# Patient Record
Sex: Female | Born: 1965
Health system: Southern US, Community
[De-identification: ages and names within clinical notes are randomized; demographics above are authoritative.]

## PROBLEM LIST (undated history)

## (undated) DIAGNOSIS — I639 Cerebral infarction, unspecified: Secondary | ICD-10-CM

## (undated) DIAGNOSIS — F419 Anxiety disorder, unspecified: Secondary | ICD-10-CM

## (undated) DIAGNOSIS — G5601 Carpal tunnel syndrome, right upper limb: Secondary | ICD-10-CM

## (undated) DIAGNOSIS — F32A Depression, unspecified: Secondary | ICD-10-CM

## (undated) DIAGNOSIS — G43909 Migraine, unspecified, not intractable, without status migrainosus: Secondary | ICD-10-CM

## (undated) DIAGNOSIS — E039 Hypothyroidism, unspecified: Secondary | ICD-10-CM

## (undated) DIAGNOSIS — F909 Attention-deficit hyperactivity disorder, unspecified type: Secondary | ICD-10-CM

## (undated) DIAGNOSIS — I10 Essential (primary) hypertension: Secondary | ICD-10-CM

## (undated) DIAGNOSIS — M479 Spondylosis, unspecified: Secondary | ICD-10-CM

## (undated) HISTORY — DX: Attention-deficit hyperactivity disorder, unspecified type: F90.9

## (undated) HISTORY — PX: TUBAL LIGATION: SHX77

## (undated) HISTORY — DX: Spondylosis, unspecified: M47.9

## (undated) HISTORY — DX: Anxiety disorder, unspecified: F41.9

## (undated) HISTORY — DX: Carpal tunnel syndrome, right upper limb: G56.01

## (undated) HISTORY — PX: APPENDECTOMY: SHX54

## (undated) HISTORY — DX: Migraine, unspecified, not intractable, without status migrainosus: G43.909

## (undated) HISTORY — DX: Essential (primary) hypertension: I10

## (undated) HISTORY — DX: Depression, unspecified: F32.A

## (undated) HISTORY — DX: Hypothyroidism, unspecified: E03.9

---

## 2006-08-13 ENCOUNTER — Emergency Department (HOSPITAL_COMMUNITY): Admission: EM | Admit: 2006-08-13 | Discharge: 2006-08-13 | Payer: Self-pay | Admitting: Emergency Medicine

## 2006-12-15 ENCOUNTER — Emergency Department (HOSPITAL_COMMUNITY): Admission: EM | Admit: 2006-12-15 | Discharge: 2006-12-15 | Payer: Self-pay | Admitting: Emergency Medicine

## 2008-01-12 ENCOUNTER — Other Ambulatory Visit: Admission: RE | Admit: 2008-01-12 | Discharge: 2008-01-12 | Payer: Self-pay | Admitting: Family Medicine

## 2008-01-21 ENCOUNTER — Encounter: Admission: RE | Admit: 2008-01-21 | Discharge: 2008-01-21 | Payer: Self-pay | Admitting: Family Medicine

## 2009-01-31 ENCOUNTER — Other Ambulatory Visit: Admission: RE | Admit: 2009-01-31 | Discharge: 2009-01-31 | Payer: Self-pay | Admitting: Emergency Medicine

## 2009-02-06 ENCOUNTER — Encounter: Admission: RE | Admit: 2009-02-06 | Discharge: 2009-02-06 | Payer: Self-pay | Admitting: Emergency Medicine

## 2009-02-12 ENCOUNTER — Encounter: Admission: RE | Admit: 2009-02-12 | Discharge: 2009-02-12 | Payer: Self-pay | Admitting: Emergency Medicine

## 2009-05-05 ENCOUNTER — Encounter: Admission: RE | Admit: 2009-05-05 | Discharge: 2009-05-05 | Payer: Self-pay | Admitting: Orthopedic Surgery

## 2009-06-28 ENCOUNTER — Ambulatory Visit (HOSPITAL_BASED_OUTPATIENT_CLINIC_OR_DEPARTMENT_OTHER): Admission: RE | Admit: 2009-06-28 | Discharge: 2009-06-28 | Payer: Self-pay | Admitting: Orthopedic Surgery

## 2010-01-27 ENCOUNTER — Encounter: Payer: Self-pay | Admitting: Emergency Medicine

## 2011-12-17 ENCOUNTER — Other Ambulatory Visit: Payer: Self-pay | Admitting: Family Medicine

## 2011-12-17 DIAGNOSIS — Z1231 Encounter for screening mammogram for malignant neoplasm of breast: Secondary | ICD-10-CM

## 2012-01-21 ENCOUNTER — Ambulatory Visit: Payer: Self-pay

## 2012-01-23 ENCOUNTER — Ambulatory Visit
Admission: RE | Admit: 2012-01-23 | Discharge: 2012-01-23 | Disposition: A | Discharge status: Home / Self Care | Payer: BC Managed Care – PPO | Source: Ambulatory Visit | Attending: Family Medicine | Admitting: Family Medicine

## 2012-01-23 DIAGNOSIS — Z1231 Encounter for screening mammogram for malignant neoplasm of breast: Secondary | ICD-10-CM

## 2012-02-12 ENCOUNTER — Other Ambulatory Visit: Payer: Self-pay | Admitting: Family Medicine

## 2012-02-12 ENCOUNTER — Other Ambulatory Visit (HOSPITAL_COMMUNITY)
Admission: RE | Admit: 2012-02-12 | Discharge: 2012-02-12 | Disposition: A | Discharge status: Home / Self Care | Payer: BC Managed Care – PPO | Source: Ambulatory Visit | Attending: Family Medicine | Admitting: Family Medicine

## 2012-02-12 DIAGNOSIS — Z1151 Encounter for screening for human papillomavirus (HPV): Secondary | ICD-10-CM | POA: Insufficient documentation

## 2012-02-12 DIAGNOSIS — Z124 Encounter for screening for malignant neoplasm of cervix: Secondary | ICD-10-CM | POA: Insufficient documentation

## 2013-05-23 ENCOUNTER — Other Ambulatory Visit: Payer: Self-pay | Admitting: Orthopaedic Surgery

## 2013-05-23 DIAGNOSIS — M545 Low back pain, unspecified: Secondary | ICD-10-CM

## 2013-05-28 ENCOUNTER — Ambulatory Visit
Admission: RE | Admit: 2013-05-28 | Discharge: 2013-05-28 | Disposition: A | Discharge status: Home / Self Care | Payer: BC Managed Care – PPO | Source: Ambulatory Visit | Attending: Orthopaedic Surgery | Admitting: Orthopaedic Surgery

## 2013-05-28 DIAGNOSIS — M545 Low back pain, unspecified: Secondary | ICD-10-CM

## 2013-06-06 ENCOUNTER — Other Ambulatory Visit: Payer: Self-pay | Admitting: Family Medicine

## 2013-06-06 ENCOUNTER — Ambulatory Visit
Admission: RE | Admit: 2013-06-06 | Discharge: 2013-06-06 | Disposition: A | Discharge status: Home / Self Care | Payer: BC Managed Care – PPO | Source: Ambulatory Visit | Attending: Family Medicine | Admitting: Family Medicine

## 2013-06-06 DIAGNOSIS — R109 Unspecified abdominal pain: Secondary | ICD-10-CM

## 2013-06-07 ENCOUNTER — Ambulatory Visit
Admission: RE | Admit: 2013-06-07 | Discharge: 2013-06-07 | Disposition: A | Discharge status: Home / Self Care | Payer: BC Managed Care – PPO | Source: Ambulatory Visit | Attending: Family Medicine | Admitting: Family Medicine

## 2013-06-07 DIAGNOSIS — R109 Unspecified abdominal pain: Secondary | ICD-10-CM

## 2013-06-07 MED ORDER — IOHEXOL 300 MG/ML  SOLN
100.0000 mL | Freq: Once | INTRAMUSCULAR | Status: AC | PRN
  Administered 2013-06-07: 100 mL via INTRAVENOUS

## 2013-08-15 LAB — HM COLONOSCOPY

## 2019-01-07 HISTORY — PX: ANTERIOR CRUCIATE LIGAMENT REPAIR: SHX115

## 2020-02-14 ENCOUNTER — Other Ambulatory Visit (HOSPITAL_COMMUNITY)
Admission: RE | Admit: 2020-02-14 | Discharge: 2020-02-14 | Disposition: A | Discharge status: Home / Self Care | Payer: BC Managed Care – PPO | Source: Ambulatory Visit | Attending: Family Medicine | Admitting: Family Medicine

## 2020-02-14 ENCOUNTER — Other Ambulatory Visit: Payer: Self-pay | Admitting: Family Medicine

## 2020-02-14 DIAGNOSIS — Z01411 Encounter for gynecological examination (general) (routine) with abnormal findings: Secondary | ICD-10-CM | POA: Insufficient documentation

## 2020-02-16 LAB — CYTOLOGY - PAP
Comment: NEGATIVE
Diagnosis: NEGATIVE
High risk HPV: NEGATIVE

## 2020-06-21 NOTE — Progress Notes (Signed)
Electrophysiology Office Note:    Date:  06/22/2020   ID:  Vicki Gonzales, DOB [birth year], MRN [id number]  PCP:  Vicki Bussing, MD  [facility] Cardiologist:  None  [facility] Electrophysiologist:  Lanier Prude, MD   Referring MD: Vicki Bussing, MD   Chief Complaint: Chest pressure  History of Present Illness:    Vicki Gonzales is a 55 y.o. female who presents for an evaluation of chest pressure at the request of Dr. Docia Chuck. Their medical history includes hypertension, ADHD, anxiety, depression.  Patient has a history of remote tobacco abuse that totals 20 pack years.  Her father had a myocardial infarction in his 64s and had what sounds ago defibrillator implanted after his MI.  Her mother has atrial fibrillation.  She tells me that recently she was hiking in [facility].  On the way up the mountain she had no symptoms whatsoever.  When they were descending and heading towards a waterfall it became very steep and she became very anxious.  In that setting she had a flushed feeling all over her body.  She says that she was read.  Unclear whether or not there was extensive chest pain but she does point to her chest when describing the symptoms.  The symptoms eventually resolved.  She is concerned that her symptoms could represent coronary disease given her family history and history of tobacco abuse.  Since the hiking trip she has not had another episode of chest discomfort or feeling red/hot all over.  She has been working on weight loss in an effort to help treat her blood pressure.  She is lost about 10 pounds and her blood pressure has decreased.      Past Medical History:  Diagnosis Date   ADHD    Anxiety    Carpal tunnel syndrome on right    Depression    HTN (hypertension)    Hypothyroidism    Migraines    Spondylosis    Current Medications: Current Meds  Medication Sig   levothyroxine (SYNTHROID) 50 MCG tablet Take 50 mcg by  mouth every morning.   lisinopril-hydrochlorothiazide (ZESTORETIC) 20-12.5 MG tablet Take 1 tablet by mouth daily.   PARoxetine (PAXIL) 10 MG tablet Take 10 mg by mouth daily.     Allergies:   Keflex [cephalexin]   Social History   Socioeconomic History   Marital status: Married    Spouse name: Not on file   Number of children: Not on file   Years of education: Not on file   Highest education level: Not on file  Occupational History   Not on file  Tobacco Use   Smoking status: Former    Pack years: 0.00   Smokeless tobacco: Never  Substance and Sexual Activity   Alcohol use: Not on file   Drug use: Not on file   Sexual activity: Not on file  Other Topics Concern   Not on file  Social History Narrative   Not on file   Social Determinants of Health   Financial Resource Strain: Not on file  Food Insecurity: Not on file  Transportation Needs: Not on file  Physical Activity: Not on file  Stress: Not on file  Social Connections: Not on file     Family History: The patient's family history is not on file.  ROS:   Please see the history of present illness.    All other systems reviewed and are negative.  EKGs/Labs/Other Studies Reviewed:  The following studies were reviewed today:   EKG:  The ekg ordered today demonstrates sinus rhythm.  Normal EKG.  Recent Labs: No results found for requested labs within last 8760 hours.  Recent Lipid Panel No results found for: CHOL, TRIG, HDL, CHOLHDL, VLDL, LDLCALC, LDLDIRECT  Physical Exam:    VS:  BP (!) 132/92   Pulse 73   Ht 5\' 6"  (1.676 m)   Wt 192 lb (87.1 kg)   BMI 30.99 kg/m     Wt Readings from Last 3 Encounters:  06/22/20 192 lb (87.1 kg)     GEN:  Well nourished, well developed in no acute distress HEENT: Normal NECK: No JVD; No carotid bruits LYMPHATICS: No lymphadenopathy CARDIAC: RRR, no murmurs, rubs, gallops RESPIRATORY:  Clear to auscultation without rales, wheezing or rhonchi  ABDOMEN:  Soft, non-tender, non-distended MUSCULOSKELETAL:  No edema; No deformity  SKIN: Warm and dry NEUROLOGIC:  Alert and oriented x 3 PSYCHIATRIC:  Normal affect   ASSESSMENT:    1. Chest pressure   2. History of tobacco abuse   3. Family history of coronary arteriosclerosis    PLAN:    In order of problems listed above:  Chest pressure and abnormal sensation with exertion Unclear if this represents atypical angina but given her family history and personal history of tobacco abuse I think it is reasonable to evaluate for coronary artery disease.  I would like to do a exercise nuclear stress test.  We will get this ordered and plan to follow-up in 4 to 6 weeks with one of the PA/NP's to review the results.  2.  Hypertension Controlled.  Continue lisinopril.  Management per primary care physician.  3.History of tobacco abuse Congratulated her on cessation.  Follow-up 6 to 8 weeks with NP/PA.  Medication Adjustments/Labs and Tests Ordered: Current medicines are reviewed at length with the patient today.  Concerns regarding medicines are outlined above.  Orders Placed This Encounter  Procedures   MYOCARDIAL PERFUSION IMAGING   EKG 12-Lead    No orders of the defined types were placed in this encounter.    Signed, 06/24/20. Rossie Muskrat, MD, [facility], [facility] 06/22/2020 9:19 AM    Electrophysiology [facility]

## 2020-06-22 ENCOUNTER — Other Ambulatory Visit: Payer: Self-pay

## 2020-06-22 ENCOUNTER — Encounter: Payer: Self-pay | Admitting: *Deleted

## 2020-06-22 ENCOUNTER — Ambulatory Visit: Payer: BC Managed Care – PPO | Admitting: Cardiology

## 2020-06-22 VITALS — BP 132/92 | HR 73 | Ht 66.0 in | Wt 192.0 lb

## 2020-06-22 DIAGNOSIS — Z8249 Family history of ischemic heart disease and other diseases of the circulatory system: Secondary | ICD-10-CM | POA: Diagnosis not present

## 2020-06-22 DIAGNOSIS — Z87891 Personal history of nicotine dependence: Secondary | ICD-10-CM | POA: Diagnosis not present

## 2020-06-22 DIAGNOSIS — R0789 Other chest pain: Secondary | ICD-10-CM

## 2020-06-22 NOTE — Patient Instructions (Signed)
Medication Instructions:  No changes *If you need a refill on your cardiac medications before your next appointment, please call your pharmacy*   Lab Work: none If you have labs (blood work) drawn today and your tests are completely normal, you will receive your results only by: MyChart Message (if you have MyChart) OR A paper copy in the mail If you have any lab test that is abnormal or we need to change your treatment, we will call you to review the results.   Testing/Procedures: Your physician has requested that you have en exercise stress myoview. For further information please visit [email or web address] Please follow instruction sheet, as given.  Follow-Up: At [facility], you and your health needs are our priority.  As part of our continuing mission to provide you with exceptional heart care, we have created designated Provider Care Teams.  These Care Teams include your primary Cardiologist (physician) and Advanced Practice Providers (APPs -  Physician Assistants and Nurse Practitioners) who all work together to provide you with the care you need, when you need it.  We recommend signing up for the patient portal called "MyChart".  Sign up information is provided on this After Visit Summary.  MyChart is used to connect with patients for Virtual Visits (Telemedicine).  Patients are able to view lab/test results, encounter notes, upcoming appointments, etc.  Non-urgent messages can be sent to your provider as well.   To learn more about what you can do with MyChart, go to [email or web address].    Your next appointment:   6-8week(s)  The format for your next appointment:   In Person  Provider:   You may see Lanier Prude, MD or one of the following Advanced Practice Providers on your designated Care Team:    Francis Dowse, [place] Casimiro Needle "Mardelle Matte" Lanna Poche, [place]   Other Instructions

## 2020-07-19 ENCOUNTER — Telehealth: Payer: Self-pay

## 2020-07-19 NOTE — Telephone Encounter (Signed)
Contacted the patient, she requested to reschedule her appointment. Her husband had back surgery and she's taking care of him. Her new appointment is August 14, 2020.

## 2020-07-24 ENCOUNTER — Encounter (HOSPITAL_COMMUNITY): Payer: BC Managed Care – PPO

## 2020-08-08 ENCOUNTER — Ambulatory Visit: Payer: BC Managed Care – PPO | Admitting: Student

## 2020-08-08 ENCOUNTER — Telehealth: Payer: Self-pay

## 2020-08-08 NOTE — Telephone Encounter (Signed)
Detailed instructions left on the patient's answering machine. Asked to call back with any questions. S.Carmine Youngberg EMTP 

## 2020-08-12 ENCOUNTER — Other Ambulatory Visit: Payer: Self-pay

## 2020-08-12 ENCOUNTER — Emergency Department (HOSPITAL_COMMUNITY): Payer: BC Managed Care – PPO | Admitting: Certified Registered Nurse Anesthetist

## 2020-08-12 ENCOUNTER — Encounter (HOSPITAL_COMMUNITY): Payer: Self-pay | Admitting: Anesthesiology

## 2020-08-12 ENCOUNTER — Encounter (HOSPITAL_COMMUNITY): Payer: Self-pay | Admitting: Certified Registered Nurse Anesthetist

## 2020-08-12 ENCOUNTER — Emergency Department
Admission: EM | Admit: 2020-08-12 | Discharge: 2020-08-12 | Disposition: A | Discharge status: Other Acute Inpatient Hospital | Payer: BC Managed Care – PPO | Attending: Emergency Medicine | Admitting: Emergency Medicine

## 2020-08-12 ENCOUNTER — Emergency Department: Payer: BC Managed Care – PPO

## 2020-08-12 ENCOUNTER — Inpatient Hospital Stay (HOSPITAL_COMMUNITY): Payer: BC Managed Care – PPO

## 2020-08-12 ENCOUNTER — Other Ambulatory Visit (HOSPITAL_COMMUNITY): Payer: BC Managed Care – PPO

## 2020-08-12 ENCOUNTER — Emergency Department (HOSPITAL_COMMUNITY): Payer: BC Managed Care – PPO

## 2020-08-12 ENCOUNTER — Encounter (HOSPITAL_COMMUNITY)
Admission: EM | Disposition: A | Discharge status: Rehabilitation Facility | Payer: Self-pay | Source: Home / Self Care | Attending: Neurology

## 2020-08-12 ENCOUNTER — Inpatient Hospital Stay (HOSPITAL_COMMUNITY): Admission: RE | Admit: 2020-08-12 | Payer: BC Managed Care – PPO | Source: Ambulatory Visit

## 2020-08-12 ENCOUNTER — Inpatient Hospital Stay (HOSPITAL_COMMUNITY)
Admission: EM | Admit: 2020-08-12 | Discharge: 2020-08-22 | DRG: 023 | Disposition: A | Discharge status: Rehabilitation Facility | Payer: BC Managed Care – PPO | Attending: Neurology | Admitting: Neurology

## 2020-08-12 DIAGNOSIS — I6521 Occlusion and stenosis of right carotid artery: Secondary | ICD-10-CM | POA: Diagnosis not present

## 2020-08-12 DIAGNOSIS — I63511 Cerebral infarction due to unspecified occlusion or stenosis of right middle cerebral artery: Secondary | ICD-10-CM | POA: Diagnosis not present

## 2020-08-12 DIAGNOSIS — I10 Essential (primary) hypertension: Secondary | ICD-10-CM | POA: Diagnosis present

## 2020-08-12 DIAGNOSIS — I361 Nonrheumatic tricuspid (valve) insufficiency: Secondary | ICD-10-CM | POA: Diagnosis not present

## 2020-08-12 DIAGNOSIS — Z87891 Personal history of nicotine dependence: Secondary | ICD-10-CM

## 2020-08-12 DIAGNOSIS — Z79899 Other long term (current) drug therapy: Secondary | ICD-10-CM

## 2020-08-12 DIAGNOSIS — Z9282 Status post administration of tPA (rtPA) in a different facility within the last 24 hours prior to admission to current facility: Secondary | ICD-10-CM | POA: Diagnosis not present

## 2020-08-12 DIAGNOSIS — Z8249 Family history of ischemic heart disease and other diseases of the circulatory system: Secondary | ICD-10-CM

## 2020-08-12 DIAGNOSIS — Z79890 Hormone replacement therapy (postmenopausal): Secondary | ICD-10-CM | POA: Diagnosis not present

## 2020-08-12 DIAGNOSIS — E876 Hypokalemia: Secondary | ICD-10-CM

## 2020-08-12 DIAGNOSIS — I63411 Cerebral infarction due to embolism of right middle cerebral artery: Secondary | ICD-10-CM | POA: Diagnosis present

## 2020-08-12 DIAGNOSIS — I951 Orthostatic hypotension: Secondary | ICD-10-CM | POA: Diagnosis not present

## 2020-08-12 DIAGNOSIS — R29810 Facial weakness: Secondary | ICD-10-CM | POA: Diagnosis present

## 2020-08-12 DIAGNOSIS — I63231 Cerebral infarction due to unspecified occlusion or stenosis of right carotid arteries: Secondary | ICD-10-CM | POA: Diagnosis present

## 2020-08-12 DIAGNOSIS — Z20822 Contact with and (suspected) exposure to covid-19: Secondary | ICD-10-CM | POA: Insufficient documentation

## 2020-08-12 DIAGNOSIS — E7800 Pure hypercholesterolemia, unspecified: Secondary | ICD-10-CM | POA: Diagnosis not present

## 2020-08-12 DIAGNOSIS — R29701 NIHSS score 1: Secondary | ICD-10-CM | POA: Diagnosis present

## 2020-08-12 DIAGNOSIS — R414 Neurologic neglect syndrome: Secondary | ICD-10-CM | POA: Diagnosis present

## 2020-08-12 DIAGNOSIS — I639 Cerebral infarction, unspecified: Secondary | ICD-10-CM | POA: Insufficient documentation

## 2020-08-12 DIAGNOSIS — Q211 Atrial septal defect: Secondary | ICD-10-CM

## 2020-08-12 DIAGNOSIS — T81718A Complication of other artery following a procedure, not elsewhere classified, initial encounter: Secondary | ICD-10-CM | POA: Diagnosis not present

## 2020-08-12 DIAGNOSIS — E785 Hyperlipidemia, unspecified: Secondary | ICD-10-CM | POA: Diagnosis present

## 2020-08-12 DIAGNOSIS — I609 Nontraumatic subarachnoid hemorrhage, unspecified: Secondary | ICD-10-CM | POA: Diagnosis not present

## 2020-08-12 DIAGNOSIS — E039 Hypothyroidism, unspecified: Secondary | ICD-10-CM | POA: Insufficient documentation

## 2020-08-12 DIAGNOSIS — Y92009 Unspecified place in unspecified non-institutional (private) residence as the place of occurrence of the external cause: Secondary | ICD-10-CM

## 2020-08-12 DIAGNOSIS — W06XXXA Fall from bed, initial encounter: Secondary | ICD-10-CM | POA: Diagnosis present

## 2020-08-12 DIAGNOSIS — R29708 NIHSS score 8: Secondary | ICD-10-CM | POA: Diagnosis not present

## 2020-08-12 DIAGNOSIS — I729 Aneurysm of unspecified site: Secondary | ICD-10-CM | POA: Diagnosis not present

## 2020-08-12 DIAGNOSIS — R471 Dysarthria and anarthria: Secondary | ICD-10-CM | POA: Diagnosis present

## 2020-08-12 DIAGNOSIS — E782 Mixed hyperlipidemia: Secondary | ICD-10-CM | POA: Diagnosis not present

## 2020-08-12 DIAGNOSIS — I724 Aneurysm of artery of lower extremity: Secondary | ICD-10-CM | POA: Diagnosis present

## 2020-08-12 DIAGNOSIS — K219 Gastro-esophageal reflux disease without esophagitis: Secondary | ICD-10-CM | POA: Diagnosis present

## 2020-08-12 DIAGNOSIS — Z823 Family history of stroke: Secondary | ICD-10-CM | POA: Diagnosis not present

## 2020-08-12 DIAGNOSIS — Y838 Other surgical procedures as the cause of abnormal reaction of the patient, or of later complication, without mention of misadventure at the time of the procedure: Secondary | ICD-10-CM | POA: Diagnosis not present

## 2020-08-12 DIAGNOSIS — R29710 NIHSS score 10: Secondary | ICD-10-CM | POA: Diagnosis not present

## 2020-08-12 DIAGNOSIS — R29705 NIHSS score 5: Secondary | ICD-10-CM | POA: Diagnosis not present

## 2020-08-12 DIAGNOSIS — Y900 Blood alcohol level of less than 20 mg/100 ml: Secondary | ICD-10-CM | POA: Diagnosis not present

## 2020-08-12 DIAGNOSIS — Z881 Allergy status to other antibiotic agents status: Secondary | ICD-10-CM | POA: Diagnosis not present

## 2020-08-12 DIAGNOSIS — Z9889 Other specified postprocedural states: Secondary | ICD-10-CM | POA: Diagnosis not present

## 2020-08-12 DIAGNOSIS — G8194 Hemiplegia, unspecified affecting left nondominant side: Secondary | ICD-10-CM | POA: Diagnosis present

## 2020-08-12 DIAGNOSIS — Q2112 Patent foramen ovale: Secondary | ICD-10-CM

## 2020-08-12 DIAGNOSIS — T82838A Hemorrhage of vascular prosthetic devices, implants and grafts, initial encounter: Secondary | ICD-10-CM | POA: Diagnosis not present

## 2020-08-12 DIAGNOSIS — G4733 Obstructive sleep apnea (adult) (pediatric): Secondary | ICD-10-CM | POA: Diagnosis not present

## 2020-08-12 LAB — COMPREHENSIVE METABOLIC PANEL
ALT: 22 U/L (ref 0–44)
AST: 21 U/L (ref 15–41)
Albumin: 4.3 g/dL (ref 3.5–5.0)
Alkaline Phosphatase: 70 U/L (ref 38–126)
Anion gap: 10 (ref 5–15)
BUN: 16 mg/dL (ref 6–20)
CO2: 27 mmol/L (ref 22–32)
Calcium: 9.4 mg/dL (ref 8.9–10.3)
Chloride: 99 mmol/L (ref 98–111)
Creatinine, Ser: 0.62 mg/dL (ref 0.44–1.00)
GFR, Estimated: >60 mL/min (ref >60)
Glucose, Bld: 107 mg/dL — ABNORMAL HIGH (ref 70–99)
Potassium: 3.8 mmol/L (ref 3.5–5.1)
Sodium: 136 mmol/L (ref 135–145)
Total Bilirubin: 1.3 mg/dL — ABNORMAL HIGH (ref 0.3–1.2)
Total Protein: 7.7 g/dL (ref 6.5–8.1)

## 2020-08-12 LAB — APTT: aPTT: 26 seconds (ref 24–36)

## 2020-08-12 LAB — URINE DRUG SCREEN, QUALITATIVE (ARMC ONLY)
Amphetamines, Ur Screen: NOT DETECTED
Barbiturates, Ur Screen: NOT DETECTED
Benzodiazepine, Ur Scrn: NOT DETECTED
Cannabinoid 50 Ng, Ur ~~LOC~~: NOT DETECTED
Cocaine Metabolite,Ur ~~LOC~~: NOT DETECTED
MDMA (Ecstasy)Ur Screen: NOT DETECTED
Methadone Scn, Ur: NOT DETECTED
Opiate, Ur Screen: NOT DETECTED
Phencyclidine (PCP) Ur S: NOT DETECTED
Tricyclic, Ur Screen: NOT DETECTED

## 2020-08-12 LAB — LIPID PANEL
Cholesterol: 253 mg/dL — ABNORMAL HIGH (ref 0–200)
HDL: 38 mg/dL — ABNORMAL LOW (ref >40)
LDL Cholesterol: 172 mg/dL — ABNORMAL HIGH (ref 0–99)
Total CHOL/HDL Ratio: 6.7 RATIO
Triglycerides: 217 mg/dL — ABNORMAL HIGH (ref <150)
VLDL: 43 mg/dL — ABNORMAL HIGH (ref 0–40)

## 2020-08-12 LAB — DIFFERENTIAL
Abs Immature Granulocytes: 0.03 10*3/uL (ref 0.00–0.07)
Basophils Absolute: 0.1 10*3/uL (ref 0.0–0.1)
Basophils Relative: 0 %
Eosinophils Absolute: 0.1 10*3/uL (ref 0.0–0.5)
Eosinophils Relative: 1 %
Immature Granulocytes: 0 %
Lymphocytes Relative: 19 %
Lymphs Abs: 2.2 10*3/uL (ref 0.7–4.0)
Monocytes Absolute: 0.7 10*3/uL (ref 0.1–1.0)
Monocytes Relative: 6 %
Neutro Abs: 8.8 10*3/uL — ABNORMAL HIGH (ref 1.7–7.7)
Neutrophils Relative %: 74 %

## 2020-08-12 LAB — URINALYSIS, ROUTINE W REFLEX MICROSCOPIC
Bilirubin Urine: NEGATIVE
Glucose, UA: NEGATIVE mg/dL
Hgb urine dipstick: NEGATIVE
Ketones, ur: NEGATIVE mg/dL
Leukocytes,Ua: NEGATIVE
Nitrite: NEGATIVE
Protein, ur: NEGATIVE mg/dL
Specific Gravity, Urine: 1.027 (ref 1.005–1.030)
pH: 7 (ref 5.0–8.0)

## 2020-08-12 LAB — RESP PANEL BY RT-PCR (FLU A&B, COVID) ARPGX2
Influenza A by PCR: NEGATIVE
Influenza B by PCR: NEGATIVE
SARS Coronavirus 2 by RT PCR: NEGATIVE

## 2020-08-12 LAB — FIBRINOGEN: Fibrinogen: 248 mg/dL (ref 210–475)

## 2020-08-12 LAB — CBC
HCT: 41.6 % (ref 36.0–46.0)
Hemoglobin: 13.9 g/dL (ref 12.0–15.0)
MCH: 29.1 pg (ref 26.0–34.0)
MCHC: 33.4 g/dL (ref 30.0–36.0)
MCV: 87 fL (ref 80.0–100.0)
Platelets: 336 10*3/uL (ref 150–400)
RBC: 4.78 MIL/uL (ref 3.87–5.11)
RDW: 14.2 % (ref 11.5–15.5)
WBC: 11.9 10*3/uL — ABNORMAL HIGH (ref 4.0–10.5)
nRBC: 0 % (ref 0.0–0.2)

## 2020-08-12 LAB — HEMOGLOBIN A1C
Hgb A1c MFr Bld: 6.2 % — ABNORMAL HIGH (ref 4.8–5.6)
Mean Plasma Glucose: 131.24 mg/dL

## 2020-08-12 LAB — PROTIME-INR
INR: 1 (ref 0.8–1.2)
Prothrombin Time: 13.6 seconds (ref 11.4–15.2)

## 2020-08-12 LAB — ETHANOL: Alcohol, Ethyl (B): <10 mg/dL (ref <10)

## 2020-08-12 LAB — HIV ANTIBODY (ROUTINE TESTING W REFLEX): HIV Screen 4th Generation wRfx: NONREACTIVE

## 2020-08-12 SURGERY — IR WITH ANESTHESIA
Anesthesia: General

## 2020-08-12 MED ORDER — ACETAMINOPHEN 650 MG RE SUPP: 650.0000 mg | RECTAL | Status: DC | PRN

## 2020-08-12 MED ORDER — LABETALOL HCL 5 MG/ML IV SOLN
20.0000 mg | Freq: Once | INTRAVENOUS | Status: AC
  Administered 2020-08-12: 20 mg via INTRAVENOUS

## 2020-08-12 MED ORDER — ALTEPLASE (STROKE) FULL DOSE INFUSION
0.9000 mg/kg | Freq: Once | INTRAVENOUS | Status: DC
  Filled 2020-08-12: qty 100

## 2020-08-12 MED ORDER — CLEVIDIPINE BUTYRATE 0.5 MG/ML IV EMUL
0.0000 mg/h | INTRAVENOUS | Status: DC
  Administered 2020-08-12: 1 mg/h via INTRAVENOUS
  Administered 2020-08-12: 3 mg/h via INTRAVENOUS
  Filled 2020-08-12: qty 50

## 2020-08-12 MED ORDER — SODIUM CHLORIDE 0.9 % IV SOLN: Freq: Once | INTRAVENOUS | Status: DC

## 2020-08-12 MED ORDER — IOHEXOL 300 MG/ML  SOLN: 50.0000 mL | Freq: Once | INTRAMUSCULAR | Status: DC | PRN

## 2020-08-12 MED ORDER — IOHEXOL 350 MG/ML SOLN
100.0000 mL | Freq: Once | INTRAVENOUS | Status: AC | PRN
  Administered 2020-08-12: 100 mL via INTRAVENOUS

## 2020-08-12 MED ORDER — SODIUM CHLORIDE 0.9 % IV SOLN: 50.0000 mL | Freq: Once | INTRAVENOUS | Status: DC

## 2020-08-12 MED ORDER — SENNOSIDES-DOCUSATE SODIUM 8.6-50 MG PO TABS
1.0000 | ORAL_TABLET | Freq: Two times a day (BID) | ORAL | Status: DC
  Administered 2020-08-15 – 2020-08-22 (×9): 1 via ORAL
  Filled 2020-08-12 (×12): qty 1

## 2020-08-12 MED ORDER — ALTEPLASE 100 MG IV SOLR
INTRAVENOUS | Status: AC
  Filled 2020-08-12: qty 100

## 2020-08-12 MED ORDER — ACETAMINOPHEN 160 MG/5ML PO SOLN: 650.0000 mg | ORAL | Status: DC | PRN

## 2020-08-12 MED ORDER — PAROXETINE HCL 10 MG PO TABS
10.0000 mg | ORAL_TABLET | Freq: Every day | ORAL | Status: DC
  Administered 2020-08-13 – 2020-08-22 (×7): 10 mg via ORAL
  Filled 2020-08-12 (×10): qty 1

## 2020-08-12 MED ORDER — STROKE: EARLY STAGES OF RECOVERY BOOK
Freq: Once | Status: DC
  Filled 2020-08-12: qty 1

## 2020-08-12 MED ORDER — ACETAMINOPHEN 325 MG PO TABS
650.0000 mg | ORAL_TABLET | ORAL | Status: DC | PRN
  Filled 2020-08-12: qty 2

## 2020-08-12 MED ORDER — LEVOTHYROXINE SODIUM 50 MCG PO TABS
50.0000 ug | ORAL_TABLET | Freq: Every day | ORAL | Status: DC
  Administered 2020-08-13 – 2020-08-22 (×10): 50 ug via ORAL
  Filled 2020-08-12 (×10): qty 1

## 2020-08-12 MED ORDER — PANTOPRAZOLE SODIUM 40 MG IV SOLR
40.0000 mg | Freq: Every day | INTRAVENOUS | Status: DC
  Administered 2020-08-12: 40 mg via INTRAVENOUS
  Filled 2020-08-12: qty 40

## 2020-08-12 NOTE — Anesthesia Preprocedure Evaluation (Signed)
Anesthesia Evaluation  Patient identified by MRN, date of birth, ID band Patient awake    Reviewed: Allergy & Precautions, H&P , NPO status , Patient's Chart, lab work & pertinent test results  Airway        Dental no notable dental hx.    Pulmonary neg pulmonary ROS, former smoker,    Pulmonary exam normal        Cardiovascular hypertension, Pt. on medications      Neuro/Psych  Headaches, Anxiety Depression CVA, Residual Symptoms    GI/Hepatic negative GI ROS, Neg liver ROS,   Endo/Other  Hypothyroidism   Renal/GU negative Renal ROS  negative genitourinary   Musculoskeletal  (+) Arthritis ,   Abdominal   Peds  Hematology negative hematology ROS (+)   Anesthesia Other Findings   Reproductive/Obstetrics negative OB ROS                             Anesthesia Physical Anesthesia Plan  ASA: 3 and emergent  Anesthesia Plan: General   Post-op Pain Management:    Induction: Intravenous, Rapid sequence and Cricoid pressure planned  PONV Risk Score and Plan: 3 and Ondansetron, Dexamethasone and Treatment may vary due to age or medical condition  Airway Management Planned: Oral ETT  Additional Equipment: Arterial line  Intra-op Plan:   Post-operative Plan: Possible Post-op intubation/ventilation and Extubation in OR  Informed Consent: I have reviewed the patients History and Physical, chart, labs and discussed the procedure including the risks, benefits and alternatives for the proposed anesthesia with the patient or authorized representative who has indicated his/her understanding and acceptance.     Dental advisory given  Plan Discussed with: CRNA  Anesthesia Plan Comments:         Anesthesia Quick Evaluation

## 2020-08-12 NOTE — Consult Note (Signed)
NEUROLOGY CONSULTATION NOTE   Date of service: August 12, 2020 Patient Name: Vicki Gonzales MRN:  [id number] DOB:  [birth year] Reason for consult: stroke code Requesting attending: Augusto Gamble MD _ _ _   _ __   _ __ _ _  __ __   _ __   __ _  History of Present Illness    55 yo woman with pmhx HTN, depression, migraines hypothyroidism BIB EMS after developing acute osnet of L facial droop and slurred speech at 0900 this AM. On my examination slurred speech had resolved and she had only L NLF flattening for NIHSS = 0. tPA was initially not administered 2/2 low NIHSS. CT head wo contrast showed hypodensity R insula and frontal operculum, ASPECTS 8, with equivocal hyperdensity of the R M1. CTA was performed which showed prox R ICA occlusion with intracranial reconstitution, finding suspicious for subocclusive embolus vs severe stenosis at the R MCA bifurcation, occlusion of L vert at origin with distal reconstitution, and CTP showing 25cc penumbra R MCA territory.  Based on CTA/CTP findings I called Dr. Corliss Skains who recommended administration of tPA and activation of Code IR. I discussed the risks/benefits of both tPA and thrombectomy with the patient, including 10% risk of hemorrhage with the latter, and she gave informed consent. She had no exclusion criteria for tPA. She did have a slip out of bed this morning and bumped her head on the dresser lightly but there was no LOC or other sx and no blood on CT head. Patient is not on anticoagulation.  ROS   Per HPI; all other systems reviewed and were negative  Past History   Past Medical History:  Diagnosis Date  . ADHD   . Anxiety   . Carpal tunnel syndrome on right   . Depression   . HTN (hypertension)   . Hypothyroidism   . Migraines   . Spondylosis     Social History   Socioeconomic History  . Marital status: Married    Spouse name: Not on file  . Number of children: Not on file  . Years of education: Not on file  .  Highest education level: Not on file  Occupational History  . Not on file  Tobacco Use  . Smoking status: Former  . Smokeless tobacco: Never  Substance and Sexual Activity  . Alcohol use: Not on file  . Drug use: Not on file  . Sexual activity: Not on file  Other Topics Concern  . Not on file  Social History Narrative  . Not on file   Social Determinants of Health   Financial Resource Strain: Not on file  Food Insecurity: Not on file  Transportation Needs: Not on file  Physical Activity: Not on file  Stress: Not on file  Social Connections: Not on file   Allergies  Allergen Reactions  . Keflex [Cephalexin]     Medications    Current Facility-Administered Medications:  .  alteplase (ACTIVASE) 1 mg/mL infusion SOLN 76.1 mg, 0.9 mg/kg, Intravenous, Once **FOLLOWED BY** 0.9 %  sodium chloride infusion, 50 mL, Intravenous, Once, Jefferson Fuel, MD  Current Outpatient Medications:  .  levothyroxine (SYNTHROID) 50 MCG tablet, Take 50 mcg by mouth every morning., Disp: , Rfl:  .  lisinopril-hydrochlorothiazide (ZESTORETIC) 20-12.5 MG tablet, Take 1 tablet by mouth daily., Disp: , Rfl:  .  PARoxetine (PAXIL) 10 MG tablet, Take 10 mg by mouth daily., Disp: , Rfl:      Vitals   Vitals:  08/12/20 1126 08/12/20 1157 08/12/20 1236 08/12/20 1244  BP: (!) 143/76 (!) 160/85  (!) 168/74  Pulse: 85 65  68  Resp: 18 18 18 20   Temp: 98.3 F (36.8 C) 98.4 F (36.9 C)    TempSrc: Oral Oral    SpO2: 99% 100%  99%     There is no height or weight on file to calculate BMI.  Physical Exam   Physical Exam Gen: A&O x4, NAD HEENT: Atraumatic, normocephalic;mucous membranes moist; oropharynx clear, tongue without atrophy or fasciculations. Neck: Supple, trachea midline. Resp: CTAB, no w/r/r CV: RRR, no m/g/r; nml S1 and S2. 2+ symmetric peripheral pulses. Abd: soft/NT/ND; nabs x 4 quad Extrem: Nml bulk; no cyanosis, clubbing, or edema.  Neuro: *MS: A&O x4. Follows multi-step  commands.  *Speech: fluid, nondysarthric, able to name and repeat *CN:    I: Deferred   II,III: PERRLA, VFF by confrontation, optic discs sharp   III,IV,VI: EOMI w/o nystagmus, no ptosis   V: Sensation intact from V1 to V3 to LT   VII: Eyelid closure was full.  L NLF flattening.   VIII: Hearing intact to voice   IX,X: Voice normal, palate elevates symmetrically    XI: SCM/trap 5/5 bilat   XII: Tongue protrudes midline, no atrophy or fasciculations   *Motor:   Normal bulk.  No tremor, rigidity or bradykinesia. No pronator drift.    Strength: Dlt Bic Tri WrE WrF FgS Gr HF KnF KnE PlF DoF    Left 5 5 5 5 5 5 5 5 5 5 5 5     Right 5 5 5 5 5 5 5 5 5 5 5 5     *Sensory: Intact to light touch, pinprick, temperature vibration throughout. Symmetric. Propioception intact bilat.  No double-simultaneous extinction.  *Coordination:  Finger-to-nose, heel-to-shin, rapid alternating motions were intact. *Reflexes:  2+ and symmetric throughout without clonus; toes down-going bilat *Gait: deferred  NIHSS = 1 for facial droop   Premorbid mRS = 0   Labs   CBC:  Recent Labs  Lab 08/12/20 1148  WBC 11.9*  NEUTROABS 8.8*  HGB 13.9  HCT 41.6  MCV 87.0  PLT 336    Basic Metabolic Panel:  Lab Results  Component Value Date   NA 136 08/12/2020   K 3.8 08/12/2020   CO2 27 08/12/2020   GLUCOSE 107 (H) 08/12/2020   BUN 16 08/12/2020   CREATININE 0.62 08/12/2020   CALCIUM 9.4 08/12/2020   GFRNONAA >60 08/12/2020   Lipid Panel: No results found for: LDLCALC HgbA1c: No results found for: HGBA1C Urine Drug Screen: No results found for: LABOPIA, COCAINSCRNUR, LABBENZ, AMPHETMU, THCU, LABBARB  Alcohol Level     Component Value Date/Time   ETH <10 08/12/2020 1148     Impression   55 yo woman with pmhx HTN, depression, migraines hypothyroidism BIB EMS after developing acute osnet of L facial droop and slurred speech at 0900 this AM. NIHSS = 1 but CTA showed R ICA occlusion and  subocclusive embolus R MCA bifurcation with 25cc penumbra. After discussion with Dr. 10/12/2020 and receiving patient's informed consent, tPA was administered, Code IR activated, and patient transferred to [facility] for emergent mechanical thrombectomy.   Recommendations   - tPA currently being infused - Patient en route to [facility] as code IR for emergent thrombectomy - Further mgmt per IR team and admitting [facility] Neurohospitalist ______________________________________________________________________   Thank you for the opportunity to take part in the care of this patient. If you  have any further questions, please contact the neurology consultation attending.  Signed,  Bing Neighbors, MD [facility] [phone]  If 7pm- 7am, please page neurology on call as listed in AMION.

## 2020-08-12 NOTE — ED Provider Notes (Signed)
[facility]  ____________________________________________   Event Date/Time   First MD Initiated Contact with Patient 08/12/20 1141     (approximate)  I have reviewed the triage vital signs and the nursing notes.   HISTORY  Chief Complaint Fall, Head Injury, and Code Stroke    HPI Vicki Gonzales is a 55 y.o. female with past medical history of hypothyroidism, hypertension, anxiety, depression who presents with left-sided facial droop.  Patient was in her normal state of health.  Had a fall at home hit the back of her head.  Initially felt fine and then around 9:30 AM noticed a left-sided facial droop.         Past Medical History:  Diagnosis Date   ADHD    Anxiety    Carpal tunnel syndrome on right    Depression    HTN (hypertension)    Hypothyroidism    Migraines    Spondylosis     There are no problems to display for this patient.     Prior to Admission medications   Medication Sig Start Date End Date Taking? Authorizing Provider  levothyroxine (SYNTHROID) 50 MCG tablet Take 50 mcg by mouth every morning. 05/10/20   [provider]  lisinopril-hydrochlorothiazide (ZESTORETIC) 20-12.5 MG tablet Take 1 tablet by mouth daily. 06/18/20   [provider]  PARoxetine (PAXIL) 10 MG tablet Take 10 mg by mouth daily. 05/04/20   [provider]    Allergies Keflex [cephalexin]  No family history on file.  Social History Social History   Tobacco Use   Smoking status: Former   Smokeless tobacco: Never    Review of Systems   Review of Systems  Constitutional:  Negative for chills and fever.  Eyes:  Positive for pain.  Respiratory:  Negative for shortness of breath.   Cardiovascular:  Negative for chest pain.  Neurological:  Positive for facial asymmetry, numbness and headaches.  All other systems reviewed and are negative.  Physical Exam Updated Vital Signs BP (!) 168/101 (BP Location: Right Arm)    Pulse 69   Temp 98.4 F (36.9 C) (Oral)   Resp (!) 22   Wt 84.5 kg   SpO2 100%   BMI 30.07 kg/m   Physical Exam Vitals and nursing note reviewed.  Constitutional:      General: She is not in acute distress.    Appearance: Normal appearance.  HENT:     Head: Normocephalic and atraumatic.  Eyes:     General: No scleral icterus.    Conjunctiva/sclera: Conjunctivae normal.  Pulmonary:     Effort: Pulmonary effort is normal. No respiratory distress.     Breath sounds: No stridor.  Musculoskeletal:        General: No deformity or signs of injury.     Cervical back: Normal range of motion.  Skin:    General: Skin is dry.     Coloration: Skin is not jaundiced or pale.  Neurological:     Mental Status: She is alert and oriented to person, place, and time. Mental status is at baseline.     Comments: + Facial asymmetry, left-sided facial droop prominent at the nasolabial fold 5 out of 5 strength in the bilateral upper and lower extremities Sensation is grossly intact in the bilateral upper and lower extremities Finger-nose-finger intact   Psychiatric:        Mood and Affect: Mood normal.        Behavior: Behavior normal.  LABS (all labs ordered are listed, but only abnormal results are displayed)  Labs Reviewed  CBC - Abnormal; Notable for the following components:      Result Value   WBC 11.9 (*)    All other components within normal limits  DIFFERENTIAL - Abnormal; Notable for the following components:   Neutro Abs 8.8 (*)    All other components within normal limits  COMPREHENSIVE METABOLIC PANEL - Abnormal; Notable for the following components:   Glucose, Bld 107 (*)    Total Bilirubin 1.3 (*)    All other components within normal limits  ETHANOL  PROTIME-INR  APTT  URINE DRUG SCREEN, QUALITATIVE ([facility] ONLY)  URINALYSIS, ROUTINE W REFLEX MICROSCOPIC  POC URINE PREG, ED   ____________________________________________  EKG  Normal rate, normal rhythm, no  ischemic changes, normal axis ____________________________________________  RADIOLOGY I, Randol Kern, personally viewed and evaluated these images (plain radiographs) as part of my medical decision making, as well as reviewing the written report by the radiologist.  ED MD interpretation: CT head, and CT angio interpreted by radiology    ____________________________________________   PROCEDURES  Procedure(s) performed (including Critical Care):  .Critical Care  Date/Time: 08/12/2020 3:57 PM Performed by: Georga Hacking, MD Authorized by: Georga Hacking, MD   Critical care provider statement:    Critical care time (minutes):  30   Critical care was necessary to treat or prevent imminent or life-threatening deterioration of the following conditions:  CNS failure or compromise   Critical care was time spent personally by me on the following activities:  Discussions with consultants, examination of patient, ordering and performing treatments and interventions, ordering and review of laboratory studies, ordering and review of radiographic studies and re-evaluation of patient's condition   I assumed direction of critical care for this patient from another provider in my specialty: no     ____________________________________________   INITIAL IMPRESSION / ASSESSMENT AND PLAN / ED COURSE     The patient is a 55 year old female who presents with a left-sided facial droop, started at 9:30 AM.  Stroke alert was called in triage and patient was taken to CT.  I evaluated the patient alongside neurologist Dr. Selina Cooley.  Patient has a left facial droop but no obvious extremity weakness or aphasia.  Her head CT is concerning for a dense MCA so she was taken immediately back for CT angio which demonstrates a right ICA occlusion as well as a occlusion at the MCA bifurcation, embolus versus stenosis.  Patient was consented for tPA by Dr. Selina Cooley as well as for IR procedure.  She remained  stable with blood pressures less than 180 systolic in the ED.  She will be transferred to [facility] for thrombectomy.  Clinical Course as of 08/12/20 1555  Sun Aug 12, 2020  1147 Basic metabolic panel [KM]  1149 IMPRESSION: 1. Acute infarct in the right insula and frontal operculum, ASPECTS is 8. 2.  Equivocal hyperdensity of the right M1. 3. No acute hemorrhage.   [KM]    Clinical Course User Index [KM] Georga Hacking, MD     ____________________________________________   FINAL CLINICAL IMPRESSION(S) / ED DIAGNOSES  Final diagnoses:  Cerebrovascular accident (CVA), unspecified mechanism [facility])     ED Discharge Orders     None        Note:  This document was prepared using Dragon voice recognition software and may include unintentional dictation errors.    Georga Hacking, MD 08/12/20 [phone]

## 2020-08-12 NOTE — Consult Note (Addendum)
CODE STROKE- PHARMACY COMMUNICATION   Time CODE STROKE called/page received:1131  Time response to CODE STROKE was made (in person or via phone): 1140  Time Stroke Kit retrieved from [place] (only if needed): No TPA given  ADDENDUM - decision later reversed by Dr Quinn Axe and TPA given - @ 1306  Total dose 76.1 mg based on a weight of 84.5kg - 23.22m removed from vial of 1046mafter mixed with a bolus of 7.6m70mollowed by 69.2ml6mer 1 hour  Name of Provider/Nurse contacted: Dr. StacQuinn Axest Medical History:  Diagnosis Date   ADHD    Anxiety    Carpal tunnel syndrome on right    Depression    HTN (hypertension)    Hypothyroidism    Migraines    Spondylosis    Prior to Admission medications   Medication Sig Start Date End Date Taking? Authorizing Provider  levothyroxine (SYNTHROID) 50 MCG tablet Take 50 mcg by mouth every morning. 05/10/20   [provider]  lisinopril-hydrochlorothiazide (ZESTORETIC) 20-12.5 MG tablet Take 1 tablet by mouth daily. 06/18/20   [provider]  PARoxetine (PAXIL) 10 MG tablet Take 10 mg by mouth daily. 05/04/20   [provider]   BranNarda RutherfordarmD Pharmacy Resident  08/12/2020 11:59 AM CharLu DuffelarmD, BCPS Clinical Pharmacist 08/12/2020 1:14 PM

## 2020-08-12 NOTE — Progress Notes (Signed)
Spoke with Dr. Amada Jupiter, ok to get follow up CT scan around 2300 since the previous scan was performed around 1800 and was stable.

## 2020-08-12 NOTE — ED Notes (Signed)
Called [facility] in [place] and activated Code IR per Dr. Selina Cooley  1248

## 2020-08-12 NOTE — Progress Notes (Signed)
Patient ID: Annarae Macnair, female   DOB: 12-22-1965, 55 y.o.   MRN: [id number] INR 54 Y RT H F MRS 0  LSW 9am   New onset Lt facial droop.and slurred speech.  CT Brain No ICH . ASPECTS 8 and ? Rt MCA hyperdense sign. CTA brain  RT MCA  Smooth near occlusive filling defect ?thrombus at Rt MCA bifurcation ,and near complete occlusion of Rt ICA prox with ?string sign. Patient given IV TPA . Repeat CT brain obtained due to severe new onset H/A with BP .>200 systolic.  Repeat CT suspicious for focal /gyriform hyper attenuation in the RT frontal subarachnoid space,and hypointense on  MRI  gradient recall sequence. D/W  Dr Derry Lory . Will defer endovascular treatment for now unless the patients neuro status worsens. Will address prox Rt ICA near complete stenosis with string sign in the next  2 to3 days. Will follow. S.Charleene Callegari MD

## 2020-08-12 NOTE — Code Documentation (Addendum)
Stroke Response Nurse Documentation Code Documentation  Sacheen Arrasmith Tuyen Uncapher is a 55 y.o. female arriving to [facility] from [facility] for endovascular treatment. Upon arrival to [facility] pt complained of 8/10 pain behind her right eye. BP on arrival 208/110. PERRLA, 21mm. NIHSS notable for a left facial droop. TPA flush infusing on arrival. TPA flush stopped at 1405, per neurologist, and pt transported to CT scan with team. 20mg  IV Labetalol given for BP. CT scan completed and reviewed by neurologist, decision was made to obtain an MRI. Cleviprex gtt started for BP management. Following MRI, decision was made to defer endovascular treatment at this time. BP goal 140/100, per MD. ICU admission ordered and pt transported to 4N26. Family updated by Dr. and escorted to bedside.  Hand off report with Amy, RN.   Hadasah Brugger L Jiovany Scheffel  Rapid Response RN

## 2020-08-12 NOTE — ED Notes (Signed)
Pt back from CTA

## 2020-08-12 NOTE — ED Notes (Signed)
Called [facility] for Emergency Transport to IR at [facility] 1309

## 2020-08-12 NOTE — ED Notes (Signed)
Arrived in CT

## 2020-08-12 NOTE — ED Notes (Addendum)
Pt states she had a fall off her bed at 0700 this am, struck head L temporal area, developed R occipital HA. States she woke normal, and then went to church at 0900. Shortly after 0900, daughter in law noticed L face droop and slurred speech. No thinners reported, code stroke called

## 2020-08-12 NOTE — H&P (Signed)
NEUROLOGY CONSULTATION NOTE   Date of service: August 12, 2020 Patient Name: Vicki Gonzales MRN:  [id number] DOB:  [birth year] _ _ _   _ __   _ __ _ _  __ __   _ __   __ _  History of Present Illness  Vicki Gonzales is a 55 y.o. female with PMH significant for HTN, hypothyroidism, migraines, spondylosis, who presented to [facility] ED with dysarthric speech and a left facial droop and she was found to have a concern for right MCA stroke on CT head without contrast.  Her aspect score was an 8 with hypodensity in the right insula in the frontal curriculum.  CTA demonstrate a proximal ICA occlusion with distal intracranial reconstitution and ?  Subocclusive right MCA thrombus at the bifurcation with CT perfusion demonstrating a 25 cc penumbra in the right MCA territory.  Given her symptoms and concern for progression of her symptoms to a full-blown right MCA stroke, tPA was administered and the case was discussed with interventionalist team and a code IR was activated.  She was transferred to [facility] for potential thrombectomy and angioplasty.  On arrival to [facility], she was noted to have elevated blood pressure to 208 systolic by 110 diastolic per EMS.  Her blood pressure was not treated by EMS enroute. Initial plan was to evaluate her and take her to thrombectomy suite directly.  However, she endorsed a right frontal throbbing headache behind her right eye with some right occipital headache which she describes as an 8 out of 10 that started sometimes after tPA was administered.  She has never had a headache like this in the past.  So instead of taking her to IR, a stat CT head was obtained which demonstrated new small superficial densities in the right frontal lobe and they were indeterminate for trace subarachnoid hemorrhage versus cortical contrast staining versus cortical petechial hemorrhage.  MRI brain gradient echo sequences were immediately obtained to clarify this  further and notable for a positive small volume subarachnoid hemorrhage.  We decided to hold off on thrombectomy at this time.  Our primary concern was that opening of the blood vessel at this time could worsen the hemorrhage.  She was admitted to the ICU with blood pressure kept less than 140/100.  We decided to hold off on reversing tPA given the concern for subocclusive right MCA bifurcation thrombus.  We will get a stat fibrinogen level and have cryoprecipitate prepared by the blood bank however none will be transfused at this time.  We will instead get CT head every hour for 2 hours to ensure stability of the noted subarachnoid hemorrhage.  If the bleed worsens on imaging or she has clinical findings concerning for worsening of the bleed, we will immediately reverse the tPA.   mRS: 0 NIHSS components Score: Comment  1a Level of Conscious 0[x]  1[]  2[]  3[]      1b LOC Questions 0[x]  1[]  2[]       1c LOC Commands 0[x]  1[]  2[]       2 Best Gaze 0[x]  1[]  2[]       3 Visual 0[x]  1[]  2[]  3[]      4 Facial Palsy 0[]  1[x]  2[]  3[]      5a Motor Arm - left 0[x]  1[]  2[]  3[]  4[]  UN[]    5b Motor Arm - Right 0[x]  1[]  2[]  3[]  4[]  UN[]    6a Motor Leg - Left 0[x]  1[]  2[]  3[]  4[]  UN[]    6b Motor Leg -  Right 0[x]  1[]  2[]  3[]  4[]  UN[]    7 Limb Ataxia 0[x]  1[]  2[]  3[]  UN[]     8 Sensory 0[x]  1[]  2[]  UN[]      9 Best Language 0[x]  1[]  2[]  3[]      10 Dysarthria 0[x]  1[]  2[]  UN[]      11 Extinct. and Inattention 0[x]  1[]  2[]       TOTAL: 1   tPA: yes, given at [facility] Thrombectomy: aborted when she presented at [facility] due to small volume SAH.   ROS   Constitutional Denies weight loss, fever and chills.   HEENT Denies changes in vision and hearing.   Respiratory Denies SOB and cough.   CV Denies palpitations and CP   GI Denies abdominal pain, nausea, vomiting and diarrhea.   GU Denies dysuria and urinary frequency.   MSK Denies myalgia and joint pain.   Skin Denies rash and pruritus.   Neurological Headaches  as above, no syncope.   Psychiatric Denies recent changes in mood. Denies anxiety and depression.    Past History   Past Medical History:  Diagnosis Date  . ADHD   . Anxiety   . Carpal tunnel syndrome on right   . Depression   . HTN (hypertension)   . Hypothyroidism   . Migraines   . Spondylosis    History reviewed. No pertinent surgical history. History reviewed. No pertinent family history. Social History   Socioeconomic History  . Marital status: Married    Spouse name: Not on file  . Number of children: Not on file  . Years of education: Not on file  . Highest education level: Not on file  Occupational History  . Not on file  Tobacco Use  . Smoking status: Former  . Smokeless tobacco: Never  Substance and Sexual Activity  . Alcohol use: Not on file  . Drug use: Not on file  . Sexual activity: Not on file  Other Topics Concern  . Not on file  Social History Narrative  . Not on file   Social Determinants of Health   Financial Resource Strain: Not on file  Food Insecurity: Not on file  Transportation Needs: Not on file  Physical Activity: Not on file  Stress: Not on file  Social Connections: Not on file   Allergies  Allergen Reactions  . Keflex [Cephalexin]     Medications  (Not in a hospital admission)    Vitals   Vitals:   08/12/20 1500 08/12/20 1510 08/12/20 1515 08/12/20 1530  BP: (!) 143/80 (!) 154/67 134/72 (!) 141/66  Pulse:         There is no height or weight on file to calculate BMI.  Physical Exam   General: Laying comfortably in bed; in no acute distress.  HENT: Normal oropharynx and mucosa. Normal external appearance of ears and nose.  Neck: Supple, no pain or tenderness  CV: No JVD. No peripheral edema.  Pulmonary: Symmetric Chest rise. Normal respiratory effort.  Abdomen: Soft to touch, non-tender.  Ext: No cyanosis, edema, or deformity  Skin: No rash. Normal palpation of skin.   Musculoskeletal: Normal digits and nails by  inspection. No clubbing.   Neurologic Examination  Mental status/Cognition: Alert, oriented to self, place, month and year, good attention.  Speech/language: Fluent, comprehension intact, object naming intact, repetition intact.  Cranial nerves:   CN II Pupils equal and reactive to light, no VF deficits    CN III,IV,VI EOM intact, no gaze preference or deviation, no nystagmus    CN  V normal sensation in V1, V2, and V3 segments bilaterally    CN VII Mild L facial droop   CN VIII normal hearing to speech    CN IX & X normal palatal elevation, no uvular deviation    CN XI 5/5 head turn and 5/5 shoulder shrug bilaterally    CN XII midline tongue protrusion    Motor:  Muscle bulk: normal, tone normal, pronator drift none  tremor  Mvmt Root Nerve  Muscle Right Left Comments  SA C5/6 Ax Deltoid 5 5   EF C5/6 Mc Biceps 5 5   EE C6/7/8 Rad Triceps 5 5   WF C6/7 Med FCR     WE C7/8 PIN ECU     F Ab C8/T1 U ADM/FDI 5 5   HF L1/2/3 Fem Illopsoas 5 5   KE L2/3/4 Fem Quad 5 5   DF L4/5 D Peron Tib Ant 5 5   PF S1/2 Tibial Grc/Sol 5 5    Reflexes:  Right Left Comments  Pectoralis      Biceps (C5/6) 2 2   Brachioradialis (C5/6) 2 2    Triceps (C6/7) 2 2    Patellar (L3/4) 2 2    Achilles (S1)      Hoffman      Plantar     Jaw jerk    Sensation:  Light touch intact   Pin prick    Temperature    Vibration   Proprioception    Coordination/Complex Motor:  - Finger to Nose intact BL - Heel to shin intact BL - Rapid alternating movement are intact. - Gait: unsafe to assess given R MCA thrombus and SAH.  Labs   CBC:  Recent Labs  Lab 08/12/20 1148  WBC 11.9*  NEUTROABS 8.8*  HGB 13.9  HCT 41.6  MCV 87.0  PLT 336    Basic Metabolic Panel:  Lab Results  Component Value Date   NA 136 08/12/2020   K 3.8 08/12/2020   CO2 27 08/12/2020   GLUCOSE 107 (H) 08/12/2020   BUN 16 08/12/2020   CREATININE 0.62 08/12/2020   CALCIUM 9.4 08/12/2020   GFRNONAA >60 08/12/2020    Lipid Panel: No results found for: LDLCALC HgbA1c: No results found for: HGBA1C Urine Drug Screen:     Component Value Date/Time   LABOPIA NONE DETECTED 08/12/2020 1300   COCAINSCRNUR NONE DETECTED 08/12/2020 1300   LABBENZ NONE DETECTED 08/12/2020 1300   AMPHETMU NONE DETECTED 08/12/2020 1300   THCU NONE DETECTED 08/12/2020 1300   LABBARB NONE DETECTED 08/12/2020 1300    Alcohol Level     Component Value Date/Time   ETH <10 08/12/2020 1148    CT Head Stroke code: Personally reviewed and ASPECTS of 8 with hyperdense R MCA.  CT Head without contrast: Personally reviewed and New small superficial densities in the right frontal lobe, indeterminate for trace subarachnoid hemorrhage versus cortical contrast staining or cortical petechial hemorrhage.  CT Head without contrast(personally reviewed): Unchanged trace subarachnoid hemorrhage.  CT angio Head and Neck with contrast and CT Perfusion(personally reviewed): 1. 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 2. Finding suspicious for a subocclusive embolus versus severe stenosis at the right MCA bifurcation. 3. Occlusion of the left vertebral artery at its origin with distal reconstitution. 4. CTP results as detailed above including a 25 mL penumbra.  Limited MRI Brain SWAN sequence(personally reviewed): Positive for small volume subarachnoid hemorrhage.  Impression   Vicki Gonzales is a 55 y.o. female with  PMH significant for HTN, hypothyroidism, migraines, spondylosis, who [facility] ED with dysarthric speech and a left facial droop. Found to have a developing R MCA stroke with an ASPECTS of 8 and a R MCA bifurcation subocclusive thrombus vs stenosis. She was given tPA and [facility] and MRI SWI sequences confirmed a small volume R frontal SAH. Her BP was elevated enroute and on arrival per EMS was 208/110. BP was  brought under 140/100 immediately. Thrombectomy was not pusued due to concern for worsening ICH once the vessel is opened up. tPA was not reversed immediately 2/2 concern for progression of R MCA stroke causing worsening of her symptoms and instead we opted for strict blood pressure control and repeat CTH every hours x 2 and having a baseline fibrinogen level in case tPA reversal is needed with an order to prepare cryoprecipitate but hold off on transfusing.  If the bleed worsens on imaging or she has clinical findings concerning for worsening of the bleed, we will immediately reverse the tPA.  Primary Diagnosis:  Cerebral infarction due to embolism of  right middle cerebral artery.   Secondary Diagnosis: Essential (primary) hypertension  Recommendations  Plan: - Frequent NeuroChecks for post tPA care per stroke unit protocol: - Initial CTH demonstrated no acute hemorrhage or mass - Repeat CTH: small volume R SAH vs petechial hemorrhage or contrast staining. - 2nd repeat CTH: stable small R SAH. - repeat CTH at 6 hours. - repeat CTH at 24 hours. - Limited MRI Brain SWI sequences - with small volume SAH - CTA - R MCA bifurcation subocclusive embolus vs severe stenosis. - CT Perfusion - 25ml penumbra. - TTE - pending. - Lipid Panel: LDL - pending.  - Statin: if LDL > 70. - HbA1c: pending. - No antiplatelets for now. - DVT prophylaxis: SCDs. Pharmacologic prophylaxis if 24 h CTH does not demonstrate acute hemorrhage - Systolic Blood Pressure goal: < 140 mm Hg Diastolic < 100mg  Hg. - Telemetry monitoring for arrhythmia: 72 hours - Swallow screen - ordered - PT/OT/SLP consults - Will benefit from a angiogram in a couple days.  HTN: - SBP goal < 140/100   Hypothyroidism: - continue home Synthroid.   ______________________________________________________________________  This patient is critically ill and at significant risk of neurological worsening, death and care requires  constant monitoring of vital signs, hemodynamics,respiratory and cardiac monitoring, neurological assessment, discussion with family, other specialists and medical decision making of high complexity. I spent 150 minutes of neurocritical care time  in the care of  this patient. This was time spent independent of any time provided by nurse practitioner or PA.  Erick BlinksSalman Ferrin Liebig [facility] Pager Number [phone] 08/12/2020  5:36 PM   Thank you for the opportunity to take part in the care of this patient. If you have any further questions, please contact the neurology consultation attending.  Signed,  Erick BlinksSalman Lexa Coronado [facility] Pager Number [phone] _ _ _   _ __   _ __ _ _  __ __   _ __   __ _

## 2020-08-13 ENCOUNTER — Inpatient Hospital Stay (HOSPITAL_COMMUNITY): Payer: BC Managed Care – PPO

## 2020-08-13 DIAGNOSIS — I63511 Cerebral infarction due to unspecified occlusion or stenosis of right middle cerebral artery: Secondary | ICD-10-CM

## 2020-08-13 DIAGNOSIS — I951 Orthostatic hypotension: Secondary | ICD-10-CM

## 2020-08-13 DIAGNOSIS — E782 Mixed hyperlipidemia: Secondary | ICD-10-CM

## 2020-08-13 LAB — BASIC METABOLIC PANEL
Anion gap: 8 (ref 5–15)
BUN: 14 mg/dL (ref 6–20)
CO2: 28 mmol/L (ref 22–32)
Calcium: 9.2 mg/dL (ref 8.9–10.3)
Chloride: 103 mmol/L (ref 98–111)
Creatinine, Ser: 0.84 mg/dL (ref 0.44–1.00)
GFR, Estimated: >60 mL/min (ref >60)
Glucose, Bld: 115 mg/dL — ABNORMAL HIGH (ref 70–99)
Potassium: 3.6 mmol/L (ref 3.5–5.1)
Sodium: 139 mmol/L (ref 135–145)

## 2020-08-13 LAB — CBC
HCT: 40.4 % (ref 36.0–46.0)
Hemoglobin: 13.2 g/dL (ref 12.0–15.0)
MCH: 28.3 pg (ref 26.0–34.0)
MCHC: 32.7 g/dL (ref 30.0–36.0)
MCV: 86.5 fL (ref 80.0–100.0)
Platelets: 350 10*3/uL (ref 150–400)
RBC: 4.67 MIL/uL (ref 3.87–5.11)
RDW: 14.5 % (ref 11.5–15.5)
WBC: 8.6 10*3/uL (ref 4.0–10.5)
nRBC: 0 % (ref 0.0–0.2)

## 2020-08-13 LAB — TYPE AND SCREEN
ABO/RH(D): A POS
Antibody Screen: NEGATIVE

## 2020-08-13 LAB — ECHOCARDIOGRAM COMPLETE
Area-P 1/2: 3.24 cm2
S' Lateral: 2.8 cm
Weight: 2980.62 oz

## 2020-08-13 LAB — ABO/RH: ABO/RH(D): A POS

## 2020-08-13 LAB — MRSA NEXT GEN BY PCR, NASAL: MRSA by PCR Next Gen: NOT DETECTED

## 2020-08-13 MED ORDER — CHLORHEXIDINE GLUCONATE CLOTH 2 % EX PADS
6.0000 | MEDICATED_PAD | Freq: Every day | CUTANEOUS | Status: DC
  Administered 2020-08-13 – 2020-08-17 (×5): 6 via TOPICAL

## 2020-08-13 MED ORDER — NIMODIPINE 30 MG PO CAPS
60.0000 mg | ORAL_CAPSULE | Freq: Once | ORAL | Status: DC
  Filled 2020-08-13: qty 2

## 2020-08-13 MED ORDER — CLOPIDOGREL BISULFATE 75 MG PO TABS: 75.0000 mg | ORAL_TABLET | Freq: Once | ORAL | Status: DC

## 2020-08-13 MED ORDER — TICAGRELOR 90 MG PO TABS: 180.0000 mg | ORAL_TABLET | Freq: Once | ORAL | Status: DC

## 2020-08-13 MED ORDER — HEPARIN SODIUM (PORCINE) 5000 UNIT/ML IJ SOLN
5000.0000 [IU] | Freq: Three times a day (TID) | INTRAMUSCULAR | Status: DC
  Administered 2020-08-13 – 2020-08-14 (×3): 5000 [IU] via SUBCUTANEOUS
  Filled 2020-08-13 (×3): qty 1

## 2020-08-13 MED ORDER — ASPIRIN 325 MG PO TABS: 325.0000 mg | ORAL_TABLET | Freq: Once | ORAL | Status: DC

## 2020-08-13 MED ORDER — ALBUMIN HUMAN 5 % IV SOLN
12.5000 g | Freq: Four times a day (QID) | INTRAVENOUS | Status: AC
  Administered 2020-08-13 – 2020-08-14 (×4): 12.5 g via INTRAVENOUS
  Filled 2020-08-13 (×4): qty 250

## 2020-08-13 MED ORDER — SODIUM CHLORIDE 0.9 % IV SOLN: INTRAVENOUS | Status: DC

## 2020-08-13 MED ORDER — ASPIRIN EC 81 MG PO TBEC: 81.0000 mg | DELAYED_RELEASE_TABLET | Freq: Every day | ORAL | Status: DC

## 2020-08-13 MED ORDER — TICAGRELOR 90 MG PO TABS: 90.0000 mg | ORAL_TABLET | Freq: Once | ORAL | Status: DC

## 2020-08-13 MED ORDER — SODIUM CHLORIDE 0.9 % IV BOLUS
1000.0000 mL | Freq: Once | INTRAVENOUS | Status: AC
  Administered 2020-08-13: 1000 mL via INTRAVENOUS

## 2020-08-13 MED ORDER — VANCOMYCIN HCL IN DEXTROSE 1-5 GM/200ML-% IV SOLN
1000.0000 mg | Freq: Once | INTRAVENOUS | Status: AC
  Administered 2020-08-14: 1000 mg via INTRAVENOUS
  Filled 2020-08-13: qty 200

## 2020-08-13 MED ORDER — PANTOPRAZOLE SODIUM 40 MG PO TBEC
40.0000 mg | DELAYED_RELEASE_TABLET | Freq: Every day | ORAL | Status: DC
  Administered 2020-08-13 – 2020-08-22 (×8): 40 mg via ORAL
  Filled 2020-08-13 (×8): qty 1

## 2020-08-13 MED ORDER — TICAGRELOR 90 MG PO TABS
180.0000 mg | ORAL_TABLET | Freq: Once | ORAL | Status: AC
  Administered 2020-08-13: 180 mg via ORAL
  Filled 2020-08-13: qty 2

## 2020-08-13 MED ORDER — SODIUM CHLORIDE 0.9 % IV BOLUS: 500.0000 mL | Freq: Once | INTRAVENOUS | Status: DC

## 2020-08-13 MED ORDER — ASPIRIN 81 MG PO CHEW
81.0000 mg | CHEWABLE_TABLET | Freq: Once | ORAL | Status: AC
  Administered 2020-08-14: 81 mg via ORAL
  Filled 2020-08-13: qty 1

## 2020-08-13 MED ORDER — ASPIRIN EC 81 MG PO TBEC
81.0000 mg | DELAYED_RELEASE_TABLET | Freq: Every day | ORAL | Status: DC
  Administered 2020-08-13: 81 mg via ORAL
  Filled 2020-08-13: qty 1

## 2020-08-13 MED ORDER — TICAGRELOR 90 MG PO TABS
90.0000 mg | ORAL_TABLET | Freq: Two times a day (BID) | ORAL | Status: AC
  Administered 2020-08-13 – 2020-08-14 (×2): 90 mg via ORAL
  Filled 2020-08-13 (×2): qty 1

## 2020-08-13 MED ORDER — ONDANSETRON HCL 4 MG/2ML IJ SOLN
4.0000 mg | Freq: Four times a day (QID) | INTRAMUSCULAR | Status: DC | PRN
  Administered 2020-08-13 – 2020-08-15 (×2): 4 mg via INTRAVENOUS
  Filled 2020-08-13 (×2): qty 2

## 2020-08-13 MED ORDER — ATORVASTATIN CALCIUM 80 MG PO TABS
80.0000 mg | ORAL_TABLET | Freq: Every day | ORAL | Status: DC
  Administered 2020-08-13 – 2020-08-22 (×8): 80 mg via ORAL
  Filled 2020-08-13 (×8): qty 1

## 2020-08-13 MED ORDER — ONDANSETRON HCL 4 MG/2ML IJ SOLN
INTRAMUSCULAR | Status: AC
  Administered 2020-08-13: 4 mg via INTRAVENOUS
  Filled 2020-08-13: qty 2

## 2020-08-13 MED ORDER — ONDANSETRON HCL 4 MG/2ML IJ SOLN: 4.0000 mg | Freq: Four times a day (QID) | INTRAMUSCULAR | Status: DC

## 2020-08-13 NOTE — Evaluation (Addendum)
Physical Therapy Evaluation Patient Details Name: Vicki Gonzales MRN: [id number] DOB: [birth year] Today's Date: 08/13/2020   History of Present Illness  Pt is a 55 y.o. F who presents with dysarthric speech and left facial droop. Found to have a developing R MCA stroke with an ASPECTS of 8 and a R MCA bifurcation subocclusive thrombus vs stenosis. Received tPA. She reported headache on arrival to [facility] and MRI SWI sequences confirmed a small volume R frontal SAH. Thrombectomy was not pursued and tPA was not reversed immediately due to concern for progression of R MCA stroke. Significant PMH: HTN, migraines.  Clinical Impression  Prior to admission, pt lives with her spouse/son and is independent. Pt presents with decreased functional mobility secondary to left hemiplegia, inattention, poor sitting/standing balance, and visual deficits. Pt requiring two person mod-max assist for functional mobility. Performed stand pivot transfer towards right to recliner with left knee block due to buckle. Once up in chair, pt reporting urge to urinate. Assisted onto [facility], where she subsequently became orthostatic and diaphoretic. With +3, were able to safely assist back into bed. Expect excellent progress given age, motivation, family support. Highly recommend CIR to address deficits and maximize functional mobility.   Vitals: Sitting on EOB: 131/80 (93) Sitting on BSC: 80/57 (64) Supine post mobility: 126/70 (85)    Follow Up Recommendations CIR    Equipment Recommendations  Other (comment) (TBA)    Recommendations for Other Services Rehab consult     Precautions / Restrictions Precautions Precautions: Fall;Other (comment) Precaution Comments: L inattention, hemiplegia, SBP < 140 Restrictions Weight Bearing Restrictions: No      Mobility  Bed Mobility Overal bed mobility: Needs Assistance Bed Mobility: Sit to Supine       Sit to supine: Mod assist;+2 for physical assistance    General bed mobility comments: Sitting up on edge fo bed with RN upon arrival. modA + 2 to return to bed with BLE and trunk management    Transfers Overall transfer level: Needs assistance Equipment used: None Transfers: Sit to/from [employer] Transfers Sit to Stand: Mod assist;+2 physical assistance Stand pivot transfers: Max assist;+2 physical assistance       General transfer comment: Pt performed stand pivot transfer towards right to recliner with up to maxA + 2, left knee block with noted buckle, difficulty weight shifting to take steps. Max multimodal cues for upward gaze, hip extension, midline positioning. Pt then performed subsequent SPT to [facility]; once on BSC, pt became orthostatic and performed another stand with 3rd person removing BSC and placing bed behind her in order to return to supine  Ambulation/Gait                Stairs            Wheelchair Mobility    Modified Rankin (Stroke Patients Only) Modified Rankin (Stroke Patients Only) Pre-Morbid Rankin Score: No symptoms Modified Rankin: Severe disability     Balance Overall balance assessment: Needs assistance Sitting-balance support: Feet supported Sitting balance-Leahy Scale: Poor Sitting balance - Comments: Left lateral lean, requiring up to modA, able to correct to min guard assist with visual/verbal cues for midline and intermittently holding onto R railing.   Standing balance support: No upper extremity supported;During functional activity Standing balance-Leahy Scale: Poor Standing balance comment: Requiring mod-maxA + 2 for static standing balance  Pertinent Vitals/Pain Pain Assessment: Faces Faces Pain Scale: Hurts little more Pain Location: headache Pain Descriptors / Indicators: Headache Pain Intervention(s): Monitored during session    Home Living Family/patient expects to be discharged to:: Private residence Living Arrangements:  Spouse/significant other;Children (spouse, son) Available Help at Discharge: Family;Available 24 hours/day Type of Home: House Home Access: Stairs to enter   [employer] of Steps:  (1/2 a step) Home Layout: Two level Home Equipment: Shower seat;Hand held Careers information officer - 2 wheels Additional Comments: 1/2 bath main level    Prior Function Level of Independence: Independent               Hand Dominance   Dominant Hand: Right    Extremity/Trunk Assessment   Upper Extremity Assessment Upper Extremity Assessment: Defer to OT evaluation    Lower Extremity Assessment Lower Extremity Assessment: LLE deficits/detail;RLE deficits/detail RLE Deficits / Details: Strength 5/5 LLE Deficits / Details: Grossly 5/5 except hip flexion 3+/5 LLE Sensation: decreased light touch    Cervical / Trunk Assessment Cervical / Trunk Assessment: Normal  Communication   Communication: No difficulties  Cognition Arousal/Alertness: Awake/alert Behavior During Therapy: Flat affect Overall Cognitive Status: Impaired/Different from baseline Area of Impairment: Safety/judgement;Awareness;Problem solving                         Safety/Judgement: Decreased awareness of safety;Decreased awareness of deficits Awareness: Emergent Problem Solving: Requires verbal cues General Comments: A&Ox4, decreased awareness and insight into deficits.      General Comments      Exercises     Assessment/Plan    PT Assessment Patient needs continued PT services  PT Problem List Decreased strength;Decreased activity tolerance;Decreased balance;Decreased mobility;Decreased cognition;Decreased safety awareness;Impaired sensation       PT Treatment Interventions DME instruction;Gait training;Functional mobility training;Stair training;Therapeutic activities;Therapeutic exercise;Balance training;Patient/family education    PT Goals (Current goals can be found in the Care Plan section)   Acute Rehab PT Goals Patient Stated Goal: to get some rest PT Goal Formulation: With patient Time For Goal Achievement: 08/27/20 Potential to Achieve Goals: Good    Frequency Min 4X/week   Barriers to discharge        Co-evaluation               AM-PAC PT "6 Clicks" Mobility  Outcome Measure Help needed turning from your back to your side while in a flat bed without using bedrails?: A Little Help needed moving from lying on your back to sitting on the side of a flat bed without using bedrails?: A Lot Help needed moving to and from a bed to a chair (including a wheelchair)?: Total Help needed standing up from a chair using your arms (e.g., wheelchair or bedside chair)?: Total Help needed to walk in hospital room?: Total Help needed climbing 3-5 steps with a railing? : Total 6 Click Score: 9    End of Session Equipment Utilized During Treatment: Gait belt Activity Tolerance: Other (comment) (limited by orthostatic hypotension) Patient left: in bed;with call bell/phone within reach;with bed alarm set;with family/visitor present;with nursing/sitter in room Nurse Communication: Mobility status PT Visit Diagnosis: Unsteadiness on feet (R26.81);Difficulty in walking, not elsewhere classified (R26.2);Hemiplegia and hemiparesis Hemiplegia - Right/Left: Left Hemiplegia - dominant/non-dominant: Non-dominant Hemiplegia - caused by: Cerebral infarction;Nontraumatic SAH    Time: 6387-5643 PT Time Calculation (min) (ACUTE ONLY): 31 min   Charges:   PT Evaluation $PT Eval Moderate Complexity: 1 Mod PT Treatments $Therapeutic Activity: 8-22 mins  Lillia Pauls, PT, DPT Acute Rehabilitation Services Pager [phone] Office [phone]   Norval Morton 08/13/2020, 1:32 PM

## 2020-08-13 NOTE — Progress Notes (Signed)
Inpatient Rehab Admissions Coordinator Note:   Per therapy recommendations, pt was screened for CIR candidacy by Krissi Willaims, MS CCC-SLP. At this time, Pt. Appears to have functional decline and is a potential  candidate for CIR. Will place order for rehab consult per protocol.  Please contact me with questions.   Jaquaveon Bilal, MS, CCC-SLP Rehab Admissions Coordinator  [phone] (celll) [phone] (office)  

## 2020-08-13 NOTE — Plan of Care (Signed)
Discussed BP goal with nurse, Judie Bonus.  Due to patient's symptomatic hypotension earlier today with focal neurological deficits in that setting, severe symptomatic intracranial stenosis, as well as trace subarachnoid hemorrhage, agree with Dr. Warren Danes documented blood pressure goal of 130-160.  Patient does have a Cleviprex drip ordered and I would favor continuing to use this medication as needed for blood pressure control overnight until potential revascularization tomorrow given significant potential harm in dropping blood pressure too much as well as potential worsening hemorrhage if blood pressure is too high.  Titratable drip will offer optimal speed of blood pressure control.  Brooke Dare MD-PhD [facility] [phone]  Available 7 PM to 7 AM, outside of these hours please call Neurologist on call as listed on Amion.

## 2020-08-13 NOTE — Progress Notes (Signed)
OT Cancellation Note  Patient Details Name: Vicki Gonzales MRN: [id number] DOB: 07-Nov-1965   Cancelled Treatment:    Reason Eval/Treat Not Completed: Patient's level of consciousness (pt very sleepy and low BP) Pt with orthostatic event and now very fatigued. Ot to attempt at more appropriate time. OT and RN Junious Dresser discussed.   Wynona Neat, OTR/L  Acute Rehabilitation Services Pager: [phone] Office: [phone] .  08/13/2020, 2:55 PM

## 2020-08-13 NOTE — Progress Notes (Signed)
  Echocardiogram 2D Echocardiogram has been performed.  Vicki Gonzales 08/13/2020, 9:32 AM

## 2020-08-13 NOTE — Progress Notes (Signed)
SLP Cancellation Note  Patient Details Name: Vicki Gonzales MRN: [id number] DOB: April 13, 1965   Cancelled treatment:       Reason Eval/Treat Not Completed: Other (comment) (patient receiving nursing care. SLP will f/u next date.)   Angela Nevin, MA, CCC-SLP Speech Therapy

## 2020-08-13 NOTE — Consult Note (Signed)
Chief Complaint: CVA.   Referring Physician(s): Dr. Roda Shutters  Supervising Physician: Julieanne Cotton  Patient Status: [facility] - In-pt  History of Present Illness: Vicki Gonzales is a 55 y.o. female Inpatient. History of hypothyroidism, spondylosis, migraines. Presented to the ED at [facility] on 8.7.22 with left sided facial droop and dysarthria. CT Head Stroke reads Acute infarct in the right insula and frontal operculum, ASPECTS is 8. CT angio head neck reads Proximal right ICA occlusion with intracranial reconstitution. 2. Finding suspicious for a subocclusive embolus versus severe stenosis at the right MCA bifurcation. 3. Occlusion of the left vertebral artery at its origin with distal reconstitution. 4. CTP results as detailed above including a 25 mL penumbra. tPA was initiated and the patient was transferred to [facility] further evaluation and possible intervention. Upon arrival to [facility] Vicki Gonzales was reporting the worse head ache in her life. Second Head CT performed at [facility] reads New small superficial densities in the right frontal lobe, indeterminate for trace subarachnoid hemorrhage versus cortical contrast staining or cortical petechial hemorrhage. Due to this the thrombectomy was aborted. Patient seen by Dr. Julieanne Cotton at bedside this morning with husband presents. Left sided weakness noted  (upper extremity > lower extremity). P er husband at bedside weakness is improving. He states that they patient has no slurred speech and is back at baseline. MR/MRA from 8.8.22 reads . Multifocal areas of right MCA territory infarct including a new area in the right perirolandic region when compared to prior CT. 2. Few small foci of susceptibility artifact within the areas of restricted diffusion corresponding to petechial hemorrhage. 3. Occlusion of the intracranial right ICA with reconstitution at the ophthalmic segment. 4. Occlusion of a right M3/MCA anterior division  branch. 5. Stable trace right subarachnoid hemorrhage. 6. Moderate chronic white matter disease. Since study Patient has decompensated. She is now hypertensive  with greater left sided weakness and left sided neglect and decreased sensation.   Past Medical History:  Diagnosis Date   ADHD    Anxiety    Carpal tunnel syndrome on right    Depression    HTN (hypertension)    Hypothyroidism    Migraines    Spondylosis     History reviewed. No pertinent surgical history.  Allergies: Keflex [cephalexin]  Medications: Prior to Admission medications   Medication Sig Start Date End Date Taking? Authorizing Provider  acetaminophen (TYLENOL) 500 MG tablet Take 500 mg by mouth every 6 (six) hours as needed for mild pain, fever or headache.   Yes [provider]  levothyroxine (SYNTHROID) 50 MCG tablet Take 50 mcg by mouth every morning. 05/10/20  Yes [provider]  lisinopril-hydrochlorothiazide (ZESTORETIC) 20-12.5 MG tablet Take 1 tablet by mouth daily. 06/18/20  Yes [provider]  PARoxetine (PAXIL) 10 MG tablet Take 10 mg by mouth daily. 05/04/20  Yes [provider]     History reviewed. No pertinent family history.  Social History   Socioeconomic History   Marital status: Married    Spouse name: Not on file   Number of children: Not on file   Years of education: Not on file   Highest education level: Not on file  Occupational History   Not on file  Tobacco Use   Smoking status: Former   Smokeless tobacco: Never  Substance and Sexual Activity   Alcohol use: Not on file   Drug use: Not on file   Sexual activity: Not on file  Other Topics Concern   Not on file  Social History Narrative   Not on file   Social Determinants of Health   Financial Resource Strain: Not on file  Food Insecurity: Not on file  Transportation Needs: Not on file  Physical Activity: Not on file  Stress: Not on file  Social Connections: Not on file    Review  of Systems: A 12 point ROS discussed and pertinent positives are indicated in the HPI above.  All other systems are negative.  Review of Systems  Constitutional:  Positive for fatigue. Negative for fever.  HENT:  Negative for congestion.   Respiratory:  Negative for cough and shortness of breath.   Gastrointestinal:  Negative for abdominal pain, diarrhea, nausea and vomiting.   Vital Signs: BP (!) 160/136   Pulse 75   Temp 98.7 F (37.1 C) (Oral)   Resp 19   SpO2 90%   Physical Exam Vitals and nursing note reviewed.  Constitutional:      Appearance: She is well-developed.  HENT:     Head: Normocephalic and atraumatic.  Eyes:     Conjunctiva/sclera: Conjunctivae normal.  Cardiovascular:     Rate and Rhythm: Normal rate and regular rhythm.  Pulmonary:     Effort: Pulmonary effort is normal.  Musculoskeletal:        General: Normal range of motion.     Cervical back: Normal range of motion.  Skin:    General: Skin is warm.  Neurological:     Mental Status: She is alert and oriented to person, place, and time.     Comments: Alert, aware and oriented X 3 Speech slightly slurred Comprehension is intact.  facial droop noted Tongue midline Can spontaneously move all 4 extremities.  Right > left  Hand grip strength. Right > left Left upper and lower extremity drift with neglect.  Decrease in sensation to left upper and lower extremity.   Speech, cognition and language  are generally intact.  Comprehension and fluency are normal.  Judgment and insight normal   Gait not assessed Romberg not assessed Heel to toe not assessed Distal pulses not assessed     Imaging: CT HEAD WO CONTRAST (5MM)  Result Date: 08/12/2020 CLINICAL DATA:  Cerebral hemorrhage suspected EXAM: CT HEAD WITHOUT CONTRAST TECHNIQUE: Contiguous axial images were obtained from the base of the skull through the vertex without intravenous contrast. COMPARISON:  None. FINDINGS: Brain: There is an early  subacute infarct of the right frontal operculum and insula. Faint subarachnoid hemorrhage over the right frontal lobe is unchanged. There is no new site of hemorrhage. No midline shift or other mass effect. Vascular: No abnormal hyperdensity of the major intracranial arteries or dural venous sinuses. No intracranial atherosclerosis. Skull: The visualized skull base, calvarium and extracranial soft tissues are normal. Sinuses/Orbits: No fluid levels or advanced mucosal thickening of the visualized paranasal sinuses. No mastoid or middle ear effusion. The orbits are normal. IMPRESSION: 1. Unchanged appearance of early subacute infarct of the right frontal operculum and insula. 2. Unchanged faint subarachnoid hemorrhage over the right frontal lobe. 3. No new site of hemorrhage. Electronically Signed   By: Deatra RobinsonKevin  Herman M.D.   On: 08/12/2020 23:38   CT HEAD WO CONTRAST (5MM)  Result Date: 08/12/2020 CLINICAL DATA:  Follow-up subarachnoid hemorrhage EXAM: CT HEAD WITHOUT CONTRAST TECHNIQUE: Contiguous axial images were obtained from the base of the skull through the vertex without intravenous contrast. COMPARISON:  08/12/2020 FINDINGS: Brain: There appears to continue to be  a small amount of subarachnoid hemorrhage in the right frontal region, unchanged since prior study. No new areas of hemorrhage. No hydrocephalus or acute infarction. Vascular: No hyperdense vessel or unexpected calcification. Skull: No acute calvarial abnormality. Sinuses/Orbits: No acute findings Other: None IMPRESSION: Stable minimal subarachnoid hemorrhage in the right frontal region, unchanged. Electronically Signed   By: Charlett Nose M.D.   On: 08/12/2020 18:49   CT HEAD WO CONTRAST ( )  Result Date: 08/12/2020 CLINICAL DATA:  Cerebral hemorrhage suspected. Follow-up hemorrhage status post tPA. EXAM: CT HEAD WITHOUT CONTRAST TECHNIQUE: Contiguous axial images were obtained from the base of the skull through the vertex without intravenous  contrast. COMPARISON:  Head CT and MRI earlier today. FINDINGS: Brain: Minimal right frontal subarachnoid hemorrhage is unchanged in extent from the earlier CT though is slightly less conspicuous (potentially related to different scanners). No new intracranial hemorrhage, extra-axial fluid collection, mass, or midline shift is identified. Cytotoxic edema related to an acute right MCA infarct in the insula and frontal operculum is unchanged. The ventricles are normal in size. Vascular: Residual intravascular contrast material. Skull: No fracture or suspicious osseous lesion. Sinuses/Orbits: Minimal mucosal thickening in the right maxillary sinus. Clear mastoid air cells. Unremarkable orbits. Other: None. IMPRESSION: 1. Unchanged trace subarachnoid hemorrhage. 2. Unchanged acute right MCA infarct. Electronically Signed   By: Sebastian Ache M.D.   On: 08/12/2020 15:56   CT HEAD WO CONTRAST ( )  Result Date: 08/12/2020 CLINICAL DATA:  Cerebral hemorrhage suspected. Headache following tPA. EXAM: CT HEAD WITHOUT CONTRAST TECHNIQUE: Contiguous axial images were obtained from the base of the skull through the vertex without intravenous contrast. COMPARISON:  Head CT earlier today FINDINGS: Brain: Hypodensity in the right insula and right frontal operculum is similar in extent to today's earlier CT and consistent with an acute infarct. There is a small amount of scattered curvilinear hyperdensity in the right frontal lobe which is indeterminate for trace subarachnoid hemorrhage versus cortical contrast staining or cortical petechial hemorrhage. The ventricles are normal in size. No mass, midline shift, or extra-axial fluid collection is identified. Vascular: Residual intravascular contrast from earlier CTA. Skull: No fracture or suspicious osseous lesion. Sinuses/Orbits: Visualized paranasal sinuses and mastoid air cells are clear. Unremarkable orbits. Other: None. IMPRESSION: 1. New small superficial densities in the  right frontal lobe, indeterminate for trace subarachnoid hemorrhage versus cortical contrast staining or cortical petechial hemorrhage. 2. Unchanged extent of acute right MCA infarct involving the insula and frontal operculum. The study was discussed by telephone with Dr. Erick Blinks on 08/12/2020 at 2:15 p.m. Electronically Signed   By: Sebastian Ache M.D.   On: 08/12/2020 14:42   MR ANGIO HEAD WO CONTRAST  Result Date: 08/13/2020 CLINICAL DATA:  Neuro deficit, acute, stroke suspected. EXAM: MRI HEAD WITHOUT CONTRAST MRA HEAD WITHOUT CONTRAST TECHNIQUE: Multiplanar, multi-echo pulse sequences of the brain and surrounding structures were acquired without intravenous contrast. Angiographic images of the Circle of Willis were acquired using MRA technique without intravenous contrast. COMPARISON:  Head CT and head and neck CTA August 12, 2020. FINDINGS: MRI HEAD FINDINGS Brain: Area of restricted diffusion involving the right perirolandic region, corresponding to acute infarct, new from prior CT. Area of restricted diffusion involving the right frontal operculum and insula and focus of restricted diffusion in the right posterior temporal lobe, corresponding to acute/subacute infarcts, seen on prior CT. Foci of susceptibility artifact are seen in the right frontal lobe, parietal lobe, insula and temporal lobe, within the areas of restricted diffusion, consistent  with petechial hemorrhage. There is also susceptibility artifact in right frontal high convexity sulci, corresponding to subarachnoid hemorrhage seen on prior studies. Scattered foci of T2 hyperintensity are seen within the white matter of the cerebral hemispheres and within the pons, nonspecific, most likely related to chronic small vessel ischemia. Vascular: Normal flow voids. Skull and upper cervical spine: Normal marrow signal. Sinuses/Orbits: Trace mucosal thickening throughout the paranasal sinuses and small mucous retention cyst in the right  maxillary sinus. The orbits are maintained. Other: None. MRA HEAD FINDINGS Anterior circulation: Absent flow related enhancement in the upper cervical and intracranial right internal carotid artery with reconstitution of flow at the ophthalmic segment with diminished caliber. Normal caliber and flow related enhancement of the upper cervical and intracranial left ICA. Normal flow related enhancement of the right M1/MCA segment with occlusion of a right M3/MCA anterior division branch. The bilateral ACA and left MCA vascular trees are preserved. Posterior circulation: Absent flow related enhancement in the proximal left vertebral artery with normal flow distal to the PICA origin. The dominant right vertebral artery has normal course and caliber. The basilar artery and bilateral posterior cerebral arteries are unremarkable. Anatomic variants: None significant. IMPRESSION: 1. Multifocal areas of right MCA territory infarct including a new area in the right perirolandic region when compared to prior CT. 2. Few small foci of susceptibility artifact within the areas of restricted diffusion corresponding to petechial hemorrhage. 3. Occlusion of the intracranial right ICA with reconstitution at the ophthalmic segment. 4. Occlusion of a right M3/MCA anterior division branch. 5. Stable trace right subarachnoid hemorrhage. 6. Moderate chronic white matter disease. These results were called by telephone at the time of interpretation on 08/13/2020 at 1:17 pm to Dr. Marvel Plan , who verbally acknowledged these results. Electronically Signed   By: Baldemar Lenis M.D.   On: 08/13/2020 13:18   MR BRAIN WO CONTRAST  Result Date: 08/13/2020 CLINICAL DATA:  Neuro deficit, acute, stroke suspected. EXAM: MRI HEAD WITHOUT CONTRAST MRA HEAD WITHOUT CONTRAST TECHNIQUE: Multiplanar, multi-echo pulse sequences of the brain and surrounding structures were acquired without intravenous contrast. Angiographic images of the Circle  of Willis were acquired using MRA technique without intravenous contrast. COMPARISON:  Head CT and head and neck CTA August 12, 2020. FINDINGS: MRI HEAD FINDINGS Brain: Area of restricted diffusion involving the right perirolandic region, corresponding to acute infarct, new from prior CT. Area of restricted diffusion involving the right frontal operculum and insula and focus of restricted diffusion in the right posterior temporal lobe, corresponding to acute/subacute infarcts, seen on prior CT. Foci of susceptibility artifact are seen in the right frontal lobe, parietal lobe, insula and temporal lobe, within the areas of restricted diffusion, consistent with petechial hemorrhage. There is also susceptibility artifact in right frontal high convexity sulci, corresponding to subarachnoid hemorrhage seen on prior studies. Scattered foci of T2 hyperintensity are seen within the white matter of the cerebral hemispheres and within the pons, nonspecific, most likely related to chronic small vessel ischemia. Vascular: Normal flow voids. Skull and upper cervical spine: Normal marrow signal. Sinuses/Orbits: Trace mucosal thickening throughout the paranasal sinuses and small mucous retention cyst in the right maxillary sinus. The orbits are maintained. Other: None. MRA HEAD FINDINGS Anterior circulation: Absent flow related enhancement in the upper cervical and intracranial right internal carotid artery with reconstitution of flow at the ophthalmic segment with diminished caliber. Normal caliber and flow related enhancement of the upper cervical and intracranial left ICA. Normal flow related enhancement  of the right M1/MCA segment with occlusion of a right M3/MCA anterior division branch. The bilateral ACA and left MCA vascular trees are preserved. Posterior circulation: Absent flow related enhancement in the proximal left vertebral artery with normal flow distal to the PICA origin. The dominant right vertebral artery has normal  course and caliber. The basilar artery and bilateral posterior cerebral arteries are unremarkable. Anatomic variants: None significant. IMPRESSION: 1. Multifocal areas of right MCA territory infarct including a new area in the right perirolandic region when compared to prior CT. 2. Few small foci of susceptibility artifact within the areas of restricted diffusion corresponding to petechial hemorrhage. 3. Occlusion of the intracranial right ICA with reconstitution at the ophthalmic segment. 4. Occlusion of a right M3/MCA anterior division branch. 5. Stable trace right subarachnoid hemorrhage. 6. Moderate chronic white matter disease. These results were called by telephone at the time of interpretation on 08/13/2020 at 1:17 pm to Dr. Marvel Plan , who verbally acknowledged these results. Electronically Signed   By: Baldemar Lenis M.D.   On: 08/13/2020 13:18   MR BRAIN WO CONTRAST  Result Date: 08/12/2020 CLINICAL DATA:  Brain mass or lesion. Code stroke. Trace subarachnoid hemorrhage versus cortical contrast staining or cortical petechial hemorrhage on CT following tPA. EXAM: MRI HEAD WITHOUT CONTRAST TECHNIQUE: Multiplanar, multiecho pulse sequences of the brain and surrounding structures were obtained without intravenous contrast. COMPARISON:  Head CT 08/12/2020 FINDINGS: A limited study consisting of only axial susceptibility weighted imaging was performed at the direction of the stroke neurologist. There is mild-to-moderate motion artifact, however curvilinear foci of susceptibility artifact are identified in several right frontal sulci most consistent with small volume acute subarachnoid hemorrhage when correlating with the preceding CT. There may also be minimal subarachnoid hemorrhage in the right parietal region. IMPRESSION: Positive for small volume subarachnoid hemorrhage. These results were called by telephone at the time of interpretation on 08/12/2020 at 2:53 p.m. to Dr. Erick Blinks,  who verbally acknowledged these results. Electronically Signed   By: Sebastian Ache M.D.   On: 08/12/2020 15:03   ECHOCARDIOGRAM COMPLETE  Result Date: 08/13/2020    ECHOCARDIOGRAM REPORT   Patient Name:   Vicki Gonzales Date of Exam: 08/13/2020 Medical Rec #:  [id number]               Height:       66.0 in Accession #:    [id number]              Weight:       186.3 lb Date of Birth:  1965/05/31               BSA:          1.940 m Patient Age:    54 years                BP:           130/80 mmHg Patient Gender: F                       HR:           69 bpm. Exam Location:  Inpatient Procedure: 2D Echo, Cardiac Doppler and Color Doppler Indications:    CVA  History:        Patient has no prior history of Echocardiogram examinations.                 Risk Factors:Hypertension.  Sonographer:  Lavenia Atlas Referring Phys: [id number] [facility] IMPRESSIONS  1. Left ventricular ejection fraction, by estimation, is 60 to 65%. The left ventricle has normal function. The left ventricle has no regional wall motion abnormalities. Left ventricular diastolic parameters are consistent with Grade I diastolic dysfunction (impaired relaxation).  2. Right ventricular systolic function is normal. The right ventricular size is normal. There is mildly elevated pulmonary artery systolic pressure.  3. The mitral valve is normal in structure. No evidence of mitral valve regurgitation. No evidence of mitral stenosis.  4. The aortic valve is tricuspid. Aortic valve regurgitation is not visualized. Mild aortic valve sclerosis is present, with no evidence of aortic valve stenosis.  5. The inferior vena cava is normal in size with greater than 50% respiratory variability, suggesting right atrial pressure of 3 mmHg. Comparison(s): No prior Echocardiogram. FINDINGS  Left Ventricle: Left ventricular ejection fraction, by estimation, is 60 to 65%. The left ventricle has normal function. The left ventricle has no regional wall motion  abnormalities. The left ventricular internal cavity size was normal in size. There is  no left ventricular hypertrophy. Left ventricular diastolic parameters are consistent with Grade I diastolic dysfunction (impaired relaxation). Right Ventricle: The right ventricular size is normal. Right ventricular systolic function is normal. There is mildly elevated pulmonary artery systolic pressure. The tricuspid regurgitant velocity is 2.95 m/s, and with an assumed right atrial pressure of 3 mmHg, the estimated right ventricular systolic pressure is 37.8 mmHg. Left Atrium: Left atrial size was normal in size. Right Atrium: Right atrial size was normal in size. Pericardium: There is no evidence of pericardial effusion. Mitral Valve: The mitral valve is normal in structure. Mild mitral annular calcification. No evidence of mitral valve regurgitation. No evidence of mitral valve stenosis. Tricuspid Valve: The tricuspid valve is normal in structure. Tricuspid valve regurgitation is mild . No evidence of tricuspid stenosis. Aortic Valve: The aortic valve is tricuspid. Aortic valve regurgitation is not visualized. Mild aortic valve sclerosis is present, with no evidence of aortic valve stenosis. Pulmonic Valve: The pulmonic valve was not well visualized. Pulmonic valve regurgitation is not visualized. No evidence of pulmonic stenosis. Aorta: The aortic root is normal in size and structure. Venous: The inferior vena cava is normal in size with greater than 50% respiratory variability, suggesting right atrial pressure of 3 mmHg. IAS/Shunts: No atrial level shunt detected by color flow Doppler.  LEFT VENTRICLE PLAX 2D LVIDd:         4.40 cm  Diastology LVIDs:         2.80 cm  LV e' medial:    6.20 cm/s LV PW:         1.10 cm  LV E/e' medial:  11.7 LV IVS:        1.10 cm  LV e' lateral:   6.53 cm/s LVOT diam:     2.00 cm  LV E/e' lateral: 11.1 LV SV:         60 LV SV Index:   31 LVOT Area:     3.14 cm  RIGHT VENTRICLE RV Basal diam:   3.30 cm RV S prime:     8.92 cm/s TAPSE (M-mode): 2.9 cm LEFT ATRIUM             Index       RIGHT ATRIUM           Index LA diam:        4.00 cm 2.06 cm/m  RA Area:  13.40 cm LA Vol (A2C):   31.1 ml 16.03 ml/m RA Volume:   34.60 ml  17.83 ml/m LA Vol (A4C):   42.7 ml 22.01 ml/m LA Biplane Vol: 38.1 ml 19.64 ml/m  AORTIC VALVE LVOT Vmax:   108.00 cm/s LVOT Vmean:  72.400 cm/s LVOT VTI:    0.191 m  AORTA Ao Root diam: 2.70 cm MITRAL VALVE               TRICUSPID VALVE MV Area (PHT): 3.24 cm    TR Peak grad:   34.8 mmHg MV Decel Time: 234 msec    TR Vmax:        295.00 cm/s MV E velocity: 72.40 cm/s MV A velocity: 87.80 cm/s  SHUNTS MV E/A ratio:  0.82        Systemic VTI:  0.19 m                            Systemic Diam: 2.00 cm Olga Millers MD Electronically signed by Olga Millers MD Signature Date/Time: 08/13/2020/12:12:42 PM    Final    CT HEAD CODE STROKE WO CONTRAST  Result Date: 08/12/2020 CLINICAL DATA:  Code stroke.  Slurred speech EXAM: CT HEAD WITHOUT CONTRAST TECHNIQUE: Contiguous axial images were obtained from the base of the skull through the vertex without intravenous contrast. COMPARISON:  None. FINDINGS: Brain: Cytotoxic edema appearance in the right insula and frontal operculum no ganglionic or supraganglionic infarct is seen. No acute hemorrhage, hydrocephalus, or collection. Vascular: Hyperdense right M1 suggested on sagittal images, although potentially accentuated by acute infarct in not persistent on the other planes. Skull: No acute finding Sinuses/Orbits: Negative Other: Critical Value/emergent results were called by telephone at the time of interpretation on 08/12/2020 at 11:43 am to provider Dorothea Glassman , who verbally acknowledged these results. ASPECTS [facility] Stroke Program Early CT Score) - Ganglionic level infarction (caudate, lentiform nuclei, internal capsule, insula, M1-M3 cortex): 5 - Supraganglionic infarction (M4-M6 cortex): 3 Total score (0-10 with 10 being  normal): 8 IMPRESSION: 1. Acute infarct in the right insula and frontal operculum, ASPECTS is 8. 2.  Equivocal hyperdensity of the right M1. 3. No acute hemorrhage. Electronically Signed   By: Marnee Spring M.D.   On: 08/12/2020 11:45   CT ANGIO HEAD NECK W WO CM W PERF (CODE STROKE)  Result Date: 08/12/2020 CLINICAL DATA:  Slurred speech. EXAM: CT ANGIOGRAPHY HEAD AND NECK CT PERFUSION BRAIN TECHNIQUE: Multidetector CT imaging of the head and neck was performed using the standard protocol during bolus administration of intravenous contrast. Multiplanar CT image reconstructions and MIPs were obtained to evaluate the vascular anatomy. Carotid stenosis measurements (when applicable) are obtained utilizing NASCET criteria, using the distal internal carotid diameter as the denominator. Multiphase CT imaging of the brain was performed following IV bolus contrast injection. Subsequent parametric perfusion maps were calculated using RAPID software. CONTRAST:  OMNIPAQUE IOHEXOL 350 MG/ML SOLN COMPARISON:  None. FINDINGS: CTA NECK FINDINGS Aortic arch: Standard 3 vessel aortic arch with widely patent arch vessel origins. Right carotid system: The common carotid artery is patent. There is predominantly soft plaque at the carotid bifurcation, and the ICA is occluded proximally. Left carotid system: Patent without evidence of stenosis or dissection. Vertebral arteries: The right vertebral artery is patent and dominant without evidence of a significant stenosis or dissection. The left vertebral artery is occluded at its origin, and there is distal reconstitution of small V2 and V3  segments. Skeleton: No acute osseous abnormality or suspicious osseous lesion. Mild cervical spondylosis. Asymmetrically advanced left facet arthrosis at C2-3. Other neck: No evidence of cervical lymphadenopathy or mass. Upper chest: Clear lung apices. Review of the MIP images confirms the above findings CTA HEAD FINDINGS Anterior  circulation: There is reconstitution of the right ICA beginning in the mid to distal petrous segment with the vessel being patent more distally through the terminus though small in caliber and irregular including a possible severe distal cavernous segment stenosis. The right MCA is patent proximally, however there is diminished enhancement and poor delineation of the right MCA bifurcation suspicious for a subocclusive embolus or severe stenosis. The branches distal to this are grossly patent. The intracranial left ICA is patent with mild atherosclerotic plaque not resulting in a significant stenosis. The left MCA and left ACA are patent without evidence of a flow limiting proximal stenosis. The right ACA is patent although the right A1 segment is small and irregular with moderate narrowing in its midportion. No aneurysm is identified. Posterior circulation: The intracranial vertebral arteries are patent to the basilar. Patent PICA and SCA origins are seen bilaterally. The basilar artery is widely patent. There are small right and diminutive or absent left posterior communicating arteries. The PCAs are patent without evidence of a significant proximal stenosis. No aneurysm is identified. Venous sinuses: Patent. Anatomic variants: None. Review of the MIP images confirms the above findings CT Brain Perfusion Findings: ASPECTS: 8 CBF (<30%) Volume: 0mL Perfusion (Tmax>6.0s) volume: 25mL Mismatch Volume: 25mL Infarction Location: The core infarct in the right insula and right frontal operculum on the earlier noncontrast head CT is not detected by the automated RAPID processing. The 25 mL penumbra indicated by RAPID appears to be located slightly posterior to the infarct on CT. IMPRESSION: 1. 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 2. Finding suspicious for a subocclusive embolus versus severe stenosis at the right MCA bifurcation. 3. Occlusion of the left vertebral artery at its origin with distal  reconstitution. 4. CTP results as detailed above including a 25 mL penumbra. These results were communicated to Dr. Selina Cooley at 12:31 pm on 08/12/2020 by text page via the [facility] messaging system. Electronically Signed   By: Sebastian Ache M.D.   On: 08/12/2020 12:54    Labs:  CBC: Recent Labs    08/12/20 1148 08/13/20 0700  WBC 11.9* 8.6  HGB 13.9 13.2  HCT 41.6 40.4  PLT 336 350    COAGS: Recent Labs    08/12/20 1148  INR 1.0  APTT 26    BMP: Recent Labs    08/12/20 1148 08/13/20 0700  NA 136 139  K 3.8 3.6  CL 99 103  CO2 27 28  GLUCOSE 107* 115*  BUN 16 14  CALCIUM 9.4 9.2  CREATININE 0.62 0.84  GFRNONAA >60 >60    LIVER FUNCTION TESTS: Recent Labs    08/12/20 1148  BILITOT 1.3*  AST 21  ALT 22  ALKPHOS 70  PROT 7.7  ALBUMIN 4.3      Assessment and Plan:  History of hypothyroidism, spondylosis, migraines. Presented to the ED at [facility] on 8.7.22 with left sided facial droop and dysarthria. CT Head Stroke reads Acute infarct in the right insula and frontal operculum, ASPECTS is 8. CT angio head neck reads Proximal right ICA occlusion with intracranial reconstitution. 2. Finding suspicious for a subocclusive embolus versus severe stenosis at the right MCA bifurcation. 3. Occlusion of the left vertebral artery at its  origin with distal reconstitution. 4. CTP results as detailed above including a 25 mL penumbra. tPA was initiated and the patient was transferred to [facility] further evaluation and possible intervention. Upon arrival to [facility] Vicki Gonzales was reporting the worse head ache in her life. Second Head CT performed at [facility] reads New small superficial densities in the right frontal lobe, indeterminate for trace subarachnoid hemorrhage versus cortical contrast staining or cortical petechial hemorrhage. Due to this the thrombectomy was aborted. Patient seen by Dr. Julieanne Cotton at bedside this morning with husband presents. Left sided weakness  noted  (upper extremity > lower extremity). P er husband at bedside weakness is improving. He states that they patient has no slurred speech and is back at baseline. MR/MRA from 8.8.22 reads . Multifocal areas of right MCA territory infarct including a new area in the right perirolandic region when compared to prior CT. 2. Few small foci of susceptibility artifact within the areas of restricted diffusion corresponding to petechial hemorrhage. 3. Occlusion of the intracranial right ICA with reconstitution at the ophthalmic segment. 4. Occlusion of a right M3/MCA anterior division branch. 5. Stable trace right subarachnoid hemorrhage. 6. Moderate chronic white matter disease. Since study Patient has decompensated. She is now hypertensive  with greater left sided weakness and left sided neglect and decreased sensation. Patient seen at beside with Dr. Addison Lank, husband and son present second son on the telephone.. Per RN at bedside patient ate lunch.  IR consulted for possible Cerebral angiogram with possible intervention. Case has been reviewed and procedure approved by Dr. Dr. Julieanne Cotton. Patient tentatively scheduled for 8.9.22 with general anesthesia.  Team instructed to: Keep Patient to be NPO after midnight Should patient condition change please notify IR for emergent procedure.   Risks and benefits of right MCA - carotid arteriogram with intervention were discussed with the patient including, but not limited to bleeding, infection, vascular injury, contrast induced renal failure, stroke, reperfusion hemorrhage, or even death. This interventional procedure involves the use of X-rays and because of the nature of the planned procedure, it is possible that we will have prolonged use of X-ray fluoroscopy. Potential radiation risks to you include (but are not limited to) the following: - A slightly elevated risk for cancer  several years later in life. This risk is typically less than 0.5%  percent. This risk is low in comparison to the normal incidence of human cancer, which is 33% for women and 50% for men according to the [employer]. - Radiation induced injury can include skin redness, resembling a rash, tissue breakdown / ulcers and hair loss (which can be temporary or permanent).  The likelihood of either of these occurring depends on the difficulty of the procedure and whether you are sensitive to radiation due to previous procedures, disease, or genetic conditions.  IF your procedure requires a prolonged use of radiation, you will be notified and given written instructions for further action.  It is your responsibility to monitor the irradiated area for the 2 weeks following the procedure and to notify your physician if you are concerned that you have suffered a radiation induced injury.   All of the patient's questions were answered, patient is agreeable to proceed. Consent signed and in chart.   Thank you for this interesting consult.  I greatly enjoyed meeting Vicki Gonzales and look forward to participating in their care.  A copy of this report was sent to the  requesting provider on this date.  Electronically Signed: Alene Mires, NP 08/13/2020, 3:25 PM   I spent a total of 40 Minutes    in face to face in clinical consultation, greater than 50% of which was counseling/coordinating care for cerebral angiogram with possible intervention.

## 2020-08-13 NOTE — Progress Notes (Signed)
STROKE TEAM PROGRESS NOTE   INTERVAL HISTORY Her husband and son are at the bedside, another son on the phone.  Per RN, pt NIHSS before went to MRI. However, after MRI, she came back and wanted urination at bedside commode, with PT assistance, she was sitting on the commode but developed diaphoresis, nausea and lethargy, BP down to 80s. She was put back to bed in slight trendelenburg, BP improved but she has developed left sided weakness, UE > LE, left sensory neglect and left visual simultagnosia. Put on NS bolus and will give albumin also. Add ASA 81 and heparin subq. Will need to discuss with Dr. Corliss Skains about the timing of right ICA revascularization. MRI showed right MCA scattered moderate sized infarcts with persistent right ICA occlusion. Also left VA occlusion, age indeterminate. Pt denies head and neck trauma, miscarriage, hx of clotting disorder. Mom had stroke in 18s and had afib when she was older. Dad had heart attack. Hx of HTN and BP at home 150s - 160s. She also has undiagnosed OSA most likely.   Vitals:   08/13/20 0600 08/13/20 0630 08/13/20 0700 08/13/20 0800  BP: 129/79 136/77 127/65   Pulse: 64 65 68   Resp: Temp:    98.1 F (36.7 C)  TempSrc:    Oral  SpO2: 93% 94% 93%    CBC:  Recent Labs  Lab 08/12/20 1148  WBC 11.9*  NEUTROABS 8.8*  HGB 13.9  HCT 41.6  MCV 87.0  PLT 336   Basic Metabolic Panel:  Recent Labs  Lab 08/12/20 1148  NA 136  K 3.8  CL 99  CO2 27  GLUCOSE 107*  BUN 16  CREATININE 0.62  CALCIUM 9.4   Lipid Panel:  Recent Labs  Lab 08/12/20 1628  CHOL 253*  TRIG 217*  HDL 38*  CHOLHDL 6.7  VLDL 43*  LDLCALC 172*   HgbA1c:  Recent Labs  Lab 08/12/20 1628  HGBA1C 6.2*   Urine Drug Screen:  Recent Labs  Lab 08/12/20 1300  LABOPIA NONE DETECTED  COCAINSCRNUR NONE DETECTED  LABBENZ NONE DETECTED  AMPHETMU NONE DETECTED  THCU NONE DETECTED  LABBARB NONE DETECTED    Alcohol Level  Recent Labs  Lab 08/12/20 1148   ETH <10    IMAGING past 24 hours CT HEAD WO CONTRAST ( )  Result Date: 08/12/2020 CLINICAL DATA:  Cerebral hemorrhage suspected EXAM: CT HEAD WITHOUT CONTRAST TECHNIQUE: Contiguous axial images were obtained from the base of the skull through the vertex without intravenous contrast. COMPARISON:  None. FINDINGS: Brain: There is an early subacute infarct of the right frontal operculum and insula. Faint subarachnoid hemorrhage over the right frontal lobe is unchanged. There is no new site of hemorrhage. No midline shift or other mass effect. Vascular: No abnormal hyperdensity of the major intracranial arteries or dural venous sinuses. No intracranial atherosclerosis. Skull: The visualized skull base, calvarium and extracranial soft tissues are normal. Sinuses/Orbits: No fluid levels or advanced mucosal thickening of the visualized paranasal sinuses. No mastoid or middle ear effusion. The orbits are normal. IMPRESSION: 1. Unchanged appearance of early subacute infarct of the right frontal operculum and insula. 2. Unchanged faint subarachnoid hemorrhage over the right frontal lobe. 3. No new site of hemorrhage. Electronically Signed   By: Deatra Robinson M.D.   On: 08/12/2020 23:38   CT HEAD WO CONTRAST ( )  Result Date: 08/12/2020 CLINICAL DATA:  Follow-up subarachnoid hemorrhage EXAM: CT HEAD WITHOUT CONTRAST TECHNIQUE: Contiguous axial images  were obtained from the base of the skull through the vertex without intravenous contrast. COMPARISON:  08/12/2020 FINDINGS: Brain: There appears to continue to be a small amount of subarachnoid hemorrhage in the right frontal region, unchanged since prior study. No new areas of hemorrhage. No hydrocephalus or acute infarction. Vascular: No hyperdense vessel or unexpected calcification. Skull: No acute calvarial abnormality. Sinuses/Orbits: No acute findings Other: None IMPRESSION: Stable minimal subarachnoid hemorrhage in the right frontal region, unchanged.  Electronically Signed   By: Charlett Nose M.D.   On: 08/12/2020 18:49   CT HEAD WO CONTRAST ( )  Result Date: 08/12/2020 CLINICAL DATA:  Cerebral hemorrhage suspected. Follow-up hemorrhage status post tPA. EXAM: CT HEAD WITHOUT CONTRAST TECHNIQUE: Contiguous axial images were obtained from the base of the skull through the vertex without intravenous contrast. COMPARISON:  Head CT and MRI earlier today. FINDINGS: Brain: Minimal right frontal subarachnoid hemorrhage is unchanged in extent from the earlier CT though is slightly less conspicuous (potentially related to different scanners). No new intracranial hemorrhage, extra-axial fluid collection, mass, or midline shift is identified. Cytotoxic edema related to an acute right MCA infarct in the insula and frontal operculum is unchanged. The ventricles are normal in size. Vascular: Residual intravascular contrast material. Skull: No fracture or suspicious osseous lesion. Sinuses/Orbits: Minimal mucosal thickening in the right maxillary sinus. Clear mastoid air cells. Unremarkable orbits. Other: None. IMPRESSION: 1. Unchanged trace subarachnoid hemorrhage. 2. Unchanged acute right MCA infarct. Electronically Signed   By: Sebastian Ache M.D.   On: 08/12/2020 15:56   CT HEAD WO CONTRAST ( )  Result Date: 08/12/2020 CLINICAL DATA:  Cerebral hemorrhage suspected. Headache following tPA. EXAM: CT HEAD WITHOUT CONTRAST TECHNIQUE: Contiguous axial images were obtained from the base of the skull through the vertex without intravenous contrast. COMPARISON:  Head CT earlier today FINDINGS: Brain: Hypodensity in the right insula and right frontal operculum is similar in extent to today's earlier CT and consistent with an acute infarct. There is a small amount of scattered curvilinear hyperdensity in the right frontal lobe which is indeterminate for trace subarachnoid hemorrhage versus cortical contrast staining or cortical petechial hemorrhage. The ventricles are normal in  size. No mass, midline shift, or extra-axial fluid collection is identified. Vascular: Residual intravascular contrast from earlier CTA. Skull: No fracture or suspicious osseous lesion. Sinuses/Orbits: Visualized paranasal sinuses and mastoid air cells are clear. Unremarkable orbits. Other: None. IMPRESSION: 1. New small superficial densities in the right frontal lobe, indeterminate for trace subarachnoid hemorrhage versus cortical contrast staining or cortical petechial hemorrhage. 2. Unchanged extent of acute right MCA infarct involving the insula and frontal operculum. The study was discussed by telephone with Dr. Erick Blinks on 08/12/2020 at 2:15 p.m. Electronically Signed   By: Sebastian Ache M.D.   On: 08/12/2020 14:42   MR BRAIN WO CONTRAST  Result Date: 08/12/2020 CLINICAL DATA:  Brain mass or lesion. Code stroke. Trace subarachnoid hemorrhage versus cortical contrast staining or cortical petechial hemorrhage on CT following tPA. EXAM: MRI HEAD WITHOUT CONTRAST TECHNIQUE: Multiplanar, multiecho pulse sequences of the brain and surrounding structures were obtained without intravenous contrast. COMPARISON:  Head CT 08/12/2020 FINDINGS: A limited study consisting of only axial susceptibility weighted imaging was performed at the direction of the stroke neurologist. There is mild-to-moderate motion artifact, however curvilinear foci of susceptibility artifact are identified in several right frontal sulci most consistent with small volume acute subarachnoid hemorrhage when correlating with the preceding CT. There may also be minimal subarachnoid hemorrhage in the right  parietal region. IMPRESSION: Positive for small volume subarachnoid hemorrhage. These results were called by telephone at the time of interpretation on 08/12/2020 at 2:53 p.m. to Dr. Erick Blinks, who verbally acknowledged these results. Electronically Signed   By: Sebastian Ache M.D.   On: 08/12/2020 15:03   CT HEAD CODE STROKE WO  CONTRAST  Result Date: 08/12/2020 CLINICAL DATA:  Code stroke.  Slurred speech EXAM: CT HEAD WITHOUT CONTRAST TECHNIQUE: Contiguous axial images were obtained from the base of the skull through the vertex without intravenous contrast. COMPARISON:  None. FINDINGS: Brain: Cytotoxic edema appearance in the right insula and frontal operculum no ganglionic or supraganglionic infarct is seen. No acute hemorrhage, hydrocephalus, or collection. Vascular: Hyperdense right M1 suggested on sagittal images, although potentially accentuated by acute infarct in not persistent on the other planes. Skull: No acute finding Sinuses/Orbits: Negative Other: Critical Value/emergent results were called by telephone at the time of interpretation on 08/12/2020 at 11:43 am to provider Dorothea Glassman , who verbally acknowledged these results. ASPECTS [facility] Stroke Program Early CT Score) - Ganglionic level infarction (caudate, lentiform nuclei, internal capsule, insula, M1-M3 cortex): 5 - Supraganglionic infarction (M4-M6 cortex): 3 Total score (0-10 with 10 being normal): 8 IMPRESSION: 1. Acute infarct in the right insula and frontal operculum, ASPECTS is 8. 2.  Equivocal hyperdensity of the right M1. 3. No acute hemorrhage. Electronically Signed   By: Marnee Spring M.D.   On: 08/12/2020 11:45   CT ANGIO HEAD NECK W WO CM W PERF (CODE STROKE)  Result Date: 08/12/2020 CLINICAL DATA:  Slurred speech. EXAM: CT ANGIOGRAPHY HEAD AND NECK CT PERFUSION BRAIN TECHNIQUE: Multidetector CT imaging of the head and neck was performed using the standard protocol during bolus administration of intravenous contrast. Multiplanar CT image reconstructions and MIPs were obtained to evaluate the vascular anatomy. Carotid stenosis measurements (when applicable) are obtained utilizing NASCET criteria, using the distal internal carotid diameter as the denominator. Multiphase CT imaging of the brain was performed following IV bolus contrast injection.  Subsequent parametric perfusion maps were calculated using RAPID software. CONTRAST:  OMNIPAQUE IOHEXOL 350 MG/ML SOLN COMPARISON:  None. FINDINGS: CTA NECK FINDINGS Aortic arch: Standard 3 vessel aortic arch with widely patent arch vessel origins. Right carotid system: The common carotid artery is patent. There is predominantly soft plaque at the carotid bifurcation, and the ICA is occluded proximally. Left carotid system: Patent without evidence of stenosis or dissection. Vertebral arteries: The right vertebral artery is patent and dominant without evidence of a significant stenosis or dissection. The left vertebral artery is occluded at its origin, and there is distal reconstitution of small V2 and V3 segments. Skeleton: No acute osseous abnormality or suspicious osseous lesion. Mild cervical spondylosis. Asymmetrically advanced left facet arthrosis at C2-3. Other neck: No evidence of cervical lymphadenopathy or mass. Upper chest: Clear lung apices. Review of the MIP images confirms the above findings CTA HEAD FINDINGS Anterior circulation: There is reconstitution of the right ICA beginning in the mid to distal petrous segment with the vessel being patent more distally through the terminus though small in caliber and irregular including a possible severe distal cavernous segment stenosis. The right MCA is patent proximally, however there is diminished enhancement and poor delineation of the right MCA bifurcation suspicious for a subocclusive embolus or severe stenosis. The branches distal to this are grossly patent. The intracranial left ICA is patent with mild atherosclerotic plaque not resulting in a significant stenosis. The left MCA and left ACA are  patent without evidence of a flow limiting proximal stenosis. The right ACA is patent although the right A1 segment is small and irregular with moderate narrowing in its midportion. No aneurysm is identified. Posterior circulation: The intracranial vertebral  arteries are patent to the basilar. Patent PICA and SCA origins are seen bilaterally. The basilar artery is widely patent. There are small right and diminutive or absent left posterior communicating arteries. The PCAs are patent without evidence of a significant proximal stenosis. No aneurysm is identified. Venous sinuses: Patent. Anatomic variants: None. Review of the MIP images confirms the above findings CT Brain Perfusion Findings: ASPECTS: 8 CBF (<30%) Volume: 43mL Perfusion (Tmax>6.0s) volume: 65mL Mismatch Volume: 57mL Infarction Location: The core infarct in the right insula and right frontal operculum on the earlier noncontrast head CT is not detected by the automated RAPID processing. The 25 mL penumbra indicated by RAPID appears to be located slightly posterior to the infarct on CT. IMPRESSION: 1. 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 2. Finding suspicious for a subocclusive embolus versus severe stenosis at the right MCA bifurcation. 3. Occlusion of the left vertebral artery at its origin with distal reconstitution. 4. CTP results as detailed above including a 25 mL penumbra. These results were communicated to Dr. Selina Cooley at 12:31 pm on 08/12/2020 by text page via the [facility] messaging system. Electronically Signed   By: Sebastian Ache M.D.   On: 08/12/2020 12:54    PHYSICAL EXAM  Temp:  [98.1 F (36.7 C)-100.6 F (38.1 C)] 98.7 F (37.1 C) (08/08 1200) Pulse Rate:  [57-75] 66 (08/08 1318) Resp:  [12-24] 19 (08/08 1146) BP: (80-208)/(47-110) 111/61 (08/08 1318) SpO2:  [90 %-97 %] 93 % (08/08 1318)  General - Well nourished, well developed, in no apparent distress, but mildly lethargic.  Ophthalmologic - fundi not visualized due to noncooperation.  Cardiovascular - Regular rhythm and rate.  Mental Status -  Level of arousal and orientation to time, place, and person were intact. Language including expression, naming, repetition, comprehension was assessed and found  intact.  Cranial Nerves II - XII - II - Visual field intact OU, but with intermittent left simultagnosia. III, IV, VI - Extraocular movements intact. V - Facial sensation intact bilaterally. VII - mild left nasolabial fold flattening. Left eye closure weaker than the right VIII - Hearing & vestibular intact bilaterally. X - Palate elevates symmetrically. XI - Chin turning & shoulder shrug intact bilaterally. XII - Tongue protrusion intact.  Motor Strength - The patient's strength was normal in right UE and LE, but left UE 2+/5 and left LE 3-/5.  Bulk was normal and fasciculations were absent.   Motor Tone - Muscle tone was assessed at the neck and appendages and was normal.  Reflexes - The patient's reflexes were symmetrical in all extremities and she had no pathological reflexes.  Sensory - Light touch, temperature/pinprick were assessed and were symmetrical subjectively but left sensory neglect.    Coordination - The patient had normal movements in the right hand with no ataxia or dysmetria.  Tremor was absent.  Gait and Station - deferred.   ASSESSMENT/PLAN [name] is a 55 y.o. female with history of HTN, migraine presenting with left facial droop and slurry speech.   Stroke:  right MCA infarct due to right ICA occlusion s/p tPA, etiology unclear CT head code stroke Acute infarct in the right insula and frontal operculum, ASPECTS is 8. CTA head and neck - right ICA occlusion and right  M1/M2 non-occlusive clot vs. High grade stenosis Serial CT Head stable small SAH right frontal. MRI SWI only - Positive for small volume subarachnoid hemorrhage. MRI: repeat: right MCA multifocal moderate sized infarcts, stable trace SAH right frontal.  MRA  occlusion of right ICA reconstitution at ophthalmic segment. Occlusion of right M3.  Left VA occlusion, age indeterminate 2D Echo EF 60-65% LDL 172 HgbA1c 6.2 VTE prophylaxis - heparin subq No antithrombotic prior to  admission, now on ASA 81 given small SAH Therapy recommendations:  pending Disposition:  pending  Trace right frontal SAH post tPA Fibrinogen level 248 Serial CT stable SAH No reversal needed MRI showed trace SAH right frontal  Right ICA occlusion Discussing with Dr. Corliss Skainseveshwar  plan for revascularization of RICA in am Will put on brilinta load, continue ASA 81  Orthostatic hypotension Patient new or worsening after sitting up at bedside commode, BP down to 80s, put back in bed, symptoms improving 1 L normal saline bolus stat Albumin every 6 hours for 4 doses Avoid low BP Bedrest for 24 hours  Hypertension Home meds:  zestoretic Stable BP goal 130-160 given right ICA occlusion but trace SAH Avoid low BP Long-term BP goal normotensive  Hyperlipidemia Home meds:  none LDL 172, goal < 70 Add atorvastatin 80 mg daily  Continue statin at discharge  Other Stroke Risk Factors Former cigarette smoker, advised to stay a non-smoker ETOH use, alcohol level <10, advised to drink no more than 1 drink a day Migraines   Hospital day # 1  This patient is critically ill due to right MCA stroke, right ICA occlusion, trace SAH right frontal, orthostatic hypotension with worsening neuro symptoms and at significant risk of neurological worsening, death form recurrent stroke, hemorrhagic conversion, seizure. This patient's care requires constant monitoring of vital signs, hemodynamics, respiratory and cardiac monitoring, review of multiple databases, neurological assessment, discussion with family, other specialists and medical decision making of high complexity. I spent 55 minutes of neurocritical care time in the care of this patient. I had long discussion with husband and 2 sons, updated pt current condition, treatment plan and potential prognosis, and answered all the questions.  They expressed understanding and appreciation.   Marvel PlanJindong Jarek Longton, MD PhD Stroke Neurology 08/13/2020 4:38 PM  To  contact Stroke Continuity provider, please refer to [email or web address]. After hours, contact General Neurology

## 2020-08-14 ENCOUNTER — Inpatient Hospital Stay (HOSPITAL_COMMUNITY): Payer: BC Managed Care – PPO | Admitting: Certified Registered"

## 2020-08-14 ENCOUNTER — Inpatient Hospital Stay (HOSPITAL_COMMUNITY): Payer: BC Managed Care – PPO

## 2020-08-14 ENCOUNTER — Encounter (HOSPITAL_COMMUNITY)
Admission: EM | Disposition: A | Discharge status: Rehabilitation Facility | Payer: Self-pay | Source: Home / Self Care | Attending: Neurology

## 2020-08-14 ENCOUNTER — Encounter (HOSPITAL_COMMUNITY): Payer: Self-pay | Admitting: Neurology

## 2020-08-14 ENCOUNTER — Encounter (HOSPITAL_COMMUNITY): Payer: BC Managed Care – PPO

## 2020-08-14 DIAGNOSIS — E7800 Pure hypercholesterolemia, unspecified: Secondary | ICD-10-CM

## 2020-08-14 DIAGNOSIS — I6521 Occlusion and stenosis of right carotid artery: Secondary | ICD-10-CM | POA: Diagnosis present

## 2020-08-14 DIAGNOSIS — I609 Nontraumatic subarachnoid hemorrhage, unspecified: Secondary | ICD-10-CM

## 2020-08-14 HISTORY — PX: RADIOLOGY WITH ANESTHESIA: SHX6223

## 2020-08-14 HISTORY — PX: IR ANGIO INTRA EXTRACRAN SEL INTERNAL CAROTID UNI R MOD SED: IMG5362

## 2020-08-14 HISTORY — PX: IR INTRAVSC STENT CERV CAROTID W/O EMB-PROT MOD SED INC ANGIO: IMG2304

## 2020-08-14 HISTORY — PX: IR CT HEAD LTD: IMG2386

## 2020-08-14 HISTORY — PX: IR ANGIO INTRA EXTRACRAN SEL COM CAROTID INNOMINATE UNI L MOD SED: IMG5358

## 2020-08-14 HISTORY — PX: IR ANGIO VERTEBRAL SEL SUBCLAVIAN INNOMINATE BILAT MOD SED: IMG5366

## 2020-08-14 HISTORY — PX: IR INTRA CRAN STENT: IMG2345

## 2020-08-14 LAB — CBC WITH DIFFERENTIAL/PLATELET
Abs Immature Granulocytes: 0.04 K/uL (ref 0.00–0.07)
Basophils Absolute: 0.1 K/uL (ref 0.0–0.1)
Basophils Relative: 1 %
Eosinophils Absolute: 0.1 K/uL (ref 0.0–0.5)
Eosinophils Relative: 1 %
HCT: 37.1 % (ref 36.0–46.0)
Hemoglobin: 12.6 g/dL (ref 12.0–15.0)
Immature Granulocytes: 0 %
Lymphocytes Relative: 18 %
Lymphs Abs: 1.9 K/uL (ref 0.7–4.0)
MCH: 29.1 pg (ref 26.0–34.0)
MCHC: 34 g/dL (ref 30.0–36.0)
MCV: 85.7 fL (ref 80.0–100.0)
Monocytes Absolute: 0.7 K/uL (ref 0.1–1.0)
Monocytes Relative: 7 %
Neutro Abs: 7.6 K/uL (ref 1.7–7.7)
Neutrophils Relative %: 73 %
Platelets: 307 K/uL (ref 150–400)
RBC: 4.33 MIL/uL (ref 3.87–5.11)
RDW: 14.2 % (ref 11.5–15.5)
WBC: 10.4 K/uL (ref 4.0–10.5)
nRBC: 0 % (ref 0.0–0.2)

## 2020-08-14 LAB — PLATELET INHIBITION P2Y12: Platelet Function  P2Y12: 27 [PRU] — ABNORMAL LOW (ref 182–335)

## 2020-08-14 LAB — BASIC METABOLIC PANEL WITH GFR
Anion gap: 9 (ref 5–15)
BUN: 8 mg/dL (ref 6–20)
CO2: 22 mmol/L (ref 22–32)
Calcium: 8.9 mg/dL (ref 8.9–10.3)
Chloride: 110 mmol/L (ref 98–111)
Creatinine, Ser: 0.63 mg/dL (ref 0.44–1.00)
GFR, Estimated: >60 mL/min
Glucose, Bld: 125 mg/dL — ABNORMAL HIGH (ref 70–99)
Potassium: 3.3 mmol/L — ABNORMAL LOW (ref 3.5–5.1)
Sodium: 141 mmol/L (ref 135–145)

## 2020-08-14 LAB — POCT PREGNANCY, URINE: Preg Test, Ur: NEGATIVE

## 2020-08-14 LAB — HEPARIN LEVEL (UNFRACTIONATED): Heparin Unfractionated: 0.26 IU/mL — ABNORMAL LOW (ref 0.30–0.70)

## 2020-08-14 LAB — PROTIME-INR
INR: 1 (ref 0.8–1.2)
Prothrombin Time: 13.4 s (ref 11.4–15.2)

## 2020-08-14 LAB — POCT ACTIVATED CLOTTING TIME
Activated Clotting Time: 185 seconds
Activated Clotting Time: 219 seconds

## 2020-08-14 SURGERY — IR WITH ANESTHESIA
Anesthesia: General

## 2020-08-14 MED ORDER — PHENYLEPHRINE HCL (PRESSORS) 10 MG/ML IV SOLN
INTRAVENOUS | Status: DC | PRN
  Administered 2020-08-14: 40 ug via INTRAVENOUS

## 2020-08-14 MED ORDER — ASPIRIN 81 MG PO CHEW
81.0000 mg | CHEWABLE_TABLET | Freq: Every day | ORAL | Status: DC
  Administered 2020-08-15 – 2020-08-22 (×8): 81 mg via ORAL
  Filled 2020-08-14 (×8): qty 1

## 2020-08-14 MED ORDER — SODIUM CHLORIDE 0.9 % IV SOLN: INTRAVENOUS | Status: DC | PRN

## 2020-08-14 MED ORDER — TICAGRELOR 90 MG PO TABS
90.0000 mg | ORAL_TABLET | Freq: Two times a day (BID) | ORAL | Status: DC
  Administered 2020-08-14 – 2020-08-22 (×16): 90 mg via ORAL
  Filled 2020-08-14 (×16): qty 1

## 2020-08-14 MED ORDER — TICAGRELOR 90 MG PO TABS: 90.0000 mg | ORAL_TABLET | Freq: Two times a day (BID) | ORAL | Status: DC

## 2020-08-14 MED ORDER — FENTANYL CITRATE (PF) 100 MCG/2ML IJ SOLN
INTRAMUSCULAR | Status: DC | PRN
  Administered 2020-08-14 (×2): 50 ug via INTRAVENOUS

## 2020-08-14 MED ORDER — IOHEXOL 300 MG/ML  SOLN
100.0000 mL | Freq: Once | INTRAMUSCULAR | Status: AC | PRN
  Administered 2020-08-14: 55 mL via INTRA_ARTERIAL

## 2020-08-14 MED ORDER — ACETAMINOPHEN 650 MG RE SUPP: 650.0000 mg | RECTAL | Status: DC | PRN

## 2020-08-14 MED ORDER — ACETAMINOPHEN 160 MG/5ML PO SOLN: 650.0000 mg | ORAL | Status: DC | PRN

## 2020-08-14 MED ORDER — SODIUM CHLORIDE 0.9 % IV SOLN
INTRAVENOUS | Status: DC
  Administered 2020-08-17: 500 mL via INTRAVENOUS

## 2020-08-14 MED ORDER — ACETAMINOPHEN 325 MG PO TABS
650.0000 mg | ORAL_TABLET | ORAL | Status: DC | PRN
  Administered 2020-08-14 – 2020-08-22 (×5): 650 mg via ORAL
  Filled 2020-08-14 (×4): qty 2

## 2020-08-14 MED ORDER — VERAPAMIL HCL 2.5 MG/ML IV SOLN
INTRAVENOUS | Status: AC
  Filled 2020-08-14: qty 2

## 2020-08-14 MED ORDER — CLEVIDIPINE BUTYRATE 0.5 MG/ML IV EMUL
INTRAVENOUS | Status: AC
  Filled 2020-08-14: qty 50

## 2020-08-14 MED ORDER — PROPOFOL 10 MG/ML IV BOLUS
INTRAVENOUS | Status: DC | PRN
  Administered 2020-08-14: 120 mg via INTRAVENOUS
  Administered 2020-08-14: 50 mg via INTRAVENOUS
  Administered 2020-08-14: 30 mg via INTRAVENOUS

## 2020-08-14 MED ORDER — NITROGLYCERIN 1 MG/10 ML FOR IR/CATH LAB
INTRA_ARTERIAL | Status: AC
  Filled 2020-08-14: qty 10

## 2020-08-14 MED ORDER — LIDOCAINE 2% (20 MG/ML) 5 ML SYRINGE
INTRAMUSCULAR | Status: DC | PRN
  Administered 2020-08-14: 80 mg via INTRAVENOUS

## 2020-08-14 MED ORDER — ASPIRIN 81 MG PO CHEW: 81.0000 mg | CHEWABLE_TABLET | Freq: Every day | ORAL | Status: DC

## 2020-08-14 MED ORDER — HEPARIN SODIUM (PORCINE) 1000 UNIT/ML IJ SOLN
INTRAMUSCULAR | Status: DC | PRN
  Administered 2020-08-14: 500 [IU] via INTRAVENOUS
  Administered 2020-08-14: 3000 [IU] via INTRAVENOUS

## 2020-08-14 MED ORDER — HEPARIN (PORCINE) 25000 UT/250ML-% IV SOLN
550.0000 [IU]/h | INTRAVENOUS | Status: DC
  Filled 2020-08-14: qty 250

## 2020-08-14 MED ORDER — PHENYLEPHRINE HCL-NACL 20-0.9 MG/250ML-% IV SOLN
INTRAVENOUS | Status: DC | PRN
  Administered 2020-08-14: 40 ug/min via INTRAVENOUS

## 2020-08-14 MED ORDER — EPTIFIBATIDE 20 MG/10ML IV SOLN
INTRAVENOUS | Status: DC | PRN
  Administered 2020-08-14 (×3): 1.5 mg via INTRA_ARTERIAL

## 2020-08-14 MED ORDER — IOHEXOL 300 MG/ML  SOLN
100.0000 mL | Freq: Once | INTRAMUSCULAR | Status: AC | PRN
  Administered 2020-08-14: 75 mL via INTRA_ARTERIAL

## 2020-08-14 MED ORDER — CHLORHEXIDINE GLUCONATE 0.12 % MT SOLN
15.0000 mL | Freq: Once | OROMUCOSAL | Status: AC
  Administered 2020-08-14: 15 mL via OROMUCOSAL
  Filled 2020-08-14: qty 15

## 2020-08-14 MED ORDER — HEPARIN (PORCINE) 25000 UT/250ML-% IV SOLN
500.0000 [IU]/h | INTRAVENOUS | Status: DC
  Filled 2020-08-14: qty 250

## 2020-08-14 MED ORDER — VERAPAMIL HCL 2.5 MG/ML IV SOLN
INTRAVENOUS | Status: DC | PRN
  Administered 2020-08-14: 2.5 mg via INTRA_ARTERIAL

## 2020-08-14 MED ORDER — HEPARIN (PORCINE) 25000 UT/250ML-% IV SOLN
650.0000 [IU]/h | INTRAVENOUS | Status: DC
  Administered 2020-08-14: 650 [IU]/h via INTRAVENOUS
  Filled 2020-08-14: qty 250

## 2020-08-14 MED ORDER — TRAMADOL HCL 50 MG PO TABS
50.0000 mg | ORAL_TABLET | Freq: Four times a day (QID) | ORAL | Status: DC | PRN
  Administered 2020-08-14 – 2020-08-21 (×7): 50 mg via ORAL
  Filled 2020-08-14 (×9): qty 1

## 2020-08-14 MED ORDER — FENTANYL CITRATE (PF) 250 MCG/5ML IJ SOLN: INTRAMUSCULAR | Status: DC | PRN

## 2020-08-14 MED ORDER — ORAL CARE MOUTH RINSE: 15.0000 mL | Freq: Once | OROMUCOSAL | Status: AC

## 2020-08-14 MED ORDER — LACTATED RINGERS IV SOLN: INTRAVENOUS | Status: DC

## 2020-08-14 MED ORDER — GLYCOPYRROLATE 0.2 MG/ML IJ SOLN
INTRAMUSCULAR | Status: DC | PRN
  Administered 2020-08-14 (×2): .2 mg via INTRAVENOUS

## 2020-08-14 MED ORDER — EPTIFIBATIDE 20 MG/10ML IV SOLN
INTRAVENOUS | Status: AC
  Filled 2020-08-14: qty 10

## 2020-08-14 MED ORDER — EPHEDRINE SULFATE 50 MG/ML IJ SOLN
INTRAMUSCULAR | Status: DC | PRN
  Administered 2020-08-14: 5 mg via INTRAVENOUS
  Administered 2020-08-14: 10 mg via INTRAVENOUS
  Administered 2020-08-14: 5 mg via INTRAVENOUS

## 2020-08-14 MED ORDER — HEPARIN (PORCINE) 25000 UT/250ML-% IV SOLN
INTRAVENOUS | Status: AC
  Filled 2020-08-14: qty 250

## 2020-08-14 MED ORDER — LIDOCAINE HCL 1 % IJ SOLN
INTRAMUSCULAR | Status: AC
  Filled 2020-08-14: qty 20

## 2020-08-14 MED ORDER — ONDANSETRON HCL 4 MG/2ML IJ SOLN
INTRAMUSCULAR | Status: DC | PRN
  Administered 2020-08-14: 4 mg via INTRAVENOUS

## 2020-08-14 MED ORDER — ROCURONIUM BROMIDE 10 MG/ML (PF) SYRINGE
PREFILLED_SYRINGE | INTRAVENOUS | Status: DC | PRN
  Administered 2020-08-14: 20 mg via INTRAVENOUS
  Administered 2020-08-14: 40 mg via INTRAVENOUS
  Administered 2020-08-14: 60 mg via INTRAVENOUS

## 2020-08-14 MED ORDER — CLEVIDIPINE BUTYRATE 0.5 MG/ML IV EMUL
0.0000 mg/h | INTRAVENOUS | Status: AC
  Administered 2020-08-14: 4 mg/h via INTRAVENOUS
  Filled 2020-08-14 (×3): qty 50

## 2020-08-14 MED ORDER — NITROGLYCERIN 1 MG/10 ML FOR IR/CATH LAB
INTRA_ARTERIAL | Status: DC | PRN
  Administered 2020-08-14 (×6): 25 ug via INTRA_ARTERIAL

## 2020-08-14 NOTE — Progress Notes (Signed)
OT Cancellation Note  Patient Details Name: Talayah Picardi MRN: [id number] DOB: 04-04-1965   Cancelled Treatment:    Reason Eval/Treat Not Completed: Patient not medically ready (pending IR today will hold until after procedure)  Wynona Neat, OTR/L  Acute Rehabilitation Services Pager: [phone] Office: [phone] .  08/14/2020, 8:58 AM

## 2020-08-14 NOTE — Sedation Documentation (Signed)
Right femoral sheath removed, exoseal closure device deployed to right groin.

## 2020-08-14 NOTE — Progress Notes (Signed)
At 1830 site check, noticed bruising extending beyond marked boundary.  Site where bruising extending is firm to touch.  Began holding pressure at 1830.  Called Dr. Corliss Skains to inform him of the events.  He said to hold pressure for a total of 15-20 minutes, keep right leg completely straight.  He said to hold Heparin for 1 hr.  Heparin was held at 1845.    Neuro status, distal pulse, and blood pressure all unchanged.

## 2020-08-14 NOTE — Anesthesia Preprocedure Evaluation (Addendum)
Anesthesia Evaluation    Reviewed: Allergy & Precautions, H&P , Patient's Chart, lab work & pertinent test results, Unable to perform ROS - Chart review only  Airway Mallampati: II       Dental no notable dental hx. (+) Teeth Intact, Dental Advisory Given   Pulmonary neg pulmonary ROS, former smoker,    Pulmonary exam normal breath sounds clear to auscultation       Cardiovascular hypertension, Pt. on medications Normal cardiovascular exam Rhythm:Regular Rate:Normal     Neuro/Psych  Headaches, PSYCHIATRIC DISORDERS Anxiety Depression Dysarthria and left sided weakness UE>LE Code Stroke 2 days ago  Right MCA infarct  Neuromuscular disease CVA, Residual Symptoms    GI/Hepatic negative GI ROS, Neg liver ROS,   Endo/Other  Hypothyroidism   Renal/GU negative Renal ROS  negative genitourinary   Musculoskeletal  (+) Arthritis ,   Abdominal (+) + obese,   Peds  Hematology negative hematology ROS (+)   Anesthesia Other Findings   Reproductive/Obstetrics negative OB ROS                            Anesthesia Physical  Anesthesia Plan  ASA: 3  Anesthesia Plan: General   Post-op Pain Management:    Induction: Intravenous  PONV Risk Score and Plan: 3 and Ondansetron, Dexamethasone and Treatment may vary due to age or medical condition  Airway Management Planned: Oral ETT  Additional Equipment: Arterial line  Intra-op Plan:   Post-operative Plan: Possible Post-op intubation/ventilation  Informed Consent: I have reviewed the patients History and Physical, chart, labs and discussed the procedure including the risks, benefits and alternatives for the proposed anesthesia with the patient or authorized representative who has indicated his/her understanding and acceptance.     Dental advisory given  Plan Discussed with: CRNA and Anesthesiologist  Anesthesia Plan Comments:         Anesthesia Quick Evaluation

## 2020-08-14 NOTE — Progress Notes (Signed)
SLP Cancellation Note  Patient Details Name: Vicki Gonzales MRN: [id number] DOB: [birth year]   Cancelled treatment:        Attempted to see pt for cognitive-linguistic evaluation.  Pt off floor for procedure at time of attempt.  SLP will reattempt as schedule permits.   Kerrie Pleasure, MA, CCC-SLP Acute Rehabilitation Services Office: [phone] 08/14/2020, 11:19 AM

## 2020-08-14 NOTE — Progress Notes (Signed)
ANTICOAGULATION CONSULT NOTE - Follow Up Consult  Pharmacy Consult for heparin Indication:  s/p neuro IR  Labs: Recent Labs    08/12/20 1148 08/13/20 0700 08/14/20 0340 08/14/20 2145  HGB 13.9 13.2 12.6  --   HCT 41.6 40.4 37.1  --   PLT 336 350 307  --   APTT 26  --   --   --   LABPROT 13.6  --  13.4  --   INR 1.0  --  1.0  --   HEPARINUNFRC  --   --   --  0.26*  CREATININE 0.62 0.84 0.63  --     Assessment: 55yo female slightly supratherapeutic on heparin with initial dosing s/p neuro procedure; no infusion issues or signs of bleeding per RN.  Goal of Therapy:  aPTT 0.1-0.25 seconds   Plan:  Will decrease heparin infusion by 1-2 units/kg/hr to 550 units/hr until off in am.    Vernard Gambles, PharmD, BCPS  08/14/2020,11:42 PM

## 2020-08-14 NOTE — Progress Notes (Signed)
Inpatient Rehab Admissions Coordinator:   I am following for potential CIR admit, Pt. Currently in IR for procedure, not medically ready for CIR at this time. I will follow for potential admit pending medical readiness, insurance auth, and bed availability.   Megan Salon, MS, CCC-SLP Rehab Admissions Coordinator  [phone] (celll) [phone] (office)

## 2020-08-14 NOTE — Progress Notes (Signed)
STROKE TEAM PROGRESS NOTE   INTERVAL HISTORY Her husband and son are at the bedside.  Patient awake alert, receiving IV fluid and albumin overnight.  BP 140s.  Her left arm and leg weakness much improved from yesterday morning.  Plan to have right ICA revascularization with Dr. Corliss Skains today.  Vitals:   08/14/20 1645 08/14/20 1700 08/14/20 1715 08/14/20 1730  BP:      Pulse: 64 66 65 70  Resp: 16 16 16 18   Temp:      TempSrc:      SpO2: 92% 98% 96% 98%   CBC:  Recent Labs  Lab 08/12/20 1148 08/13/20 0700 08/14/20 0340  WBC 11.9* 8.6 10.4  NEUTROABS 8.8*  --  7.6  HGB 13.9 13.2 12.6  HCT 41.6 40.4 37.1  MCV 87.0 86.5 85.7  PLT 336 350 307   Basic Metabolic Panel:  Recent Labs  Lab 08/13/20 0700 08/14/20 0340  NA 139 141  K 3.6 3.3*  CL 103 110  CO2 28 22  GLUCOSE 115* 125*  BUN 14 8  CREATININE 0.84 0.63  CALCIUM 9.2 8.9   Lipid Panel:  Recent Labs  Lab 08/12/20 1628  CHOL 253*  TRIG 217*  HDL 38*  CHOLHDL 6.7  VLDL 43*  LDLCALC 172*   HgbA1c:  Recent Labs  Lab 08/12/20 1628  HGBA1C 6.2*   Urine Drug Screen:  Recent Labs  Lab 08/12/20 1300  LABOPIA NONE DETECTED  COCAINSCRNUR NONE DETECTED  LABBENZ NONE DETECTED  AMPHETMU NONE DETECTED  THCU NONE DETECTED  LABBARB NONE DETECTED    Alcohol Level  Recent Labs  Lab 08/12/20 1148  ETH <10    IMAGING past 24 hours No results found.  PHYSICAL EXAM  Temp:  [97.2 F (36.2 C)-99.1 F (37.3 C)] 98 F (36.7 C) (08/09 1556) Pulse Rate:  [60-80] 70 (08/09 1730) Resp:  [9-26] 18 (08/09 1730) BP: (91-167)/(33-126) 112/61 (08/09 1556) SpO2:  [90 %-99 %] 98 % (08/09 1730) Arterial Line BP: (124-138)/(62-74) 131/64 (08/09 1730)  General - Well nourished, well developed, in no apparent distress.  Ophthalmologic - fundi not visualized due to noncooperation.  Cardiovascular - Regular rhythm and rate.  Mental Status -  Level of arousal and orientation to time, place, and person were  intact. Language including expression, naming, repetition, comprehension was assessed and found intact.  Cranial Nerves II - XII - II - Visual field intact OU. III, IV, VI - Extraocular movements intact. V - Facial sensation intact bilaterally. VII - mild left nasolabial fold flattening. VIII - Hearing & vestibular intact bilaterally. X - Palate elevates symmetrically. XI - Chin turning & shoulder shrug intact bilaterally. XII - Tongue protrusion intact.  Motor Strength - The patient's strength was normal in all extremities except left upper extremity pronator drift.  Bulk was normal and fasciculations were absent.   Motor Tone - Muscle tone was assessed at the neck and appendages and was normal.  Reflexes - The patient's reflexes were symmetrical in all extremities and she had no pathological reflexes.  Sensory - Light touch, temperature/pinprick were assessed and showed left upper extremity sensory loss, left lower extremity decreased light touch sensation.  Left sensory neglect.  Coordination - The patient had normal movements in the hands with no ataxia or dysmetria.  Tremor was absent.  Gait and Station - deferred.   ASSESSMENT/PLAN [name] is a 55 y.o. female with history of HTN, migraine presenting with left facial droop and slurry  speech.   Stroke:  right MCA infarct due to right ICA occlusion s/p tPA, etiology unclear CT head code stroke Acute infarct in the right insula and frontal operculum, ASPECTS is 8. CTA head and neck - right ICA occlusion and right M1/M2 non-occlusive clot vs. High grade stenosis Serial CT Head stable small SAH right frontal. MRI SWI only - Positive for small volume subarachnoid hemorrhage. MRI: repeat: right MCA multifocal moderate sized infarcts, stable trace SAH right frontal.  MRA  occlusion of right ICA reconstitution at ophthalmic segment. Occlusion of right M3.  Left VA occlusion, age indeterminate 2D Echo EF 60-65% LDL  172 HgbA1c 6.2 VTE prophylaxis - heparin subq No antithrombotic prior to admission, now on ASA 81 and Brilinta in provision for right ICA revascularization Therapy recommendations:  pending Disposition:  pending  Trace right frontal SAH post tPA Fibrinogen level 248 Serial CT stable SAH No reversal needed MRI showed trace SAH right frontal  Right ICA occlusion Discussing with Dr. Corliss Skains  plan for revascularization of RICA today On aspirin and brilinta   Orthostatic hypotension Patient new or worsening after sitting up at bedside commode, BP down to 80s, put back in bed, symptoms improving 1 L normal saline bolus stat 8/8 Albumin every 6 hours for 4 doses 8/8 Avoid low BP  Hypertension Home meds:  zestoretic Stable BP goal 130-160 given right ICA occlusion but trace SAH Avoid low BP Long-term BP goal normotensive  Hyperlipidemia Home meds:  none LDL 172, goal < 70 Add atorvastatin 80 mg daily  Continue statin at discharge  Other Stroke Risk Factors Former cigarette smoker, advised to stay a non-smoker ETOH use, alcohol level <10, advised to drink no more than 1 drink a day Migraines   Hospital day # 2  This patient is critically ill due to right MCA stroke, status post tPA, right ICA occlusion, small SAH, orthostatic hypotension with worsening neuro symptoms and at significant risk of neurological worsening, death form recurrent stroke, hemorrhagic conversion, seizure. This patient's care requires constant monitoring of vital signs, hemodynamics, respiratory and cardiac monitoring, review of multiple databases, neurological assessment, discussion with family, other specialists and medical decision making of high complexity. I spent 45 minutes of neurocritical care time in the care of this patient. I had long discussion with husband and son at bedside, updated pt current condition, treatment plan and potential prognosis, and answered all the questions.  They expressed  understanding and appreciation.  I also discussed with Dr. Charlynne Pander, MD PhD Stroke Neurology 08/14/2020 5:57 PM  To contact Stroke Continuity provider, please refer to [email or web address]. After hours, contact General Neurology

## 2020-08-14 NOTE — Progress Notes (Signed)
Orthopedic Tech Progress Note Patient Details:  Vicki Gonzales [birth year] [id number]  Ortho Devices Type of Ortho Device: Knee Immobilizer Ortho Device/Splint Location: RLE Ortho Device/Splint Interventions: Ordered, Application, Adjustment   Post Interventions Patient Tolerated: Well Instructions Provided: Care of device  Donald Pore 08/14/2020, 3:33 PM

## 2020-08-14 NOTE — Anesthesia Procedure Notes (Signed)
Procedure Name: Intubation Date/Time: 08/14/2020 11:31 AM Performed by: Lucinda Dell, CRNA Pre-anesthesia Checklist: Patient identified, Emergency Drugs available, Suction available and Patient being monitored Patient Re-evaluated:Patient Re-evaluated prior to induction Oxygen Delivery Method: Circle system utilized Preoxygenation: Pre-oxygenation with 100% oxygen Induction Type: IV induction Ventilation: Mask ventilation without difficulty Laryngoscope Size: Glidescope and 3 Tube type: Oral Tube size: 7.0 mm Number of attempts: 1 Airway Equipment and Method: Video-laryngoscopy and Stylet Placement Confirmation: ETT inserted through vocal cords under direct vision, positive ETCO2 and breath sounds checked- equal and bilateral Secured at: 21 cm Tube secured with: Tape Dental Injury: Injury to tongue  Comments: Easy mask ventilation. Glidescope used d/t anticipated anterior airway. Partial view of cords with Glide. ETT advanced without difficulty. Very small laceration noted under tongue after intubation with small amount of bleeding. Packed with gauze, and will monitor.  +BBS. +EtCO2.

## 2020-08-14 NOTE — Progress Notes (Signed)
ANTICOAGULATION CONSULT NOTE - Initial Consult  Pharmacy Consult for heparin Indication:  Post neuro-IR intervention  Allergies  Allergen Reactions   Keflex [Cephalexin]     Patient Measurements:   Heparin Dosing Weight: 77 kg  Vital Signs: Temp: 99.1 F (37.3 C) (08/09 0800) Temp Source: Oral (08/09 0400) BP: 91/64 (08/09 1018) Pulse Rate: 62 (08/09 1000)  Labs: Recent Labs    08/12/20 1148 08/13/20 0700 08/14/20 0340  HGB 13.9 13.2 12.6  HCT 41.6 40.4 37.1  PLT 336 350 307  APTT 26  --   --   LABPROT 13.6  --  13.4  INR 1.0  --  1.0  CREATININE 0.62 0.84 0.63    Estimated Creatinine Clearance: 86 mL/min (by C-G formula based on SCr of 0.63 mg/dL).   Medical History: Past Medical History:  Diagnosis Date   ADHD    Anxiety    Carpal tunnel syndrome on right    Depression    HTN (hypertension)    Hypothyroidism    Migraines    Spondylosis     Medications:  Medications Prior to Admission  Medication Sig Dispense Refill Last Dose   acetaminophen (TYLENOL) 500 MG tablet Take 500 mg by mouth every 6 (six) hours as needed for mild pain, fever or headache.   Past Week   levothyroxine (SYNTHROID) 50 MCG tablet Take 50 mcg by mouth every morning.   08/12/2020   lisinopril-hydrochlorothiazide (ZESTORETIC) 20-12.5 MG tablet Take 1 tablet by mouth daily.   08/12/2020   PARoxetine (PAXIL) 10 MG tablet Take 10 mg by mouth daily.   08/12/2020    Assessment: 54 YOF with occluded Rt ICA s/p stent assisted angioplasty. Pharmacy consulted to start IV heparin per neuro-IR protocol.   H/H and Plt wnl. SCr wnl   Goal of Therapy:  Heparin level 0.1-0.25 units/ml Monitor platelets by anticoagulation protocol: Yes   Plan:  -Start heparin infusion at 650 units/hr. No bolus  -F/u 6 hr HL -Monitor daily HL, CBC and s/s of bleeding   Vinnie Level, PharmD., BCPS, BCCCP Clinical Pharmacist Please refer to [facility] for unit-specific pharmacist

## 2020-08-14 NOTE — Transfer of Care (Signed)
Immediate Anesthesia Transfer of Care Note  Patient: Vicki Gonzales  Procedure(s) Performed: IR WITH ANESTHESIA  Patient Location: PACU  Anesthesia Type:General  Level of Consciousness: awake, alert , oriented and patient cooperative  Airway & Oxygen Therapy: Patient Spontanous Breathing and Patient connected to face mask oxygen  Post-op Assessment: Report given to RN, Post -op Vital signs reviewed and stable and Patient moving all extremities  Post vital signs: Reviewed and stable  Last Vitals:  Vitals Value Taken Time  BP 117/33 08/14/20 1503  Temp    Pulse 74 08/14/20 1509  Resp 16 08/14/20 1509  SpO2 96 % 08/14/20 1509  Vitals shown include unvalidated device data.  Last Pain:  Vitals:   08/14/20 1018  TempSrc:   PainSc: 0-No pain         Complications: No notable events documented.

## 2020-08-14 NOTE — Procedures (Addendum)
S/P 4 vessel cerebral arteriogram. Rt CFA approach . S/P revascularization of occluded Rt ICA prox with stent assisted angioplasty with prox flow arrest. . RT MCA occluded ant perisylvian branches and distal Rt MCA inferior division  parietal M3 branch C/W infarcrts seen on most recent MRI. Post CT brain No ICH or mass effect. Rt CFA hemostasis with 33F exosel. Distal pulses all intact . Patient extubated. Denies any H/A or nausea.Follows simple instructions appropriately. . Pupils 33mm Rt = Lt sluggish. Lt facial droop. Tongue midline. Able to raise Lt UE against gravity. Able to bend Lt knee .  Vicki Zellner MD

## 2020-08-14 NOTE — Progress Notes (Signed)
Orthopedic Tech Progress Note Patient Details:  Vicki Gonzales 16-May-1965 [id number]  IR RN called requesting a SOFT COLLAR for patient  Ortho Devices Type of Ortho Device: Soft collar Ortho Device/Splint Location: NECK Ortho Device/Splint Interventions: Ordered   Post Interventions Patient Tolerated: Well Instructions Provided: Care of device  Donald Pore 08/14/2020, 2:48 PM

## 2020-08-14 NOTE — Anesthesia Procedure Notes (Signed)
Arterial Line Insertion Start/End8/09/2020 10:40 AM, 08/14/2020 10:45 AM Performed by: Lucinda Dell, CRNA, CRNA  Patient location: Pre-op. Preanesthetic checklist: patient identified, IV checked, site marked, risks and benefits discussed, surgical consent, monitors and equipment checked and pre-op evaluation Lidocaine 1% used for infiltration Left, radial was placed Catheter size: 20 G Hand hygiene performed  and maximum sterile barriers used  Allen's test indicative of satisfactory collateral circulation Attempts: 1 Procedure performed without using ultrasound guided technique. Following insertion, dressing applied and Biopatch. Post procedure assessment: normal  Patient tolerated the procedure well with no immediate complications.

## 2020-08-14 NOTE — Progress Notes (Signed)
PT Cancellation Note  Patient Details Name: Vicki Gonzales MRN: [id number] DOB: 10/01/1965   Cancelled Treatment:    Reason [name] Not Completed: Other (comment) Pt currently receiving a bath; plan for IR at 12. Will reassess. Lillia Pauls, PT, DPT Acute Rehabilitation Services Pager [phone] Office [phone]    Norval Morton 08/14/2020, 8:45 AM

## 2020-08-15 ENCOUNTER — Encounter (HOSPITAL_COMMUNITY): Payer: Self-pay | Admitting: Interventional Radiology

## 2020-08-15 ENCOUNTER — Inpatient Hospital Stay (HOSPITAL_COMMUNITY): Payer: BC Managed Care – PPO | Admitting: Anesthesiology

## 2020-08-15 ENCOUNTER — Inpatient Hospital Stay (HOSPITAL_COMMUNITY): Payer: BC Managed Care – PPO

## 2020-08-15 ENCOUNTER — Encounter (HOSPITAL_COMMUNITY)
Admission: EM | Disposition: A | Discharge status: Rehabilitation Facility | Payer: Self-pay | Source: Home / Self Care | Attending: Neurology

## 2020-08-15 DIAGNOSIS — I729 Aneurysm of unspecified site: Secondary | ICD-10-CM

## 2020-08-15 DIAGNOSIS — Z9889 Other specified postprocedural states: Secondary | ICD-10-CM

## 2020-08-15 DIAGNOSIS — T81718A Complication of other artery following a procedure, not elsewhere classified, initial encounter: Secondary | ICD-10-CM

## 2020-08-15 DIAGNOSIS — I639 Cerebral infarction, unspecified: Secondary | ICD-10-CM

## 2020-08-15 DIAGNOSIS — Y838 Other surgical procedures as the cause of abnormal reaction of the patient, or of later complication, without mention of misadventure at the time of the procedure: Secondary | ICD-10-CM

## 2020-08-15 DIAGNOSIS — I724 Aneurysm of artery of lower extremity: Secondary | ICD-10-CM

## 2020-08-15 DIAGNOSIS — T82838A Hemorrhage of vascular prosthetic devices, implants and grafts, initial encounter: Secondary | ICD-10-CM

## 2020-08-15 HISTORY — PX: FALSE ANEURYSM REPAIR: SHX5152

## 2020-08-15 LAB — CBC WITH DIFFERENTIAL/PLATELET
Abs Immature Granulocytes: 0.05 10*3/uL (ref 0.00–0.07)
Basophils Absolute: 0.1 10*3/uL (ref 0.0–0.1)
Basophils Relative: 1 %
Eosinophils Absolute: 0.3 10*3/uL (ref 0.0–0.5)
Eosinophils Relative: 2 %
HCT: 32.8 % — ABNORMAL LOW (ref 36.0–46.0)
Hemoglobin: 11 g/dL — ABNORMAL LOW (ref 12.0–15.0)
Immature Granulocytes: 0 %
Lymphocytes Relative: 15 %
Lymphs Abs: 1.9 10*3/uL (ref 0.7–4.0)
MCH: 29.3 pg (ref 26.0–34.0)
MCHC: 33.5 g/dL (ref 30.0–36.0)
MCV: 87.2 fL (ref 80.0–100.0)
Monocytes Absolute: 0.9 10*3/uL (ref 0.1–1.0)
Monocytes Relative: 7 %
Neutro Abs: 9.4 10*3/uL — ABNORMAL HIGH (ref 1.7–7.7)
Neutrophils Relative %: 75 %
Platelets: 277 10*3/uL (ref 150–400)
RBC: 3.76 MIL/uL — ABNORMAL LOW (ref 3.87–5.11)
RDW: 14.5 % (ref 11.5–15.5)
WBC: 12.6 10*3/uL — ABNORMAL HIGH (ref 4.0–10.5)
nRBC: 0 % (ref 0.0–0.2)

## 2020-08-15 LAB — BASIC METABOLIC PANEL
Anion gap: 10 (ref 5–15)
BUN: 6 mg/dL (ref 6–20)
CO2: 21 mmol/L — ABNORMAL LOW (ref 22–32)
Calcium: 8.3 mg/dL — ABNORMAL LOW (ref 8.9–10.3)
Chloride: 109 mmol/L (ref 98–111)
Creatinine, Ser: 0.53 mg/dL (ref 0.44–1.00)
GFR, Estimated: >60 mL/min (ref >60)
Glucose, Bld: 97 mg/dL (ref 70–99)
Potassium: 2.8 mmol/L — ABNORMAL LOW (ref 3.5–5.1)
Sodium: 140 mmol/L (ref 135–145)

## 2020-08-15 SURGERY — REPAIR, PSEUDOANEURYSM
Anesthesia: General | Site: Groin | Laterality: Right

## 2020-08-15 MED ORDER — ONDANSETRON HCL 4 MG/2ML IJ SOLN
INTRAMUSCULAR | Status: AC
  Filled 2020-08-15: qty 2

## 2020-08-15 MED ORDER — LIDOCAINE 2% (20 MG/ML) 5 ML SYRINGE
INTRAMUSCULAR | Status: AC
  Filled 2020-08-15: qty 5

## 2020-08-15 MED ORDER — PHENYLEPHRINE HCL-NACL 20-0.9 MG/250ML-% IV SOLN
INTRAVENOUS | Status: DC | PRN
  Administered 2020-08-15: 25 ug/min via INTRAVENOUS

## 2020-08-15 MED ORDER — ORAL CARE MOUTH RINSE: 15.0000 mL | Freq: Once | OROMUCOSAL | Status: AC

## 2020-08-15 MED ORDER — MORPHINE SULFATE (PF) 2 MG/ML IV SOLN: 2.0000 mg | INTRAVENOUS | Status: DC | PRN

## 2020-08-15 MED ORDER — ONDANSETRON HCL 4 MG/2ML IJ SOLN
INTRAMUSCULAR | Status: DC | PRN
  Administered 2020-08-15: 4 mg via INTRAVENOUS

## 2020-08-15 MED ORDER — HEPARIN 6000 UNIT IRRIGATION SOLUTION
Status: AC
  Filled 2020-08-15: qty 500

## 2020-08-15 MED ORDER — CEFAZOLIN SODIUM-DEXTROSE 2-4 GM/100ML-% IV SOLN
INTRAVENOUS | Status: AC
  Filled 2020-08-15: qty 100

## 2020-08-15 MED ORDER — SUGAMMADEX SODIUM 200 MG/2ML IV SOLN
INTRAVENOUS | Status: DC | PRN
  Administered 2020-08-15: 200 mg via INTRAVENOUS

## 2020-08-15 MED ORDER — CHLORHEXIDINE GLUCONATE 0.12 % MT SOLN
OROMUCOSAL | Status: AC
  Administered 2020-08-15: 15 mL via OROMUCOSAL
  Filled 2020-08-15: qty 15

## 2020-08-15 MED ORDER — DEXAMETHASONE SODIUM PHOSPHATE 10 MG/ML IJ SOLN
INTRAMUSCULAR | Status: AC
  Filled 2020-08-15: qty 1

## 2020-08-15 MED ORDER — FENTANYL CITRATE (PF) 250 MCG/5ML IJ SOLN
INTRAMUSCULAR | Status: AC
  Filled 2020-08-15: qty 5

## 2020-08-15 MED ORDER — HEMOSTATIC AGENTS (NO CHARGE) OPTIME
TOPICAL | Status: DC | PRN
  Administered 2020-08-15: 1 via TOPICAL

## 2020-08-15 MED ORDER — LIDOCAINE 2% (20 MG/ML) 5 ML SYRINGE
INTRAMUSCULAR | Status: DC | PRN
  Administered 2020-08-15: 60 mg via INTRAVENOUS

## 2020-08-15 MED ORDER — FENTANYL CITRATE (PF) 100 MCG/2ML IJ SOLN
25.0000 ug | INTRAMUSCULAR | Status: DC | PRN
  Administered 2020-08-15: 25 ug via INTRAVENOUS

## 2020-08-15 MED ORDER — FENTANYL CITRATE (PF) 250 MCG/5ML IJ SOLN
INTRAMUSCULAR | Status: DC | PRN
  Administered 2020-08-15 (×3): 25 ug via INTRAVENOUS
  Administered 2020-08-15: 50 ug via INTRAVENOUS
  Administered 2020-08-15: 25 ug via INTRAVENOUS

## 2020-08-15 MED ORDER — MIDAZOLAM HCL 2 MG/2ML IJ SOLN
INTRAMUSCULAR | Status: DC | PRN
  Administered 2020-08-15: 1 mg via INTRAVENOUS

## 2020-08-15 MED ORDER — CHLORHEXIDINE GLUCONATE 0.12 % MT SOLN: 15.0000 mL | Freq: Once | OROMUCOSAL | Status: AC

## 2020-08-15 MED ORDER — HEPARIN 6000 UNIT IRRIGATION SOLUTION
Status: DC | PRN
  Administered 2020-08-15: 1

## 2020-08-15 MED ORDER — ONDANSETRON HCL 4 MG/2ML IJ SOLN: 4.0000 mg | Freq: Once | INTRAMUSCULAR | Status: DC | PRN

## 2020-08-15 MED ORDER — DEXAMETHASONE SODIUM PHOSPHATE 10 MG/ML IJ SOLN
INTRAMUSCULAR | Status: DC | PRN
  Administered 2020-08-15: 10 mg via INTRAVENOUS

## 2020-08-15 MED ORDER — FENTANYL CITRATE (PF) 100 MCG/2ML IJ SOLN
INTRAMUSCULAR | Status: AC
  Filled 2020-08-15: qty 2

## 2020-08-15 MED ORDER — LACTATED RINGERS IV SOLN: INTRAVENOUS | Status: DC

## 2020-08-15 MED ORDER — PROPOFOL 10 MG/ML IV BOLUS
INTRAVENOUS | Status: DC | PRN
  Administered 2020-08-15: 100 mg via INTRAVENOUS
  Administered 2020-08-15: 50 mg via INTRAVENOUS

## 2020-08-15 MED ORDER — CEFAZOLIN SODIUM-DEXTROSE 2-3 GM-%(50ML) IV SOLR
INTRAVENOUS | Status: DC | PRN
  Administered 2020-08-15: 2 g via INTRAVENOUS

## 2020-08-15 MED ORDER — MIDAZOLAM HCL 2 MG/2ML IJ SOLN
INTRAMUSCULAR | Status: AC
  Filled 2020-08-15: qty 2

## 2020-08-15 MED ORDER — ROCURONIUM BROMIDE 10 MG/ML (PF) SYRINGE
PREFILLED_SYRINGE | INTRAVENOUS | Status: DC | PRN
  Administered 2020-08-15: 50 mg via INTRAVENOUS

## 2020-08-15 MED ORDER — EPHEDRINE SULFATE 50 MG/ML IJ SOLN
INTRAMUSCULAR | Status: DC | PRN
  Administered 2020-08-15: 10 mg via INTRAVENOUS

## 2020-08-15 MED ORDER — 0.9 % SODIUM CHLORIDE (POUR BTL) OPTIME
TOPICAL | Status: DC | PRN
  Administered 2020-08-15: 2000 mL

## 2020-08-15 SURGICAL SUPPLY — 42 items
ADH SKN CLS APL DERMABOND .7 (GAUZE/BANDAGES/DRESSINGS) ×1
AGENT HMST KT MTR STRL THRMB (HEMOSTASIS) ×1
BAG COUNTER SPONGE SURGICOUNT (BAG) ×2 IMPLANT
BAG DECANTER FOR FLEXI CONT (MISCELLANEOUS) ×2 IMPLANT
BAG SPNG CNTER NS LX DISP (BAG) ×1
BAG SURGICOUNT SPONGE COUNTING (BAG) ×1
CANISTER SUCT 3000ML PPV (MISCELLANEOUS) ×3 IMPLANT
CLIP VESOCCLUDE MED 24/CT (CLIP) ×3 IMPLANT
CLIP VESOCCLUDE SM WIDE 24/CT (CLIP) ×3 IMPLANT
COVER PROBE W GEL 5X96 (DRAPES) ×2 IMPLANT
DERMABOND ADVANCED (GAUZE/BANDAGES/DRESSINGS) ×2
DERMABOND ADVANCED .7 DNX12 (GAUZE/BANDAGES/DRESSINGS) ×1 IMPLANT
DRAIN CHANNEL 15F RND FF W/TCR (WOUND CARE) IMPLANT
ELECT REM PT RETURN 9FT ADLT (ELECTROSURGICAL) ×3
ELECTRODE REM PT RTRN 9FT ADLT (ELECTROSURGICAL) ×1 IMPLANT
EVACUATOR SILICONE 100CC (DRAIN) IMPLANT
GAUZE 4X4 16PLY ~~LOC~~+RFID DBL (SPONGE) ×2 IMPLANT
GLOVE SURG POLYISO LF SZ7.5 (GLOVE) ×3 IMPLANT
GLOVE SURG UNDER POLY LF SZ6.5 (GLOVE) ×4 IMPLANT
GLOVE SURG UNDER POLY LF SZ7.5 (GLOVE) ×3 IMPLANT
GOWN STRL REUS W/ TWL LRG LVL3 (GOWN DISPOSABLE) ×2 IMPLANT
GOWN STRL REUS W/ TWL XL LVL3 (GOWN DISPOSABLE) ×1 IMPLANT
GOWN STRL REUS W/TWL LRG LVL3 (GOWN DISPOSABLE) ×3
GOWN STRL REUS W/TWL XL LVL3 (GOWN DISPOSABLE) ×9
HEMOSTAT SNOW SURGICEL 2X4 (HEMOSTASIS) IMPLANT
KIT BASIN OR (CUSTOM PROCEDURE TRAY) ×3 IMPLANT
KIT TURNOVER KIT B (KITS) ×3 IMPLANT
NS IRRIG 1000ML POUR BTL (IV SOLUTION) ×6 IMPLANT
PACK PERIPHERAL VASCULAR (CUSTOM PROCEDURE TRAY) ×3 IMPLANT
PAD ARMBOARD 7.5X6 YLW CONV (MISCELLANEOUS) ×6 IMPLANT
SPONGE T-LAP 18X18 ~~LOC~~+RFID (SPONGE) ×4 IMPLANT
SURGIFLO W/THROMBIN 8M KIT (HEMOSTASIS) ×2 IMPLANT
SUT PROLENE 5 0 C 1 24 (SUTURE) ×6 IMPLANT
SUT PROLENE 6 0 BV (SUTURE) ×3 IMPLANT
SUT VIC AB 2-0 CT1 27 (SUTURE) ×3
SUT VIC AB 2-0 CT1 TAPERPNT 27 (SUTURE) ×2 IMPLANT
SUT VIC AB 3-0 SH 27 (SUTURE) ×3
SUT VIC AB 3-0 SH 27X BRD (SUTURE) ×2 IMPLANT
SUT VICRYL 4-0 PS2 18IN ABS (SUTURE) ×2 IMPLANT
TOWEL GREEN STERILE (TOWEL DISPOSABLE) ×3 IMPLANT
TRAY FOLEY MTR SLVR 16FR STAT (SET/KITS/TRAYS/PACK) ×3 IMPLANT
WATER STERILE IRR 1000ML POUR (IV SOLUTION) ×3 IMPLANT

## 2020-08-15 NOTE — Progress Notes (Signed)
Limited right lower extremity arterial duplex (pseudoaneurysm evaluation), and carotid artery duplex has been completed. Refer to "CV Proc" under chart review to view preliminary results.  Preliminary results discussed with TK, RN.  08/15/2020 10:04 AM Eula Fried., MHA, RVT, RDCS, RDMS

## 2020-08-15 NOTE — Consult Note (Addendum)
Hospital Consult    Reason for Consult:  right CFA psa Requesting Physician:  Corliss Skains MRN #:  [id number]  History of Present Illness: This is a 55 y.o. female who presented to the hospital 3 days ago with left sided facial droop & slurred speech after a fall at home where she hit the back of her head.  She underwent 4 vessel cerebral arteriogram with revascularization of occluded right ICA proximally with stent assisted angioplasty with proximal flow arrest by Dr. Corliss Skains yesterday.  On exam this morning, she was noted to have hematoma in the right groin and u/s revealed a right CFA psa.  Vascular surgery is consulted.   Pt having some pain in the right groin.  She denies any pain in her right foot.  The pt is on a statin for cholesterol management.  The pt is on a daily aspirin.   Other AC:  Brilinta The pt is on ACEI for hypertension.   The pt is not diabetic.   Tobacco hx:  former  Past Medical History:  Diagnosis Date   ADHD    Anxiety    Carpal tunnel syndrome on right    Depression    HTN (hypertension)    Hypothyroidism    Migraines    Spondylosis     Past Surgical History:  Procedure Laterality Date   ANTERIOR CRUCIATE LIGAMENT REPAIR Left 2021   APPENDECTOMY     RADIOLOGY WITH ANESTHESIA N/A 08/14/2020   Procedure: IR WITH ANESTHESIA;  Surgeon: Julieanne Cotton, MD;  Location: [facility] OR;  Service: Radiology;  Laterality: N/A;   TUBAL LIGATION      Allergies  Allergen Reactions   Keflex [Cephalexin]     Prior to Admission medications   Medication Sig Start Date End Date Taking? Authorizing Provider  acetaminophen (TYLENOL) 500 MG tablet Take 500 mg by mouth every 6 (six) hours as needed for mild pain, fever or headache.   Yes [provider]  levothyroxine (SYNTHROID) 50 MCG tablet Take 50 mcg by mouth every morning. 05/10/20  Yes [provider]  lisinopril-hydrochlorothiazide (ZESTORETIC) 20-12.5 MG tablet Take 1 tablet by mouth daily. 06/18/20   Yes [provider]  PARoxetine (PAXIL) 10 MG tablet Take 10 mg by mouth daily. 05/04/20  Yes [provider]    Social History   Socioeconomic History   Marital status: Married    Spouse name: Not on file   Number of children: Not on file   Years of education: Not on file   Highest education level: Not on file  Occupational History   Not on file  Tobacco Use   Smoking status: Former   Smokeless tobacco: Never  Substance and Sexual Activity   Alcohol use: Not on file   Drug use: Not on file   Sexual activity: Not on file  Other Topics Concern   Not on file  Social History Narrative   Not on file   Social Determinants of Health   Financial Resource Strain: Not on file  Food Insecurity: Not on file  Transportation Needs: Not on file  Physical Activity: Not on file  Stress: Not on file  Social Connections: Not on file  Intimate Partner Violence: Not on file    Family Hx:  no family hx of AAA  ROS: [x]  Positive   [ ]  Negative   [ ]  All sytems reviewed and are negative  Cardiac: []  chest pain/pressure []  palpitations []  SOB lying flat []  DOE  Vascular: []  pain  in legs while walking []  pain in legs at rest []  pain in legs at night []  non-healing ulcers []  hx of DVT []  swelling in legs  Pulmonary: []  asthma/wheezing []  home O2  Neurologic: []  facial droop   Hematologic: []  hx of cancer  Endocrine:   []  diabetes [x]  thyroid disease  GI []  GERD  GU: []  CKD/renal failure []  HD--[]  M/W/F or []  T/T/S  Psychiatric: []  anxiety []  depression  Musculoskeletal: []  arthritis []  joint pain  Integumentary: []  rashes []  ulcers  Constitutional: []  fever  []  chills  Physical Examination  Vitals:   08/15/20 1000 08/15/20 1030  BP:  101/60  Pulse: (!) 54 (!) 59  Resp: (!) 24 13  Temp:    SpO2: 99% 98%    General:  WDWN in NAD Gait: Not observed HENT: WNL, normocephalic Pulmonary: normal non-labored breathing Cardiac:  regular Abdomen:  soft, NT/ND; aortic pulse is not palpable Skin: without rashes Vascular Exam/Pulses: 2+ right radial pulse; unable to palpate left arm due to bandage; 2+DP pulses bilaterally; right femoral artery is 2+ Extremities: without ischemic changes, without Gangrene , without cellulitis; without open wounds; brace on right leg to keep pt from bending her leg.  Some ecchymosis right groin.  Musculoskeletal: no muscle wasting or atrophy  Neurologic: A&O X 3; speech is fluent/normal Psychiatric:  The pt has Normal affect.   CBC    Component Value Date/Time   WBC 12.6 (H) 08/15/2020 0536   RBC 3.76 (L) 08/15/2020 0536   HGB 11.0 (L) 08/15/2020 0536   HCT 32.8 (L) 08/15/2020 0536   PLT 277 08/15/2020 0536   MCV 87.2 08/15/2020 0536   MCH 29.3 08/15/2020 0536   MCHC 33.5 08/15/2020 0536   RDW 14.5 08/15/2020 0536   LYMPHSABS 1.9 08/15/2020 0536   MONOABS 0.9 08/15/2020 0536   EOSABS 0.3 08/15/2020 0536   BASOSABS 0.1 08/15/2020 0536    BMET    Component Value Date/Time   NA 140 08/15/2020 0536   K 2.8 (L) 08/15/2020 0536   CL 109 08/15/2020 0536   CO2 21 (L) 08/15/2020 0536   GLUCOSE 97 08/15/2020 0536   BUN 6 08/15/2020 0536   CREATININE 0.53 08/15/2020 0536   CALCIUM 8.3 (L) 08/15/2020 0536   GFRNONAA >60 08/15/2020 0536    COAGS: Lab Results  Component Value Date   INR 1.0 08/14/2020   INR 1.0 08/12/2020     Non-Invasive Vascular Imaging:   Femoral psa u/s 08/15/2020 Findings:  An area with well defined borders measuring 2.3 cm x 2.0 cm was visualized arising off of the right CFA with ultrasound characteristics of a pseudoaneurysm. Mixed echos within the structure suggest that it is partially thrombosed with a residual diameter  of 1.5 cm x 1.3 cm. The neck measures approximately 0.4 cm wide and 0.3 cm long.    ASSESSMENT/PLAN: This is a 55 y.o. female 4 vessel cerebral arteriogram with revascularization of occluded right ICA proximally with stent  assisted angioplasty with proximal flow arrest now with right CFA pseudoaneurysm.  -pt with 1.5 x 1.3 partially thrombosed psa right CFA and the neck measures 0.4cm wide and 0.3cm long.   -pt for repair in OR this afternoon   10/15/2020, PA-C [facility] [phone]   I agree with the above.  Have seen and evaluated patient.  She is postop day 1: Neuro interventional procedure.  She began complaining of groin pain on the right.  Ultrasound revealed a 2.3 x 2  cm pseudoaneurysm that does not have a long neck.  It is also a right bifurcation.  I discussed with the patient and her husband that she is not a good candidate for ultrasound-guided compression or thrombin injection.  I think the best option is to proceed with surgical repair.  They are in agreement.  We discussed the risks with the procedure including risk of bleeding and wound complications.  All questions were answered.  Durene Cal

## 2020-08-15 NOTE — Transfer of Care (Signed)
Immediate Anesthesia Transfer of Care Note  Patient: Vicki Gonzales  Procedure(s) Performed: REPAIR RIGHT FEMORAL PSEUDOANEURYSM (Right: Groin)  Patient Location: PACU  Anesthesia Type:General  Level of Consciousness: awake, alert  and oriented  Airway & Oxygen Therapy: Patient Spontanous Breathing  Post-op Assessment: Report given to RN and Post -op Vital signs reviewed and stable  Post vital signs: Reviewed and stable  Last Vitals:  Vitals Value Taken Time  BP 132/71 08/15/20 1844  Temp    Pulse 64 08/15/20 1846  Resp 19 08/15/20 1846  SpO2 93 % 08/15/20 1846  Vitals shown include unvalidated device data.  Last Pain:  Vitals:   08/15/20 1710  TempSrc: Oral  PainSc: 0-No pain         Complications: No notable events documented.

## 2020-08-15 NOTE — Anesthesia Procedure Notes (Signed)
Procedure Name: Intubation Date/Time: 08/15/2020 5:31 PM Performed by: Cecile Hearing, MD Pre-anesthesia Checklist: Patient identified, Emergency Drugs available, Suction available and Patient being monitored Patient Re-evaluated:Patient Re-evaluated prior to induction Oxygen Delivery Method: Circle system utilized Preoxygenation: Pre-oxygenation with 100% oxygen Induction Type: IV induction Ventilation: Mask ventilation without difficulty Laryngoscope Size: Glidescope and 3 Grade View: Grade I Tube type: Oral Tube size: 7.0 mm Number of attempts: 1 Airway Equipment and Method: Stylet and Oral airway Placement Confirmation: ETT inserted through vocal cords under direct vision, positive ETCO2 and breath sounds checked- equal and bilateral Secured at: 21 cm Tube secured with: Tape Dental Injury: Teeth and Oropharynx as per pre-operative assessment

## 2020-08-15 NOTE — Progress Notes (Signed)
PT Cancellation Note  Patient Details Name: Vicki Gonzales MRN: [id number] DOB: 03-Mar-1965   Cancelled Treatment:    Reason Eval/Treat Not Completed: Active bedrest order Patient on bedrest due to R groin pseudoaneurysm. PT will re-attempt at later date.    Honor Frison A. Dan Humphreys PT, DPT Acute Rehabilitation Services Pager [phone] Office [phone]    Viviann Spare 08/15/2020, 1:55 PM

## 2020-08-15 NOTE — Progress Notes (Signed)
STROKE TEAM PROGRESS NOTE   INTERVAL HISTORY Her husband and RN are at the bedside.  Patient drowsy sleepy but easily arousable. Still has left UE pronator drift and left sided sensory loss. She had right ICA revascularization yesterday and did well with TICI3. However, developed right groin hematoma, found to have small pseudoaneurysm. Vascular surgery consulted.   Vitals:   08/15/20 0900 08/15/20 1000 08/15/20 1030 08/15/20 1200  BP:   101/60   Pulse: 63 (!) 54 (!) 59   Resp: (!) 21 (!) 24 13   Temp:    99.2 F (37.3 C)  TempSrc:    Oral  SpO2: 93% 99% 98%    CBC:  Recent Labs  Lab 08/14/20 0340 08/15/20 0536  WBC 10.4 12.6*  NEUTROABS 7.6 9.4*  HGB 12.6 11.0*  HCT 37.1 32.8*  MCV 85.7 87.2  PLT 307 277   Basic Metabolic Panel:  Recent Labs  Lab 08/14/20 0340 08/15/20 0536  NA 141 140  K 3.3* 2.8*  CL 110 109  CO2 22 21*  GLUCOSE 125* 97  BUN 8 6  CREATININE 0.63 0.53  CALCIUM 8.9 8.3*   Lipid Panel:  Recent Labs  Lab 08/12/20 1628  CHOL 253*  TRIG 217*  HDL 38*  CHOLHDL 6.7  VLDL 43*  LDLCALC 172*   HgbA1c:  Recent Labs  Lab 08/12/20 1628  HGBA1C 6.2*   Urine Drug Screen:  Recent Labs  Lab 08/12/20 1300  LABOPIA NONE DETECTED  COCAINSCRNUR NONE DETECTED  LABBENZ NONE DETECTED  AMPHETMU NONE DETECTED  THCU NONE DETECTED  LABBARB NONE DETECTED    Alcohol Level  Recent Labs  Lab 08/12/20 1148  ETH <10    IMAGING past 24 hours VAS [place] GROIN PSEUDOANEURYSM  Result Date: 08/15/2020  ARTERIAL PSEUDOANEURYSM  Patient Name:  DELYLA SANDEEN  Date of Exam:   08/15/2020 Medical Rec #: [id number]                Accession #:    [id number] Date of Birth: [birth year]                Patient Gender: F Patient Age:   55 years Exam Location:  [facility] Procedure:      VAS [place] Bobetta Lime Referring Phys: Alwyn Ren --------------------------------------------------------------------------------  Exam: Right groin Indications:  Patient complains of groin pain. History: S/p catheterization. Comparison Study: No prior study Performing Technologist: Gertie Fey MHA, RDMS, RVT, RDCS  Examination Guidelines: A complete evaluation includes B-mode imaging, spectral Doppler, color Doppler, and power Doppler as needed of all accessible portions of each vessel. Bilateral testing is considered an integral part of a complete examination. Limited examinations for reoccurring indications may be performed as noted. +------------+----------+---------+------+----------+ Right DuplexPSV (cm/s)Waveform PlaqueComment(s) +------------+----------+---------+------+----------+ CFA            134    triphasic                 +------------+----------+---------+------+----------+ PFA             74    biphasic                  +------------+----------+---------+------+----------+ Prox SFA       111    triphasic                 +------------+----------+---------+------+----------+  Findings: An area with well defined borders measuring 2.3 cm x 2.0 cm was visualized arising off of the right CFA with ultrasound characteristics  of a pseudoaneurysm. Mixed echos within the structure suggest that it is partially thrombosed with a residual diameter  of 1.5 cm x 1.3 cm. The neck measures approximately 0.4 cm wide and 0.3 cm long.  Summary: Ultrasound characteristics of a pseudoaneurysm is visualized arising from the right CFA.    --------------------------------------------------------------------------------    Preliminary    VAS US CAROTID  Result Date: 08/15/2020 Carotid Arterial Duplex Study Patient Name:  Lyla GlassingNNABELLE F VAN ALSTYNE  Date of Exam:   08/15/2020 Medical Rec #: [id number]                Accession #:    [id number] Date of Birth: 03/04/1965                Patient Gender: F Patient Age:   55 years [facility] Procedure:      VAS US CAROTID Referring Phys: Alwyn RenJAMIE COVINGTON  --------------------------------------------------------------------------------  Indications:       History of right ICA occlusion with recent stent angioplasty Comparison Study:  CTA neck 08/12/20- right ICA occlusion, left vertebral artery                    occlusion. Performing Technologist: Gertie FeyMichelle Simonetti MHA, RDMS, RVT, RDCS  Examination Guidelines: A complete evaluation includes B-mode imaging, spectral Doppler, color Doppler, and power Doppler as needed of all accessible portions of each vessel. Bilateral testing is considered an integral part of a complete examination. Limited examinations for reoccurring indications may be performed as noted.  Right Carotid Findings: +----------+--------+--------+--------+------------------+------------------+           PSV cm/sEDV cm/sStenosisPlaque DescriptionComments           +----------+--------+--------+--------+------------------+------------------+ CCA Prox  92      15                                                   +----------+--------+--------+--------+------------------+------------------+ CCA Distal69      15                                intimal thickening +----------+--------+--------+--------+------------------+------------------+ ICA Distal67      30                                                   +----------+--------+--------+--------+------------------+------------------+ ECA       152     13                                                   +----------+--------+--------+--------+------------------+------------------+ +----------+--------+-------+----------------+-------------------+           PSV cm/sEDV cmsDescribe        Arm Pressure (mmHG) +----------+--------+-------+----------------+-------------------+ [id number]            Multiphasic, WNL                    +----------+--------+-------+----------------+-------------------+ +---------+--------+--+--------+--+---------+ VertebralPSV  cm/s89EDV cm/s32Antegrade +---------+--------+--+--------+--+---------+  Right Stent(s): +---------------+--+--++++ Prox to Stent  [id number] +---------------+--+--++++ Proximal Stent 5519 +---------------+--+--++++ Mid Stent  1478 +---------------+--+--++++ Distal Stent   9940 +---------------+--+--++++ Distal to [id number] +---------------+--+--++++   Left Carotid Findings: +----------+--------+--------+--------+------------------+------------------+           PSV cm/sEDV cm/sStenosisPlaque DescriptionComments           +----------+--------+--------+--------+------------------+------------------+ CCA Prox  111     27                                                   +----------+--------+--------+--------+------------------+------------------+ CCA Distal88      26                                                   +----------+--------+--------+--------+------------------+------------------+ ICA Prox  82      28                                intimal thickening +----------+--------+--------+--------+------------------+------------------+ ICA Distal95      42                                                   +----------+--------+--------+--------+------------------+------------------+ ECA       86      13                                                   +----------+--------+--------+--------+------------------+------------------+ +----------+--------+--------+----------------+-------------------+           PSV cm/sEDV cm/sDescribe        Arm Pressure (mmHG) +----------+--------+--------+----------------+-------------------+ [id number]             Multiphasic, WNL                    +----------+--------+--------+----------------+-------------------+ +---------+--------+--------+------+ VertebralPSV cm/sEDV cm/sAbsent +---------+--------+--------+------+   Summary: Right Carotid: The right ICA stent appears <50% stenosed.  Left Carotid: The extracranial vessels were near-normal with only minimal wall               thickening or plaque. Vertebrals:  Right vertebral artery demonstrates antegrade flow. Left vertebral              artery demonstrates an occlusion, consistent with CTA neck. Subclavians: Normal flow hemodynamics were seen in bilateral subclavian              arteries. *See table(s) above for measurements and observations.     Preliminary     PHYSICAL EXAM  Temp:  [97.2 F (36.2 C)-99.2 F (37.3 C)] 99.2 F (37.3 C) (08/10 1200) Pulse Rate:  [54-76] 59 (08/10 1030) Resp:  [13-25] 13 (08/10 1030) BP: (95-126)/(33-76) 101/60 (08/10 1030) SpO2:  [89 %-99 %] 98 % (08/10 1030) Arterial Line BP: (102-140)/(58-74) 122/59 (08/10 0800)  General - Well nourished, well developed, in no apparent distress.  Ophthalmologic - fundi not visualized due to noncooperation.  Cardiovascular - Regular rhythm and rate.  Mental Status -  Sleepy but easily arousable. Orientatation to time, place, and person  were intact. Language including expression, naming, repetition, comprehension was assessed and found intact.  Cranial Nerves II - XII - II - Visual field intact OU. III, IV, VI - Extraocular movements intact. V - Facial sensation intact bilaterally. VII - mild left nasolabial fold flattening. VIII - Hearing & vestibular intact bilaterally. X - Palate elevates symmetrically. XI - Chin turning & shoulder shrug intact bilaterally. XII - Tongue protrusion intact.  Motor Strength - The patient's strength was normal in all extremities except left upper extremity pronator drift.  Bulk was normal and fasciculations were absent.   Motor Tone - Muscle tone was assessed at the neck and appendages and was normal.  Reflexes - The patient's reflexes were symmetrical in all extremities and she had no pathological reflexes.  Sensory - Light touch, temperature/pinprick were assessed and showed left upper and lower extremity  sensory loss.  Coordination - The patient had normal movements in the right hand with no ataxia or dysmetria.  However, left FTN ataxic not out of proportion to the weakness. Tremor was absent.  Gait and Station - deferred.   ASSESSMENT/PLAN Ms. Shynice Sigel is a 55 y.o. female with history of HTN, migraine presenting with left facial droop and slurry speech.   Stroke:  right MCA infarct due to right ICA occlusion s/p tPA complicated with small SAH and later right ICA stenting, etiology unclear CT head code stroke Acute infarct in the right insula and frontal operculum, ASPECTS is 8. CTA head and neck - right ICA occlusion and right M1/M2 non-occlusive clot vs. High grade stenosis Serial CT Head stable small SAH right frontal. MRI SWI only - Positive for small volume subarachnoid hemorrhage. MRI: repeat: right MCA multifocal moderate sized infarcts, stable trace SAH right frontal.  MRA  occlusion of right ICA reconstitution at ophthalmic segment. Occlusion of right M3.  Left VA occlusion, age indeterminate 8/9 s/p right ICA revascularization and stent 8/10 CUS right ICA stent patent 2D Echo EF 60-65% Plan for TEE Friday  May consider 30 day cardiac event monitoring as outpt LDL 172 HgbA1c 6.2 Hypercoagulable labs pending VTE prophylaxis - heparin subq No antithrombotic prior to admission, now on ASA 81 and Brilinta in after right ICA revascularization and stenting Therapy recommendations:  CIR Disposition:  pending  Trace right frontal SAH post tPA Fibrinogen level 248 Serial CT stable SAH No reversal needed MRI showed trace SAH right frontal  Right ICA occlusion Discussing with Dr. Corliss Skains  S/p revascularization and stenting of RICA 8/9 On aspirin and brilinta   Right groin pseudoaneurysm Post op right groin hematoma Doppler showed small 2.3 x 2 pseudoaneurysm Vascular surgery consulted Bedrest with right leg straight   Orthostatic hypotension Patient new  or worsening after sitting up at bedside commode, BP down to 80s, put back in bed, symptoms improving 1 L normal saline bolus stat 8/8 Albumin every 6 hours for 4 doses 8/8 Avoid low BP Stable now  Hypertension Home meds:  zestoretic Stable BP goal 120-160 s/p right ICA revasc Long-term BP goal normotensive  Hyperlipidemia Home meds:  none LDL 172, goal < 70 Add atorvastatin 80 mg daily  Continue statin at discharge  Other Stroke Risk Factors Former cigarette smoker, advised to stay a non-smoker ETOH use, alcohol level <10, advised to drink no more than 1 drink a day Migraines Possible OSA   Hospital day # 3  This patient is critically ill due to right MCA stroke, s/p IR with stenting, pseudoaneurysm, orthostatic hypotension and  at significant risk of neurological worsening, death form right groin hematoma, stent occlusion, recurrent stroke, syncope, seizure. This patient's care requires constant monitoring of vital signs, hemodynamics, respiratory and cardiac monitoring, review of multiple databases, neurological assessment, discussion with family, other specialists and medical decision making of high complexity. I spent 40 minutes of neurocritical care time in the care of this patient. I had long discussion with husband at bedside, updated pt current condition, treatment plan and potential prognosis, and answered all the questions. He expressed understanding and appreciation.   Marvel Plan, MD PhD Stroke Neurology 08/15/2020 12:12 PM  To contact Stroke Continuity provider, please refer to [email or web address]. After hours, contact General Neurology

## 2020-08-15 NOTE — Anesthesia Postprocedure Evaluation (Signed)
Anesthesia Post Note  Patient: Vicki Gonzales  Procedure(s) Performed: IR WITH ANESTHESIA     Patient location during evaluation: PACU Anesthesia Type: General Level of consciousness: sedated and patient cooperative Pain management: pain level controlled Vital Signs Assessment: post-procedure vital signs reviewed and stable Respiratory status: spontaneous breathing Cardiovascular status: stable Anesthetic complications: no   No notable events documented.  Last Vitals:  Vitals:   08/15/20 0700 08/15/20 0800  BP: (!) 101/54 107/68  Pulse: 64 60  Resp: 20 19  Temp:    SpO2: 95% 97%    Last Pain:  Vitals:   08/15/20 0400  TempSrc: Oral  PainSc: 2                  Lewie Loron

## 2020-08-15 NOTE — Anesthesia Preprocedure Evaluation (Addendum)
Anesthesia Evaluation  Patient identified by MRN, date of birth, ID band Patient awake    Reviewed: Allergy & Precautions, NPO status , Patient's Chart, lab work & pertinent test results  Airway Mallampati: II  TM Distance: >3 FB Neck ROM: Full    Dental  (+) Teeth Intact, Dental Advisory Given, Caps   Pulmonary former smoker,    Pulmonary exam normal breath sounds clear to auscultation       Cardiovascular hypertension, Pt. on medications + Peripheral Vascular Disease  Normal cardiovascular exam Rhythm:Regular Rate:Normal     Neuro/Psych  Headaches, PSYCHIATRIC DISORDERS Anxiety Depression CVA, Residual Symptoms    GI/Hepatic negative GI ROS, Neg liver ROS,   Endo/Other  Hypothyroidism   Renal/GU negative Renal ROS     Musculoskeletal  (+) Arthritis ,   Abdominal   Peds  Hematology  (+) Blood dyscrasia, anemia ,   Anesthesia Other Findings Day of surgery medications reviewed with the patient.  Reproductive/Obstetrics                            Anesthesia Physical Anesthesia Plan  ASA: 3 and emergent  Anesthesia Plan: General   Post-op Pain Management:    Induction: Intravenous  PONV Risk Score and Plan: 3 and Dexamethasone and Ondansetron  Airway Management Planned: Oral ETT  Additional Equipment:   Intra-op Plan:   Post-operative Plan: Extubation in OR  Informed Consent: I have reviewed the patients History and Physical, chart, labs and discussed the procedure including the risks, benefits and alternatives for the proposed anesthesia with the patient or authorized representative who has indicated his/her understanding and acceptance.     Dental advisory given and Only emergency history available  Plan Discussed with: CRNA  Anesthesia Plan Comments:         Anesthesia Quick Evaluation

## 2020-08-15 NOTE — Progress Notes (Signed)
OT Cancellation Note  Patient Details Name: Vicki Gonzales MRN: [id number] DOB: 1965/01/21   Cancelled Treatment:    Reason Eval/Treat Not Completed: Medical issues which prohibited therapy.  Pt with psuedo aneurysm.  Will reattempt.  [name]., OTR/L Acute Rehabilitation Services Pager [phone] Office [phone]   [name] 08/15/2020, 1:56 PM

## 2020-08-15 NOTE — PMR Pre-admission (Signed)
PMR Admission Coordinator Pre-Admission Assessment  Patient: Vicki Gonzales is an 55 y.o., female MRN: [id number] DOB: 11-Mar-1965 Height:   Weight: 84.5 kg  Insurance Information HMO:     PPO: Yes     PCP:       IPA:       80/20:       OTHER: Group [id number] PRIMARY: BCBS Commercial       Policy#: [id number]      Subscriber: [name]: [name]      Phone#: [phone]     Fax#: [phone] Pre-Cert#: [id number]      Employer: [employer] Benefits:  Phone #: [phone]     Name:   Eff. Date: 09/07/19     Deduct: $5000 (met $198.74)      Out of Pocket Max: $7500 (met $1382.06)      Life Max: N/A CIR: 80%      SNF: 80% with 60 days limit/year Outpatient: 30 visit limit combined     Co-Pay: $50/visit Home Health: 80%      Co-Pay: 20% DME: 80%     Co-Pay: 20% Providers: in network  SECONDARY: none        Financial Counselor:       Phone#:   The Engineer, [employer]" for patients in Inpatient Rehabilitation Facilities with attached "Privacy Act [place] Records" was provided and verbally reviewed with: N/A  Emergency Contact Information Contact Information     Name Relation Home Work Mobile   Renick Spouse [phone]  [phone]       Current Medical History  Patient Admitting Diagnosis: CVA  History of Present Illness: A 55 y.o. female with PMH significant for HTN, hypothyroidism, migraines, spondylosis, who presented to [facility] ED  08/12/20 with dysarthric speech and a left facial droop and she was found to have a concern for right MCA stroke on CT head without contrast.  Her aspect score was an 8 with hypodensity in the right insula in the frontal curriculum.  CTA demonstrate a proximal ICA occlusion with distal intracranial reconstitution and possible subocclusive right MCA thrombus at the bifurcation with CT perfusion demonstrating a 25 cc penumbra in the right MCA territory. Given her symptoms and  concern for progression of her symptoms to a full-blown right MCA stroke, tPA was administered and the case was discussed with interventionalist team and a code IR was activated.  She was transferred to [facility] 08/12/20  for potential thrombectomy and angioplasty. On arrival to [facility], she was noted to have elevated blood pressure to 361 systolic by 224 diastolic per EMS.  Her blood pressure was not treated by EMS enroute. Initial plan was to evaluate her and take her to thrombectomy suite directly.  However, she endorsed a right frontal throbbing headache behind her right eye with some right occipital headache which she describes as an 8 out of 10 that started sometimes after tPA was administered.  She has never had a headache like this in the past.  So instead of taking her to IR, a stat CT head was obtained which demonstrated new small superficial densities in the right frontal lobe and they were indeterminate for trace subarachnoid hemorrhage versus cortical contrast staining versus cortical petechial hemorrhage.  MRI brain notable for a positive small volume subarachnoid hemorrhage. Pt. Underwent revascularization of occluded Rt ICA prox with stent assisted angioplasty 08/14/20. CIR was assisted in return  to PLOF.   Complete NIHSS TOTAL: 4  Patient's medical record from [facility]  has been reviewed by the rehabilitation admission coordinator and physician.  Past Medical History  Past Medical History:  Diagnosis Date   ADHD    Anxiety    Carpal tunnel syndrome on right    Depression    HTN (hypertension)    Hypothyroidism    Migraines    Spondylosis     Family History   family history is not on file.  Prior Rehab/Hospitalizations Has the patient had prior rehab or hospitalizations prior to admission? No  Has the patient had major surgery during 100 days prior to admission? Yes   Current Medications  Current Facility-Administered Medications:      stroke: mapping our early stages of recovery book, , Does not apply, Once, Khaliqdina, Salman, MD   0.9 %  sodium chloride infusion, , Intravenous, Continuous, Xu, Jindong, MD, Stopped at 08/14/20 1422   0.9 %  sodium chloride infusion, , Intravenous, Continuous, Deveshwar, Sanjeev, MD, Last Rate: 75 mL/hr at 08/15/20 0600, Infusion Verify at 08/15/20 0600   acetaminophen (TYLENOL) tablet 650 mg, 650 mg, Oral, Q4H PRN **OR** acetaminophen (TYLENOL) 160 MG/5ML solution 650 mg, 650 mg, Per Tube, Q4H PRN **OR** acetaminophen (TYLENOL) suppository 650 mg, 650 mg, Rectal, Q4H PRN, Khaliqdina, Salman, MD   acetaminophen (TYLENOL) tablet 650 mg, 650 mg, Oral, Q4H PRN, 650 mg at 08/14/20 2049 **OR** acetaminophen (TYLENOL) 160 MG/5ML solution 650 mg, 650 mg, Per Tube, Q4H PRN **OR** acetaminophen (TYLENOL) suppository 650 mg, 650 mg, Rectal, Q4H PRN, Deveshwar, Sanjeev, MD   aspirin chewable tablet 81 mg, 81 mg, Oral, Daily **OR** aspirin chewable tablet 81 mg, 81 mg, Per Tube, Daily, Deveshwar, Sanjeev, MD   atorvastatin (LIPITOR) tablet 80 mg, 80 mg, Oral, Daily, Xu, Jindong, MD, 80 mg at 08/13/20 1013   Chlorhexidine Gluconate Cloth 2 % PADS 6 each, 6 each, Topical, Daily, Khaliqdina, Salman, MD, 6 each at 08/14/20 0850   clevidipine (CLEVIPREX) infusion 0.5 mg/mL, 0-21 mg/hr, Intravenous, Continuous, Deveshwar, Sanjeev, MD, Last Rate: 8 mL/hr at 08/14/20 1934, 4 mg/hr at 08/14/20 1934   eptifibatide (INTEGRILIN) injection, , , PRN, Deveshwar, Sanjeev, MD, 1.5 mg at 08/14/20 1356   iohexol (OMNIPAQUE) 300 MG/ML solution 50 mL, 50 mL, Intra-arterial, Once PRN, Deveshwar, Sanjeev, MD   iohexol (OMNIPAQUE) 300 MG/ML solution 50 mL, 50 mL, Intra-arterial, Once PRN, Deveshwar, Sanjeev, MD   levothyroxine (SYNTHROID) tablet 50 mcg, 50 mcg, Oral, Q0600, Khaliqdina, Salman, MD, 50 mcg at 08/15/20 0534   niMODipine (NIMOTOP) capsule 60 mg, 60 mg, Oral, Once, Omohundro, Jennifer C, NP   ondansetron (ZOFRAN) injection  4 mg, 4 mg, Intravenous, Q6H PRN, Xu, Jindong, MD, 4 mg at 08/15/20 0820   pantoprazole (PROTONIX) EC tablet 40 mg, 40 mg, Oral, Daily, Xu, Jindong, MD, 40 mg at 08/13/20 1524   PARoxetine (PAXIL) tablet 10 mg, 10 mg, Oral, Daily, Khaliqdina, Salman, MD, 10 mg at 08/13/20 1013   senna-docusate (Senokot-S) tablet 1 tablet, 1 tablet, Oral, BID, Khaliqdina, Salman, MD   ticagrelor (BRILINTA) tablet 90 mg, 90 mg, Oral, BID, 90 mg at 08/14/20 2049 **OR** ticagrelor (BRILINTA) tablet 90 mg, 90 mg, Per Tube, BID, Deveshwar, Sanjeev, MD   traMADol (ULTRAM) tablet 50 mg, 50 mg, Oral, Q6H PRN, Xu, Jindong, MD, 50 mg at 08/14/20 1724   verapamil (ISOPTIN) injection, , , PRN, Deveshwar, Sanjeev, MD, 2.5 mg at 08/14/20 1329   Precautions / Restrictions Precautions Precautions: Fall Precaution Comments: L inattention, SBP < 140 Restrictions Weight   Bearing Restrictions: No   Has the patient had 2 or more falls or a fall with injury in the past year? No  Prior Activity Level Community (5-7x/wk): Pt. was active in the community PTA  Prior Functional Level Self Care: Did the patient need help bathing, dressing, using the toilet or eating? Independent  Indoor Mobility: Did the patient need assistance with walking from room to room (with or without device)? Independent  Stairs: Did the patient need assistance with internal or external stairs (with or without device)? Independent  Functional Cognition: Did the patient need help planning regular tasks such as shopping or remembering to take medications? Independent  Home Assistive Devices / Equipment Home Assistive Devices/Equipment: None Home Equipment: Shower seat, Hand held shower head, Walker - 2 wheels  Prior Device Use: Indicate devices/aids used by the patient prior to current illness, exacerbation or injury? None of the above  Current Functional Level Cognition  Arousal/Alertness: Awake/alert Overall Cognitive Status: Impaired/Different from  baseline Current Attention Level: Sustained, Selective (Reports having to "work hard" to focus when in distracting environment) Orientation Level: Oriented X4 Following Commands: Follows one step commands consistently, Follows multi-step commands inconsistently Safety/Judgement: Decreased awareness of safety, Decreased awareness of deficits General Comments: focused session on challenging ST memory, awareness, attention, and problem solving deficits. Pt participating in four part trail making task. Requiring Mod cues for completion. When providing instructions for four part trail making, pt requiring 5 repeats of directions due to decreased ST memory and attention Attention: Selective Selective Attention: Impaired Selective Attention Impairment: Verbal basic, Functional basic Memory: Appears intact Awareness: Impaired Awareness Impairment: Intellectual impairment Problem Solving: Impaired Problem Solving Impairment: Verbal complex Executive Function: Self Monitoring, Organizing Organizing: Impaired Organizing Impairment: Verbal basic Self Monitoring: Impaired Behaviors: Impulsive Safety/Judgment: Impaired    Extremity Assessment (includes Sensation/Coordination)  Upper Extremity Assessment: LUE deficits/detail LUE Deficits / Details: mild dysmetria noted. she is able to manipulate objects in Lt hand independently as long as she is looking at her hand due to Lt inattention LUE Sensation: decreased proprioception LUE Coordination: decreased gross motor, decreased fine motor  Lower Extremity Assessment: Defer to PT evaluation RLE Deficits / Details: Strength 5/5 LLE Deficits / Details: Grossly 5/5 except hip flexion 3+/5 LLE Sensation: decreased light touch    ADLs  Overall ADL's : Needs assistance/impaired Eating/Feeding: Supervision/ safety, Sitting, Bed level Grooming: Wash/dry hands, Wash/dry face, Brushing hair, Supervision/safety, Sitting Upper Body Bathing: Moderate  assistance, Sitting Lower Body Bathing: Maximal assistance, Sit to/from stand Upper Body Dressing : Moderate assistance, Sitting Lower Body Dressing: Moderate assistance, Sit to/from stand Lower Body Dressing Details (indicate cue type and reason): able to don socks EOB, but requires assist to pull pants over hips and assist for balance Toilet Transfer: Min guard, Ambulation, RW Toilet Transfer Details (indicate cue type and reason): cues for rw management but once instructed was able to maintain. Toileting- Clothing Manipulation and Hygiene: Total assistance, Sit to/from stand Functional mobility during ADLs: Min guard, Rolling walker General ADL Comments: Pt performing four part trail making task to challenge L inattention, balance, and cognition. Pt running into objects on L side ~50% of time (ie.e door frames, chairs, the wall). Pt requiring cues for recalling the four locations, problem solving finding third room, and maintaining attention. Pt very motivated. Able to verbalize deficits with Mod cues for increasing insight. Reviewing task at end of session; perseverating on main deficits being "not falling"    Mobility  Overal bed mobility: Independent Bed Mobility: Supine to   Sit, Sit to Supine Rolling: Min guard Sidelying to sit: Min guard Supine to sit: Independent Sit to supine: Independent Sit to sidelying: Min guard General bed mobility comments: Patient came to sitting edge of bed without assist. ALthough trying to get up with legs still attached to compression pumps. Decreased awareness.    Transfers  Overall transfer level: Needs assistance Equipment used: None Transfers: Sit to/from Stand Sit to Stand: Min guard Stand pivot transfers: Min assist General transfer comment: min guard A for safety    Ambulation / Gait / Stairs / Wheelchair Mobility  Ambulation/Gait Ambulation/Gait assistance: Counsellor (Feet): 300 Feet Assistive device: None (holding to gait  belt) Gait Pattern/deviations: Step-through pattern, Staggering right, Staggering left General Gait Details: min guard to min assist for balance. Patient reaching out for walls, railing in hallway for steadying at times. Became fatigued after walking about 150 feet and required standing rest at desk. Became less safe and steady with increased distance. And tried to enter room that was not hers. Decreased awareness of objects on left. Gait velocity: decreased Gait velocity interpretation: 1.31 - 2.62 ft/sec, indicative of limited community ambulator    Posture / Balance Dynamic Sitting Balance Sitting balance - Comments: no support needed for sitting edge of bed Balance Overall balance assessment: Needs assistance Sitting-balance support: Feet supported Sitting balance-Leahy Scale: Good Sitting balance - Comments: no support needed for sitting edge of bed Standing balance support: Single extremity supported, During functional activity Standing balance-Leahy Scale: Fair Standing balance comment: requires single UE support about 50% of the time due to unsteadiness.    Special needs/care consideration Skin Ecchymosis: groin/right; Surgical incision: groin/right, Continuous drip IV:0.9% sodium chloride infusion   Previous Home Environment (from acute therapy documentation) Living Arrangements: Spouse/significant other, Children  Lives With: Family Available Help at Discharge: Family, Available 24 hours/day Type of Home: House Home Layout: Two level Alternate Level Stairs-Rails: Right, Left Alternate Level Stairs-Number of Steps: 13 Home Access: Stairs to enter Entrance Stairs-Number of Steps: 1 Bathroom Shower/Tub: Scientist, research (life sciences): Yes How Accessible: Accessible via walker [place]: No Additional Comments: 1/2 bath main level  Discharge Living Setting Plans for Discharge Living Setting: Patient's home Type of Home at Discharge: House Discharge Home  Layout: Two level, 1/2 bath on main level Alternate Level Stairs-Rails: Right, Left Alternate Level Stairs-Number of Steps: 13 Discharge Home Access: Stairs to enter Entrance Stairs-Rails: None Entrance Stairs-Number of Steps: 1 Discharge Bathroom Shower/Tub: Tub/shower unit Discharge Bathroom Toilet: Standard Discharge Bathroom Accessibility: Yes How Accessible: Accessible via walker Does the patient have any problems obtaining your medications?: No  Social/Family/Support Systems Patient Roles: Spouse Contact Information: [phone] Anticipated Caregiver: Adelfa Lozito Anticipated Caregiver's Contact Information: [phone] Ability/Limitations of Caregiver: Can provide Min A Caregiver Availability: 24/7 Discharge Plan Discussed with Primary Caregiver: Yes Is Caregiver In Agreement with Plan?: Yes Does Caregiver/Family have Issues with Lodging/Transportation while Pt is in Rehab?: No  Goals Patient/Family Goal for Rehab: PT/OT/SLP min A Expected length of stay: 7-10 days Pt/Family Agrees to Admission and willing to participate: Yes Program Orientation Provided & Reviewed with Pt/Caregiver Including Roles  & Responsibilities: Yes  Decrease burden of Care through IP rehab admission: Specialzed equipment needs, Decrease number of caregivers, Bowel and bladder program, and Patient/family education  Possible need for SNF placement upon discharge: not anticipated   Patient Condition: I have reviewed medical records from [facility], spoken with CM, and patient and spouse. I met with patient at  the bedside for inpatient rehabilitation assessment.  Patient will benefit from ongoing PT, OT, and SLP, can actively participate in 3 hours of therapy a day 5 days of the week, and can make measurable gains during the admission.  Patient will also benefit from the coordinated team approach during an Inpatient Acute Rehabilitation admission.  The patient will receive  intensive therapy as well as Rehabilitation physician, nursing, social worker, and care management interventions.  Due to safety, skin/wound care, disease management, medication administration, pain management, and patient education the patient requires 24 hour a day rehabilitation nursing.  The patient is currently Independent-Min G with mobility and basic ADLs.  Discharge setting and therapy post discharge at home with home health is anticipated.  Patient has agreed to participate in the Acute Inpatient Rehabilitation Program and will admit today.  Preadmission Screen Completed By:  Laura Staley 08/15/20 and updated by: Eugenia M Logue, with day of admission updates by Lauren Graves Madden, MS, CCC-SLP 08/22/2020 10:38 AM ______________________________________________________________________   Discussed status with Dr. Patel on 08/22/20  at 10:38 AM and received approval for admission today.  Admission Coordinator:  Eugenia M Logue, RN, with day of admission updates by Lauren Graves Madden, MS, CCC-SLP time 10:38 AM/Date 08/22/20    Assessment/Plan: Diagnosis: CVA Does the need for close, 24 hr/day Medical supervision in concert with the patient's rehab needs make it unreasonable for this patient to be served in a less intensive setting? Potentially Co-Morbidities requiring supervision/potential complications: HTN (monitor and provide prns in accordance with increased physical exertion and pain), hypothyroidism, migraines, spondylosis Due to safety, disease management, and patient education, does the patient require 24 hr/day rehab nursing? Potentially Does the patient require coordinated care of a physician, rehab nurse, PT, OT, and SLP to address physical and functional deficits in the context of the above medical diagnosis(es)? Potentially Addressing deficits in the following areas: balance, endurance, locomotion, bathing, dressing, toileting, and psychosocial support Can the patient actively  participate in an intensive therapy program of at least 3 hrs of therapy 5 days a week? Yes The potential for patient to make measurable gains while on inpatient rehab is good Anticipated functional outcomes upon discharge from inpatient rehab: modified independent and supervision PT, modified independent and supervision OT, modified independent and supervision SLP Estimated rehab length of stay to reach the above functional goals is: 4-7 days. Anticipated discharge destination: Home 10. Overall Rehab/Functional Prognosis: good   MD Signature: Ankit Patel, MD, ABPMR 

## 2020-08-15 NOTE — Anesthesia Postprocedure Evaluation (Signed)
Anesthesia Post Note  Patient: Vicki Gonzales  Procedure(s) Performed: REPAIR RIGHT FEMORAL PSEUDOANEURYSM (Right: Groin)     Patient location during evaluation: PACU Anesthesia Type: General Level of consciousness: sedated Pain management: pain level controlled Vital Signs Assessment: post-procedure vital signs reviewed and stable Respiratory status: spontaneous breathing and respiratory function stable Cardiovascular status: stable Postop Assessment: no apparent nausea or vomiting Anesthetic complications: no   No notable events documented.  Last Vitals:  Vitals:   08/15/20 1915 08/15/20 1930  BP: 119/70 128/76  Pulse: 61 (!) 58  Resp: 15 17  Temp:  36.8 C  SpO2: 94% 94%    Last Pain:  Vitals:   08/15/20 1930  TempSrc:   PainSc: 0-No pain                 Bernarr Longsworth DANIEL

## 2020-08-15 NOTE — Op Note (Signed)
    Patient name: Vicki Gonzales MRN: [id number] DOB: [birth year] Sex: female  08/15/2020 Pre-operative Diagnosis: Right femoral pseudoaneurysm Post-operative diagnosis:  Same Surgeon:  Durene Cal Assistants:  Aggie Moats, PA Procedure:   Repair of right femoral pseudoaneurysm Anesthesia:  General Blood Loss:  Minimal Specimens:  none  Findings:  actively bleeding cannulation site at the femoral bifurcation, repaired with a 5-0 prolene  Indications: This is a 55 year old female who underwent neuro intervention yesterday through a right femoral approach.  She began having right groin pain and was found to have a pseudoaneurysm that was not amenable to percutaneous treatment.  I discussed with the patient and her husband proceeding with open surgical repair and they wish to proceed.  Procedure:  The patient was identified in the holding area and taken to [facility] OR ROOM 16  The patient was then placed supine on the table. general anesthesia was administered.  The patient was prepped and draped in the usual sterile fashion.  A time out was called and antibiotics were administered.  A PA was necessary to expedite the procedure and assist with technical details.  A longitudinal incision was made in the right groin.  Cautery was used to divide the subcutaneous tissue down to the inguinal ligament.  I then sharply dissected out the common femoral artery at this level and encircled it with a vessel loop.  I then proceeded with further dissection of the common femoral artery until I encountered the arteriotomy which was at the femoral bifurcation.  This was on the anterior surface of the artery.  I repaired this with a 5-0 Prolene U stitch.  Hemostasis was immediate.  There were brisk Doppler signals in the profunda and superficial femoral artery.  The wound was then irrigated.  Hemostasis was achieved.  Surgi-Flo was placed in the wound.  I then reapproximated the femoral sheath with 2-0 Vicryl.  The  subcutaneous tissue was then closed with 2 additional layers of Vicryl followed by subcuticular closure and Dermabond.  The patient was successfully extubated and taken to recovery room in stable condition.  There were no immediate complications.   Disposition: To PACU stable.   Juleen China, M.D., [facility] [place] Office: [phone] Pager:  [phone]

## 2020-08-15 NOTE — Progress Notes (Signed)
Inpatient Rehab Admissions Coordinator:   Met with patient at bedside to discuss potential CIR admission. Pt. Stated interest. Will pursue for potential admit, pending bed availability, medical readiness, and insurance auth.    Clemens Catholic, [place], [place] Admissions Coordinator  [phone] ([place]) [phone] (office)

## 2020-08-15 NOTE — Progress Notes (Signed)
Referring Physician(s): Dr. Roda Shutters  Supervising Physician: Julieanne Cotton  Patient Status:  [facility] - In-pt  Chief Complaint: Right MCA infarct due to right ICA occlusion s/p four vessel cerebral arteriogram with revascularization of occluded proximal right ICA via stent-assisted angioplasty 08/14/20  Subjective: Patient asleep in bed, arousable but groggy. Female family member at the bedside. Patient denies any significant pain or discomfort, just states she is very tired. She is able to answer all questions and follow commands appropriately.   Allergies: Keflex [cephalexin]  Medications: Prior to Admission medications   Medication Sig Start Date End Date Taking? Authorizing Provider  acetaminophen (TYLENOL) 500 MG tablet Take 500 mg by mouth every 6 (six) hours as needed for mild pain, fever or headache.   Yes [provider]  levothyroxine (SYNTHROID) 50 MCG tablet Take 50 mcg by mouth every morning. 05/10/20  Yes [provider]  lisinopril-hydrochlorothiazide (ZESTORETIC) 20-12.5 MG tablet Take 1 tablet by mouth daily. 06/18/20  Yes [provider]  PARoxetine (PAXIL) 10 MG tablet Take 10 mg by mouth daily. 05/04/20  Yes [provider]     Vital Signs: BP 107/68   Pulse 60   Temp 98.6 F (37 C) (Oral)   Resp 19   LMP 04/07/2019 (Approximate)   SpO2 97%   Physical Exam Constitutional:      Comments: Sleepy but arousable. Answers questions/follows commands appropriately  Cardiovascular:     Rate and Rhythm: Normal rate and regular rhythm.     Comments: Left a-line intact. Right groin vascular site with diffuse hematoma and tenderness to palpation. Dressing is clean and dry.  Pulmonary:     Effort: Pulmonary effort is normal.  Skin:    General: Skin is warm and dry.  Neurological:     Mental Status: She is oriented to person, place, and time.     Cranial Nerves: Dysarthria present.     Motor: Weakness present.     Comments: Mild  dysarthria, mild coordination impairment. Left-sided weakness but able to raise both arms/both legs.     Imaging: CT HEAD WO CONTRAST ( )  Result Date: 08/12/2020 CLINICAL DATA:  Cerebral hemorrhage suspected EXAM: CT HEAD WITHOUT CONTRAST TECHNIQUE: Contiguous axial images were obtained from the base of the skull through the vertex without intravenous contrast. COMPARISON:  None. FINDINGS: Brain: There is an early subacute infarct of the right frontal operculum and insula. Faint subarachnoid hemorrhage over the right frontal lobe is unchanged. There is no new site of hemorrhage. No midline shift or other mass effect. Vascular: No abnormal hyperdensity of the major intracranial arteries or dural venous sinuses. No intracranial atherosclerosis. Skull: The visualized skull base, calvarium and extracranial soft tissues are normal. Sinuses/Orbits: No fluid levels or advanced mucosal thickening of the visualized paranasal sinuses. No mastoid or middle ear effusion. The orbits are normal. IMPRESSION: 1. Unchanged appearance of early subacute infarct of the right frontal operculum and insula. 2. Unchanged faint subarachnoid hemorrhage over the right frontal lobe. 3. No new site of hemorrhage. Electronically Signed   By: Deatra Robinson M.D.   On: 08/12/2020 23:38   CT HEAD WO CONTRAST ( )  Result Date: 08/12/2020 CLINICAL DATA:  Follow-up subarachnoid hemorrhage EXAM: CT HEAD WITHOUT CONTRAST TECHNIQUE: Contiguous axial images were obtained from the base of the skull through the vertex without intravenous contrast. COMPARISON:  08/12/2020 FINDINGS: Brain: There appears to continue to be a small amount of subarachnoid hemorrhage in the right frontal region, unchanged since prior study. No  new areas of hemorrhage. No hydrocephalus or acute infarction. Vascular: No hyperdense vessel or unexpected calcification. Skull: No acute calvarial abnormality. Sinuses/Orbits: No acute findings Other: None IMPRESSION: Stable  minimal subarachnoid hemorrhage in the right frontal region, unchanged. Electronically Signed   By: Charlett Nose M.D.   On: 08/12/2020 18:49   CT HEAD WO CONTRAST ( )  Result Date: 08/12/2020 CLINICAL DATA:  Cerebral hemorrhage suspected. Follow-up hemorrhage status post tPA. EXAM: CT HEAD WITHOUT CONTRAST TECHNIQUE: Contiguous axial images were obtained from the base of the skull through the vertex without intravenous contrast. COMPARISON:  Head CT and MRI earlier today. FINDINGS: Brain: Minimal right frontal subarachnoid hemorrhage is unchanged in extent from the earlier CT though is slightly less conspicuous (potentially related to different scanners). No new intracranial hemorrhage, extra-axial fluid collection, mass, or midline shift is identified. Cytotoxic edema related to an acute right MCA infarct in the insula and frontal operculum is unchanged. The ventricles are normal in size. Vascular: Residual intravascular contrast material. Skull: No fracture or suspicious osseous lesion. Sinuses/Orbits: Minimal mucosal thickening in the right maxillary sinus. Clear mastoid air cells. Unremarkable orbits. Other: None. IMPRESSION: 1. Unchanged trace subarachnoid hemorrhage. 2. Unchanged acute right MCA infarct. Electronically Signed   By: Sebastian Ache M.D.   On: 08/12/2020 15:56   CT HEAD WO CONTRAST ( )  Result Date: 08/12/2020 CLINICAL DATA:  Cerebral hemorrhage suspected. Headache following tPA. EXAM: CT HEAD WITHOUT CONTRAST TECHNIQUE: Contiguous axial images were obtained from the base of the skull through the vertex without intravenous contrast. COMPARISON:  Head CT earlier today FINDINGS: Brain: Hypodensity in the right insula and right frontal operculum is similar in extent to today's earlier CT and consistent with an acute infarct. There is a small amount of scattered curvilinear hyperdensity in the right frontal lobe which is indeterminate for trace subarachnoid hemorrhage versus cortical contrast  staining or cortical petechial hemorrhage. The ventricles are normal in size. No mass, midline shift, or extra-axial fluid collection is identified. Vascular: Residual intravascular contrast from earlier CTA. Skull: No fracture or suspicious osseous lesion. Sinuses/Orbits: Visualized paranasal sinuses and mastoid air cells are clear. Unremarkable orbits. Other: None. IMPRESSION: 1. New small superficial densities in the right frontal lobe, indeterminate for trace subarachnoid hemorrhage versus cortical contrast staining or cortical petechial hemorrhage. 2. Unchanged extent of acute right MCA infarct involving the insula and frontal operculum. The study was discussed by telephone with Dr. Erick Blinks on 08/12/2020 at 2:15 p.m. Electronically Signed   By: Sebastian Ache M.D.   On: 08/12/2020 14:42   MR ANGIO HEAD WO CONTRAST  Result Date: 08/13/2020 CLINICAL DATA:  Neuro deficit, acute, stroke suspected. EXAM: MRI HEAD WITHOUT CONTRAST MRA HEAD WITHOUT CONTRAST TECHNIQUE: Multiplanar, multi-echo pulse sequences of the brain and surrounding structures were acquired without intravenous contrast. Angiographic images of the Circle of Willis were acquired using MRA technique without intravenous contrast. COMPARISON:  Head CT and head and neck CTA August 12, 2020. FINDINGS: MRI HEAD FINDINGS Brain: Area of restricted diffusion involving the right perirolandic region, corresponding to acute infarct, new from prior CT. Area of restricted diffusion involving the right frontal operculum and insula and focus of restricted diffusion in the right posterior temporal lobe, corresponding to acute/subacute infarcts, seen on prior CT. Foci of susceptibility artifact are seen in the right frontal lobe, parietal lobe, insula and temporal lobe, within the areas of restricted diffusion, consistent with petechial hemorrhage. There is also susceptibility artifact in right frontal high convexity sulci, corresponding to  subarachnoid  hemorrhage seen on prior studies. Scattered foci of T2 hyperintensity are seen within the white matter of the cerebral hemispheres and within the pons, nonspecific, most likely related to chronic small vessel ischemia. Vascular: Normal flow voids. Skull and upper cervical spine: Normal marrow signal. Sinuses/Orbits: Trace mucosal thickening throughout the paranasal sinuses and small mucous retention cyst in the right maxillary sinus. The orbits are maintained. Other: None. MRA HEAD FINDINGS Anterior circulation: Absent flow related enhancement in the upper cervical and intracranial right internal carotid artery with reconstitution of flow at the ophthalmic segment with diminished caliber. Normal caliber and flow related enhancement of the upper cervical and intracranial left ICA. Normal flow related enhancement of the right M1/MCA segment with occlusion of a right M3/MCA anterior division branch. The bilateral ACA and left MCA vascular trees are preserved. Posterior circulation: Absent flow related enhancement in the proximal left vertebral artery with normal flow distal to the PICA origin. The dominant right vertebral artery has normal course and caliber. The basilar artery and bilateral posterior cerebral arteries are unremarkable. Anatomic variants: None significant. IMPRESSION: 1. Multifocal areas of right MCA territory infarct including a new area in the right perirolandic region when compared to prior CT. 2. Few small foci of susceptibility artifact within the areas of restricted diffusion corresponding to petechial hemorrhage. 3. Occlusion of the intracranial right ICA with reconstitution at the ophthalmic segment. 4. Occlusion of a right M3/MCA anterior division branch. 5. Stable trace right subarachnoid hemorrhage. 6. Moderate chronic white matter disease. These results were called by telephone at the time of interpretation on 08/13/2020 at 1:17 pm to Dr. Marvel PlanJINDONG XU , who verbally acknowledged these results.  Electronically Signed   By: Baldemar LenisKatyucia  De Macedo Rodrigues M.D.   On: 08/13/2020 13:18   MR BRAIN WO CONTRAST  Result Date: 08/13/2020 CLINICAL DATA:  Neuro deficit, acute, stroke suspected. EXAM: MRI HEAD WITHOUT CONTRAST MRA HEAD WITHOUT CONTRAST TECHNIQUE: Multiplanar, multi-echo pulse sequences of the brain and surrounding structures were acquired without intravenous contrast. Angiographic images of the Circle of Willis were acquired using MRA technique without intravenous contrast. COMPARISON:  Head CT and head and neck CTA August 12, 2020. FINDINGS: MRI HEAD FINDINGS Brain: Area of restricted diffusion involving the right perirolandic region, corresponding to acute infarct, new from prior CT. Area of restricted diffusion involving the right frontal operculum and insula and focus of restricted diffusion in the right posterior temporal lobe, corresponding to acute/subacute infarcts, seen on prior CT. Foci of susceptibility artifact are seen in the right frontal lobe, parietal lobe, insula and temporal lobe, within the areas of restricted diffusion, consistent with petechial hemorrhage. There is also susceptibility artifact in right frontal high convexity sulci, corresponding to subarachnoid hemorrhage seen on prior studies. Scattered foci of T2 hyperintensity are seen within the white matter of the cerebral hemispheres and within the pons, nonspecific, most likely related to chronic small vessel ischemia. Vascular: Normal flow voids. Skull and upper cervical spine: Normal marrow signal. Sinuses/Orbits: Trace mucosal thickening throughout the paranasal sinuses and small mucous retention cyst in the right maxillary sinus. The orbits are maintained. Other: None. MRA HEAD FINDINGS Anterior circulation: Absent flow related enhancement in the upper cervical and intracranial right internal carotid artery with reconstitution of flow at the ophthalmic segment with diminished caliber. Normal caliber and flow related  enhancement of the upper cervical and intracranial left ICA. Normal flow related enhancement of the right M1/MCA segment with occlusion of a right M3/MCA anterior division branch. The  bilateral ACA and left MCA vascular trees are preserved. Posterior circulation: Absent flow related enhancement in the proximal left vertebral artery with normal flow distal to the PICA origin. The dominant right vertebral artery has normal course and caliber. The basilar artery and bilateral posterior cerebral arteries are unremarkable. Anatomic variants: None significant. IMPRESSION: 1. Multifocal areas of right MCA territory infarct including a new area in the right perirolandic region when compared to prior CT. 2. Few small foci of susceptibility artifact within the areas of restricted diffusion corresponding to petechial hemorrhage. 3. Occlusion of the intracranial right ICA with reconstitution at the ophthalmic segment. 4. Occlusion of a right M3/MCA anterior division branch. 5. Stable trace right subarachnoid hemorrhage. 6. Moderate chronic white matter disease. These results were called by telephone at the time of interpretation on 08/13/2020 at 1:17 pm to Dr. Marvel Plan , who verbally acknowledged these results. Electronically Signed   By: Baldemar Lenis M.D.   On: 08/13/2020 13:18   MR BRAIN WO CONTRAST  Result Date: 08/12/2020 CLINICAL DATA:  Brain mass or lesion. Code stroke. Trace subarachnoid hemorrhage versus cortical contrast staining or cortical petechial hemorrhage on CT following tPA. EXAM: MRI HEAD WITHOUT CONTRAST TECHNIQUE: Multiplanar, multiecho pulse sequences of the brain and surrounding structures were obtained without intravenous contrast. COMPARISON:  Head CT 08/12/2020 FINDINGS: A limited study consisting of only axial susceptibility weighted imaging was performed at the direction of the stroke neurologist. There is mild-to-moderate motion artifact, however curvilinear foci of susceptibility  artifact are identified in several right frontal sulci most consistent with small volume acute subarachnoid hemorrhage when correlating with the preceding CT. There may also be minimal subarachnoid hemorrhage in the right parietal region. IMPRESSION: Positive for small volume subarachnoid hemorrhage. These results were called by telephone at the time of interpretation on 08/12/2020 at 2:53 p.m. to Dr. Erick Blinks, who verbally acknowledged these results. Electronically Signed   By: Sebastian Ache M.D.   On: 08/12/2020 15:03   ECHOCARDIOGRAM COMPLETE  Result Date: 08/13/2020    ECHOCARDIOGRAM REPORT   Patient Name:   Vicki Gonzales Date of Exam: 08/13/2020 Medical Rec #:  [id number]               Height:       66.0 in Accession #:    [id number]              Weight:       186.3 lb Date of Birth:  [birth year]               BSA:          1.940 m Patient Age:    54 years                BP:           130/80 mmHg Patient Gender: F                       HR:           69 bpm. Exam Location:  Inpatient Procedure: 2D Echo, Cardiac Doppler and Color Doppler Indications:    CVA  History:        Patient has no prior history of Echocardiogram examinations.                 Risk Factors:Hypertension.  Sonographer:    Lavenia Atlas Referring Phys: [id number] [facility] IMPRESSIONS  1. Left ventricular ejection fraction, by estimation,  is 60 to 65%. The left ventricle has normal function. The left ventricle has no regional wall motion abnormalities. Left ventricular diastolic parameters are consistent with Grade I diastolic dysfunction (impaired relaxation).  2. Right ventricular systolic function is normal. The right ventricular size is normal. There is mildly elevated pulmonary artery systolic pressure.  3. The mitral valve is normal in structure. No evidence of mitral valve regurgitation. No evidence of mitral stenosis.  4. The aortic valve is tricuspid. Aortic valve regurgitation is not visualized. Mild aortic  valve sclerosis is present, with no evidence of aortic valve stenosis.  5. The inferior vena cava is normal in size with greater than 50% respiratory variability, suggesting right atrial pressure of 3 mmHg. Comparison(s): No prior Echocardiogram. FINDINGS  Left Ventricle: Left ventricular ejection fraction, by estimation, is 60 to 65%. The left ventricle has normal function. The left ventricle has no regional wall motion abnormalities. The left ventricular internal cavity size was normal in size. There is  no left ventricular hypertrophy. Left ventricular diastolic parameters are consistent with Grade I diastolic dysfunction (impaired relaxation). Right Ventricle: The right ventricular size is normal. Right ventricular systolic function is normal. There is mildly elevated pulmonary artery systolic pressure. The tricuspid regurgitant velocity is 2.95 m/s, and with an assumed right atrial pressure of 3 mmHg, the estimated right ventricular systolic pressure is 37.8 mmHg. Left Atrium: Left atrial size was normal in size. Right Atrium: Right atrial size was normal in size. Pericardium: There is no evidence of pericardial effusion. Mitral Valve: The mitral valve is normal in structure. Mild mitral annular calcification. No evidence of mitral valve regurgitation. No evidence of mitral valve stenosis. Tricuspid Valve: The tricuspid valve is normal in structure. Tricuspid valve regurgitation is mild . No evidence of tricuspid stenosis. Aortic Valve: The aortic valve is tricuspid. Aortic valve regurgitation is not visualized. Mild aortic valve sclerosis is present, with no evidence of aortic valve stenosis. Pulmonic Valve: The pulmonic valve was not well visualized. Pulmonic valve regurgitation is not visualized. No evidence of pulmonic stenosis. Aorta: The aortic root is normal in size and structure. Venous: The inferior vena cava is normal in size with greater than 50% respiratory variability, suggesting right atrial  pressure of 3 mmHg. IAS/Shunts: No atrial level shunt detected by color flow Doppler.  LEFT VENTRICLE PLAX 2D LVIDd:         4.40 cm  Diastology LVIDs:         2.80 cm  LV e' medial:    6.20 cm/s LV PW:         1.10 cm  LV E/e' medial:  11.7 LV IVS:        1.10 cm  LV e' lateral:   6.53 cm/s LVOT diam:     2.00 cm  LV E/e' lateral: 11.1 LV SV:         60 LV SV Index:   31 LVOT Area:     3.14 cm  RIGHT VENTRICLE RV Basal diam:  3.30 cm RV S prime:     8.92 cm/s TAPSE (M-mode): 2.9 cm LEFT ATRIUM             Index       RIGHT ATRIUM           Index LA diam:        4.00 cm 2.06 cm/m  RA Area:     13.40 cm LA Vol (A2C):   31.1 ml 16.03 ml/m RA Volume:   34.60  ml  17.83 ml/m LA Vol (A4C):   42.7 ml 22.01 ml/m LA Biplane Vol: 38.1 ml 19.64 ml/m  AORTIC VALVE LVOT Vmax:   108.00 cm/s LVOT Vmean:  72.400 cm/s LVOT VTI:    0.191 m  AORTA Ao Root diam: 2.70 cm MITRAL VALVE               TRICUSPID VALVE MV Area (PHT): 3.24 cm    TR Peak grad:   34.8 mmHg MV Decel Time: 234 msec    TR Vmax:        295.00 cm/s MV E velocity: 72.40 cm/s MV A velocity: 87.80 cm/s  SHUNTS MV E/A ratio:  0.82        Systemic VTI:  0.19 m                            Systemic Diam: 2.00 cm Olga Millers MD Electronically signed by Olga Millers MD Signature Date/Time: 08/13/2020/12:12:42 PM    Final    CT HEAD CODE STROKE WO CONTRAST  Result Date: 08/12/2020 CLINICAL DATA:  Code stroke.  Slurred speech EXAM: CT HEAD WITHOUT CONTRAST TECHNIQUE: Contiguous axial images were obtained from the base of the skull through the vertex without intravenous contrast. COMPARISON:  None. FINDINGS: Brain: Cytotoxic edema appearance in the right insula and frontal operculum no ganglionic or supraganglionic infarct is seen. No acute hemorrhage, hydrocephalus, or collection. Vascular: Hyperdense right M1 suggested on sagittal images, although potentially accentuated by acute infarct in not persistent on the other planes. Skull: No acute finding  Sinuses/Orbits: Negative Other: Critical Value/emergent results were called by telephone at the time of interpretation on 08/12/2020 at 11:43 am to provider Dorothea Glassman , who verbally acknowledged these results. ASPECTS [facility] Stroke Program Early CT Score) - Ganglionic level infarction (caudate, lentiform nuclei, internal capsule, insula, M1-M3 cortex): 5 - Supraganglionic infarction (M4-M6 cortex): 3 Total score (0-10 with 10 being normal): 8 IMPRESSION: 1. Acute infarct in the right insula and frontal operculum, ASPECTS is 8. 2.  Equivocal hyperdensity of the right M1. 3. No acute hemorrhage. Electronically Signed   By: Marnee Spring M.D.   On: 08/12/2020 11:45   CT ANGIO HEAD NECK W WO CM W PERF (CODE STROKE)  Result Date: 08/12/2020 CLINICAL DATA:  Slurred speech. EXAM: CT ANGIOGRAPHY HEAD AND NECK CT PERFUSION BRAIN TECHNIQUE: Multidetector CT imaging of the head and neck was performed using the standard protocol during bolus administration of intravenous contrast. Multiplanar CT image reconstructions and MIPs were obtained to evaluate the vascular anatomy. Carotid stenosis measurements (when applicable) are obtained utilizing NASCET criteria, using the distal internal carotid diameter as the denominator. Multiphase CT imaging of the brain was performed following IV bolus contrast injection. Subsequent parametric perfusion maps were calculated using RAPID software. CONTRAST:  OMNIPAQUE IOHEXOL 350 MG/ML SOLN COMPARISON:  None. FINDINGS: CTA NECK FINDINGS Aortic arch: Standard 3 vessel aortic arch with widely patent arch vessel origins. Right carotid system: The common carotid artery is patent. There is predominantly soft plaque at the carotid bifurcation, and the ICA is occluded proximally. Left carotid system: Patent without evidence of stenosis or dissection. Vertebral arteries: The right vertebral artery is patent and dominant without evidence of a significant stenosis or dissection. The left  vertebral artery is occluded at its origin, and there is distal reconstitution of small V2 and V3 segments. Skeleton: No acute osseous abnormality or suspicious osseous lesion. Mild cervical spondylosis. Asymmetrically advanced  left facet arthrosis at C2-3. Other neck: No evidence of cervical lymphadenopathy or mass. Upper chest: Clear lung apices. Review of the MIP images confirms the above findings CTA HEAD FINDINGS Anterior circulation: There is reconstitution of the right ICA beginning in the mid to distal petrous segment with the vessel being patent more distally through the terminus though small in caliber and irregular including a possible severe distal cavernous segment stenosis. The right MCA is patent proximally, however there is diminished enhancement and poor delineation of the right MCA bifurcation suspicious for a subocclusive embolus or severe stenosis. The branches distal to this are grossly patent. The intracranial left ICA is patent with mild atherosclerotic plaque not resulting in a significant stenosis. The left MCA and left ACA are patent without evidence of a flow limiting proximal stenosis. The right ACA is patent although the right A1 segment is small and irregular with moderate narrowing in its midportion. No aneurysm is identified. Posterior circulation: The intracranial vertebral arteries are patent to the basilar. Patent PICA and SCA origins are seen bilaterally. The basilar artery is widely patent. There are small right and diminutive or absent left posterior communicating arteries. The PCAs are patent without evidence of a significant proximal stenosis. No aneurysm is identified. Venous sinuses: Patent. Anatomic variants: None. Review of the MIP images confirms the above findings CT Brain Perfusion Findings: ASPECTS: 8 CBF (<30%) Volume: 43mL Perfusion (Tmax>6.0s) volume: 38mL Mismatch Volume: 89mL Infarction Location: The core infarct in the right insula and right frontal operculum on  the earlier noncontrast head CT is not detected by the automated RAPID processing. The 25 mL penumbra indicated by RAPID appears to be located slightly posterior to the infarct on CT. IMPRESSION: 1. 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 2. Finding suspicious for a subocclusive embolus versus severe stenosis at the right MCA bifurcation. 3. Occlusion of the left vertebral artery at its origin with distal reconstitution. 4. CTP results as detailed above including a 25 mL penumbra. These results were communicated to Dr. Selina Cooley at 12:31 pm on 08/12/2020 by text page via the [facility] messaging system. Electronically Signed   By: Sebastian Ache M.D.   On: 08/12/2020 12:54    Labs:  CBC: Recent Labs    08/12/20 1148 08/13/20 0700 08/14/20 0340 08/15/20 0536  WBC 11.9* 8.6 10.4 12.6*  HGB 13.9 13.2 12.6 11.0*  HCT 41.6 40.4 37.1 32.8*  PLT 336 350 307 277    COAGS: Recent Labs    08/12/20 1148 08/14/20 0340  INR 1.0 1.0  APTT 26  --     BMP: Recent Labs    08/12/20 1148 08/13/20 0700 08/14/20 0340 08/15/20 0536  NA 136 139 141 140  K 3.8 3.6 3.3* 2.8*  CL 99 103 110 109  CO2 27 28 22  21*  GLUCOSE 107* 115* 125* 97  BUN 16 14 8 6   CALCIUM 9.4 9.2 8.9 8.3*  CREATININE 0.62 0.84 0.63 0.53  GFRNONAA >60 >60 >60 >60    LIVER FUNCTION TESTS: Recent Labs    08/12/20 1148  BILITOT 1.3*  AST 21  ALT 22  ALKPHOS 70  PROT 7.7  ALBUMIN 4.3    Assessment and Plan:  Right MCA infarct due to right ICA occlusion s/p four vessel cerebral arteriogram with revascularization of occluded proximal right ICA via stent-assisted angioplasty 08/14/20  Patient with mild dysarthria, left-sided weakness and mild coordination deficits. Right femoral vascular site with diffuse hematoma and tenderness to palpation.   Vascular ultrasound  orders placed to evaluate for right femoral pseudoaneurysm and assess bilateral carotids. Preliminary report of right groin shows a 2.3 x 2 cm  pseudoaneurysm. Vascular surgery has been consulted. Patient will be kept NPO and with orders to continue lying flat.   Additional NIR plans for today include weaning cleviprex, discontinuing arterial line and administering PO brilinta and aspirin. The patient is being evaluated for inpatient rehab when medically stable.   NIR to follow. Other plans per primary teams.   Electronically Signed: Alwyn Ren, AGACNP-BC [phone] 08/15/2020, 9:03 AM   I spent a total of 15 Minutes at the the patient's bedside AND on the patient's hospital floor or unit, greater than 50% of which was counseling/coordinating care for Right MCA s/p intervention

## 2020-08-16 ENCOUNTER — Encounter (HOSPITAL_COMMUNITY): Payer: Self-pay | Admitting: Surgery

## 2020-08-16 DIAGNOSIS — G4733 Obstructive sleep apnea (adult) (pediatric): Secondary | ICD-10-CM

## 2020-08-16 DIAGNOSIS — I724 Aneurysm of artery of lower extremity: Secondary | ICD-10-CM

## 2020-08-16 LAB — CBC
HCT: 31.9 % — ABNORMAL LOW (ref 36.0–46.0)
Hemoglobin: 10.5 g/dL — ABNORMAL LOW (ref 12.0–15.0)
MCH: 28.7 pg (ref 26.0–34.0)
MCHC: 32.9 g/dL (ref 30.0–36.0)
MCV: 87.2 fL (ref 80.0–100.0)
Platelets: 276 10*3/uL (ref 150–400)
RBC: 3.66 MIL/uL — ABNORMAL LOW (ref 3.87–5.11)
RDW: 13.8 % (ref 11.5–15.5)
WBC: 11.1 10*3/uL — ABNORMAL HIGH (ref 4.0–10.5)
nRBC: 0 % (ref 0.0–0.2)

## 2020-08-16 LAB — TSH: TSH: 1.578 u[IU]/mL (ref 0.350–4.500)

## 2020-08-16 LAB — BASIC METABOLIC PANEL
Anion gap: 9 (ref 5–15)
BUN: 9 mg/dL (ref 6–20)
CO2: 22 mmol/L (ref 22–32)
Calcium: 8.7 mg/dL — ABNORMAL LOW (ref 8.9–10.3)
Chloride: 109 mmol/L (ref 98–111)
Creatinine, Ser: 0.55 mg/dL (ref 0.44–1.00)
GFR, Estimated: >60 mL/min (ref >60)
Glucose, Bld: 134 mg/dL — ABNORMAL HIGH (ref 70–99)
Potassium: 3.3 mmol/L — ABNORMAL LOW (ref 3.5–5.1)
Sodium: 140 mmol/L (ref 135–145)

## 2020-08-16 LAB — T4, FREE: Free T4: 1.11 ng/dL (ref 0.61–1.12)

## 2020-08-16 MED ORDER — LABETALOL HCL 5 MG/ML IV SOLN: 5.0000 mg | INTRAVENOUS | Status: DC | PRN

## 2020-08-16 MED ORDER — ENOXAPARIN SODIUM 40 MG/0.4ML IJ SOSY
40.0000 mg | PREFILLED_SYRINGE | INTRAMUSCULAR | Status: DC
  Administered 2020-08-16 – 2020-08-22 (×7): 40 mg via SUBCUTANEOUS
  Filled 2020-08-16 (×7): qty 0.4

## 2020-08-16 NOTE — Evaluation (Signed)
Speech Language Pathology Evaluation Patient Details Name: Vicki Gonzales MRN: [id number] DOB: 11/19/1965 Today's Date: 08/16/2020 Time: 5397-6734 SLP Time Calculation (min) (ACUTE ONLY): 12 min  Problem List:  Patient Active Problem List   Diagnosis Date Noted   Internal carotid artery occlusion, right 08/14/2020   Acute right MCA stroke (HCC) 08/12/2020   Cerebrovascular accident (CVA) (HCC)    Past Medical History:  Past Medical History:  Diagnosis Date   ADHD    Anxiety    Carpal tunnel syndrome on right    Depression    HTN (hypertension)    Hypothyroidism    Migraines    Spondylosis    Past Surgical History:  Past Surgical History:  Procedure Laterality Date   ANTERIOR CRUCIATE LIGAMENT REPAIR Left 2021   APPENDECTOMY     FALSE ANEURYSM REPAIR Right 08/15/2020   Procedure: REPAIR RIGHT FEMORAL PSEUDOANEURYSM;  Surgeon: Nada Libman, MD;  Location: [facility] OR;  Service: Vascular;  Laterality: Right;   IR ANGIO INTRA EXTRACRAN SEL COM CAROTID INNOMINATE UNI L MOD SED  08/14/2020   IR ANGIO INTRA EXTRACRAN SEL INTERNAL CAROTID UNI R MOD SED  08/14/2020   IR ANGIO VERTEBRAL SEL SUBCLAVIAN INNOMINATE BILAT MOD SED  08/14/2020   IR CT HEAD LTD  08/14/2020   IR CT HEAD LTD  08/14/2020   IR INTRA CRAN STENT  08/14/2020   IR INTRAVSC STENT CERV CAROTID W/O EMB-PROT MOD SED INC ANGIO  08/14/2020   RADIOLOGY WITH ANESTHESIA N/A 08/14/2020   Procedure: IR WITH ANESTHESIA;  Surgeon: Julieanne Cotton, MD;  Location: [facility] OR;  Service: Radiology;  Laterality: N/A;   TUBAL LIGATION     HPI:  55 y.o. F who presents with dysarthric speech and left facial droop. Found to have a developing R MCA stroke. tPA given MRI confirmed a small volume R frontal SAH. Thrombectomy was not pursued and tPA was not reversed immediately due to concern for progression of R MCA stroke. Began to have groin pain and found to have active bleeding cannulation site at femoral bifurcation and underwent repair of right  femoral pseudoaneurysm 8/10. Significant PMH: HTN, migraines.   Assessment / Plan / Recommendation Clinical Impression  Pt presents with deficits consistent with Right Hemisphere Communication Disorder, marked by inattention, deficits in pragmatics/social communication, decreased awareness and judgment, impaired reasoning, and impulsivity.  Pt's short-term recall is a relative strength; speech intelligibility is improved since admission; receptive and expressive language are WNL, as expected given site of lesion. Pt will benefit from SLP intervention while admitted to acute care; given her youth and independence PTA she would benefit from CIR for rehab. SLP wil follow.    SLP Assessment  SLP Recommendation/Assessment: Patient needs continued Speech Lanaguage Pathology Services SLP Visit Diagnosis: Cognitive communication deficit (R41.841)    Follow Up Recommendations  Inpatient Rehab    Frequency and Duration min 2x/week  2 weeks      SLP Evaluation Cognition  Overall Cognitive Status: Impaired/Different from baseline Arousal/Alertness: Awake/alert Orientation Level: Oriented X4 Attention: Selective Selective Attention: Impaired Selective Attention Impairment: Verbal basic;Functional basic Memory: Appears intact Awareness: Impaired Awareness Impairment: Intellectual impairment Problem Solving: Impaired Problem Solving Impairment: Verbal complex Executive Function: Self Monitoring;Organizing Organizing: Impaired Organizing Impairment: Verbal basic Self Monitoring: Impaired Behaviors: Impulsive Safety/Judgment: Impaired       Comprehension  Auditory Comprehension Overall Auditory Comprehension: Appears within functional limits for tasks assessed    Expression Expression Primary Mode of Expression: Verbal Verbal Expression Overall Verbal Expression: Appears  within functional limits for tasks assessed Written Expression Dominant Hand: Right   Oral / Motor  Motor  Speech Overall Motor Speech: Appears within functional limits for tasks assessed   GO                   Alzina Golda L. Samson Frederic, MA CCC/SLP Acute Rehabilitation Services Office number [phone] Pager [phone]  Carolan Shiver 08/16/2020, 4:36 PM

## 2020-08-16 NOTE — Progress Notes (Signed)
STROKE TEAM PROGRESS NOTE   INTERVAL HISTORY Her husband and son are at the bedside.  Patient awake alert and left sided motor strength and ataxia and numbness continues to improve. She had right femoral artery pseudoaneurysm repair by [facility] yesterday, doing well today and able to be off bed.   Vitals:   08/16/20 0600 08/16/20 0700 08/16/20 0800 08/16/20 0900  BP: (!) 158/89 136/69 134/69 137/64  Pulse: 69 68 69 81  Resp: 18 20 19 16   Temp:   98.4 F (36.9 C)   TempSrc:   Axillary   SpO2: 96% 95% 96% 98%   CBC:  Recent Labs  Lab 08/14/20 0340 08/15/20 0536 08/16/20 0416  WBC 10.4 12.6* 11.1*  NEUTROABS 7.6 9.4*  --   HGB 12.6 11.0* 10.5*  HCT 37.1 32.8* 31.9*  MCV 85.7 87.2 87.2  PLT 307 277 276   Basic Metabolic Panel:  Recent Labs  Lab 08/15/20 0536 08/16/20 0416  NA 140 140  K 2.8* 3.3*  CL 109 109  CO2 21* 22  GLUCOSE 97 134*  BUN 6 9  CREATININE 0.53 0.55  CALCIUM 8.3* 8.7*   Lipid Panel:  Recent Labs  Lab 08/12/20 1628  CHOL 253*  TRIG 217*  HDL 38*  CHOLHDL 6.7  VLDL 43*  LDLCALC 172*   HgbA1c:  Recent Labs  Lab 08/12/20 1628  HGBA1C 6.2*   Urine Drug Screen:  Recent Labs  Lab 08/12/20 1300  LABOPIA NONE DETECTED  COCAINSCRNUR NONE DETECTED  LABBENZ NONE DETECTED  AMPHETMU NONE DETECTED  THCU NONE DETECTED  LABBARB NONE DETECTED    Alcohol Level  Recent Labs  Lab 08/12/20 1148  ETH <10    IMAGING past 24 hours No results found.  PHYSICAL EXAM  Temp:  [97.8 F (36.6 C)-99.2 F (37.3 C)] 98.4 F (36.9 C) (08/11 0800) Pulse Rate:  [52-81] 81 (08/11 0900) Resp:  [13-25] 16 (08/11 0900) BP: (115-159)/(58-135) 137/64 (08/11 0900) SpO2:  [94 %-99 %] 98 % (08/11 0900)  General - Well nourished, well developed, in no apparent distress.  Ophthalmologic - fundi not visualized due to noncooperation.  Cardiovascular - Regular rhythm and rate.  Mental Status -  Sleepy but easily arousable. Orientatation to time, place, and  person were intact. Language including expression, naming, repetition, comprehension was assessed and found intact.  Cranial Nerves II - XII - II - Visual field intact OU. III, IV, VI - Extraocular movements intact. V - Facial sensation intact bilaterally. VII - facial symmetrical VIII - Hearing & vestibular intact bilaterally. X - Palate elevates symmetrically. XI - Chin turning & shoulder shrug intact bilaterally. XII - Tongue protrusion intact.  Motor Strength - The patient's strength was normal in all extremities. Bulk was normal and fasciculations were absent.   Motor Tone - Muscle tone was assessed at the neck and appendages and was normal.  Reflexes - The patient's reflexes were symmetrical in all extremities and she had no pathological reflexes.  Sensory - Light touch, temperature/pinprick were assessed and showed left upper extremity sensory loss, and left lower extremities decreased light touch. Left sensory neglect  Coordination - The patient had normal movements in the right hand with no ataxia or dysmetria.  However, left FTN slow but not ataxic anymore. Tremor was absent.  Gait and Station - deferred.   ASSESSMENT/PLAN [name] is a 55 y.o. female with history of HTN, migraine presenting with left facial droop and slurry speech.   Stroke:  right MCA infarct due to right ICA occlusion s/p tPA complicated with small SAH and later right ICA stenting, etiology unclear CT head code stroke Acute infarct in the right insula and frontal operculum, ASPECTS is 8. CTA head and neck - right ICA occlusion and right M1/M2 non-occlusive clot vs. High grade stenosis Serial CT Head stable small SAH right frontal. MRI SWI only - Positive for small volume subarachnoid hemorrhage. MRI: repeat: right MCA multifocal moderate sized infarcts, stable trace SAH right frontal.  MRA  occlusion of right ICA reconstitution at ophthalmic segment. Occlusion of right M3.  Left VA  occlusion, age indeterminate 8/9 s/p right ICA revascularization and stent 8/10 CUS right ICA stent patent 2D Echo EF 60-65% Plan for TEE tomorrow  May consider 30 day cardiac event monitoring as outpt LDL 172 HgbA1c 6.2 Hypercoagulable labs pending VTE prophylaxis - lovenox No antithrombotic prior to admission, now on ASA 81 and Brilinta in after right ICA revascularization and stenting Therapy recommendations:  CIR Disposition:  pending  Trace right frontal SAH post tPA Fibrinogen level 248 Serial CT stable SAH No reversal needed MRI showed trace SAH right frontal, unchanged  Right ICA occlusion Discussing with Dr. Corliss Skains  S/p revascularization and stenting of RICA 8/9 On aspirin and brilinta   Right femoral artery pseudoaneurysm Post op right groin hematoma Doppler showed small 2.3 x 2 pseudoaneurysm Vascular surgery consulted -> s/p repair Doing well, off the bed  Orthostatic hypotension Patient new or worsening after sitting up at bedside commode, BP down to 80s, put back in bed, symptoms improving 1 L normal saline bolus stat 8/8 Albumin every 6 hours for 4 doses 8/8 Avoid low BP Stable now Orthostatic vital pending  Hypertension Home meds:  zestoretic Stable BP goal 120-160 s/p right ICA revasc Long-term BP goal normotensive  Hyperlipidemia Home meds:  none LDL 172, goal < 70 Add atorvastatin 80 mg daily  Continue statin at discharge  Other Stroke Risk Factors Former cigarette smoker, advised to stay a non-smoker ETOH use, alcohol level <10, advised to drink no more than 1 drink a day Migraines Probable OSA - need outpt follow up   Hospital day # 4  This patient is critically ill due to right MCA stroke, right ICA occlusion s/p stenting, right femoral artery pseudoaneuysm s/p repair, orthostatic hypotension and at significant risk of neurological worsening, death form recurrent stroke, stent occlusion, femoral artery aneurysm, seizure, syncope.  This patient's care requires constant monitoring of vital signs, hemodynamics, respiratory and cardiac monitoring, review of multiple databases, neurological assessment, discussion with family, other specialists and medical decision making of high complexity. I spent 45 minutes of neurocritical care time in the care of this patient. I had long discussion with husband and son at bedside, updated pt current condition, treatment plan and potential prognosis, and answered all the questions. They expressed understanding and appreciation.    Marvel Plan, MD PhD Stroke Neurology 08/16/2020 11:00 AM  To contact Stroke Continuity provider, please refer to [email or web address]. After hours, contact General Neurology

## 2020-08-16 NOTE — Progress Notes (Signed)
Physical Therapy Treatment Patient Details Name: Vicki Gonzales MRN: [id number] DOB: 1965/09/04 Today's Date: 08/16/2020    History of Present Illness Pt is a 55 y.o. F who presents with dysarthric speech and left facial droop. Found to have a developing R MCA stroke with an ASPECTS of 8 and a R MCA bifurcation subocclusive thrombus vs stenosis. Received tPA. She reported headache on arrival to [facility] and MRI SWI sequences confirmed a small volume R frontal SAH. Thrombectomy was not pursued and tPA was not reversed immediately due to concern for progression of R MCA stroke. Pt developed Rt femoral  pseudoaneurysm8/11  and underwent repair  Significant PMH: HTN, migraines.     PT Comments    The pt was eager to mobilize with PT despite reports of continued fatigue this morning. The pt was able to complete bed mobility with minA of 2 and use of bed rails at this time, but required modA of 2 to complete power up to standing and to steady in standing position. The pt presents with strong L lateral lean, decreased proprioception and postural awareness, and ataxia that limits her ability to safely complete OOB transfers without significant assist of 2. Will continue to benefit from skilled PT acutely to progress strength and stability, continue to recommend CIR level therapies at d/c.    Follow Up Recommendations  CIR     Equipment Recommendations  Other (comment) (defer to post acute)    Recommendations for Other Services       Precautions / Restrictions Precautions Precautions: Fall;Other (comment) Precaution Comments: L inattention, hemiplegia, SBP < 140 Restrictions Weight Bearing Restrictions: No    Mobility  Bed Mobility Overal bed mobility: Needs Assistance Bed Mobility: Supine to Sit     Supine to sit: Min assist;+2 for safety/equipment;+2 for physical assistance;HOB elevated     General bed mobility comments: pt required cues for problem solving, sequencing  and safety.  She requires assist to lift trunk and scoot hips to EOB.    Transfers Overall transfer level: Needs assistance Equipment used: 2 person hand held assist Transfers: Sit to/from [employer] Sit to Stand: Mod assist;+2 physical assistance Stand pivot transfers: Max assist;Mod assist;+2 physical assistance       General transfer comment: pt performed initial stand with modA of 2 with assist to maintain L knee position. max cues and facilitation to generate wt shift to correct strong L lateral lean. maxA to pivot to R initially, pt progressed to modA within session x3 completed squat pivot transfers  Ambulation/Gait             General Gait Details: pt unable to generate steps at this time   Stairs             Wheelchair Mobility    Modified Rankin (Stroke Patients Only) Modified Rankin (Stroke Patients Only) Pre-Morbid Rankin Score: No symptoms Modified Rankin: Severe disability     Balance Overall balance assessment: Needs assistance Sitting-balance support: Feet supported;Single extremity supported Sitting balance-Leahy Scale: Poor Sitting balance - Comments: reliant on UE support and up to min A due to posterior lean and being easily distracted   Standing balance support: Bilateral upper extremity supported Standing balance-Leahy Scale: Poor Standing balance comment: requires UE support - pt with heavy Lt lateral lean                            Cognition Arousal/Alertness: Awake/alert Behavior During Therapy:  Flat affect;Impulsive;Restless Overall Cognitive Status: Impaired/Different from baseline Area of Impairment: Attention;Following commands;Safety/judgement;Awareness;Problem solving                   Current Attention Level: Sustained   Following Commands: Follows one step commands consistently;Follows multi-step commands inconsistently Safety/Judgement: Decreased awareness of safety;Decreased awareness  of deficits Awareness: Intellectual Problem Solving: Difficulty sequencing;Requires verbal cues;Requires tactile cues General Comments: Pt easily distracted requiring up to mod cues to sustain attention.   she demonstrates decreased awareness of deficits, and requires mod cues for sequencing and problem solving during transfers.  Pt noted to be impulisve      Exercises      General Comments General comments (skin integrity, edema, etc.): son and spouse present during session, very encouraging, but some anxiety noted regarding pt's condition      Pertinent Vitals/Pain Pain Assessment: No/denies pain     PT Goals (current goals can now be found in the care plan section) Acute Rehab PT Goals Patient Stated Goal: to take a nap PT Goal Formulation: With patient Time For Goal Achievement: 08/27/20 Potential to Achieve Goals: Good Progress towards PT goals: Progressing toward goals    Frequency    Min 4X/week      PT Plan Current plan remains appropriate    Co-evaluation PT/OT/SLP Co-Evaluation/Treatment: Yes Reason for Co-Treatment: Necessary to address cognition/behavior during functional activity;For patient/therapist safety;To address functional/ADL transfers PT goals addressed during session: Mobility/safety with mobility;Balance;Proper use of DME;Strengthening/ROM        AM-PAC PT "6 Clicks" Mobility   Outcome Measure  Help needed turning from your back to your side while in a flat bed without using bedrails?: A Little Help needed moving from lying on your back to sitting on the side of a flat bed without using bedrails?: A Lot Help needed moving to and from a bed to a chair (including a wheelchair)?: Total Help needed standing up from a chair using your arms (e.g., wheelchair or bedside chair)?: Total Help needed to walk in hospital room?: Total Help needed climbing 3-5 steps with a railing? : Total 6 Click Score: 9    End of Session Equipment Utilized During  Treatment: Gait belt Activity Tolerance: Patient tolerated treatment well;Patient limited by fatigue Patient left: in chair;with call bell/phone within reach;with chair alarm set;with family/visitor present Nurse Communication: Mobility status PT Visit Diagnosis: Unsteadiness on feet (R26.81);Difficulty in walking, not elsewhere classified (R26.2);Hemiplegia and hemiparesis Hemiplegia - Right/Left: Left Hemiplegia - dominant/non-dominant: Non-dominant Hemiplegia - caused by: Cerebral infarction;Nontraumatic SAH     Time: 7412-8786 PT Time Calculation (min) (ACUTE ONLY): 31 min  Charges:  $Therapeutic Activity: 8-22 mins                     Lazarus Gowda, PT, DPT   Acute Rehabilitation Department Pager #: [phone]   Vicki Gonzales 08/16/2020, 5:04 PM

## 2020-08-16 NOTE — Progress Notes (Signed)
    Subjective  - POD #1, s/p repainr of right femoral pseudoaneurysm  C/o right groin pain   Physical Exam:  Incision c/d/I Foot well perfused       Assessment/Plan:  POD #1  Stable from my perspective Keep groin incision clean and dry No activity incisions  Wells Ghali Morissette 08/16/2020 7:56 AM --  Vitals:   08/16/20 0600 08/16/20 0700  BP: (!) 158/89 136/69  Pulse: 69 68  Resp: 18 20  Temp:    SpO2: 96% 95%    Intake/Output Summary (Last 24 hours) at 08/16/2020 0756 Last data filed at 08/16/2020 0745 Gross per 24 hour  Intake 2489.02 ml  Output 1185 ml  Net 1304.02 ml     Laboratory CBC    Component Value Date/Time   WBC 11.1 (H) 08/16/2020 0416   HGB 10.5 (L) 08/16/2020 0416   HCT 31.9 (L) 08/16/2020 0416   PLT 276 08/16/2020 0416    BMET    Component Value Date/Time   NA 140 08/16/2020 0416   K 3.3 (L) 08/16/2020 0416   CL 109 08/16/2020 0416   CO2 22 08/16/2020 0416   GLUCOSE 134 (H) 08/16/2020 0416   BUN 9 08/16/2020 0416   CREATININE 0.55 08/16/2020 0416   CALCIUM 8.7 (L) 08/16/2020 0416   GFRNONAA >60 08/16/2020 0416    COAG Lab Results  Component Value Date   INR 1.0 08/14/2020   INR 1.0 08/12/2020   No results found for: PTT  Antibiotics Anti-infectives (From admission, onward)    Start     Dose/Rate Route Frequency Ordered Stop   08/15/20 1720  ceFAZolin (ANCEF) 2-4 GM/100ML-% IVPB       Note to Pharmacy: [facility]   : cabinet override      08/15/20 1720 08/16/20 0529   08/14/20 0800  vancomycin (VANCOCIN) IVPB 1000 mg/200 mL premix        1,000 mg 200 mL/hr over 60 Minutes Intravenous  Once 08/13/20 1551 08/14/20 0845        V. Charlena Cross, M.D., [facility] [place] Office: [phone] Pager:  [phone]

## 2020-08-16 NOTE — Progress Notes (Signed)
Occupational Therapy Treatment Patient Details Name: Vicki Gonzales MRN: [id number] DOB: October 17, 1965 Today's Date: 08/16/2020    History of present illness Pt is a 55 y.o. F who presents with dysarthric speech and left facial droop. Found to have a developing R MCA stroke with an ASPECTS of 8 and a R MCA bifurcation subocclusive thrombus vs stenosis. Received tPA. She reported headache on arrival to [facility] and [facility] and MRI SWI sequences confirmed a small volume R frontal SAH. Thrombectomy was not pursued and tPA was not reversed immediately due to concern for progression of R MCA stroke. Pt developed Rt femoral  pseudoaneurysm8/11  and underwent repair  Significant PMH: HTN, migraines.   OT comments  Pt admitted with above. She demonstrates the below listed deficits and will benefit from continued OT to maximize safety and independence with BADLs.  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 She currently requires min - max A for ADLs and mod A +2 for functional transfers.  She lives with her spouse and son and was fully independent PTA.  Recommend CIR level rehab to allow pt to maximize her safety and independence with ADLs.    Follow Up Recommendations  CIR    Equipment Recommendations  None recommended by OT    Recommendations for Other Services Rehab consult    Precautions / Restrictions Precautions Precautions: Fall;Other (comment) Precaution Comments: L inattention, hemiplegia, SBP < 140       Mobility Bed Mobility Overal bed mobility: Needs Assistance Bed Mobility: Supine to Sit     Supine to sit: Min assist;+2 for safety/equipment;+2 for physical assistance;HOB elevated     General bed mobility comments: pt required cues for problem solving, sequencing and safety.  She requires assist to lift trunk  and scoot hips to EOB.    Transfers                      Balance Overall balance assessment: Needs assistance Sitting-balance support: Feet supported;Single extremity supported Sitting balance-Leahy Scale: Poor Sitting balance - Comments: reliant on UE support and up to min A due to posterior lean and being easily distracted   Standing balance support: Bilateral upper extremity supported Standing balance-Leahy Scale: Poor Standing balance comment: requires UE support - pt with heavy Lt lateral lean                           ADL either performed or assessed with clinical judgement   ADL Overall ADL's : Needs assistance/impaired Eating/Feeding: Supervision/ safety;Sitting;Bed level   Grooming: Wash/dry hands;Wash/dry face;Oral care;Brushing hair;Minimal assistance;Sitting   Upper Body Bathing: Moderate assistance;Sitting   Lower Body Bathing: Maximal assistance;Sit to/from stand   Upper Body Dressing : Moderate assistance;Sitting   Lower Body Dressing: Maximal assistance;Sit to/from stand   Toilet Transfer: Moderate assistance;+2 for physical assistance;+2 for safety/equipment;Stand-pivot;Squat-pivot;BSC Toilet Transfer Details (indicate cue type and reason): pt pushes heavily to the Lt.  and requires mod A for balance.  However, she is able to transfer toward her Rt side with min A +2 for safety.  When Lt hand placed on the armrest of the recliner, she was able to perform squat pivot transfer to her Lt from [facility] to the recliner with min A +2 for safety Toileting- Clothing Manipulation and Hygiene: Total assistance;Sit to/from stand  Functional mobility during ADLs: Moderate assistance;+2 for physical assistance;+2 for safety/equipment General ADL Comments: requires cues to attend to the Lt and cues for safety and problem solving     Vision Baseline Vision/History: No visual deficits Patient Visual Report: No change from baseline Vision Assessment?:  Yes Eye Alignment: Within Functional Limits Ocular Range of Motion: Within Functional Limits Alignment/Gaze Preference: Within Defined Limits Tracking/Visual Pursuits: Other (comment) Visual Fields: Left visual field deficit (vs. inattention) Additional Comments: Pt looses fixation frequently during visual pursuits.  She consistently misses items on the Lt when two items presented to her - one on the Rt and one on the Lt   Perception Perception Perception Tested?: Yes Perception Deficits: Inattention/neglect Inattention/Neglect: Does not attend to left visual field Spatial deficits: consistently does not attend to stimuli on her Lt when double stimuli presented on both the Lt and the Rt spontaneously   Praxis Praxis Praxis tested?: Deficits Deficits: Organization    Cognition Arousal/Alertness: Awake/alert Behavior During Therapy: Flat affect;Impulsive;Restless Overall Cognitive Status: Impaired/Different from baseline Area of Impairment: Attention;Following commands;Safety/judgement;Awareness;Problem solving                   Current Attention Level: Sustained   Following Commands: Follows one step commands consistently;Follows multi-step commands inconsistently Safety/Judgement: Decreased awareness of safety;Decreased awareness of deficits Awareness: Intellectual Problem Solving: Difficulty sequencing;Requires verbal cues;Requires tactile cues General Comments: Pt easily distracted requiring up to mod cues to sustain attention.   she demonstrates decreased awareness of deficits, and requires mod cues for sequencing and problem solving during transfers.  Pt noted to be impulisve        Exercises     Shoulder Instructions       General Comments son and spouse present during session    Pertinent Vitals/ Pain       Pain Assessment: No/denies pain  Home Living Family/patient expects to be discharged to:: Private residence Living Arrangements: Spouse/significant  other;Children Available Help at Discharge: Family;Available 24 hours/day Type of Home: House Home Access: Stairs to enter [employer] of Steps: 1   Home Layout: Two level Alternate Level Stairs-Number of Steps: 13 Alternate Level Stairs-Rails: Right;Left Bathroom Shower/Tub: Research scientist (medical): Yes How Accessible: Accessible via walker Home Equipment: Shower seat;Hand held Careers information officer - 2 wheels   Additional Comments: 1/2 bath main level      Prior Functioning/Environment Level of Independence: Independent            Frequency  Min 2X/week        Progress Toward Goals  OT Goals(current goals can now be found in the care plan section)     Acute Rehab OT Goals Patient Stated Goal: to take a nap OT Goal Formulation: With patient/family Time For Goal Achievement: 08/30/20 Potential to Achieve Goals: Good ADL Goals Pt Will Perform Grooming: with min assist;standing Pt Will Perform Upper Body Bathing: with set-up;with supervision;sitting Pt Will Perform Lower Body Bathing: with min assist;sit to/from stand Pt Will Perform Upper Body Dressing: with set-up;with supervision;sitting Pt Will Perform Lower Body Dressing: with min assist;sit to/from stand Pt Will Transfer to Toilet: with min assist;regular height toilet;ambulating;bedside commode;grab bars Additional ADL Goal #1: Pt will susatin attention to familiar ADL task x 5 mins with min cues Additional ADL Goal #2: Pt will locate all needed ADL items on her Lt with no more than min cues  Plan      Co-evaluation    PT/OT/SLP Co-Evaluation/Treatment: Yes Reason for Co-Treatment:  To address functional/ADL transfers;For patient/therapist safety;Necessary to address cognition/behavior during functional activity          AM-PAC OT "6 Clicks" Daily Activity     Outcome Measure   Help from another person eating meals?: None Help from another person taking care of  personal grooming?: A Little Help from another person toileting, which includes using toliet, bedpan, or urinal?: A Lot Help from another person bathing (including washing, rinsing, drying)?: A Lot Help from another person to put on and taking off regular upper body clothing?: A Lot Help from another person to put on and taking off regular lower body clothing?: A Lot 6 Click Score: 15    End of Session Equipment Utilized During Treatment: Gait belt  OT Visit Diagnosis: Unsteadiness on feet (R26.81);Cognitive communication deficit (R41.841) Symptoms and signs involving cognitive functions: Cerebral infarction   Activity Tolerance Patient tolerated treatment well   Patient Left in chair;with call bell/phone within reach;with chair alarm set;with family/visitor present   Nurse Communication Mobility status        Time: 9509-3267 OT Time Calculation (min): 32 min  Charges: OT General Charges $OT Visit: 1 Visit OT Evaluation $OT Eval Moderate Complexity: 1 Mod  [name]., OTR/L Acute Rehabilitation Services Pager [phone] Office [phone]    [name] 08/16/2020, 3:18 PM

## 2020-08-16 NOTE — Progress Notes (Signed)
SLP Cancellation Note  Patient Details Name: Erick Oxendine MRN: [id number] DOB: [birth year]   Cancelled treatment:        Currently on bedside commode. Will continue efforts.   Royce Macadamia 08/16/2020, 9:58 AM  Breck Coons Lonell Face.Ed Nurse, children's [phone] Office [phone]

## 2020-08-16 NOTE — Progress Notes (Signed)
  Shared Decision Making/Informed Consent  The risks [esophageal damage, perforation (1:10,000 risk), bleeding, pharyngeal hematoma as well as other potential complications associated with conscious sedation including aspiration, arrhythmia, respiratory failure and death], benefits (treatment guidance and diagnostic support) and alternatives of a transesophageal echocardiogram were discussed in detail with Vicki Gonzales and she is willing to proceed.   TEE scheduled for tomrrow at 11 AM, NPO after midnight.

## 2020-08-16 NOTE — Progress Notes (Signed)
Referring Physician(s):Dr. Jerel Shepherd.   Supervising Physician: Julieanne Cotton  Patient Status:  [facility] - In-pt  Chief Complaint:   Right MCA infarct due to right ICA occlusion s/p four vessel cerebral arteriogram with revascularization of occluded proximal right ICA via stent-assisted angioplasty 08/14/20; case complicated by right CFA pseudoaneurysm development s/p repair by vascular surgery on 8/10    Subjective:  Pt laying ion bed, not in acute distress. Husband at bedside.  States right groin still hurts.  Husband states persistent left side weakness.   Allergies: Keflex [cephalexin]  Medications: Prior to Admission medications   Medication Sig Start Date End Date Taking? Authorizing Provider  acetaminophen (TYLENOL) 500 MG tablet Take 500 mg by mouth every 6 (six) hours as needed for mild pain, fever or headache.   Yes [provider]  levothyroxine (SYNTHROID) 50 MCG tablet Take 50 mcg by mouth every morning. 05/10/20  Yes [provider]  lisinopril-hydrochlorothiazide (ZESTORETIC) 20-12.5 MG tablet Take 1 tablet by mouth daily. 06/18/20  Yes [provider]  PARoxetine (PAXIL) 10 MG tablet Take 10 mg by mouth daily. 05/04/20  Yes [provider]     Vital Signs: BP 134/69   Pulse 69   Temp 98.4 F (36.9 C) (Axillary)   Resp 19   LMP 04/07/2019 (Approximate)   SpO2 96%   Physical Exam Vitals reviewed.  Constitutional:      General: She is not in acute distress.    Appearance: She is not ill-appearing.  HENT:     Head: Normocephalic and atraumatic.  Pulmonary:     Effort: Pulmonary effort is normal.  Abdominal:     General: Abdomen is flat.     Palpations: Abdomen is soft.  Musculoskeletal:     Cervical back: Neck supple.  Skin:    General: Skin is warm and dry.     Coloration: Skin is not jaundiced or pale.     Findings: Bruising present.     Comments: Positive sutures on right CFA puncture site with a gauze on top.  Ecchymosis noted at right hip, approximately 2 inches superior and 2 inches lateral to the puncture site. Mild TTP. Site is otherwise unremarkable with no erythema, edema, bleeding or drainage.   Neurological:     Mental Status: She is alert and oriented to person, place, and time.     Comments: Alert, awake, and oriented x 4 Speech and comprehension intact PERRL  EOMs intact No  nystagmus or subjective diplopia. No facial asymmetry,  no sensory deficit on face  Motor power intact, left weaker than right  Sensory deficit on left arm and left leg  No pronator drift Fine motor and coordination intact on right, slower in left Right DP 1+    Psychiatric:        Mood and Affect: Mood normal.        Behavior: Behavior normal.    Imaging: CT HEAD WO CONTRAST ( )  Result Date: 08/12/2020 CLINICAL DATA:  Cerebral hemorrhage suspected EXAM: CT HEAD WITHOUT CONTRAST TECHNIQUE: Contiguous axial images were obtained from the base of the skull through the vertex without intravenous contrast. COMPARISON:  None. FINDINGS: Brain: There is an early subacute infarct of the right frontal operculum and insula. Faint subarachnoid hemorrhage over the right frontal lobe is unchanged. There is no new site of hemorrhage. No midline shift or other mass effect. Vascular: No abnormal hyperdensity of the major intracranial arteries or dural venous sinuses. No intracranial atherosclerosis. Skull: The visualized  skull base, calvarium and extracranial soft tissues are normal. Sinuses/Orbits: No fluid levels or advanced mucosal thickening of the visualized paranasal sinuses. No mastoid or middle ear effusion. The orbits are normal. IMPRESSION: 1. Unchanged appearance of early subacute infarct of the right frontal operculum and insula. 2. Unchanged faint subarachnoid hemorrhage over the right frontal lobe. 3. No new site of hemorrhage. Electronically Signed   By: Deatra Robinson M.D.   On: 08/12/2020 23:38   CT HEAD WO CONTRAST  ( )  Result Date: 08/12/2020 CLINICAL DATA:  Follow-up subarachnoid hemorrhage EXAM: CT HEAD WITHOUT CONTRAST TECHNIQUE: Contiguous axial images were obtained from the base of the skull through the vertex without intravenous contrast. COMPARISON:  08/12/2020 FINDINGS: Brain: There appears to continue to be a small amount of subarachnoid hemorrhage in the right frontal region, unchanged since prior study. No new areas of hemorrhage. No hydrocephalus or acute infarction. Vascular: No hyperdense vessel or unexpected calcification. Skull: No acute calvarial abnormality. Sinuses/Orbits: No acute findings Other: None IMPRESSION: Stable minimal subarachnoid hemorrhage in the right frontal region, unchanged. Electronically Signed   By: Charlett Nose M.D.   On: 08/12/2020 18:49   CT HEAD WO CONTRAST ( )  Result Date: 08/12/2020 CLINICAL DATA:  Cerebral hemorrhage suspected. Follow-up hemorrhage status post tPA. EXAM: CT HEAD WITHOUT CONTRAST TECHNIQUE: Contiguous axial images were obtained from the base of the skull through the vertex without intravenous contrast. COMPARISON:  Head CT and MRI earlier today. FINDINGS: Brain: Minimal right frontal subarachnoid hemorrhage is unchanged in extent from the earlier CT though is slightly less conspicuous (potentially related to different scanners). No new intracranial hemorrhage, extra-axial fluid collection, mass, or midline shift is identified. Cytotoxic edema related to an acute right MCA infarct in the insula and frontal operculum is unchanged. The ventricles are normal in size. Vascular: Residual intravascular contrast material. Skull: No fracture or suspicious osseous lesion. Sinuses/Orbits: Minimal mucosal thickening in the right maxillary sinus. Clear mastoid air cells. Unremarkable orbits. Other: None. IMPRESSION: 1. Unchanged trace subarachnoid hemorrhage. 2. Unchanged acute right MCA infarct. Electronically Signed   By: Sebastian Ache M.D.   On: 08/12/2020 15:56    CT HEAD WO CONTRAST ( )  Result Date: 08/12/2020 CLINICAL DATA:  Cerebral hemorrhage suspected. Headache following tPA. EXAM: CT HEAD WITHOUT CONTRAST TECHNIQUE: Contiguous axial images were obtained from the base of the skull through the vertex without intravenous contrast. COMPARISON:  Head CT earlier today FINDINGS: Brain: Hypodensity in the right insula and right frontal operculum is similar in extent to today's earlier CT and consistent with an acute infarct. There is a small amount of scattered curvilinear hyperdensity in the right frontal lobe which is indeterminate for trace subarachnoid hemorrhage versus cortical contrast staining or cortical petechial hemorrhage. The ventricles are normal in size. No mass, midline shift, or extra-axial fluid collection is identified. Vascular: Residual intravascular contrast from earlier CTA. Skull: No fracture or suspicious osseous lesion. Sinuses/Orbits: Visualized paranasal sinuses and mastoid air cells are clear. Unremarkable orbits. Other: None. IMPRESSION: 1. New small superficial densities in the right frontal lobe, indeterminate for trace subarachnoid hemorrhage versus cortical contrast staining or cortical petechial hemorrhage. 2. Unchanged extent of acute right MCA infarct involving the insula and frontal operculum. The study was discussed by telephone with Dr. Erick Blinks on 08/12/2020 at 2:15 p.m. Electronically Signed   By: Sebastian Ache M.D.   On: 08/12/2020 14:42   MR ANGIO HEAD WO CONTRAST  Result Date: 08/13/2020 CLINICAL DATA:  Neuro deficit, acute, stroke suspected.  EXAM: MRI HEAD WITHOUT CONTRAST MRA HEAD WITHOUT CONTRAST TECHNIQUE: Multiplanar, multi-echo pulse sequences of the brain and surrounding structures were acquired without intravenous contrast. Angiographic images of the Circle of Willis were acquired using MRA technique without intravenous contrast. COMPARISON:  Head CT and head and neck CTA August 12, 2020. FINDINGS: MRI HEAD  FINDINGS Brain: Area of restricted diffusion involving the right perirolandic region, corresponding to acute infarct, new from prior CT. Area of restricted diffusion involving the right frontal operculum and insula and focus of restricted diffusion in the right posterior temporal lobe, corresponding to acute/subacute infarcts, seen on prior CT. Foci of susceptibility artifact are seen in the right frontal lobe, parietal lobe, insula and temporal lobe, within the areas of restricted diffusion, consistent with petechial hemorrhage. There is also susceptibility artifact in right frontal high convexity sulci, corresponding to subarachnoid hemorrhage seen on prior studies. Scattered foci of T2 hyperintensity are seen within the white matter of the cerebral hemispheres and within the pons, nonspecific, most likely related to chronic small vessel ischemia. Vascular: Normal flow voids. Skull and upper cervical spine: Normal marrow signal. Sinuses/Orbits: Trace mucosal thickening throughout the paranasal sinuses and small mucous retention cyst in the right maxillary sinus. The orbits are maintained. Other: None. MRA HEAD FINDINGS Anterior circulation: Absent flow related enhancement in the upper cervical and intracranial right internal carotid artery with reconstitution of flow at the ophthalmic segment with diminished caliber. Normal caliber and flow related enhancement of the upper cervical and intracranial left ICA. Normal flow related enhancement of the right M1/MCA segment with occlusion of a right M3/MCA anterior division branch. The bilateral ACA and left MCA vascular trees are preserved. Posterior circulation: Absent flow related enhancement in the proximal left vertebral artery with normal flow distal to the PICA origin. The dominant right vertebral artery has normal course and caliber. The basilar artery and bilateral posterior cerebral arteries are unremarkable. Anatomic variants: None significant. IMPRESSION: 1.  Multifocal areas of right MCA territory infarct including a new area in the right perirolandic region when compared to prior CT. 2. Few small foci of susceptibility artifact within the areas of restricted diffusion corresponding to petechial hemorrhage. 3. Occlusion of the intracranial right ICA with reconstitution at the ophthalmic segment. 4. Occlusion of a right M3/MCA anterior division branch. 5. Stable trace right subarachnoid hemorrhage. 6. Moderate chronic white matter disease. These results were called by telephone at the time of interpretation on 08/13/2020 at 1:17 pm to Dr. Marvel Plan , who verbally acknowledged these results. Electronically Signed   By: Baldemar Lenis M.D.   On: 08/13/2020 13:18   MR BRAIN WO CONTRAST  Result Date: 08/13/2020 CLINICAL DATA:  Neuro deficit, acute, stroke suspected. EXAM: MRI HEAD WITHOUT CONTRAST MRA HEAD WITHOUT CONTRAST TECHNIQUE: Multiplanar, multi-echo pulse sequences of the brain and surrounding structures were acquired without intravenous contrast. Angiographic images of the Circle of Willis were acquired using MRA technique without intravenous contrast. COMPARISON:  Head CT and head and neck CTA August 12, 2020. FINDINGS: MRI HEAD FINDINGS Brain: Area of restricted diffusion involving the right perirolandic region, corresponding to acute infarct, new from prior CT. Area of restricted diffusion involving the right frontal operculum and insula and focus of restricted diffusion in the right posterior temporal lobe, corresponding to acute/subacute infarcts, seen on prior CT. Foci of susceptibility artifact are seen in the right frontal lobe, parietal lobe, insula and temporal lobe, within the areas of restricted diffusion, consistent with petechial hemorrhage. There is also  susceptibility artifact in right frontal high convexity sulci, corresponding to subarachnoid hemorrhage seen on prior studies. Scattered foci of T2 hyperintensity are seen within the  white matter of the cerebral hemispheres and within the pons, nonspecific, most likely related to chronic small vessel ischemia. Vascular: Normal flow voids. Skull and upper cervical spine: Normal marrow signal. Sinuses/Orbits: Trace mucosal thickening throughout the paranasal sinuses and small mucous retention cyst in the right maxillary sinus. The orbits are maintained. Other: None. MRA HEAD FINDINGS Anterior circulation: Absent flow related enhancement in the upper cervical and intracranial right internal carotid artery with reconstitution of flow at the ophthalmic segment with diminished caliber. Normal caliber and flow related enhancement of the upper cervical and intracranial left ICA. Normal flow related enhancement of the right M1/MCA segment with occlusion of a right M3/MCA anterior division branch. The bilateral ACA and left MCA vascular trees are preserved. Posterior circulation: Absent flow related enhancement in the proximal left vertebral artery with normal flow distal to the PICA origin. The dominant right vertebral artery has normal course and caliber. The basilar artery and bilateral posterior cerebral arteries are unremarkable. Anatomic variants: None significant. IMPRESSION: 1. Multifocal areas of right MCA territory infarct including a new area in the right perirolandic region when compared to prior CT. 2. Few small foci of susceptibility artifact within the areas of restricted diffusion corresponding to petechial hemorrhage. 3. Occlusion of the intracranial right ICA with reconstitution at the ophthalmic segment. 4. Occlusion of a right M3/MCA anterior division branch. 5. Stable trace right subarachnoid hemorrhage. 6. Moderate chronic white matter disease. These results were called by telephone at the time of interpretation on 08/13/2020 at 1:17 pm to Dr. Marvel Plan , who verbally acknowledged these results. Electronically Signed   By: Baldemar Lenis M.D.   On: 08/13/2020 13:18    MR BRAIN WO CONTRAST  Result Date: 08/12/2020 CLINICAL DATA:  Brain mass or lesion. Code stroke. Trace subarachnoid hemorrhage versus cortical contrast staining or cortical petechial hemorrhage on CT following tPA. EXAM: MRI HEAD WITHOUT CONTRAST TECHNIQUE: Multiplanar, multiecho pulse sequences of the brain and surrounding structures were obtained without intravenous contrast. COMPARISON:  Head CT 08/12/2020 FINDINGS: A limited study consisting of only axial susceptibility weighted imaging was performed at the direction of the stroke neurologist. There is mild-to-moderate motion artifact, however curvilinear foci of susceptibility artifact are identified in several right frontal sulci most consistent with small volume acute subarachnoid hemorrhage when correlating with the preceding CT. There may also be minimal subarachnoid hemorrhage in the right parietal region. IMPRESSION: Positive for small volume subarachnoid hemorrhage. These results were called by telephone at the time of interpretation on 08/12/2020 at 2:53 p.m. to Dr. Erick Blinks, who verbally acknowledged these results. Electronically Signed   By: Sebastian Ache M.D.   On: 08/12/2020 15:03   IR INTRAVSC STENT CERV CAROTID W/O EMB-PROT MOD SED  Result Date: 08/16/2020 CLINICAL DATA:  Right cerebral hemisphere ischemic stroke secondary to acute occlusion of the right internal carotid artery proximally with delayed string sign initially. EXAM: INTRACRANIAL STENT (INCL PTA) COMPARISON:  CT angiogram of the head and neck of August 12, 2020, and MRI MRA of the brain of August 13, 2020. MEDICATIONS: Heparin 3,500 units IV. Vancomycin antibiotic was administered within 1 hour of the procedure. ANESTHESIA/SEDATION: General anesthesia. CONTRAST:  Omnipaque 300 approximately 180 mL. FLUOROSCOPY TIME:  Fluoroscopy Time: 62 minutes 30 seconds (2136 mGy). COMPLICATIONS: None immediate. TECHNIQUE: Informed written consent was obtained from the patient after  a  thorough discussion of the procedural risks, benefits and alternatives. All questions were addressed. Maximal Sterile Barrier Technique was utilized including caps, mask, sterile gowns, sterile gloves, sterile drape, hand hygiene and skin antiseptic. A timeout was performed prior to the initiation of the procedure. The right groin was prepped and draped in the usual sterile fashion. Thereafter using modified Seldinger technique, transfemoral access into the right common femoral artery was obtained without difficulty. Over a 0.035 inch guidewire, an 8 [place] Pinnacle 25 cm sheath was inserted. Through this, and also over 0.035 inch guidewire, a 5 [place] JB 1 catheter was advanced to the aortic arch region and selectively positioned in the right common carotid artery, the right vertebral artery, the left common carotid artery and the left vertebral artery. FINDINGS: The left subclavian arteriogram demonstrates nonvisualization of the left vertebral artery. No distal reconstitution of the vessel is seen from branches arising from the ascending cervical branch of the thyrocervical trunk. Left common carotid arteriogram demonstrates the left external carotid artery and its major branches to be widely patent. The left internal carotid artery at the bulb to the cranial skull base is widely patent. The petrous, cavernous, and supraclinoid segments are patent as well. The left middle cerebral artery and the left anterior cerebral artery opacify into the capillary and venous phases. Prompt cross-filling via the anterior communicating artery of the right anterior cerebral A1 segment, and partial right middle cerebral artery distribution is noted with evidence of occlusion of the anterior perisylvian branches of the superior division of the right middle cerebral artery. The right vertebral artery origin is widely patent. The vessel is seen to opacify to the cranial skull base to the right posterior-inferior cerebellar artery  and the right vertebrobasilar junction. The basilar artery, the posterior cerebral arteries, the superior cerebellar arteries and the anterior-inferior cerebellar arteries opacify into the capillary and venous phases. Collaterals arising from the right PCA P3 segment are seen to supply the right posterior parietal, cortical and subcortical region. Also demonstrated is retrograde opacification of the left vertebrobasilar junction to the left posterior-inferior cerebellar artery. No evidence of antegrade flow from the proximal left vertebral artery is seen. Right common carotid arteriogram demonstrates the right external carotid artery and its major branches to be widely patent. Partial reconstitution of the right internal carotid artery at the cavernous segment is seen via the ethmoidal collaterals retrogradely opacifying the right ophthalmic artery. Delayed DSA demonstrates retrograde opacification of the right internal carotid artery to the cervical petrous junction. Partial opacification is seen of the right middle cerebral artery proximally in its branches. Unopacified blood is seen from the contralateral left internal carotid artery via the anterior communicating artery. ENDOVASCULAR REVASCULARIZATION OF OCCLUDED RIGHT INTERNAL CAROTID ARTERY PROXIMALLY Over a 0.035 inch 300 cm Rosen exchange guidewire, an 087 Walrus balloon guide catheter which was prepped with 50% contrast and 50% heparinized saline infusion was advanced and positioned just proximal to the right common carotid bifurcation. The guidewire was removed. Good aspiration obtained from the hub of the balloon guide catheter. A control arteriogram performed through the balloon guide catheter demonstrated near complete occlusion of the right internal carotid artery distally with a proximal very faint string sign. Over an 014 inch standard Synchro micro guidewire with a moderate J configuration an 021 150 cm microcatheter was advanced distal to the  balloon guide catheter. Using a torque device, multiple attempts were made to advance the micro guidewire through the severely narrowed paper thin string sign without success. Multiple  attempts were then made using a different 0.014 inch Aristotle micro guidewire without success. A 125 cm 4 French slip catheter was then advanced with the balloon guide catheter and positioned pointing into the occluded right internal carotid artery. Through this an 035 inch Roadrunner guidewire was then gently advanced with minimal resistance through the occluded proximal right internal carotid artery into the distal cervical petrous junction. The 4 French slip catheter was advanced to the mid cervical right ICA. Good aspiration of blood was obtained from the hub of the slip catheter. Very gentle contrast injection demonstrated antegrade flow into the distal right internal carotid artery. The 4 French slip catheter was then exchanged for an 014 inch 300 cm Zoom exchange micro guidewire with a moderate J-tip configuration. The distal end of the exchange micro guidewire was maintained in the horizontal petrous segment. A gentle control arteriogram performed through the balloon guide catheter demonstrated antegrade flow through the proximal right internal carotid artery more distally. A 15 mm smooth filling defect was now evident at the proximal right internal carotid artery. A 4 mm x 30 mm Viatrac 14 angioplasty balloon catheter was then prepped antegradely and retrogradely and then advanced using the rapid exchange technique to the distal end of the balloon guide catheter. Distal and proximal markers were positioned adequate distant from the severe ICA stenosis. A control angioplasty was then performed using micro inflation syringe device via micro tubing with balloon being expanded to just over 8 atmospheres where it was maintained for approximately 30 seconds. The balloon was then deflated and retrieved and removed. Control  arteriogram performed through the guide catheter now demonstrated significant improved caliber and flow through the angioplastied segment and distally into the right internal carotid artery extra cranially and intracranially. The previously known occluded to anterior perisylvian branches of the superior division demonstrated string like flow in the M2 M3 region. Also noted was an occluded distal M3 branch of a parietal branch of the inferior division of the left middle cerebral artery. Following the angioplasty the proximal right internal carotid artery demonstrated smooth contour of the angioplastied segment most likely representing the phased atherosclerotic plaques following the angioplasty. No true intraluminal filling defects were seen. Measurements were then performed of the left internal carotid artery in its most normal segment distally, and proximally the left common carotid artery. It was decided to proceed with placement of a 6/8 mm x 40 mm Xact stent. The stent delivery apparatus was retrogradely purged with heparinized saline infusion. Thereafter using the rapid exchange technique, the stent delivery system was then advanced without difficulty and positioned adequate distance from the site of the angioplastied segment of the right internal carotid proximally. The stent was deployed in the usual manner without difficulty. Proximal flow arrest was initiated by inflating the balloon at the distal end of the balloon guide catheter prior to the angioplasty, and at the time of the advancement of the stent with aspiration at the hub of the balloon guide catheter. Following the placement of the stent, balloon was deflated and aspiration discontinued. With the exchange distal micro guidewire and in the horizontal petrous segment, delivery apparatus was retrieved and removed. A control arteriogram performed through the guide catheter now demonstrated excellent apposition and improved flow through the previously  occluded right internal carotid proximally and distally intracranially. Again no evidence of new intraluminal filling defects or occlusion was seen intracranially. Significant spasm was seen just distal to the distal part of the stent. At least 4 aliquots of 25  mcg of nitroglycerin were then infused in divided doses with only temporary relief of the spasm. This also demonstrated caliber irregularity of the mid right internal carotid artery. Distal to this the artery remained smooth in caliber. Patient was also given 2.5 mg of verapamil intra-arterially with only partial temporary relief of the spasm. It was, therefore, decided to proceed with placement of a 4.5 mm x 30 mm Neuroform Atlas stent telescoping into the distal segment of the first stent. Over the exchange micro guidewire, an 021 microcatheter was advanced into the horizontal petrous segment. The guidewire was removed. Good aspiration obtained from the hub of the microcatheter. Gentle control arteriogram performed through microcatheter demonstrated safe position of the tip of the microcatheter. This was then connected to continuous heparinized saline infusion. The 4.5 mm x 30 mm Neuroform Atlas stent was then advanced to the distal end of the microcatheter. The entire system was then retrieved more proximally such that the proximal portion of the Neuroform stent was just telescoping into the distal portion of the initial stent. Thereafter, the O ring on the delivery micro guidewire was loosened. With slight forward gentle traction with the right hand on the delivery micro guidewire, with the left hand the distal, and then the proximal portion of the stent were deployed with the proximal end telescoping just inside of the first stent. The delivery micro guidewire was removed with the microcatheter. Gentle control arteriogram performed through the balloon guide catheter in the right common carotid artery now demonstrated excellent flow throughout the  entirety of the right internal carotid artery intracranially and extracranially with no change angiographically. 4.5 mg of intra-arterial Integrilin were also infused in order to prevent platelet aggregation within the stents. None was observed. A final control arteriogram performed through the balloon guide catheter demonstrated continued excellent apposition with inflow in caliber through the right internal carotid artery extra cranially and intracranially with no change in the right MCA distribution. The right anterior cerebral artery remained widely patent. Throughout the procedure, the patient's blood pressure and neurological status remained stable. Patient's ACT was maintained in the region of approximately 218 seconds. Balloon guide catheter was then retrieved and removed. The 8 French Pinnacle sheath was removed with the application of a 7 [place] ExoSeal closure device for hemostasis. Distal pulses remained palpable in both feet unchanged. A CT performed at the end of the procedure demonstrated no evidence of intracranial hemorrhage or mass effect. Patient's general anesthesia was then reversed and the patient was then extubated. Upon recovery, the patient was able to obey simple instructions without difficulty. She was able to bend her left knee against gravity as well as her left hand against gravity. She was then transferred to PACU and then neuro ICU for overnight observations with strict management of the patient's blood pressure. Overnight, the patient had a 9/10 supraorbital headache on the right. This responded to Tylenol. Otherwise, neurologically, the patient had returned to her preprocedural NIH stroke score. The following morning, the patient was more alert, awake and appropriate. Her left upper and lower extremities had gotten to approximately 4/5 strength with minimal decreased to light touch in the left upper and lower extremities. The right groin appeared soft. However, ecchymosis was seen  extending more laterally with focal tenderness just at the site of the quick clot. An ultrasound of the groin was requested to rule out the possibility of a small pseudoaneurysm. IMPRESSION: Status post endovascular complete revascularization of acutely occluded right internal carotid proximally with stent assisted angioplasty  with proximal flow arrest. Placement of a telescoping stent in the mid cervical right ICA due to persistent vasospasm. PLAN: Follow-up in the clinic 2-4 weeks post discharge. Electronically Signed   By: Julieanne Cotton M.D.   On: 08/15/2020 14:07   IR Intra Cran Stent  Result Date: 08/16/2020 CLINICAL DATA:  Right cerebral hemisphere ischemic stroke secondary to acute occlusion of the right internal carotid artery proximally with delayed string sign initially. EXAM: INTRACRANIAL STENT (INCL PTA) COMPARISON:  CT angiogram of the head and neck of August 12, 2020, and MRI MRA of the brain of August 13, 2020. MEDICATIONS: Heparin 3,500 units IV. Vancomycin antibiotic was administered within 1 hour of the procedure. ANESTHESIA/SEDATION: General anesthesia. CONTRAST:  Omnipaque 300 approximately 180 mL. FLUOROSCOPY TIME:  Fluoroscopy Time: 62 minutes 30 seconds (2136 mGy). COMPLICATIONS: None immediate. TECHNIQUE: Informed written consent was obtained from the patient after a thorough discussion of the procedural risks, benefits and alternatives. All questions were addressed. Maximal Sterile Barrier Technique was utilized including caps, mask, sterile gowns, sterile gloves, sterile drape, hand hygiene and skin antiseptic. A timeout was performed prior to the initiation of the procedure. The right groin was prepped and draped in the usual sterile fashion. Thereafter using modified Seldinger technique, transfemoral access into the right common femoral artery was obtained without difficulty. Over a 0.035 inch guidewire, an 8 [place] Pinnacle 25 cm sheath was inserted. Through this, and also over  0.035 inch guidewire, a 5 [place] JB 1 catheter was advanced to the aortic arch region and selectively positioned in the right common carotid artery, the right vertebral artery, the left common carotid artery and the left vertebral artery. FINDINGS: The left subclavian arteriogram demonstrates nonvisualization of the left vertebral artery. No distal reconstitution of the vessel is seen from branches arising from the ascending cervical branch of the thyrocervical trunk. Left common carotid arteriogram demonstrates the left external carotid artery and its major branches to be widely patent. The left internal carotid artery at the bulb to the cranial skull base is widely patent. The petrous, cavernous, and supraclinoid segments are patent as well. The left middle cerebral artery and the left anterior cerebral artery opacify into the capillary and venous phases. Prompt cross-filling via the anterior communicating artery of the right anterior cerebral A1 segment, and partial right middle cerebral artery distribution is noted with evidence of occlusion of the anterior perisylvian branches of the superior division of the right middle cerebral artery. The right vertebral artery origin is widely patent. The vessel is seen to opacify to the cranial skull base to the right posterior-inferior cerebellar artery and the right vertebrobasilar junction. The basilar artery, the posterior cerebral arteries, the superior cerebellar arteries and the anterior-inferior cerebellar arteries opacify into the capillary and venous phases. Collaterals arising from the right PCA P3 segment are seen to supply the right posterior parietal, cortical and subcortical region. Also demonstrated is retrograde opacification of the left vertebrobasilar junction to the left posterior-inferior cerebellar artery. No evidence of antegrade flow from the proximal left vertebral artery is seen. Right common carotid arteriogram demonstrates the right external  carotid artery and its major branches to be widely patent. Partial reconstitution of the right internal carotid artery at the cavernous segment is seen via the ethmoidal collaterals retrogradely opacifying the right ophthalmic artery. Delayed DSA demonstrates retrograde opacification of the right internal carotid artery to the cervical petrous junction. Partial opacification is seen of the right middle cerebral artery proximally in its branches. Unopacified blood is  seen from the contralateral left internal carotid artery via the anterior communicating artery. ENDOVASCULAR REVASCULARIZATION OF OCCLUDED RIGHT INTERNAL CAROTID ARTERY PROXIMALLY Over a 0.035 inch 300 cm Rosen exchange guidewire, an 087 Walrus balloon guide catheter which was prepped with 50% contrast and 50% heparinized saline infusion was advanced and positioned just proximal to the right common carotid bifurcation. The guidewire was removed. Good aspiration obtained from the hub of the balloon guide catheter. A control arteriogram performed through the balloon guide catheter demonstrated near complete occlusion of the right internal carotid artery distally with a proximal very faint string sign. Over an 014 inch standard Synchro micro guidewire with a moderate J configuration an 021 150 cm microcatheter was advanced distal to the balloon guide catheter. Using a torque device, multiple attempts were made to advance the micro guidewire through the severely narrowed paper thin string sign without success. Multiple attempts were then made using a different 0.014 inch Aristotle micro guidewire without success. A 125 cm 4 French slip catheter was then advanced with the balloon guide catheter and positioned pointing into the occluded right internal carotid artery. Through this an 035 inch Roadrunner guidewire was then gently advanced with minimal resistance through the occluded proximal right internal carotid artery into the distal cervical petrous  junction. The 4 French slip catheter was advanced to the mid cervical right ICA. Good aspiration of blood was obtained from the hub of the slip catheter. Very gentle contrast injection demonstrated antegrade flow into the distal right internal carotid artery. The 4 French slip catheter was then exchanged for an 014 inch 300 cm Zoom exchange micro guidewire with a moderate J-tip configuration. The distal end of the exchange micro guidewire was maintained in the horizontal petrous segment. A gentle control arteriogram performed through the balloon guide catheter demonstrated antegrade flow through the proximal right internal carotid artery more distally. A 15 mm smooth filling defect was now evident at the proximal right internal carotid artery. A 4 mm x 30 mm Viatrac 14 angioplasty balloon catheter was then prepped antegradely and retrogradely and then advanced using the rapid exchange technique to the distal end of the balloon guide catheter. Distal and proximal markers were positioned adequate distant from the severe ICA stenosis. A control angioplasty was then performed using micro inflation syringe device via micro tubing with balloon being expanded to just over 8 atmospheres where it was maintained for approximately 30 seconds. The balloon was then deflated and retrieved and removed. Control arteriogram performed through the guide catheter now demonstrated significant improved caliber and flow through the angioplastied segment and distally into the right internal carotid artery extra cranially and intracranially. The previously known occluded to anterior perisylvian branches of the superior division demonstrated string like flow in the M2 M3 region. Also noted was an occluded distal M3 branch of a parietal branch of the inferior division of the left middle cerebral artery. Following the angioplasty the proximal right internal carotid artery demonstrated smooth contour of the angioplastied segment most likely  representing the phased atherosclerotic plaques following the angioplasty. No true intraluminal filling defects were seen. Measurements were then performed of the left internal carotid artery in its most normal segment distally, and proximally the left common carotid artery. It was decided to proceed with placement of a 6/8 mm x 40 mm Xact stent. The stent delivery apparatus was retrogradely purged with heparinized saline infusion. Thereafter using the rapid exchange technique, the stent delivery system was then advanced without difficulty and positioned adequate distance from  the site of the angioplastied segment of the right internal carotid proximally. The stent was deployed in the usual manner without difficulty. Proximal flow arrest was initiated by inflating the balloon at the distal end of the balloon guide catheter prior to the angioplasty, and at the time of the advancement of the stent with aspiration at the hub of the balloon guide catheter. Following the placement of the stent, balloon was deflated and aspiration discontinued. With the exchange distal micro guidewire and in the horizontal petrous segment, delivery apparatus was retrieved and removed. A control arteriogram performed through the guide catheter now demonstrated excellent apposition and improved flow through the previously occluded right internal carotid proximally and distally intracranially. Again no evidence of new intraluminal filling defects or occlusion was seen intracranially. Significant spasm was seen just distal to the distal part of the stent. At least 4 aliquots of 25 mcg of nitroglycerin were then infused in divided doses with only temporary relief of the spasm. This also demonstrated caliber irregularity of the mid right internal carotid artery. Distal to this the artery remained smooth in caliber. Patient was also given 2.5 mg of verapamil intra-arterially with only partial temporary relief of the spasm. It was, therefore,  decided to proceed with placement of a 4.5 mm x 30 mm Neuroform Atlas stent telescoping into the distal segment of the first stent. Over the exchange micro guidewire, an 021 microcatheter was advanced into the horizontal petrous segment. The guidewire was removed. Good aspiration obtained from the hub of the microcatheter. Gentle control arteriogram performed through microcatheter demonstrated safe position of the tip of the microcatheter. This was then connected to continuous heparinized saline infusion. The 4.5 mm x 30 mm Neuroform Atlas stent was then advanced to the distal end of the microcatheter. The entire system was then retrieved more proximally such that the proximal portion of the Neuroform stent was just telescoping into the distal portion of the initial stent. Thereafter, the O ring on the delivery micro guidewire was loosened. With slight forward gentle traction with the right hand on the delivery micro guidewire, with the left hand the distal, and then the proximal portion of the stent were deployed with the proximal end telescoping just inside of the first stent. The delivery micro guidewire was removed with the microcatheter. Gentle control arteriogram performed through the balloon guide catheter in the right common carotid artery now demonstrated excellent flow throughout the entirety of the right internal carotid artery intracranially and extracranially with no change angiographically. 4.5 mg of intra-arterial Integrilin were also infused in order to prevent platelet aggregation within the stents. None was observed. A final control arteriogram performed through the balloon guide catheter demonstrated continued excellent apposition with inflow in caliber through the right internal carotid artery extra cranially and intracranially with no change in the right MCA distribution. The right anterior cerebral artery remained widely patent. Throughout the procedure, the patient's blood pressure and  neurological status remained stable. Patient's ACT was maintained in the region of approximately 218 seconds. Balloon guide catheter was then retrieved and removed. The 8 French Pinnacle sheath was removed with the application of a 7 [place] ExoSeal closure device for hemostasis. Distal pulses remained palpable in both feet unchanged. A CT performed at the end of the procedure demonstrated no evidence of intracranial hemorrhage or mass effect. Patient's general anesthesia was then reversed and the patient was then extubated. Upon recovery, the patient was able to obey simple instructions without difficulty. She was able to bend her  left knee against gravity as well as her left hand against gravity. She was then transferred to PACU and then neuro ICU for overnight observations with strict management of the patient's blood pressure. Overnight, the patient had a 9/10 supraorbital headache on the right. This responded to Tylenol. Otherwise, neurologically, the patient had returned to her preprocedural NIH stroke score. The following morning, the patient was more alert, awake and appropriate. Her left upper and lower extremities had gotten to approximately 4/5 strength with minimal decreased to light touch in the left upper and lower extremities. The right groin appeared soft. However, ecchymosis was seen extending more laterally with focal tenderness just at the site of the quick clot. An ultrasound of the groin was requested to rule out the possibility of a small pseudoaneurysm. IMPRESSION: Status post endovascular complete revascularization of acutely occluded right internal carotid proximally with stent assisted angioplasty with proximal flow arrest. Placement of a telescoping stent in the mid cervical right ICA due to persistent vasospasm. PLAN: Follow-up in the clinic 2-4 weeks post discharge. Electronically Signed   By: Julieanne Cotton M.D.   On: 08/15/2020 14:07   IR CT Head Ltd  Result Date:  08/16/2020 CLINICAL DATA:  Right cerebral hemisphere ischemic stroke secondary to acute occlusion of the right internal carotid artery proximally with delayed string sign initially. EXAM: INTRACRANIAL STENT (INCL PTA) COMPARISON:  CT angiogram of the head and neck of August 12, 2020, and MRI MRA of the brain of August 13, 2020. MEDICATIONS: Heparin 3,500 units IV. Vancomycin antibiotic was administered within 1 hour of the procedure. ANESTHESIA/SEDATION: General anesthesia. CONTRAST:  Omnipaque 300 approximately 180 mL. FLUOROSCOPY TIME:  Fluoroscopy Time: 62 minutes 30 seconds (2136 mGy). COMPLICATIONS: None immediate. TECHNIQUE: Informed written consent was obtained from the patient after a thorough discussion of the procedural risks, benefits and alternatives. All questions were addressed. Maximal Sterile Barrier Technique was utilized including caps, mask, sterile gowns, sterile gloves, sterile drape, hand hygiene and skin antiseptic. A timeout was performed prior to the initiation of the procedure. The right groin was prepped and draped in the usual sterile fashion. Thereafter using modified Seldinger technique, transfemoral access into the right common femoral artery was obtained without difficulty. Over a 0.035 inch guidewire, an 8 [place] Pinnacle 25 cm sheath was inserted. Through this, and also over 0.035 inch guidewire, a 5 [place] JB 1 catheter was advanced to the aortic arch region and selectively positioned in the right common carotid artery, the right vertebral artery, the left common carotid artery and the left vertebral artery. FINDINGS: The left subclavian arteriogram demonstrates nonvisualization of the left vertebral artery. No distal reconstitution of the vessel is seen from branches arising from the ascending cervical branch of the thyrocervical trunk. Left common carotid arteriogram demonstrates the left external carotid artery and its major branches to be widely patent. The left internal carotid  artery at the bulb to the cranial skull base is widely patent. The petrous, cavernous, and supraclinoid segments are patent as well. The left middle cerebral artery and the left anterior cerebral artery opacify into the capillary and venous phases. Prompt cross-filling via the anterior communicating artery of the right anterior cerebral A1 segment, and partial right middle cerebral artery distribution is noted with evidence of occlusion of the anterior perisylvian branches of the superior division of the right middle cerebral artery. The right vertebral artery origin is widely patent. The vessel is seen to opacify to the cranial skull base to the right posterior-inferior cerebellar artery  and the right vertebrobasilar junction. The basilar artery, the posterior cerebral arteries, the superior cerebellar arteries and the anterior-inferior cerebellar arteries opacify into the capillary and venous phases. Collaterals arising from the right PCA P3 segment are seen to supply the right posterior parietal, cortical and subcortical region. Also demonstrated is retrograde opacification of the left vertebrobasilar junction to the left posterior-inferior cerebellar artery. No evidence of antegrade flow from the proximal left vertebral artery is seen. Right common carotid arteriogram demonstrates the right external carotid artery and its major branches to be widely patent. Partial reconstitution of the right internal carotid artery at the cavernous segment is seen via the ethmoidal collaterals retrogradely opacifying the right ophthalmic artery. Delayed DSA demonstrates retrograde opacification of the right internal carotid artery to the cervical petrous junction. Partial opacification is seen of the right middle cerebral artery proximally in its branches. Unopacified blood is seen from the contralateral left internal carotid artery via the anterior communicating artery. ENDOVASCULAR REVASCULARIZATION OF OCCLUDED RIGHT INTERNAL  CAROTID ARTERY PROXIMALLY Over a 0.035 inch 300 cm Rosen exchange guidewire, an 087 Walrus balloon guide catheter which was prepped with 50% contrast and 50% heparinized saline infusion was advanced and positioned just proximal to the right common carotid bifurcation. The guidewire was removed. Good aspiration obtained from the hub of the balloon guide catheter. A control arteriogram performed through the balloon guide catheter demonstrated near complete occlusion of the right internal carotid artery distally with a proximal very faint string sign. Over an 014 inch standard Synchro micro guidewire with a moderate J configuration an 021 150 cm microcatheter was advanced distal to the balloon guide catheter. Using a torque device, multiple attempts were made to advance the micro guidewire through the severely narrowed paper thin string sign without success. Multiple attempts were then made using a different 0.014 inch Aristotle micro guidewire without success. A 125 cm 4 French slip catheter was then advanced with the balloon guide catheter and positioned pointing into the occluded right internal carotid artery. Through this an 035 inch Roadrunner guidewire was then gently advanced with minimal resistance through the occluded proximal right internal carotid artery into the distal cervical petrous junction. The 4 French slip catheter was advanced to the mid cervical right ICA. Good aspiration of blood was obtained from the hub of the slip catheter. Very gentle contrast injection demonstrated antegrade flow into the distal right internal carotid artery. The 4 French slip catheter was then exchanged for an 014 inch 300 cm Zoom exchange micro guidewire with a moderate J-tip configuration. The distal end of the exchange micro guidewire was maintained in the horizontal petrous segment. A gentle control arteriogram performed through the balloon guide catheter demonstrated antegrade flow through the proximal right internal  carotid artery more distally. A 15 mm smooth filling defect was now evident at the proximal right internal carotid artery. A 4 mm x 30 mm Viatrac 14 angioplasty balloon catheter was then prepped antegradely and retrogradely and then advanced using the rapid exchange technique to the distal end of the balloon guide catheter. Distal and proximal markers were positioned adequate distant from the severe ICA stenosis. A control angioplasty was then performed using micro inflation syringe device via micro tubing with balloon being expanded to just over 8 atmospheres where it was maintained for approximately 30 seconds. The balloon was then deflated and retrieved and removed. Control arteriogram performed through the guide catheter now demonstrated significant improved caliber and flow through the angioplastied segment and distally into the right internal  carotid artery extra cranially and intracranially. The previously known occluded to anterior perisylvian branches of the superior division demonstrated string like flow in the M2 M3 region. Also noted was an occluded distal M3 branch of a parietal branch of the inferior division of the left middle cerebral artery. Following the angioplasty the proximal right internal carotid artery demonstrated smooth contour of the angioplastied segment most likely representing the phased atherosclerotic plaques following the angioplasty. No true intraluminal filling defects were seen. Measurements were then performed of the left internal carotid artery in its most normal segment distally, and proximally the left common carotid artery. It was decided to proceed with placement of a 6/8 mm x 40 mm Xact stent. The stent delivery apparatus was retrogradely purged with heparinized saline infusion. Thereafter using the rapid exchange technique, the stent delivery system was then advanced without difficulty and positioned adequate distance from the site of the angioplastied segment of the right  internal carotid proximally. The stent was deployed in the usual manner without difficulty. Proximal flow arrest was initiated by inflating the balloon at the distal end of the balloon guide catheter prior to the angioplasty, and at the time of the advancement of the stent with aspiration at the hub of the balloon guide catheter. Following the placement of the stent, balloon was deflated and aspiration discontinued. With the exchange distal micro guidewire and in the horizontal petrous segment, delivery apparatus was retrieved and removed. A control arteriogram performed through the guide catheter now demonstrated excellent apposition and improved flow through the previously occluded right internal carotid proximally and distally intracranially. Again no evidence of new intraluminal filling defects or occlusion was seen intracranially. Significant spasm was seen just distal to the distal part of the stent. At least 4 aliquots of 25 mcg of nitroglycerin were then infused in divided doses with only temporary relief of the spasm. This also demonstrated caliber irregularity of the mid right internal carotid artery. Distal to this the artery remained smooth in caliber. Patient was also given 2.5 mg of verapamil intra-arterially with only partial temporary relief of the spasm. It was, therefore, decided to proceed with placement of a 4.5 mm x 30 mm Neuroform Atlas stent telescoping into the distal segment of the first stent. Over the exchange micro guidewire, an 021 microcatheter was advanced into the horizontal petrous segment. The guidewire was removed. Good aspiration obtained from the hub of the microcatheter. Gentle control arteriogram performed through microcatheter demonstrated safe position of the tip of the microcatheter. This was then connected to continuous heparinized saline infusion. The 4.5 mm x 30 mm Neuroform Atlas stent was then advanced to the distal end of the microcatheter. The entire system was then  retrieved more proximally such that the proximal portion of the Neuroform stent was just telescoping into the distal portion of the initial stent. Thereafter, the O ring on the delivery micro guidewire was loosened. With slight forward gentle traction with the right hand on the delivery micro guidewire, with the left hand the distal, and then the proximal portion of the stent were deployed with the proximal end telescoping just inside of the first stent. The delivery micro guidewire was removed with the microcatheter. Gentle control arteriogram performed through the balloon guide catheter in the right common carotid artery now demonstrated excellent flow throughout the entirety of the right internal carotid artery intracranially and extracranially with no change angiographically. 4.5 mg of intra-arterial Integrilin were also infused in order to prevent platelet aggregation within the stents. None  was observed. A final control arteriogram performed through the balloon guide catheter demonstrated continued excellent apposition with inflow in caliber through the right internal carotid artery extra cranially and intracranially with no change in the right MCA distribution. The right anterior cerebral artery remained widely patent. Throughout the procedure, the patient's blood pressure and neurological status remained stable. Patient's ACT was maintained in the region of approximately 218 seconds. Balloon guide catheter was then retrieved and removed. The 8 French Pinnacle sheath was removed with the application of a 7 [place] ExoSeal closure device for hemostasis. Distal pulses remained palpable in both feet unchanged. A CT performed at the end of the procedure demonstrated no evidence of intracranial hemorrhage or mass effect. Patient's general anesthesia was then reversed and the patient was then extubated. Upon recovery, the patient was able to obey simple instructions without difficulty. She was able to bend her left  knee against gravity as well as her left hand against gravity. She was then transferred to PACU and then neuro ICU for overnight observations with strict management of the patient's blood pressure. Overnight, the patient had a 9/10 supraorbital headache on the right. This responded to Tylenol. Otherwise, neurologically, the patient had returned to her preprocedural NIH stroke score. The following morning, the patient was more alert, awake and appropriate. Her left upper and lower extremities had gotten to approximately 4/5 strength with minimal decreased to light touch in the left upper and lower extremities. The right groin appeared soft. However, ecchymosis was seen extending more laterally with focal tenderness just at the site of the quick clot. An ultrasound of the groin was requested to rule out the possibility of a small pseudoaneurysm. IMPRESSION: Status post endovascular complete revascularization of acutely occluded right internal carotid proximally with stent assisted angioplasty with proximal flow arrest. Placement of a telescoping stent in the mid cervical right ICA due to persistent vasospasm. PLAN: Follow-up in the clinic 2-4 weeks post discharge. Electronically Signed   By: Julieanne Cotton M.D.   On: 08/15/2020 14:07   IR CT Head Ltd  Result Date: 08/16/2020 CLINICAL DATA:  Right cerebral hemisphere ischemic stroke secondary to acute occlusion of the right internal carotid artery proximally with delayed string sign initially. EXAM: INTRACRANIAL STENT (INCL PTA) COMPARISON:  CT angiogram of the head and neck of August 12, 2020, and MRI MRA of the brain of August 13, 2020. MEDICATIONS: Heparin 3,500 units IV. Vancomycin antibiotic was administered within 1 hour of the procedure. ANESTHESIA/SEDATION: General anesthesia. CONTRAST:  Omnipaque 300 approximately 180 mL. FLUOROSCOPY TIME:  Fluoroscopy Time: 62 minutes 30 seconds (2136 mGy). COMPLICATIONS: None immediate. TECHNIQUE: Informed written  consent was obtained from the patient after a thorough discussion of the procedural risks, benefits and alternatives. All questions were addressed. Maximal Sterile Barrier Technique was utilized including caps, mask, sterile gowns, sterile gloves, sterile drape, hand hygiene and skin antiseptic. A timeout was performed prior to the initiation of the procedure. The right groin was prepped and draped in the usual sterile fashion. Thereafter using modified Seldinger technique, transfemoral access into the right common femoral artery was obtained without difficulty. Over a 0.035 inch guidewire, an 8 [place] Pinnacle 25 cm sheath was inserted. Through this, and also over 0.035 inch guidewire, a 5 [place] JB 1 catheter was advanced to the aortic arch region and selectively positioned in the right common carotid artery, the right vertebral artery, the left common carotid artery and the left vertebral artery. FINDINGS: The left subclavian arteriogram demonstrates nonvisualization of the  left vertebral artery. No distal reconstitution of the vessel is seen from branches arising from the ascending cervical branch of the thyrocervical trunk. Left common carotid arteriogram demonstrates the left external carotid artery and its major branches to be widely patent. The left internal carotid artery at the bulb to the cranial skull base is widely patent. The petrous, cavernous, and supraclinoid segments are patent as well. The left middle cerebral artery and the left anterior cerebral artery opacify into the capillary and venous phases. Prompt cross-filling via the anterior communicating artery of the right anterior cerebral A1 segment, and partial right middle cerebral artery distribution is noted with evidence of occlusion of the anterior perisylvian branches of the superior division of the right middle cerebral artery. The right vertebral artery origin is widely patent. The vessel is seen to opacify to the cranial skull base to the  right posterior-inferior cerebellar artery and the right vertebrobasilar junction. The basilar artery, the posterior cerebral arteries, the superior cerebellar arteries and the anterior-inferior cerebellar arteries opacify into the capillary and venous phases. Collaterals arising from the right PCA P3 segment are seen to supply the right posterior parietal, cortical and subcortical region. Also demonstrated is retrograde opacification of the left vertebrobasilar junction to the left posterior-inferior cerebellar artery. No evidence of antegrade flow from the proximal left vertebral artery is seen. Right common carotid arteriogram demonstrates the right external carotid artery and its major branches to be widely patent. Partial reconstitution of the right internal carotid artery at the cavernous segment is seen via the ethmoidal collaterals retrogradely opacifying the right ophthalmic artery. Delayed DSA demonstrates retrograde opacification of the right internal carotid artery to the cervical petrous junction. Partial opacification is seen of the right middle cerebral artery proximally in its branches. Unopacified blood is seen from the contralateral left internal carotid artery via the anterior communicating artery. ENDOVASCULAR REVASCULARIZATION OF OCCLUDED RIGHT INTERNAL CAROTID ARTERY PROXIMALLY Over a 0.035 inch 300 cm Rosen exchange guidewire, an 087 Walrus balloon guide catheter which was prepped with 50% contrast and 50% heparinized saline infusion was advanced and positioned just proximal to the right common carotid bifurcation. The guidewire was removed. Good aspiration obtained from the hub of the balloon guide catheter. A control arteriogram performed through the balloon guide catheter demonstrated near complete occlusion of the right internal carotid artery distally with a proximal very faint string sign. Over an 014 inch standard Synchro micro guidewire with a moderate J configuration an 021 150 cm  microcatheter was advanced distal to the balloon guide catheter. Using a torque device, multiple attempts were made to advance the micro guidewire through the severely narrowed paper thin string sign without success. Multiple attempts were then made using a different 0.014 inch Aristotle micro guidewire without success. A 125 cm 4 French slip catheter was then advanced with the balloon guide catheter and positioned pointing into the occluded right internal carotid artery. Through this an 035 inch Roadrunner guidewire was then gently advanced with minimal resistance through the occluded proximal right internal carotid artery into the distal cervical petrous junction. The 4 French slip catheter was advanced to the mid cervical right ICA. Good aspiration of blood was obtained from the hub of the slip catheter. Very gentle contrast injection demonstrated antegrade flow into the distal right internal carotid artery. The 4 French slip catheter was then exchanged for an 014 inch 300 cm Zoom exchange micro guidewire with a moderate J-tip configuration. The distal end of the exchange micro guidewire was maintained in the  horizontal petrous segment. A gentle control arteriogram performed through the balloon guide catheter demonstrated antegrade flow through the proximal right internal carotid artery more distally. A 15 mm smooth filling defect was now evident at the proximal right internal carotid artery. A 4 mm x 30 mm Viatrac 14 angioplasty balloon catheter was then prepped antegradely and retrogradely and then advanced using the rapid exchange technique to the distal end of the balloon guide catheter. Distal and proximal markers were positioned adequate distant from the severe ICA stenosis. A control angioplasty was then performed using micro inflation syringe device via micro tubing with balloon being expanded to just over 8 atmospheres where it was maintained for approximately 30 seconds. The balloon was then deflated  and retrieved and removed. Control arteriogram performed through the guide catheter now demonstrated significant improved caliber and flow through the angioplastied segment and distally into the right internal carotid artery extra cranially and intracranially. The previously known occluded to anterior perisylvian branches of the superior division demonstrated string like flow in the M2 M3 region. Also noted was an occluded distal M3 branch of a parietal branch of the inferior division of the left middle cerebral artery. Following the angioplasty the proximal right internal carotid artery demonstrated smooth contour of the angioplastied segment most likely representing the phased atherosclerotic plaques following the angioplasty. No true intraluminal filling defects were seen. Measurements were then performed of the left internal carotid artery in its most normal segment distally, and proximally the left common carotid artery. It was decided to proceed with placement of a 6/8 mm x 40 mm Xact stent. The stent delivery apparatus was retrogradely purged with heparinized saline infusion. Thereafter using the rapid exchange technique, the stent delivery system was then advanced without difficulty and positioned adequate distance from the site of the angioplastied segment of the right internal carotid proximally. The stent was deployed in the usual manner without difficulty. Proximal flow arrest was initiated by inflating the balloon at the distal end of the balloon guide catheter prior to the angioplasty, and at the time of the advancement of the stent with aspiration at the hub of the balloon guide catheter. Following the placement of the stent, balloon was deflated and aspiration discontinued. With the exchange distal micro guidewire and in the horizontal petrous segment, delivery apparatus was retrieved and removed. A control arteriogram performed through the guide catheter now demonstrated excellent apposition and  improved flow through the previously occluded right internal carotid proximally and distally intracranially. Again no evidence of new intraluminal filling defects or occlusion was seen intracranially. Significant spasm was seen just distal to the distal part of the stent. At least 4 aliquots of 25 mcg of nitroglycerin were then infused in divided doses with only temporary relief of the spasm. This also demonstrated caliber irregularity of the mid right internal carotid artery. Distal to this the artery remained smooth in caliber. Patient was also given 2.5 mg of verapamil intra-arterially with only partial temporary relief of the spasm. It was, therefore, decided to proceed with placement of a 4.5 mm x 30 mm Neuroform Atlas stent telescoping into the distal segment of the first stent. Over the exchange micro guidewire, an 021 microcatheter was advanced into the horizontal petrous segment. The guidewire was removed. Good aspiration obtained from the hub of the microcatheter. Gentle control arteriogram performed through microcatheter demonstrated safe position of the tip of the microcatheter. This was then connected to continuous heparinized saline infusion. The 4.5 mm x 30 mm Neuroform Atlas stent  was then advanced to the distal end of the microcatheter. The entire system was then retrieved more proximally such that the proximal portion of the Neuroform stent was just telescoping into the distal portion of the initial stent. Thereafter, the O ring on the delivery micro guidewire was loosened. With slight forward gentle traction with the right hand on the delivery micro guidewire, with the left hand the distal, and then the proximal portion of the stent were deployed with the proximal end telescoping just inside of the first stent. The delivery micro guidewire was removed with the microcatheter. Gentle control arteriogram performed through the balloon guide catheter in the right common carotid artery now demonstrated  excellent flow throughout the entirety of the right internal carotid artery intracranially and extracranially with no change angiographically. 4.5 mg of intra-arterial Integrilin were also infused in order to prevent platelet aggregation within the stents. None was observed. A final control arteriogram performed through the balloon guide catheter demonstrated continued excellent apposition with inflow in caliber through the right internal carotid artery extra cranially and intracranially with no change in the right MCA distribution. The right anterior cerebral artery remained widely patent. Throughout the procedure, the patient's blood pressure and neurological status remained stable. Patient's ACT was maintained in the region of approximately 218 seconds. Balloon guide catheter was then retrieved and removed. The 8 French Pinnacle sheath was removed with the application of a 7 [place] ExoSeal closure device for hemostasis. Distal pulses remained palpable in both feet unchanged. A CT performed at the end of the procedure demonstrated no evidence of intracranial hemorrhage or mass effect. Patient's general anesthesia was then reversed and the patient was then extubated. Upon recovery, the patient was able to obey simple instructions without difficulty. She was able to bend her left knee against gravity as well as her left hand against gravity. She was then transferred to PACU and then neuro ICU for overnight observations with strict management of the patient's blood pressure. Overnight, the patient had a 9/10 supraorbital headache on the right. This responded to Tylenol. Otherwise, neurologically, the patient had returned to her preprocedural NIH stroke score. The following morning, the patient was more alert, awake and appropriate. Her left upper and lower extremities had gotten to approximately 4/5 strength with minimal decreased to light touch in the left upper and lower extremities. The right groin appeared soft.  However, ecchymosis was seen extending more laterally with focal tenderness just at the site of the quick clot. An ultrasound of the groin was requested to rule out the possibility of a small pseudoaneurysm. IMPRESSION: Status post endovascular complete revascularization of acutely occluded right internal carotid proximally with stent assisted angioplasty with proximal flow arrest. Placement of a telescoping stent in the mid cervical right ICA due to persistent vasospasm. PLAN: Follow-up in the clinic 2-4 weeks post discharge. Electronically Signed   By: Julieanne Cotton M.D.   On: 08/15/2020 14:07   VAS [place] GROIN PSEUDOANEURYSM  Result Date: 08/15/2020  ARTERIAL PSEUDOANEURYSM  Patient Name:  Vicki Gonzales  Date of Exam:   08/15/2020 Medical Rec #: [id number]                Accession #:    [id number] Date of Birth: [birth year]                Patient Gender: F Patient Age:   55 years Exam Location:  [facility] Procedure:      VAS [place] Bobetta Lime Referring Phys: Asher Muir  [place] --------------------------------------------------------------------------------  Exam: Right groin Indications: Patient complains of groin pain. History: S/p catheterization. Comparison Study: No prior study Performing Technologist: Gertie Fey MHA, RDMS, RVT, RDCS  Examination Guidelines: A complete evaluation includes B-mode imaging, spectral Doppler, color Doppler, and power Doppler as needed of all accessible portions of each vessel. Bilateral testing is considered an integral part of a complete examination. Limited examinations for reoccurring indications may be performed as noted. +------------+----------+---------+------+----------+ Right DuplexPSV (cm/s)Waveform PlaqueComment(s) +------------+----------+---------+------+----------+ CFA            134    triphasic                 +------------+----------+---------+------+----------+ PFA             74    biphasic                   +------------+----------+---------+------+----------+ Prox SFA       111    triphasic                 +------------+----------+---------+------+----------+  Findings: An area with well defined borders measuring 2.3 cm x 2.0 cm was visualized arising off of the right CFA with ultrasound characteristics of a pseudoaneurysm. Mixed echos within the structure suggest that it is partially thrombosed with a residual diameter  of 1.5 cm x 1.3 cm. The neck measures approximately 0.4 cm wide and 0.3 cm long.  Summary: Ultrasound characteristics of a pseudoaneurysm is visualized arising from the right CFA. Diagnosing physician: Coral Else MD Electronically signed by Coral Else MD on 08/15/2020 at 6:54:37 PM.    --------------------------------------------------------------------------------    Final    ECHOCARDIOGRAM COMPLETE  Result Date: 08/13/2020    ECHOCARDIOGRAM REPORT   Patient Name:   IVONNA KINNICK ALSTYNE Date of Exam: 08/13/2020 Medical Rec #:  [id number]               Height:       66.0 in Accession #:    [id number]              Weight:       186.3 lb Date of Birth:  [birth year]               BSA:          1.940 m Patient Age:    54 years                BP:           130/80 mmHg Patient Gender: F                       HR:           69 bpm. Exam Location:  Inpatient Procedure: 2D Echo, Cardiac Doppler and Color Doppler Indications:    CVA  History:        Patient has no prior history of Echocardiogram examinations.                 Risk Factors:Hypertension.  Sonographer:    Lavenia Atlas Referring Phys: [id number] [facility] IMPRESSIONS  1. Left ventricular ejection fraction, by estimation, is 60 to 65%. The left ventricle has normal function. The left ventricle has no regional wall motion abnormalities. Left ventricular diastolic parameters are consistent with Grade I diastolic dysfunction (impaired relaxation).  2. Right ventricular systolic function is normal. The right ventricular size is  normal. There is mildly elevated pulmonary artery systolic pressure.  3. The mitral  valve is normal in structure. No evidence of mitral valve regurgitation. No evidence of mitral stenosis.  4. The aortic valve is tricuspid. Aortic valve regurgitation is not visualized. Mild aortic valve sclerosis is present, with no evidence of aortic valve stenosis.  5. The inferior vena cava is normal in size with greater than 50% respiratory variability, suggesting right atrial pressure of 3 mmHg. Comparison(s): No prior Echocardiogram. FINDINGS  Left Ventricle: Left ventricular ejection fraction, by estimation, is 60 to 65%. The left ventricle has normal function. The left ventricle has no regional wall motion abnormalities. The left ventricular internal cavity size was normal in size. There is  no left ventricular hypertrophy. Left ventricular diastolic parameters are consistent with Grade I diastolic dysfunction (impaired relaxation). Right Ventricle: The right ventricular size is normal. Right ventricular systolic function is normal. There is mildly elevated pulmonary artery systolic pressure. The tricuspid regurgitant velocity is 2.95 m/s, and with an assumed right atrial pressure of 3 mmHg, the estimated right ventricular systolic pressure is 37.8 mmHg. Left Atrium: Left atrial size was normal in size. Right Atrium: Right atrial size was normal in size. Pericardium: There is no evidence of pericardial effusion. Mitral Valve: The mitral valve is normal in structure. Mild mitral annular calcification. No evidence of mitral valve regurgitation. No evidence of mitral valve stenosis. Tricuspid Valve: The tricuspid valve is normal in structure. Tricuspid valve regurgitation is mild . No evidence of tricuspid stenosis. Aortic Valve: The aortic valve is tricuspid. Aortic valve regurgitation is not visualized. Mild aortic valve sclerosis is present, with no evidence of aortic valve stenosis. Pulmonic Valve: The pulmonic valve was  not well visualized. Pulmonic valve regurgitation is not visualized. No evidence of pulmonic stenosis. Aorta: The aortic root is normal in size and structure. Venous: The inferior vena cava is normal in size with greater than 50% respiratory variability, suggesting right atrial pressure of 3 mmHg. IAS/Shunts: No atrial level shunt detected by color flow Doppler.  LEFT VENTRICLE PLAX 2D LVIDd:         4.40 cm  Diastology LVIDs:         2.80 cm  LV e' medial:    6.20 cm/s LV PW:         1.10 cm  LV E/e' medial:  11.7 LV IVS:        1.10 cm  LV e' lateral:   6.53 cm/s LVOT diam:     2.00 cm  LV E/e' lateral: 11.1 LV SV:         60 LV SV Index:   31 LVOT Area:     3.14 cm  RIGHT VENTRICLE RV Basal diam:  3.30 cm RV S prime:     8.92 cm/s TAPSE (M-mode): 2.9 cm LEFT ATRIUM             Index       RIGHT ATRIUM           Index LA diam:        4.00 cm 2.06 cm/m  RA Area:     13.40 cm LA Vol (A2C):   31.1 ml 16.03 ml/m RA Volume:   34.60 ml  17.83 ml/m LA Vol (A4C):   42.7 ml 22.01 ml/m LA Biplane Vol: 38.1 ml 19.64 ml/m  AORTIC VALVE LVOT Vmax:   108.00 cm/s LVOT Vmean:  72.400 cm/s LVOT VTI:    0.191 m  AORTA Ao Root diam: 2.70 cm MITRAL VALVE  TRICUSPID VALVE MV Area (PHT): 3.24 cm    TR Peak grad:   34.8 mmHg MV Decel Time: 234 msec    TR Vmax:        295.00 cm/s MV E velocity: 72.40 cm/s MV A velocity: 87.80 cm/s  SHUNTS MV E/A ratio:  0.82        Systemic VTI:  0.19 m                            Systemic Diam: 2.00 cm Olga Millers MD Electronically signed by Olga Millers MD Signature Date/Time: 08/13/2020/12:12:42 PM    Final    CT HEAD CODE STROKE WO CONTRAST  Result Date: 08/12/2020 CLINICAL DATA:  Code stroke.  Slurred speech EXAM: CT HEAD WITHOUT CONTRAST TECHNIQUE: Contiguous axial images were obtained from the base of the skull through the vertex without intravenous contrast. COMPARISON:  None. FINDINGS: Brain: Cytotoxic edema appearance in the right insula and frontal operculum no  ganglionic or supraganglionic infarct is seen. No acute hemorrhage, hydrocephalus, or collection. Vascular: Hyperdense right M1 suggested on sagittal images, although potentially accentuated by acute infarct in not persistent on the other planes. Skull: No acute finding Sinuses/Orbits: Negative Other: Critical Value/emergent results were called by telephone at the time of interpretation on 08/12/2020 at 11:43 am to provider Dorothea Glassman , who verbally acknowledged these results. ASPECTS [facility] Stroke Program Early CT Score) - Ganglionic level infarction (caudate, lentiform nuclei, internal capsule, insula, M1-M3 cortex): 5 - Supraganglionic infarction (M4-M6 cortex): 3 Total score (0-10 with 10 being normal): 8 IMPRESSION: 1. Acute infarct in the right insula and frontal operculum, ASPECTS is 8. 2.  Equivocal hyperdensity of the right M1. 3. No acute hemorrhage. Electronically Signed   By: Marnee Spring M.D.   On: 08/12/2020 11:45   VAS US CAROTID  Result Date: 08/15/2020 Carotid Arterial Duplex Study Patient Name:  Vicki Gonzales  Date of Exam:   08/15/2020 Medical Rec #: [id number]                Accession #:    [id number] Date of Birth: 1965-01-29                Patient Gender: F Patient Age:   10 years Exam Location:  [facility] Procedure:      VAS US CAROTID Referring Phys: Alwyn Ren --------------------------------------------------------------------------------  Indications:       History of right ICA occlusion with recent stent angioplasty Comparison Study:  CTA neck 08/12/20- right ICA occlusion, left vertebral artery                    occlusion. Performing Technologist: Gertie Fey MHA, RDMS, RVT, RDCS  Examination Guidelines: A complete evaluation includes B-mode imaging, spectral Doppler, color Doppler, and power Doppler as needed of all accessible portions of each vessel. Bilateral testing is considered an integral part of a complete examination. Limited examinations for  reoccurring indications may be performed as noted.  Right Carotid Findings: +----------+--------+--------+--------+------------------+------------------+           PSV cm/sEDV cm/sStenosisPlaque DescriptionComments           +----------+--------+--------+--------+------------------+------------------+ CCA Prox  92      15                                                   +----------+--------+--------+--------+------------------+------------------+  CCA Distal69      15                                intimal thickening +----------+--------+--------+--------+------------------+------------------+ ICA Distal67      30                                                   +----------+--------+--------+--------+------------------+------------------+ ECA       152     13                                                   +----------+--------+--------+--------+------------------+------------------+ +----------+--------+-------+----------------+-------------------+           PSV cm/sEDV cmsDescribe        Arm Pressure (mmHG) +----------+--------+-------+----------------+-------------------+ [id number]            Multiphasic, WNL                    +----------+--------+-------+----------------+-------------------+ +---------+--------+--+--------+--+---------+ VertebralPSV cm/s89EDV cm/s32Antegrade +---------+--------+--+--------+--+---------+  Right Stent(s): +---------------+--+--++++ Prox to Stent  6915 +---------------+--+--++++ Proximal Stent 5519 +---------------+--+--++++ Mid Stent      6628 +---------------+--+--++++ Distal Stent   9940 +---------------+--+--++++ Distal to [id number] +---------------+--+--++++   Left Carotid Findings: +----------+--------+--------+--------+------------------+------------------+           PSV cm/sEDV cm/sStenosisPlaque DescriptionComments            +----------+--------+--------+--------+------------------+------------------+ CCA Prox  111     27                                                   +----------+--------+--------+--------+------------------+------------------+ CCA Distal88      26                                                   +----------+--------+--------+--------+------------------+------------------+ ICA Prox  82      28                                intimal thickening +----------+--------+--------+--------+------------------+------------------+ ICA Distal95      42                                                   +----------+--------+--------+--------+------------------+------------------+ ECA       86      13                                                   +----------+--------+--------+--------+------------------+------------------+ +----------+--------+--------+----------------+-------------------+           PSV cm/sEDV cm/sDescribe  Arm Pressure (mmHG) +----------+--------+--------+----------------+-------------------+ [id number]             Multiphasic, WNL                    +----------+--------+--------+----------------+-------------------+ +---------+--------+--------+------+ VertebralPSV cm/sEDV cm/sAbsent +---------+--------+--------+------+   Summary: Right Carotid: The right ICA stent appears <50% stenosed. Left Carotid: The extracranial vessels were near-normal with only minimal wall               thickening or plaque. Vertebrals:  Right vertebral artery demonstrates antegrade flow. Left vertebral              artery demonstrates an occlusion, consistent with CTA neck. Subclavians: Normal flow hemodynamics were seen in bilateral subclavian              arteries. *See table(s) above for measurements and observations.  Electronically signed by Coral Else MD on 08/15/2020 at 6:56:39 PM.    Final    CT ANGIO HEAD NECK W WO CM W PERF (CODE STROKE)  Result Date:  08/12/2020 CLINICAL DATA:  Slurred speech. EXAM: CT ANGIOGRAPHY HEAD AND NECK CT PERFUSION BRAIN TECHNIQUE: Multidetector CT imaging of the head and neck was performed using the standard protocol during bolus administration of intravenous contrast. Multiplanar CT image reconstructions and MIPs were obtained to evaluate the vascular anatomy. Carotid stenosis measurements (when applicable) are obtained utilizing NASCET criteria, using the distal internal carotid diameter as the denominator. Multiphase CT imaging of the brain was performed following IV bolus contrast injection. Subsequent parametric perfusion maps were calculated using RAPID software. CONTRAST:  OMNIPAQUE IOHEXOL 350 MG/ML SOLN COMPARISON:  None. FINDINGS: CTA NECK FINDINGS Aortic arch: Standard 3 vessel aortic arch with widely patent arch vessel origins. Right carotid system: The common carotid artery is patent. There is predominantly soft plaque at the carotid bifurcation, and the ICA is occluded proximally. Left carotid system: Patent without evidence of stenosis or dissection. Vertebral arteries: The right vertebral artery is patent and dominant without evidence of a significant stenosis or dissection. The left vertebral artery is occluded at its origin, and there is distal reconstitution of small V2 and V3 segments. Skeleton: No acute osseous abnormality or suspicious osseous lesion. Mild cervical spondylosis. Asymmetrically advanced left facet arthrosis at C2-3. Other neck: No evidence of cervical lymphadenopathy or mass. Upper chest: Clear lung apices. Review of the MIP images confirms the above findings CTA HEAD FINDINGS Anterior circulation: There is reconstitution of the right ICA beginning in the mid to distal petrous segment with the vessel being patent more distally through the terminus though small in caliber and irregular including a possible severe distal cavernous segment stenosis. The right MCA is patent proximally, however there  is diminished enhancement and poor delineation of the right MCA bifurcation suspicious for a subocclusive embolus or severe stenosis. The branches distal to this are grossly patent. The intracranial left ICA is patent with mild atherosclerotic plaque not resulting in a significant stenosis. The left MCA and left ACA are patent without evidence of a flow limiting proximal stenosis. The right ACA is patent although the right A1 segment is small and irregular with moderate narrowing in its midportion. No aneurysm is identified. Posterior circulation: The intracranial vertebral arteries are patent to the basilar. Patent PICA and SCA origins are seen bilaterally. The basilar artery is widely patent. There are small right and diminutive or absent left posterior communicating arteries. The PCAs are patent without evidence of a significant proximal stenosis. No aneurysm is identified. Venous  sinuses: Patent. Anatomic variants: None. Review of the MIP images confirms the above findings CT Brain Perfusion Findings: ASPECTS: 8 CBF (<30%) Volume: 0mL Perfusion (Tmax>6.0s) volume: 25mL Mismatch Volume: 25mL Infarction Location: The core infarct in the right insula and right frontal operculum on the earlier noncontrast head CT is not detected by the automated RAPID processing. The 25 mL penumbra indicated by RAPID appears to be located slightly posterior to the infarct on CT. IMPRESSION: 1. 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 2. Finding suspicious for a subocclusive embolus versus severe stenosis at the right MCA bifurcation. 3. Occlusion of the left vertebral artery at its origin with distal reconstitution. 4. CTP results as detailed above including a 25 mL penumbra. These results were communicated to Dr. Selina Cooley at 12:31 pm on 08/12/2020 by text page via the [facility] messaging system. Electronically Signed   By: Sebastian Ache M.D.   On: 08/12/2020 12:54   IR ANGIO INTRA EXTRACRAN SEL COM CAROTID INNOMINATE  UNI L MOD SED  Result Date: 08/16/2020 CLINICAL DATA:  Right cerebral hemisphere ischemic stroke secondary to acute occlusion of the right internal carotid artery proximally with delayed string sign initially. EXAM: INTRACRANIAL STENT (INCL PTA) COMPARISON:  CT angiogram of the head and neck of August 12, 2020, and MRI MRA of the brain of August 13, 2020. MEDICATIONS: Heparin 3,500 units IV. Vancomycin antibiotic was administered within 1 hour of the procedure. ANESTHESIA/SEDATION: General anesthesia. CONTRAST:  Omnipaque 300 approximately 180 mL. FLUOROSCOPY TIME:  Fluoroscopy Time: 62 minutes 30 seconds (2136 mGy). COMPLICATIONS: None immediate. TECHNIQUE: Informed written consent was obtained from the patient after a thorough discussion of the procedural risks, benefits and alternatives. All questions were addressed. Maximal Sterile Barrier Technique was utilized including caps, mask, sterile gowns, sterile gloves, sterile drape, hand hygiene and skin antiseptic. A timeout was performed prior to the initiation of the procedure. The right groin was prepped and draped in the usual sterile fashion. Thereafter using modified Seldinger technique, transfemoral access into the right common femoral artery was obtained without difficulty. Over a 0.035 inch guidewire, an 8 [place] Pinnacle 25 cm sheath was inserted. Through this, and also over 0.035 inch guidewire, a 5 [place] JB 1 catheter was advanced to the aortic arch region and selectively positioned in the right common carotid artery, the right vertebral artery, the left common carotid artery and the left vertebral artery. FINDINGS: The left subclavian arteriogram demonstrates nonvisualization of the left vertebral artery. No distal reconstitution of the vessel is seen from branches arising from the ascending cervical branch of the thyrocervical trunk. Left common carotid arteriogram demonstrates the left external carotid artery and its major branches to be widely  patent. The left internal carotid artery at the bulb to the cranial skull base is widely patent. The petrous, cavernous, and supraclinoid segments are patent as well. The left middle cerebral artery and the left anterior cerebral artery opacify into the capillary and venous phases. Prompt cross-filling via the anterior communicating artery of the right anterior cerebral A1 segment, and partial right middle cerebral artery distribution is noted with evidence of occlusion of the anterior perisylvian branches of the superior division of the right middle cerebral artery. The right vertebral artery origin is widely patent. The vessel is seen to opacify to the cranial skull base to the right posterior-inferior cerebellar artery and the right vertebrobasilar junction. The basilar artery, the posterior cerebral arteries, the superior cerebellar arteries and the anterior-inferior cerebellar arteries opacify into the capillary and venous phases.  Collaterals arising from the right PCA P3 segment are seen to supply the right posterior parietal, cortical and subcortical region. Also demonstrated is retrograde opacification of the left vertebrobasilar junction to the left posterior-inferior cerebellar artery. No evidence of antegrade flow from the proximal left vertebral artery is seen. Right common carotid arteriogram demonstrates the right external carotid artery and its major branches to be widely patent. Partial reconstitution of the right internal carotid artery at the cavernous segment is seen via the ethmoidal collaterals retrogradely opacifying the right ophthalmic artery. Delayed DSA demonstrates retrograde opacification of the right internal carotid artery to the cervical petrous junction. Partial opacification is seen of the right middle cerebral artery proximally in its branches. Unopacified blood is seen from the contralateral left internal carotid artery via the anterior communicating artery. ENDOVASCULAR  REVASCULARIZATION OF OCCLUDED RIGHT INTERNAL CAROTID ARTERY PROXIMALLY Over a 0.035 inch 300 cm Rosen exchange guidewire, an 087 Walrus balloon guide catheter which was prepped with 50% contrast and 50% heparinized saline infusion was advanced and positioned just proximal to the right common carotid bifurcation. The guidewire was removed. Good aspiration obtained from the hub of the balloon guide catheter. A control arteriogram performed through the balloon guide catheter demonstrated near complete occlusion of the right internal carotid artery distally with a proximal very faint string sign. Over an 014 inch standard Synchro micro guidewire with a moderate J configuration an 021 150 cm microcatheter was advanced distal to the balloon guide catheter. Using a torque device, multiple attempts were made to advance the micro guidewire through the severely narrowed paper thin string sign without success. Multiple attempts were then made using a different 0.014 inch Aristotle micro guidewire without success. A 125 cm 4 French slip catheter was then advanced with the balloon guide catheter and positioned pointing into the occluded right internal carotid artery. Through this an 035 inch Roadrunner guidewire was then gently advanced with minimal resistance through the occluded proximal right internal carotid artery into the distal cervical petrous junction. The 4 French slip catheter was advanced to the mid cervical right ICA. Good aspiration of blood was obtained from the hub of the slip catheter. Very gentle contrast injection demonstrated antegrade flow into the distal right internal carotid artery. The 4 French slip catheter was then exchanged for an 014 inch 300 cm Zoom exchange micro guidewire with a moderate J-tip configuration. The distal end of the exchange micro guidewire was maintained in the horizontal petrous segment. A gentle control arteriogram performed through the balloon guide catheter demonstrated  antegrade flow through the proximal right internal carotid artery more distally. A 15 mm smooth filling defect was now evident at the proximal right internal carotid artery. A 4 mm x 30 mm Viatrac 14 angioplasty balloon catheter was then prepped antegradely and retrogradely and then advanced using the rapid exchange technique to the distal end of the balloon guide catheter. Distal and proximal markers were positioned adequate distant from the severe ICA stenosis. A control angioplasty was then performed using micro inflation syringe device via micro tubing with balloon being expanded to just over 8 atmospheres where it was maintained for approximately 30 seconds. The balloon was then deflated and retrieved and removed. Control arteriogram performed through the guide catheter now demonstrated significant improved caliber and flow through the angioplastied segment and distally into the right internal carotid artery extra cranially and intracranially. The previously known occluded to anterior perisylvian branches of the superior division demonstrated string like flow in the M2 M3 region. Also  noted was an occluded distal M3 branch of a parietal branch of the inferior division of the left middle cerebral artery. Following the angioplasty the proximal right internal carotid artery demonstrated smooth contour of the angioplastied segment most likely representing the phased atherosclerotic plaques following the angioplasty. No true intraluminal filling defects were seen. Measurements were then performed of the left internal carotid artery in its most normal segment distally, and proximally the left common carotid artery. It was decided to proceed with placement of a 6/8 mm x 40 mm Xact stent. The stent delivery apparatus was retrogradely purged with heparinized saline infusion. Thereafter using the rapid exchange technique, the stent delivery system was then advanced without difficulty and positioned adequate distance from  the site of the angioplastied segment of the right internal carotid proximally. The stent was deployed in the usual manner without difficulty. Proximal flow arrest was initiated by inflating the balloon at the distal end of the balloon guide catheter prior to the angioplasty, and at the time of the advancement of the stent with aspiration at the hub of the balloon guide catheter. Following the placement of the stent, balloon was deflated and aspiration discontinued. With the exchange distal micro guidewire and in the horizontal petrous segment, delivery apparatus was retrieved and removed. A control arteriogram performed through the guide catheter now demonstrated excellent apposition and improved flow through the previously occluded right internal carotid proximally and distally intracranially. Again no evidence of new intraluminal filling defects or occlusion was seen intracranially. Significant spasm was seen just distal to the distal part of the stent. At least 4 aliquots of 25 mcg of nitroglycerin were then infused in divided doses with only temporary relief of the spasm. This also demonstrated caliber irregularity of the mid right internal carotid artery. Distal to this the artery remained smooth in caliber. Patient was also given 2.5 mg of verapamil intra-arterially with only partial temporary relief of the spasm. It was, therefore, decided to proceed with placement of a 4.5 mm x 30 mm Neuroform Atlas stent telescoping into the distal segment of the first stent. Over the exchange micro guidewire, an 021 microcatheter was advanced into the horizontal petrous segment. The guidewire was removed. Good aspiration obtained from the hub of the microcatheter. Gentle control arteriogram performed through microcatheter demonstrated safe position of the tip of the microcatheter. This was then connected to continuous heparinized saline infusion. The 4.5 mm x 30 mm Neuroform Atlas stent was then advanced to the distal end  of the microcatheter. The entire system was then retrieved more proximally such that the proximal portion of the Neuroform stent was just telescoping into the distal portion of the initial stent. Thereafter, the O ring on the delivery micro guidewire was loosened. With slight forward gentle traction with the right hand on the delivery micro guidewire, with the left hand the distal, and then the proximal portion of the stent were deployed with the proximal end telescoping just inside of the first stent. The delivery micro guidewire was removed with the microcatheter. Gentle control arteriogram performed through the balloon guide catheter in the right common carotid artery now demonstrated excellent flow throughout the entirety of the right internal carotid artery intracranially and extracranially with no change angiographically. 4.5 mg of intra-arterial Integrilin were also infused in order to prevent platelet aggregation within the stents. None was observed. A final control arteriogram performed through the balloon guide catheter demonstrated continued excellent apposition with inflow in caliber through the right internal carotid artery extra cranially  and intracranially with no change in the right MCA distribution. The right anterior cerebral artery remained widely patent. Throughout the procedure, the patient's blood pressure and neurological status remained stable. Patient's ACT was maintained in the region of approximately 218 seconds. Balloon guide catheter was then retrieved and removed. The 8 French Pinnacle sheath was removed with the application of a 7 [place] ExoSeal closure device for hemostasis. Distal pulses remained palpable in both feet unchanged. A CT performed at the end of the procedure demonstrated no evidence of intracranial hemorrhage or mass effect. Patient's general anesthesia was then reversed and the patient was then extubated. Upon recovery, the patient was able to obey simple instructions  without difficulty. She was able to bend her left knee against gravity as well as her left hand against gravity. She was then transferred to PACU and then neuro ICU for overnight observations with strict management of the patient's blood pressure. Overnight, the patient had a 9/10 supraorbital headache on the right. This responded to Tylenol. Otherwise, neurologically, the patient had returned to her preprocedural NIH stroke score. The following morning, the patient was more alert, awake and appropriate. Her left upper and lower extremities had gotten to approximately 4/5 strength with minimal decreased to light touch in the left upper and lower extremities. The right groin appeared soft. However, ecchymosis was seen extending more laterally with focal tenderness just at the site of the quick clot. An ultrasound of the groin was requested to rule out the possibility of a small pseudoaneurysm. IMPRESSION: Status post endovascular complete revascularization of acutely occluded right internal carotid proximally with stent assisted angioplasty with proximal flow arrest. Placement of a telescoping stent in the mid cervical right ICA due to persistent vasospasm. PLAN: Follow-up in the clinic 2-4 weeks post discharge. Electronically Signed   By: Julieanne Cotton M.D.   On: 08/15/2020 14:07   IR ANGIO INTRA EXTRACRAN SEL INTERNAL CAROTID UNI R MOD SED  Result Date: 08/16/2020 CLINICAL DATA:  Right cerebral hemisphere ischemic stroke secondary to acute occlusion of the right internal carotid artery proximally with delayed string sign initially. EXAM: INTRACRANIAL STENT (INCL PTA) COMPARISON:  CT angiogram of the head and neck of August 12, 2020, and MRI MRA of the brain of August 13, 2020. MEDICATIONS: Heparin 3,500 units IV. Vancomycin antibiotic was administered within 1 hour of the procedure. ANESTHESIA/SEDATION: General anesthesia. CONTRAST:  Omnipaque 300 approximately 180 mL. FLUOROSCOPY TIME:  Fluoroscopy Time: 62  minutes 30 seconds (2136 mGy). COMPLICATIONS: None immediate. TECHNIQUE: Informed written consent was obtained from the patient after a thorough discussion of the procedural risks, benefits and alternatives. All questions were addressed. Maximal Sterile Barrier Technique was utilized including caps, mask, sterile gowns, sterile gloves, sterile drape, hand hygiene and skin antiseptic. A timeout was performed prior to the initiation of the procedure. The right groin was prepped and draped in the usual sterile fashion. Thereafter using modified Seldinger technique, transfemoral access into the right common femoral artery was obtained without difficulty. Over a 0.035 inch guidewire, an 8 [place] Pinnacle 25 cm sheath was inserted. Through this, and also over 0.035 inch guidewire, a 5 [place] JB 1 catheter was advanced to the aortic arch region and selectively positioned in the right common carotid artery, the right vertebral artery, the left common carotid artery and the left vertebral artery. FINDINGS: The left subclavian arteriogram demonstrates nonvisualization of the left vertebral artery. No distal reconstitution of the vessel is seen from branches arising from the ascending cervical branch of the  thyrocervical trunk. Left common carotid arteriogram demonstrates the left external carotid artery and its major branches to be widely patent. The left internal carotid artery at the bulb to the cranial skull base is widely patent. The petrous, cavernous, and supraclinoid segments are patent as well. The left middle cerebral artery and the left anterior cerebral artery opacify into the capillary and venous phases. Prompt cross-filling via the anterior communicating artery of the right anterior cerebral A1 segment, and partial right middle cerebral artery distribution is noted with evidence of occlusion of the anterior perisylvian branches of the superior division of the right middle cerebral artery. The right vertebral  artery origin is widely patent. The vessel is seen to opacify to the cranial skull base to the right posterior-inferior cerebellar artery and the right vertebrobasilar junction. The basilar artery, the posterior cerebral arteries, the superior cerebellar arteries and the anterior-inferior cerebellar arteries opacify into the capillary and venous phases. Collaterals arising from the right PCA P3 segment are seen to supply the right posterior parietal, cortical and subcortical region. Also demonstrated is retrograde opacification of the left vertebrobasilar junction to the left posterior-inferior cerebellar artery. No evidence of antegrade flow from the proximal left vertebral artery is seen. Right common carotid arteriogram demonstrates the right external carotid artery and its major branches to be widely patent. Partial reconstitution of the right internal carotid artery at the cavernous segment is seen via the ethmoidal collaterals retrogradely opacifying the right ophthalmic artery. Delayed DSA demonstrates retrograde opacification of the right internal carotid artery to the cervical petrous junction. Partial opacification is seen of the right middle cerebral artery proximally in its branches. Unopacified blood is seen from the contralateral left internal carotid artery via the anterior communicating artery. ENDOVASCULAR REVASCULARIZATION OF OCCLUDED RIGHT INTERNAL CAROTID ARTERY PROXIMALLY Over a 0.035 inch 300 cm Rosen exchange guidewire, an 087 Walrus balloon guide catheter which was prepped with 50% contrast and 50% heparinized saline infusion was advanced and positioned just proximal to the right common carotid bifurcation. The guidewire was removed. Good aspiration obtained from the hub of the balloon guide catheter. A control arteriogram performed through the balloon guide catheter demonstrated near complete occlusion of the right internal carotid artery distally with a proximal very faint string sign. Over  an 014 inch standard Synchro micro guidewire with a moderate J configuration an 021 150 cm microcatheter was advanced distal to the balloon guide catheter. Using a torque device, multiple attempts were made to advance the micro guidewire through the severely narrowed paper thin string sign without success. Multiple attempts were then made using a different 0.014 inch Aristotle micro guidewire without success. A 125 cm 4 French slip catheter was then advanced with the balloon guide catheter and positioned pointing into the occluded right internal carotid artery. Through this an 035 inch Roadrunner guidewire was then gently advanced with minimal resistance through the occluded proximal right internal carotid artery into the distal cervical petrous junction. The 4 French slip catheter was advanced to the mid cervical right ICA. Good aspiration of blood was obtained from the hub of the slip catheter. Very gentle contrast injection demonstrated antegrade flow into the distal right internal carotid artery. The 4 French slip catheter was then exchanged for an 014 inch 300 cm Zoom exchange micro guidewire with a moderate J-tip configuration. The distal end of the exchange micro guidewire was maintained in the horizontal petrous segment. A gentle control arteriogram performed through the balloon guide catheter demonstrated antegrade flow through the proximal right internal  carotid artery more distally. A 15 mm smooth filling defect was now evident at the proximal right internal carotid artery. A 4 mm x 30 mm Viatrac 14 angioplasty balloon catheter was then prepped antegradely and retrogradely and then advanced using the rapid exchange technique to the distal end of the balloon guide catheter. Distal and proximal markers were positioned adequate distant from the severe ICA stenosis. A control angioplasty was then performed using micro inflation syringe device via micro tubing with balloon being expanded to just over 8  atmospheres where it was maintained for approximately 30 seconds. The balloon was then deflated and retrieved and removed. Control arteriogram performed through the guide catheter now demonstrated significant improved caliber and flow through the angioplastied segment and distally into the right internal carotid artery extra cranially and intracranially. The previously known occluded to anterior perisylvian branches of the superior division demonstrated string like flow in the M2 M3 region. Also noted was an occluded distal M3 branch of a parietal branch of the inferior division of the left middle cerebral artery. Following the angioplasty the proximal right internal carotid artery demonstrated smooth contour of the angioplastied segment most likely representing the phased atherosclerotic plaques following the angioplasty. No true intraluminal filling defects were seen. Measurements were then performed of the left internal carotid artery in its most normal segment distally, and proximally the left common carotid artery. It was decided to proceed with placement of a 6/8 mm x 40 mm Xact stent. The stent delivery apparatus was retrogradely purged with heparinized saline infusion. Thereafter using the rapid exchange technique, the stent delivery system was then advanced without difficulty and positioned adequate distance from the site of the angioplastied segment of the right internal carotid proximally. The stent was deployed in the usual manner without difficulty. Proximal flow arrest was initiated by inflating the balloon at the distal end of the balloon guide catheter prior to the angioplasty, and at the time of the advancement of the stent with aspiration at the hub of the balloon guide catheter. Following the placement of the stent, balloon was deflated and aspiration discontinued. With the exchange distal micro guidewire and in the horizontal petrous segment, delivery apparatus was retrieved and removed. A  control arteriogram performed through the guide catheter now demonstrated excellent apposition and improved flow through the previously occluded right internal carotid proximally and distally intracranially. Again no evidence of new intraluminal filling defects or occlusion was seen intracranially. Significant spasm was seen just distal to the distal part of the stent. At least 4 aliquots of 25 mcg of nitroglycerin were then infused in divided doses with only temporary relief of the spasm. This also demonstrated caliber irregularity of the mid right internal carotid artery. Distal to this the artery remained smooth in caliber. Patient was also given 2.5 mg of verapamil intra-arterially with only partial temporary relief of the spasm. It was, therefore, decided to proceed with placement of a 4.5 mm x 30 mm Neuroform Atlas stent telescoping into the distal segment of the first stent. Over the exchange micro guidewire, an 021 microcatheter was advanced into the horizontal petrous segment. The guidewire was removed. Good aspiration obtained from the hub of the microcatheter. Gentle control arteriogram performed through microcatheter demonstrated safe position of the tip of the microcatheter. This was then connected to continuous heparinized saline infusion. The 4.5 mm x 30 mm Neuroform Atlas stent was then advanced to the distal end of the microcatheter. The entire system was then retrieved more proximally such that the  proximal portion of the Neuroform stent was just telescoping into the distal portion of the initial stent. Thereafter, the O ring on the delivery micro guidewire was loosened. With slight forward gentle traction with the right hand on the delivery micro guidewire, with the left hand the distal, and then the proximal portion of the stent were deployed with the proximal end telescoping just inside of the first stent. The delivery micro guidewire was removed with the microcatheter. Gentle control  arteriogram performed through the balloon guide catheter in the right common carotid artery now demonstrated excellent flow throughout the entirety of the right internal carotid artery intracranially and extracranially with no change angiographically. 4.5 mg of intra-arterial Integrilin were also infused in order to prevent platelet aggregation within the stents. None was observed. A final control arteriogram performed through the balloon guide catheter demonstrated continued excellent apposition with inflow in caliber through the right internal carotid artery extra cranially and intracranially with no change in the right MCA distribution. The right anterior cerebral artery remained widely patent. Throughout the procedure, the patient's blood pressure and neurological status remained stable. Patient's ACT was maintained in the region of approximately 218 seconds. Balloon guide catheter was then retrieved and removed. The 8 French Pinnacle sheath was removed with the application of a 7 [place] ExoSeal closure device for hemostasis. Distal pulses remained palpable in both feet unchanged. A CT performed at the end of the procedure demonstrated no evidence of intracranial hemorrhage or mass effect. Patient's general anesthesia was then reversed and the patient was then extubated. Upon recovery, the patient was able to obey simple instructions without difficulty. She was able to bend her left knee against gravity as well as her left hand against gravity. She was then transferred to PACU and then neuro ICU for overnight observations with strict management of the patient's blood pressure. Overnight, the patient had a 9/10 supraorbital headache on the right. This responded to Tylenol. Otherwise, neurologically, the patient had returned to her preprocedural NIH stroke score. The following morning, the patient was more alert, awake and appropriate. Her left upper and lower extremities had gotten to approximately 4/5 strength  with minimal decreased to light touch in the left upper and lower extremities. The right groin appeared soft. However, ecchymosis was seen extending more laterally with focal tenderness just at the site of the quick clot. An ultrasound of the groin was requested to rule out the possibility of a small pseudoaneurysm. IMPRESSION: Status post endovascular complete revascularization of acutely occluded right internal carotid proximally with stent assisted angioplasty with proximal flow arrest. Placement of a telescoping stent in the mid cervical right ICA due to persistent vasospasm. PLAN: Follow-up in the clinic 2-4 weeks post discharge. Electronically Signed   By: Julieanne Cotton M.D.   On: 08/15/2020 14:07   IR ANGIO VERTEBRAL SEL SUBCLAVIAN INNOMINATE BILAT MOD SED  Result Date: 08/16/2020 CLINICAL DATA:  Right cerebral hemisphere ischemic stroke secondary to acute occlusion of the right internal carotid artery proximally with delayed string sign initially. EXAM: INTRACRANIAL STENT (INCL PTA) COMPARISON:  CT angiogram of the head and neck of August 12, 2020, and MRI MRA of the brain of August 13, 2020. MEDICATIONS: Heparin 3,500 units IV. Vancomycin antibiotic was administered within 1 hour of the procedure. ANESTHESIA/SEDATION: General anesthesia. CONTRAST:  Omnipaque 300 approximately 180 mL. FLUOROSCOPY TIME:  Fluoroscopy Time: 62 minutes 30 seconds (2136 mGy). COMPLICATIONS: None immediate. TECHNIQUE: Informed written consent was obtained from the patient after a thorough discussion of the  procedural risks, benefits and alternatives. All questions were addressed. Maximal Sterile Barrier Technique was utilized including caps, mask, sterile gowns, sterile gloves, sterile drape, hand hygiene and skin antiseptic. A timeout was performed prior to the initiation of the procedure. The right groin was prepped and draped in the usual sterile fashion. Thereafter using modified Seldinger technique, transfemoral access  into the right common femoral artery was obtained without difficulty. Over a 0.035 inch guidewire, an 8 [place] Pinnacle 25 cm sheath was inserted. Through this, and also over 0.035 inch guidewire, a 5 [place] JB 1 catheter was advanced to the aortic arch region and selectively positioned in the right common carotid artery, the right vertebral artery, the left common carotid artery and the left vertebral artery. FINDINGS: The left subclavian arteriogram demonstrates nonvisualization of the left vertebral artery. No distal reconstitution of the vessel is seen from branches arising from the ascending cervical branch of the thyrocervical trunk. Left common carotid arteriogram demonstrates the left external carotid artery and its major branches to be widely patent. The left internal carotid artery at the bulb to the cranial skull base is widely patent. The petrous, cavernous, and supraclinoid segments are patent as well. The left middle cerebral artery and the left anterior cerebral artery opacify into the capillary and venous phases. Prompt cross-filling via the anterior communicating artery of the right anterior cerebral A1 segment, and partial right middle cerebral artery distribution is noted with evidence of occlusion of the anterior perisylvian branches of the superior division of the right middle cerebral artery. The right vertebral artery origin is widely patent. The vessel is seen to opacify to the cranial skull base to the right posterior-inferior cerebellar artery and the right vertebrobasilar junction. The basilar artery, the posterior cerebral arteries, the superior cerebellar arteries and the anterior-inferior cerebellar arteries opacify into the capillary and venous phases. Collaterals arising from the right PCA P3 segment are seen to supply the right posterior parietal, cortical and subcortical region. Also demonstrated is retrograde opacification of the left vertebrobasilar junction to the left  posterior-inferior cerebellar artery. No evidence of antegrade flow from the proximal left vertebral artery is seen. Right common carotid arteriogram demonstrates the right external carotid artery and its major branches to be widely patent. Partial reconstitution of the right internal carotid artery at the cavernous segment is seen via the ethmoidal collaterals retrogradely opacifying the right ophthalmic artery. Delayed DSA demonstrates retrograde opacification of the right internal carotid artery to the cervical petrous junction. Partial opacification is seen of the right middle cerebral artery proximally in its branches. Unopacified blood is seen from the contralateral left internal carotid artery via the anterior communicating artery. ENDOVASCULAR REVASCULARIZATION OF OCCLUDED RIGHT INTERNAL CAROTID ARTERY PROXIMALLY Over a 0.035 inch 300 cm Rosen exchange guidewire, an 087 Wal[place] balloon guide catheter which was prepped with 50% contrast and 50% heparinized saline infusion was advanced and positioned just proximal to the right common carotid bifurcation. The guidewire was removed. Good aspiration obtained from the hub of the balloon guide catheter. A control arteriogram performed through the balloon guide catheter demonstrated near complete occlusion of the right internal carotid artery distally with a proximal very faint string sign. Over an 014 inch standard Synchro micro guidewire with a moderate J configuration an 021 150 cm microcatheter was advanced distal to the balloon guide catheter. Using a torque device, multiple attempts were made to advance the micro guidewire through the severely narrowed paper thin string sign without success. Multiple attempts were then made using a  different 0.014 inch Aristotle micro guidewire without success. A 125 cm 4 French slip catheter was then advanced with the balloon guide catheter and positioned pointing into the occluded right internal carotid artery. Through this  an 035 inch Roadrunner guidewire was then gently advanced with minimal resistance through the occluded proximal right internal carotid artery into the distal cervical petrous junction. The 4 French slip catheter was advanced to the mid cervical right ICA. Good aspiration of blood was obtained from the hub of the slip catheter. Very gentle contrast injection demonstrated antegrade flow into the distal right internal carotid artery. The 4 French slip catheter was then exchanged for an 014 inch 300 cm Zoom exchange micro guidewire with a moderate J-tip configuration. The distal end of the exchange micro guidewire was maintained in the horizontal petrous segment. A gentle control arteriogram performed through the balloon guide catheter demonstrated antegrade flow through the proximal right internal carotid artery more distally. A 15 mm smooth filling defect was now evident at the proximal right internal carotid artery. A 4 mm x 30 mm Viatrac 14 angioplasty balloon catheter was then prepped antegradely and retrogradely and then advanced using the rapid exchange technique to the distal end of the balloon guide catheter. Distal and proximal markers were positioned adequate distant from the severe ICA stenosis. A control angioplasty was then performed using micro inflation syringe device via micro tubing with balloon being expanded to just over 8 atmospheres where it was maintained for approximately 30 seconds. The balloon was then deflated and retrieved and removed. Control arteriogram performed through the guide catheter now demonstrated significant improved caliber and flow through the angioplastied segment and distally into the right internal carotid artery extra cranially and intracranially. The previously known occluded to anterior perisylvian branches of the superior division demonstrated string like flow in the M2 M3 region. Also noted was an occluded distal M3 branch of a parietal branch of the inferior division of  the left middle cerebral artery. Following the angioplasty the proximal right internal carotid artery demonstrated smooth contour of the angioplastied segment most likely representing the phased atherosclerotic plaques following the angioplasty. No true intraluminal filling defects were seen. Measurements were then performed of the left internal carotid artery in its most normal segment distally, and proximally the left common carotid artery. It was decided to proceed with placement of a 6/8 mm x 40 mm Xact stent. The stent delivery apparatus was retrogradely purged with heparinized saline infusion. Thereafter using the rapid exchange technique, the stent delivery system was then advanced without difficulty and positioned adequate distance from the site of the angioplastied segment of the right internal carotid proximally. The stent was deployed in the usual manner without difficulty. Proximal flow arrest was initiated by inflating the balloon at the distal end of the balloon guide catheter prior to the angioplasty, and at the time of the advancement of the stent with aspiration at the hub of the balloon guide catheter. Following the placement of the stent, balloon was deflated and aspiration discontinued. With the exchange distal micro guidewire and in the horizontal petrous segment, delivery apparatus was retrieved and removed. A control arteriogram performed through the guide catheter now demonstrated excellent apposition and improved flow through the previously occluded right internal carotid proximally and distally intracranially. Again no evidence of new intraluminal filling defects or occlusion was seen intracranially. Significant spasm was seen just distal to the distal part of the stent. At least 4 aliquots of 25 mcg of nitroglycerin were then infused  in divided doses with only temporary relief of the spasm. This also demonstrated caliber irregularity of the mid right internal carotid artery. Distal to this  the artery remained smooth in caliber. Patient was also given 2.5 mg of verapamil intra-arterially with only partial temporary relief of the spasm. It was, therefore, decided to proceed with placement of a 4.5 mm x 30 mm Neuroform Atlas stent telescoping into the distal segment of the first stent. Over the exchange micro guidewire, an 021 microcatheter was advanced into the horizontal petrous segment. The guidewire was removed. Good aspiration obtained from the hub of the microcatheter. Gentle control arteriogram performed through microcatheter demonstrated safe position of the tip of the microcatheter. This was then connected to continuous heparinized saline infusion. The 4.5 mm x 30 mm Neuroform Atlas stent was then advanced to the distal end of the microcatheter. The entire system was then retrieved more proximally such that the proximal portion of the Neuroform stent was just telescoping into the distal portion of the initial stent. Thereafter, the O ring on the delivery micro guidewire was loosened. With slight forward gentle traction with the right hand on the delivery micro guidewire, with the left hand the distal, and then the proximal portion of the stent were deployed with the proximal end telescoping just inside of the first stent. The delivery micro guidewire was removed with the microcatheter. Gentle control arteriogram performed through the balloon guide catheter in the right common carotid artery now demonstrated excellent flow throughout the entirety of the right internal carotid artery intracranially and extracranially with no change angiographically. 4.5 mg of intra-arterial Integrilin were also infused in order to prevent platelet aggregation within the stents. None was observed. A final control arteriogram performed through the balloon guide catheter demonstrated continued excellent apposition with inflow in caliber through the right internal carotid artery extra cranially and intracranially with  no change in the right MCA distribution. The right anterior cerebral artery remained widely patent. Throughout the procedure, the patient's blood pressure and neurological status remained stable. Patient's ACT was maintained in the region of approximately 218 seconds. Balloon guide catheter was then retrieved and removed. The 8 French Pinnacle sheath was removed with the application of a 7 [place] ExoSeal closure device for hemostasis. Distal pulses remained palpable in both feet unchanged. A CT performed at the end of the procedure demonstrated no evidence of intracranial hemorrhage or mass effect. Patient's general anesthesia was then reversed and the patient was then extubated. Upon recovery, the patient was able to obey simple instructions without difficulty. She was able to bend her left knee against gravity as well as her left hand against gravity. She was then transferred to PACU and then neuro ICU for overnight observations with strict management of the patient's blood pressure. Overnight, the patient had a 9/10 supraorbital headache on the right. This responded to Tylenol. Otherwise, neurologically, the patient had returned to her preprocedural NIH stroke score. The following morning, the patient was more alert, awake and appropriate. Her left upper and lower extremities had gotten to approximately 4/5 strength with minimal decreased to light touch in the left upper and lower extremities. The right groin appeared soft. However, ecchymosis was seen extending more laterally with focal tenderness just at the site of the quick clot. An ultrasound of the groin was requested to rule out the possibility of a small pseudoaneurysm. IMPRESSION: Status post endovascular complete revascularization of acutely occluded right internal carotid proximally with stent assisted angioplasty with proximal flow arrest. Placement of  a telescoping stent in the mid cervical right ICA due to persistent vasospasm. PLAN: Follow-up in  the clinic 2-4 weeks post discharge. Electronically Signed   By: Julieanne Cotton M.D.   On: 08/15/2020 14:07    Labs:  CBC: Recent Labs    08/13/20 0700 08/14/20 0340 08/15/20 0536 08/16/20 0416  WBC 8.6 10.4 12.6* 11.1*  HGB 13.2 12.6 11.0* 10.5*  HCT 40.4 37.1 32.8* 31.9*  PLT 350 307 277 276    COAGS: Recent Labs    08/12/20 1148 08/14/20 0340  INR 1.0 1.0  APTT 26  --     BMP: Recent Labs    08/13/20 0700 08/14/20 0340 08/15/20 0536 08/16/20 0416  NA 139 141 140 140  K 3.6 3.3* 2.8* 3.3*  CL 103 110 109 109  CO2 28 22 21* 22  GLUCOSE 115* 125* 97 134*  BUN 14 8 6 9   CALCIUM 9.2 8.9 8.3* 8.7*  CREATININE 0.84 0.63 0.53 0.55  GFRNONAA >60 >60 >60 >60    LIVER FUNCTION TESTS: Recent Labs    08/12/20 1148  BILITOT 1.3*  AST 21  ALT 22  ALKPHOS 70  PROT 7.7  ALBUMIN 4.3    Assessment and Plan:  55 yo female with right MCA infarct due to right ICA occlusion s/p four vessel cerebral arteriogram with revascularization of occluded proximal right ICA via stent-assisted angioplasty 08/14/20; case complicated by right CFA pseudoaneurysm development s/p repair by vascular surgery on 8/10    Patient stable, right CFA puncture site stable. No activity limitation per vascular surgery.  Persistent left sided weakness and sensory deficit on left arm and leg  Fine motor and coordination intact on right, slower in left Right DP 1+  Patient has not ambulated yet today.   PLAN  - continue ASA 81 mg qd and Brilinta 90 mg BID for at least 6 months - follow up visit with Dr. Corliss Skains at [facility] 3-4 weeks after discharge (order in)  - bilateral carotid vascular ultrasound in 6 month from 08/14/20 (order in)  Further treatment plan per Stroke/ Vascular surgery  Appreciate and agree with the plan.  NIR to follow.    Electronically Signed: Willette Brace, PA-C 08/16/2020, 8:30 AM   I spent a total of 25 Minutes at the the patient's bedside AND on the patient's  hospital floor or unit, greater than 50% of which was counseling/coordinating care for s/p code stroke and s/p four vessel cerebral arteriogram with revascularization of occluded proximal right ICA via stent-assisted angioplasty 08/14/20 with Dr. Corliss Skains

## 2020-08-17 ENCOUNTER — Inpatient Hospital Stay (HOSPITAL_COMMUNITY): Payer: BC Managed Care – PPO

## 2020-08-17 ENCOUNTER — Inpatient Hospital Stay (HOSPITAL_COMMUNITY): Payer: BC Managed Care – PPO | Admitting: Anesthesiology

## 2020-08-17 ENCOUNTER — Encounter (HOSPITAL_COMMUNITY): Payer: Self-pay | Admitting: Neurology

## 2020-08-17 ENCOUNTER — Encounter (HOSPITAL_COMMUNITY)
Admission: EM | Disposition: A | Discharge status: Rehabilitation Facility | Payer: Self-pay | Source: Home / Self Care | Attending: Neurology

## 2020-08-17 DIAGNOSIS — Q211 Atrial septal defect: Secondary | ICD-10-CM

## 2020-08-17 DIAGNOSIS — I361 Nonrheumatic tricuspid (valve) insufficiency: Secondary | ICD-10-CM

## 2020-08-17 DIAGNOSIS — I63511 Cerebral infarction due to unspecified occlusion or stenosis of right middle cerebral artery: Secondary | ICD-10-CM

## 2020-08-17 DIAGNOSIS — Q2112 Patent foramen ovale: Secondary | ICD-10-CM

## 2020-08-17 DIAGNOSIS — I639 Cerebral infarction, unspecified: Secondary | ICD-10-CM

## 2020-08-17 HISTORY — PX: BUBBLE STUDY: SHX6837

## 2020-08-17 HISTORY — PX: TEE WITHOUT CARDIOVERSION: SHX5443

## 2020-08-17 LAB — HOMOCYSTEINE: Homocysteine: 5 umol/L (ref 0.0–14.5)

## 2020-08-17 LAB — CBC
HCT: 30.3 % — ABNORMAL LOW (ref 36.0–46.0)
Hemoglobin: 10.2 g/dL — ABNORMAL LOW (ref 12.0–15.0)
MCH: 29.1 pg (ref 26.0–34.0)
MCHC: 33.7 g/dL (ref 30.0–36.0)
MCV: 86.3 fL (ref 80.0–100.0)
Platelets: 276 10*3/uL (ref 150–400)
RBC: 3.51 MIL/uL — ABNORMAL LOW (ref 3.87–5.11)
RDW: 14.4 % (ref 11.5–15.5)
WBC: 12.2 10*3/uL — ABNORMAL HIGH (ref 4.0–10.5)
nRBC: 0 % (ref 0.0–0.2)

## 2020-08-17 LAB — BASIC METABOLIC PANEL
Anion gap: 7 (ref 5–15)
BUN: 13 mg/dL (ref 6–20)
CO2: 22 mmol/L (ref 22–32)
Calcium: 8.2 mg/dL — ABNORMAL LOW (ref 8.9–10.3)
Chloride: 111 mmol/L (ref 98–111)
Creatinine, Ser: 0.58 mg/dL (ref 0.44–1.00)
GFR, Estimated: >60 mL/min (ref >60)
Glucose, Bld: 112 mg/dL — ABNORMAL HIGH (ref 70–99)
Potassium: 3 mmol/L — ABNORMAL LOW (ref 3.5–5.1)
Sodium: 140 mmol/L (ref 135–145)

## 2020-08-17 LAB — CARDIOLIPIN ANTIBODIES, IGG, IGM, IGA
Anticardiolipin IgA: <9 APL U/mL (ref 0–11)
Anticardiolipin IgG: <9 GPL U/mL (ref 0–14)
Anticardiolipin IgM: <9 MPL U/mL (ref 0–12)

## 2020-08-17 SURGERY — ECHOCARDIOGRAM, TRANSESOPHAGEAL
Anesthesia: Monitor Anesthesia Care

## 2020-08-17 MED ORDER — PROPOFOL 500 MG/50ML IV EMUL
INTRAVENOUS | Status: DC | PRN
  Administered 2020-08-17: 125 ug/kg/min via INTRAVENOUS

## 2020-08-17 MED ORDER — POTASSIUM CHLORIDE 20 MEQ PO PACK
40.0000 meq | PACK | ORAL | Status: AC
  Administered 2020-08-17 (×2): 40 meq via ORAL
  Filled 2020-08-17 (×2): qty 2

## 2020-08-17 MED ORDER — SODIUM CHLORIDE 0.9 % IV SOLN: INTRAVENOUS | Status: DC | PRN

## 2020-08-17 NOTE — Anesthesia Preprocedure Evaluation (Addendum)
Anesthesia Evaluation  Patient identified by MRN, date of birth, ID band Patient awake    Reviewed: Allergy & Precautions, H&P , NPO status , Patient's Chart, lab work & pertinent test results  Airway Mallampati: II  TM Distance: >3 FB Neck ROM: Full    Dental no notable dental hx. (+) Teeth Intact, Dental Advisory Given   Pulmonary neg pulmonary ROS, former smoker,    Pulmonary exam normal breath sounds clear to auscultation       Cardiovascular hypertension, Pt. on medications  Rhythm:Regular Rate:Normal     Neuro/Psych  Headaches, Anxiety Depression CVA, Residual Symptoms    GI/Hepatic negative GI ROS, Neg liver ROS,   Endo/Other  Hypothyroidism   Renal/GU negative Renal ROS  negative genitourinary   Musculoskeletal  (+) Arthritis , Osteoarthritis,    Abdominal   Peds  Hematology negative hematology ROS (+)   Anesthesia Other Findings   Reproductive/Obstetrics negative OB ROS                            Anesthesia Physical Anesthesia Plan  ASA: 3  Anesthesia Plan: MAC   Post-op Pain Management:    Induction: Intravenous  PONV Risk Score and Plan: 2 and Propofol infusion and Treatment may vary due to age or medical condition  Airway Management Planned: Nasal Cannula  Additional Equipment:   Intra-op Plan:   Post-operative Plan:   Informed Consent: I have reviewed the patients History and Physical, chart, labs and discussed the procedure including the risks, benefits and alternatives for the proposed anesthesia with the patient or authorized representative who has indicated his/her understanding and acceptance.     Dental advisory given  Plan Discussed with: CRNA  Anesthesia Plan Comments:         Anesthesia Quick Evaluation

## 2020-08-17 NOTE — Progress Notes (Signed)
Physical Therapy Treatment Patient Details Name: Vicki Gonzales MRN: [id number] DOB: 1965/03/14 Today's Date: 08/17/2020    History of Present Illness Pt is a 55 y.o. F who presents with dysarthric speech and left facial droop. Found to have a developing R MCA stroke with an ASPECTS of 8 and a R MCA bifurcation subocclusive thrombus vs stenosis. Received tPA. She reported headache on arrival to [facility] and MRI SWI sequences confirmed a small volume R frontal SAH. Thrombectomy was not pursued and tPA was not reversed immediately due to concern for progression of R MCA stroke. Pt developed Rt femoral  pseudoaneurysm8/11  and underwent repair  Significant PMH: HTN, migraines.    PT Comments    The pt was able to demo great progress with sit-stand transfers, stand-pivot transfers, and was able to initiate short bouts of stepping during today's session. She continues to require modA and BUE support due to deficits in strength, dynamic stability, postural awareness, and LLE coordination. The pt remains highly motivated to improve, and was able to generate small steps in all directions at this time with modA, further gait progression limited both by deficits in dynamic stability and strength as well as arrival of arrival of staff to complete bubble study. The pt will continue to benefit from skilled PT acutely to progress ambulation distance and dynamic stability. Continue to recommend CIR level therapies.     Follow Up Recommendations  CIR     Equipment Recommendations  Other (comment) (defer to post acute)    Recommendations for Other Services       Precautions / Restrictions Precautions Precautions: Fall;Other (comment) Precaution Comments: L inattention, hemiplegia, SBP < 140 Restrictions Weight Bearing Restrictions: No    Mobility  Bed Mobility Overal bed mobility: Needs Assistance Bed Mobility: Supine to Sit;Sit to Supine     Supine to sit: Min guard Sit to supine:  Min guard   General bed mobility comments: min guard assist for safety increased time to complete    Transfers Overall transfer level: Needs assistance Equipment used: 1 person hand held assist Transfers: Sit to/from [employer] Sit to Stand: Mod assist Stand pivot transfers: Mod assist       General transfer comment: pt completed with modA of 1 with BUE support from HHAposterior lean with initial stand, needing cues for head position and posture. able to take small pivoting steps with minimal clearance and modA  Ambulation/Gait Ambulation/Gait assistance: Mod assist Gait Distance (Feet): 3 Feet (+5 ft +54ft) Assistive device: 1 person hand held assist Gait Pattern/deviations: Step-through pattern;Step-to pattern;Shuffle;Decreased stride length;Staggering left;Staggering right Gait velocity: decreased Gait velocity interpretation: <1.31 ft/sec, indicative of household ambulator General Gait Details: pt able to take lateral pivoting steps to reach [facility] with modA of 1, then taking small forward steps with step to pattern and assist at hips to decrease lateral wt shift. able to take steps in all direction with decreased clearance, decreased coordination of LLE step   Modified Rankin (Stroke Patients Only) Modified Rankin (Stroke Patients Only) Pre-Morbid Rankin Score: No symptoms Modified Rankin: Moderately severe disability     Balance Overall balance assessment: Needs assistance Sitting-balance support: Feet supported Sitting balance-Leahy Scale: Fair Sitting balance - Comments: reliant on UE support and up to min A due to posterior lean and being easily distracted   Standing balance support: Bilateral upper extremity supported;During functional activity Standing balance-Leahy Scale: Poor Standing balance comment: Pt requires UE support.  Less pushing noted this date  Cognition Arousal/Alertness: Awake/alert Behavior  During Therapy: Flat affect;Impulsive;Restless Overall Cognitive Status: Impaired/Different from baseline Area of Impairment: Attention;Following commands;Safety/judgement;Awareness;Problem solving                   Current Attention Level: Sustained;Selective   Following Commands: Follows one step commands consistently;Follows multi-step commands inconsistently Safety/Judgement: Decreased awareness of safety;Decreased awareness of deficits Awareness: Emergent Problem Solving: Difficulty sequencing;Requires verbal cues;Requires tactile cues General Comments: Pt distractale today, but able to sustain attention for longer bouts and followed commands related to mobility more briskly. still needing cues for sequencing, problem solving, and attending to positioning of all limbs      Exercises      General Comments        Pertinent Vitals/Pain Pain Assessment: Faces Faces Pain Scale: Hurts little more Pain Location: Rt groin Pain Descriptors / Indicators: Discomfort;Guarding;Grimacing Pain Intervention(s): Limited activity within patient's tolerance;Monitored during session;Repositioned     PT Goals (current goals can now be found in the care plan section) Acute Rehab PT Goals Patient Stated Goal: to take a nap PT Goal Formulation: With patient Time For Goal Achievement: 08/27/20 Potential to Achieve Goals: Good Progress towards PT goals: Progressing toward goals    Frequency    Min 4X/week      PT Plan Current plan remains appropriate       AM-PAC PT "6 Clicks" Mobility   Outcome Measure  Help needed turning from your back to your side while in a flat bed without using bedrails?: A Little Help needed moving from lying on your back to sitting on the side of a flat bed without using bedrails?: A Lot Help needed moving to and from a bed to a chair (including a wheelchair)?: A Lot Help needed standing up from a chair using your arms (e.g., wheelchair or bedside  chair)?: A Lot Help needed to walk in hospital room?: A Lot Help needed climbing 3-5 steps with a railing? : Total 6 Click Score: 12    End of Session Equipment Utilized During Treatment: Gait belt Activity Tolerance: Patient tolerated treatment well;Patient limited by fatigue Patient left: in bed;with call bell/phone within reach;with bed alarm set;with nursing/sitter in room Nurse Communication: Mobility status PT Visit Diagnosis: Unsteadiness on feet (R26.81);Difficulty in walking, not elsewhere classified (R26.2);Hemiplegia and hemiparesis Hemiplegia - Right/Left: Left Hemiplegia - dominant/non-dominant: Non-dominant Hemiplegia - caused by: Cerebral infarction;Nontraumatic SAH     Time: 3496-1164 PT Time Calculation (min) (ACUTE ONLY): 19 min  Charges:  $Gait Training: 8-22 mins                     Lazarus Gowda, PT, DPT   Acute Rehabilitation Department Pager #: [phone]   Ronnie Derby 08/17/2020, 3:54 PM

## 2020-08-17 NOTE — Progress Notes (Signed)
Went to see patient with Dr. Corliss Skains, patient getting TEE.   Per Dr. Roda Shutters, patient is doing well and the left upper and lower extremity sensory deficit is improving. Patient may be discharged to [facility] tomorrow.  NIR will sign off, please call NIR for questions and concerns.   PLAN  - continue ASA 81 mg qd and Brilinta 90 mg BID for at least 6 months - follow up visit with Dr. Corliss Skains at [facility] NIR 3-4 weeks after discharge (order in)  - bilateral carotid vascular ultrasound in 6 month from 08/14/20 (order in)   Merrissa Giacobbe H Bibiana Gillean PA-C 08/17/2020 12:11 PM

## 2020-08-17 NOTE — Progress Notes (Signed)
TCD bubble study and lower extremity venous has been completed.   Preliminary results in CV Proc.   Blanch Media 08/17/2020 3:14 PM

## 2020-08-17 NOTE — CV Procedure (Signed)
    TRANSESOPHAGEAL ECHOCARDIOGRAM   NAME:  Vicki Gonzales   MRN: [id number] DOB:  Feb 14, 1965   ADMIT DATE: 08/12/2020  INDICATIONS: CVA  PROCEDURE:   Informed consent was obtained prior to the procedure. The risks, benefits and alternatives for the procedure were discussed and the patient comprehended these risks.  Risks include, but are not limited to, cough, sore throat, vomiting, nausea, somnolence, esophageal and stomach trauma or perforation, bleeding, low blood pressure, aspiration, pneumonia, infection, trauma to the teeth and death.    Procedural time out performed. The oropharynx was anesthetized with topical 1% cetacaine.    Patient received monitored anesthesia care under the supervision of Dr. Sampson Goon. Patient received a total of 190 mg propofol during the procedure.  The transesophageal probe was inserted in the esophagus and stomach without difficulty and multiple views were obtained.    COMPLICATIONS:    There were no immediate complications.  FINDINGS:  LEFT VENTRICLE: EF = 60-65%. No regional wall motion abnormalities.  RIGHT VENTRICLE: Normal size and function.   LEFT ATRIUM: No thrombus/mass.  LEFT ATRIAL APPENDAGE: No thrombus/mass.   RIGHT ATRIUM: No thrombus/mass.  AORTIC VALVE:  Trileaflet, mild sclerosis without stenosis. No regurgitation. No vegetation.  MITRAL VALVE:    Normal structure. Trivial regurgitation. No vegetation.  TRICUSPID VALVE: Normal structure. Mild-moderate regurgitation. No vegetation.  PULMONIC VALVE: Grossly normal structure. Trivial regurgitation. No apparent vegetation.  INTERATRIAL SEPTUM: PFO seen with color Doppler with left to right shunting. Agitated saline demonstrates rapid right to left shunting as well.  PERICARDIUM: No effusion noted.  DESCENDING AORTA: Mild diffuse plaque seen  CONCLUSION: PFO present, with both right to left and left to right shunting.   Jodelle Red, MD, PhD [facility]  [address] [place], [place] [address] [phone]   11:20 AM

## 2020-08-17 NOTE — Anesthesia Postprocedure Evaluation (Signed)
Anesthesia Post Note  Patient: Vicki Gonzales  Procedure(s) Performed: TRANSESOPHAGEAL ECHOCARDIOGRAM (TEE) BUBBLE STUDY     Patient location during evaluation: Endoscopy Anesthesia Type: MAC Level of consciousness: awake and alert Pain management: pain level controlled Vital Signs Assessment: post-procedure vital signs reviewed and stable Respiratory status: spontaneous breathing, nonlabored ventilation and respiratory function stable Cardiovascular status: stable and blood pressure returned to baseline Postop Assessment: no apparent nausea or vomiting Anesthetic complications: no   No notable events documented.  Last Vitals:  Vitals:   08/17/20 1135 08/17/20 1145  BP: (!) 120/58 119/71  Pulse: 65 65  Resp: (!) 23   Temp:    SpO2: 96% 96%    Last Pain:  Vitals:   08/17/20 1017  TempSrc:   PainSc: 0-No pain                 Yamel Bale,W. EDMOND

## 2020-08-17 NOTE — Progress Notes (Signed)
  Echocardiogram Echocardiogram Transesophageal has been performed.  Gerda Diss 08/17/2020, 11:43 AM

## 2020-08-17 NOTE — Anesthesia Procedure Notes (Signed)
Procedure Name: MAC Date/Time: 08/17/2020 10:59 AM Performed by: Kathryne Hitch, CRNA Pre-anesthesia Checklist: Patient identified, Emergency Drugs available, Suction available and Patient being monitored Patient Re-evaluated:Patient Re-evaluated prior to induction Oxygen Delivery Method: Nasal cannula Preoxygenation: Pre-oxygenation with 100% oxygen Induction Type: IV induction Dental Injury: Teeth and Oropharynx as per pre-operative assessment

## 2020-08-17 NOTE — Transfer of Care (Signed)
Immediate Anesthesia Transfer of Care Note  Patient: Vicki Gonzales  Procedure(s) Performed: TRANSESOPHAGEAL ECHOCARDIOGRAM (TEE) BUBBLE STUDY  Patient Location: Endoscopy Unit  Anesthesia Type:MAC  Level of Consciousness: drowsy and patient cooperative  Airway & Oxygen Therapy: Patient Spontanous Breathing and Patient connected to nasal cannula oxygen  Post-op Assessment: Report given to RN and Post -op Vital signs reviewed and stable  Post vital signs: Reviewed and stable  Last Vitals:  Vitals Value Taken Time  BP 123/69 08/17/20 1124  Temp    Pulse 64 08/17/20 1125  Resp 22 08/17/20 1125  SpO2 99 % 08/17/20 1125  Vitals shown include unvalidated device data.  Last Pain:  Vitals:   08/17/20 1017  TempSrc:   PainSc: 0-No pain      Patients Stated Pain Goal: 0 (30/94/07 6808)  Complications: No notable events documented.

## 2020-08-17 NOTE — Progress Notes (Signed)
STROKE TEAM PROGRESS NOTE   INTERVAL HISTORY Her husband and RN are at the bedside.  Patient neuro stable, unchanged.  Working with PT/OT and reported improvement of walking.  TEE showed possible PFO.  TCD bubble study showed small PFO likely.  LE venous Doppler negative for DVT.  Vitals:   08/17/20 1400 08/17/20 1500 08/17/20 1600 08/17/20 1941  BP: 121/69 104/90 139/74 138/70  Pulse: 72 70 68 71  Resp: 16 (!) 24 (!) 22 16  Temp:   98.1 F (36.7 C) 98.6 F (37 C)  TempSrc:   Oral Oral  SpO2: 97% 97% 97% 96%  Weight:      Height:       CBC:  Recent Labs  Lab 08/14/20 0340 08/15/20 0536 08/16/20 0416 08/17/20 0403  WBC 10.4 12.6* 11.1* 12.2*  NEUTROABS 7.6 9.4*  --   --   HGB 12.6 11.0* 10.5* 10.2*  HCT 37.1 32.8* 31.9* 30.3*  MCV 85.7 87.2 87.2 86.3  PLT 307 277 276 276   Basic Metabolic Panel:  Recent Labs  Lab 08/16/20 0416 08/17/20 0403  NA 140 140  K 3.3* 3.0*  CL 109 111  CO2 22 22  GLUCOSE 134* 112*  BUN 9 13  CREATININE 0.55 0.58  CALCIUM 8.7* 8.2*   Lipid Panel:  Recent Labs  Lab 08/12/20 1628  CHOL 253*  TRIG 217*  HDL 38*  CHOLHDL 6.7  VLDL 43*  LDLCALC 172*   HgbA1c:  Recent Labs  Lab 08/12/20 1628  HGBA1C 6.2*   Urine Drug Screen:  Recent Labs  Lab 08/12/20 1300  LABOPIA NONE DETECTED  COCAINSCRNUR NONE DETECTED  LABBENZ NONE DETECTED  AMPHETMU NONE DETECTED  THCU NONE DETECTED  LABBARB NONE DETECTED    Alcohol Level  Recent Labs  Lab 08/12/20 1148  ETH <10    IMAGING past 24 hours VAS [place] TRANSCRANIAL DOPPLER W BUBBLES  Result Date: 08/17/2020  Transcranial Doppler with Bubble Patient Name:  Vicki Gonzales Gonzales  Date of Exam:   08/17/2020 Medical Rec #: [id number]                Accession #:    [id number] Date of Birth: 1965/03/07                Patient Gender: F Patient Age:   55 years Exam Location:  [facility] Procedure:      VAS [place] TRANSCRANIAL DOPPLER W BUBBLES Referring Phys: Scheryl Marten Barney Russomanno  --------------------------------------------------------------------------------  Indications: Stroke with PFO. Performing Technologist: [place] Ponder RVS  Examination Guidelines: A complete evaluation includes B-mode imaging, spectral Doppler, color Doppler, and power Doppler as needed of all accessible portions of each vessel. Bilateral testing is considered an integral part of a complete examination. Limited examinations for reoccurring indications may be performed as noted.  Summary:  A vascular evaluation was performed. The right middle cerebral artery was studied. An IV was inserted into the patient's right forearm . Verbal informed consent was obtained.  HITS: [name] grade 1 (< 10) - at rest [name] grade 2 (10-30) - with valsalva Above result indicating a small right to left intracardiac communication. *See table(s) above for TCD measurements and observations.  Diagnosing physician: Marvel Plan MD Electronically signed by Marvel Plan MD on 08/17/2020 at 4:27:12 PM.    Final    ECHO TEE  Result Date: 08/17/2020    TRANSESOPHOGEAL ECHO REPORT   Patient Name:   Vicki Gonzales Gonzales Date of Exam: 08/17/2020 Medical Rec #:  [id number]               Height:       66.0 in Accession #:    [id number]              Weight:       186.3 lb Date of Birth:  [birth year]               BSA:          1.940 m Patient Age:    55 years                BP:           120/58 mmHg Patient Gender: F                       HR:           65 bpm. Exam Location:  Inpatient Procedure: Transesophageal Echo, Cardiac Doppler, Color Doppler and 3D Echo Indications:     Stroke  History:         Patient has prior history of Echocardiogram examinations, most                  recent 08/13/2020. Risk Factors:Hypertension.  Sonographer:     Ross Ludwig RDCS (AE) Referring Phys:  [id number] Cyndi Bender Diagnosing Phys: Jodelle Red MD PROCEDURE: After discussion of the risks and benefits of a TEE, an informed consent was obtained from the patient.  The transesophogeal probe was passed without difficulty through the esophogus of the patient. Local oropharyngeal anesthetic was provided with Cetacaine. Sedation performed by different physician. The patient was monitored while under deep sedation. Anesthestetic sedation was provided intravenously by Anesthesiology: 190.13mg  of Propofol. Image quality was good. The patient's vital signs; including heart rate, blood pressure, and oxygen saturation; remained stable throughout the procedure. The patient developed no complications during the procedure. IMPRESSIONS  1. Left ventricular ejection fraction, by estimation, is 60 to 65%. The left ventricle has normal function.  2. Right ventricular systolic function is normal. The right ventricular size is normal.  3. No left atrial/left atrial appendage thrombus was detected.  4. The mitral valve is normal in structure. Trivial mitral valve regurgitation. No evidence of mitral stenosis.  5. Tricuspid valve regurgitation is mild to moderate.  6. The aortic valve is tricuspid. Aortic valve regurgitation is not visualized. Mild aortic valve sclerosis is present, with no evidence of aortic valve stenosis.  7. There is mild (Grade II) plaque involving the descending aorta.  8. Evidence of atrial level shunting detected by color flow Doppler. Agitated saline contrast bubble study was negative, with no evidence of any interatrial shunt. There is a small patent foramen ovale with bidirectional shunting across atrial septum. Conclusion(s)/Recommendation(s): PFO present, with both right to left and left to right shunting. Communicated with Dr. Roda Shutters, neurology. FINDINGS  Left Ventricle: Left ventricular ejection fraction, by estimation, is 60 to 65%. The left ventricle has normal function. The left ventricular internal cavity size was normal in size. Right Ventricle: The right ventricular size is normal. No increase in right ventricular wall thickness. Right ventricular systolic  function is normal. Left Atrium: Left atrial size was normal in size. No left atrial/left atrial appendage thrombus was detected. Right Atrium: Right atrial size was normal in size. Pericardium: There is no evidence of pericardial effusion. Mitral Valve: The mitral valve is normal in structure. Trivial mitral valve regurgitation. No evidence of mitral valve stenosis. Tricuspid Valve:  The tricuspid valve is normal in structure. Tricuspid valve regurgitation is mild to moderate. No evidence of tricuspid stenosis. Aortic Valve: The aortic valve is tricuspid. Aortic valve regurgitation is not visualized. Mild aortic valve sclerosis is present, with no evidence of aortic valve stenosis. Pulmonic Valve: The pulmonic valve was grossly normal. Pulmonic valve regurgitation is not visualized. No evidence of pulmonic stenosis. Aorta: The aortic root is normal in size and structure. There is mild (Grade II) plaque involving the descending aorta. IAS/Shunts: Evidence of atrial level shunting detected by color flow Doppler. Agitated saline contrast was given intravenously to evaluate for intracardiac shunting. Agitated saline contrast bubble study was negative, with no evidence of any interatrial shunt. A small patent foramen ovale is detected with bidirectional shunting across atrial septum.  TRICUSPID VALVE TR Peak grad:   34.6 mmHg TR Vmax:        294.00 cm/s Jodelle Red MD Electronically signed by Jodelle Red MD Signature Date/Time: 08/17/2020/2:25:52 PM    Final    VAS [place] LOWER EXTREMITY VENOUS (DVT)  Result Date: 08/17/2020  Lower Venous DVT Study Patient Name:  BLU MCGLAUN  Date of Exam:   08/17/2020 Medical Rec #: [id number]                Accession #:    [id number] Date of Birth: 1965-12-21                Patient Gender: F Patient Age:   45 years Exam Location:  [facility] Procedure:      VAS [place] LOWER EXTREMITY VENOUS (DVT) Referring Phys: Scheryl Marten Shishir Krantz  --------------------------------------------------------------------------------  Indications: Stroke with pfo.  Comparison Study: no prior Performing Technologist: [place] Ponder RVS  Examination Guidelines: A complete evaluation includes B-mode imaging, spectral Doppler, color Doppler, and power Doppler as needed of all accessible portions of each vessel. Bilateral testing is considered an integral part of a complete examination. Limited examinations for reoccurring indications may be performed as noted. The reflux portion of the exam is performed with the patient in reverse Trendelenburg.  +---------+---------------+---------+-----------+----------+-------------------+ RIGHT    CompressibilityPhasicitySpontaneityPropertiesThrombus Aging      +---------+---------------+---------+-----------+----------+-------------------+ CFV                     Yes      Yes                  pt unable to                                                              tolerate                                                                  compression         +---------+---------------+---------+-----------+----------+-------------------+ SFJ  Not well visualized +---------+---------------+---------+-----------+----------+-------------------+ FV Prox  Full                                                             +---------+---------------+---------+-----------+----------+-------------------+ FV Mid   Full                                                             +---------+---------------+---------+-----------+----------+-------------------+ FV DistalFull                                                             +---------+---------------+---------+-----------+----------+-------------------+ PFV      Full                                                              +---------+---------------+---------+-----------+----------+-------------------+ POP      Full           Yes      Yes                                      +---------+---------------+---------+-----------+----------+-------------------+ PTV      Full                                                             +---------+---------------+---------+-----------+----------+-------------------+ PERO     Full                                                             +---------+---------------+---------+-----------+----------+-------------------+   +---------+---------------+---------+-----------+----------+--------------+ LEFT     CompressibilityPhasicitySpontaneityPropertiesThrombus Aging +---------+---------------+---------+-----------+----------+--------------+ CFV      Full           Yes      Yes                                 +---------+---------------+---------+-----------+----------+--------------+ SFJ      Full                                                        +---------+---------------+---------+-----------+----------+--------------+ FV Prox  Full                                                        +---------+---------------+---------+-----------+----------+--------------+  FV Mid   Full                                                        +---------+---------------+---------+-----------+----------+--------------+ FV DistalFull                                                        +---------+---------------+---------+-----------+----------+--------------+ PFV      Full                                                        +---------+---------------+---------+-----------+----------+--------------+ POP      Full           Yes      Yes                                 +---------+---------------+---------+-----------+----------+--------------+ PTV      Full                                                         +---------+---------------+---------+-----------+----------+--------------+ PERO     Full                                                        +---------+---------------+---------+-----------+----------+--------------+     Summary: BILATERAL: - No evidence of deep vein thrombosis seen in the lower extremities, bilaterally. -No evidence of popliteal cyst, bilaterally.   *See table(s) above for measurements and observations.    Preliminary     PHYSICAL EXAM  Temp:  [98 F (36.7 C)-98.7 F (37.1 C)] 98.6 F (37 C) (08/12 1941) Pulse Rate:  [64-78] 71 (08/12 1941) Resp:  [15-28] 16 (08/12 1941) BP: (97-147)/(38-90) 138/70 (08/12 1941) SpO2:  [89 %-100 %] 96 % (08/12 1941) Weight:  [84.5 kg] 84.5 kg (08/12 1017)  General - Well nourished, well developed, in no apparent distress.  Ophthalmologic - fundi not visualized due to noncooperation.  Cardiovascular - Regular rhythm and rate.  Mental Status -  Sleepy but easily arousable. Orientatation to time, place, and person were intact. Language including expression, naming, repetition, comprehension was assessed and found intact.  Cranial Nerves II - XII - II - Visual field intact OU. III, IV, VI - Extraocular movements intact. V - Facial sensation intact bilaterally. VII - facial symmetrical VIII - Hearing & vestibular intact bilaterally. X - Palate elevates symmetrically. XI - Chin turning & shoulder shrug intact bilaterally. XII - Tongue protrusion intact.  Motor Strength - The patient's strength was normal in all extremities. Bulk was normal and fasciculations were absent.   Motor Tone - Muscle tone was assessed at  the neck and appendages and was normal.  Reflexes - The patient's reflexes were symmetrical in all extremities and she had no pathological reflexes.  Sensory - Light touch, temperature/pinprick were assessed and showed left upper extremity sensory loss, and left lower extremities decreased light touch. Left  sensory neglect  Coordination - The patient had normal movements in the right hand with no ataxia or dysmetria.  However, left FTN slow but not ataxic anymore. Tremor was absent.  Gait and Station - deferred.   ASSESSMENT/PLAN [name] is a 55 y.o. female with history of HTN, migraine presenting with left facial droop and slurry speech.   Stroke:  right MCA infarct due to right ICA occlusion s/p tPA complicated with small SAH and later right ICA stenting, etiology unclear CT head code stroke Acute infarct in the right insula and frontal operculum, ASPECTS is 8. CTA head and neck - right ICA occlusion and right M1/M2 non-occlusive clot vs. High grade stenosis Serial CT Head stable small SAH right frontal. MRI SWI only - Positive for small volume subarachnoid hemorrhage. MRI: repeat: right MCA multifocal moderate sized infarcts, stable trace SAH right frontal.  MRA  occlusion of right ICA reconstitution at ophthalmic segment. Occlusion of right M3.  Left VA occlusion, age indeterminate 8/9 s/p right ICA revascularization and stent 8/10 CUS right ICA stent patent 2D Echo EF 60-65% TEE showed normal EF but possible PFO TCD bubble study showed [name] degree II with Valsalva LE venous Doppler no DVT Will consider 30 day cardiac event monitoring as outpt LDL 172 HgbA1c 6.2 Hypercoagulable labs so far negative, some are still pending VTE prophylaxis - lovenox No antithrombotic prior to admission, now on ASA 81 and Brilinta in after right ICA revascularization and stenting Therapy recommendations:  CIR Disposition:  pending  Trace right frontal SAH post tPA Fibrinogen level 248 Serial CT stable SAH No reversal needed MRI showed trace SAH right frontal, unchanged  Right ICA occlusion Discussing with Dr. Corliss Skainseveshwar  S/p revascularization and stenting of RICA 8/9 On aspirin and brilinta   Right femoral artery pseudoaneurysm Post op right groin hematoma Doppler  showed small 2.3 x 2 pseudoaneurysm Vascular surgery consulted -> s/p repair Doing well, off the bed  PFO TEE showed possible PFO TCD bubble study showed [name] degree 1 at rest, degree 2 with Valsalva, indicating a small PFO Rope score = 6 Will refer to Dr. Excell Seltzerooper cardiology for further evaluation  Orthostatic hypotension Patient new or worsening after sitting up at bedside commode, BP down to 80s, put back in bed, symptoms improving 1 L normal saline bolus stat 8/8 Albumin every 6 hours for 4 doses 8/8 Avoid low BP Stable now  Hypertension Home meds:  zestoretic Stable BP goal 120-160 s/p right ICA revasc Long-term BP goal normotensive  Hyperlipidemia Home meds:  none LDL 172, goal < 70 Add atorvastatin 80 mg daily  Continue statin at discharge  Other Stroke Risk Factors Former cigarette smoker, advised to stay a non-smoker ETOH use, alcohol level <10, advised to drink no more than 1 drink a day Migraines Probable OSA - need outpt follow up   Hospital day # 5  Marvel PlanJindong Presleigh Feldstein, MD PhD Stroke Neurology 08/17/2020 7:48 PM  To contact Stroke Continuity provider, please refer to [email or web address]. After hours, contact General Neurology

## 2020-08-17 NOTE — Progress Notes (Signed)
Occupational Therapy Treatment Patient Details Name: Vicki Gonzales MRN: [id number] DOB: 04-07-1965 Today's Date: 08/17/2020    History of present illness Pt is a 55 y.o. F who presents with dysarthric speech and left facial droop. Found to have a developing R MCA stroke with an ASPECTS of 8 and a R MCA bifurcation subocclusive thrombus vs stenosis. Received tPA. She reported headache on arrival to [facility] and MRI SWI sequences confirmed a small volume R frontal SAH. Thrombectomy was not pursued and tPA was not reversed immediately due to concern for progression of R MCA stroke. Pt developed Rt femoral  pseudoaneurysm8/11  and underwent repair  Significant PMH: HTN, migraines.   OT comments  Pt demonstrates improved attention this date as well as improved ability to attend to Lt.  She is able to perform ADLs with supervision - mod A, and min - mod A for functional transfers.  Less pushing noted today.  Recommend CIR.   Follow Up Recommendations  CIR    Equipment Recommendations  None recommended by OT    Recommendations for Other Services Rehab consult    Precautions / Restrictions Precautions Precautions: Fall;Other (comment) Precaution Comments: L inattention, hemiplegia, SBP < 140 Restrictions Weight Bearing Restrictions: No       Mobility Bed Mobility Overal bed mobility: Needs Assistance Bed Mobility: Supine to Sit;Sit to Supine     Supine to sit: Min guard Sit to supine: Min guard   General bed mobility comments: min guard assist for safety    Transfers                      Balance Overall balance assessment: Needs assistance Sitting-balance support: Feet supported Sitting balance-Leahy Scale: Fair     Standing balance support: Single extremity supported;During functional activity Standing balance-Leahy Scale: Poor Standing balance comment: Pt requires UE support.  Less pushing noted this date                           ADL  either performed or assessed with clinical judgement   ADL Overall ADL's : Needs assistance/impaired     Grooming: Wash/dry hands;Wash/dry face;Brushing hair;Supervision/safety;Sitting               Lower Body Dressing: Moderate assistance;Sit to/from stand Lower Body Dressing Details (indicate cue type and reason): able to don socks EOB, but requires assist to pull pants over hips and assist for balance Toilet Transfer: Moderate assistance;Stand-pivot;BSC           Functional mobility during ADLs: Moderate assistance       Vision   Additional Comments: Pt continues with Lt gaze preference, but will spontaneously turn head to locate iems on her Lt   Perception     Praxis      Cognition Arousal/Alertness: Awake/alert Behavior During Therapy: Flat affect;Impulsive;Restless Overall Cognitive Status: Impaired/Different from baseline Area of Impairment: Attention;Following commands;Safety/judgement;Awareness;Problem solving                   Current Attention Level: Sustained;Selective   Following Commands: Follows one step commands consistently;Follows multi-step commands inconsistently Safety/Judgement: Decreased awareness of safety;Decreased awareness of deficits Awareness: Emergent Problem Solving: Difficulty sequencing;Requires verbal cues;Requires tactile cues General Comments: Pt continues to be distractable, but is able to sustain attention to familiar tasks for up to 3 mins and is able to start to selectively attend. Distracted by Rt groin pain and fatigue.  She is demonstrating  emerging awareness of her deficits and how they may impact her function        Exercises     Shoulder Instructions       General Comments      Pertinent Vitals/ Pain       Pain Assessment: Faces Faces Pain Scale: Hurts little more Pain Location: Rt groin Pain Descriptors / Indicators: Discomfort;Guarding;Grimacing Pain Intervention(s): Monitored during session;Limited  activity within patient's tolerance;Repositioned  Home Living                                          Prior Functioning/Environment              Frequency  Min 2X/week        Progress Toward Goals  OT Goals(current goals can now be found in the care plan section)  Progress towards OT goals: Not progressing toward goals - comment     Plan Discharge plan remains appropriate    Co-evaluation                 AM-PAC OT "6 Clicks" Daily Activity     Outcome Measure   Help from another person eating meals?: None Help from another person taking care of personal grooming?: A Little Help from another person toileting, which includes using toliet, bedpan, or urinal?: A Lot Help from another person bathing (including washing, rinsing, drying)?: A Little Help from another person to put on and taking off regular upper body clothing?: A Little Help from another person to put on and taking off regular lower body clothing?: A Lot 6 Click Score: 17    End of Session Equipment Utilized During Treatment: Gait belt  OT Visit Diagnosis: Unsteadiness on feet (R26.81);Cognitive communication deficit (R41.841) Symptoms and signs involving cognitive functions: Cerebral infarction   Activity Tolerance Patient tolerated treatment well   Patient Left in bed;with call bell/phone within reach;with bed alarm set   Nurse Communication Mobility status        Time: 8889-1694 OT Time Calculation (min): 21 min  Charges: OT General Charges $OT Visit: 1 Visit OT Treatments $Self Care/Home Management : 8-22 mins  [name] OTR/L Acute Rehabilitation Services Pager [phone] Office [phone]    [name] 08/17/2020, 10:37 AM

## 2020-08-17 NOTE — Progress Notes (Signed)
[facility] IP rehab admissions - I met with patient and her husband at the bedside.  I also spoke with the RN. Did well with therapies yesterday.  May be ready to open the insurance case on Monday to request for acute inpatient rehab admission.  I will have my partner follow up on Monday.  Call for questions.  [phone]

## 2020-08-17 NOTE — Interval H&P Note (Signed)
History and Physical Interval Note:  08/17/2020 10:43 AM  Vicki Gonzales  has presented today for surgery, with the diagnosis of STROKE.  The various methods of treatment have been discussed with the patient and family. After consideration of risks, benefits and other options for treatment, the patient has consented to  Procedure(s): TRANSESOPHAGEAL ECHOCARDIOGRAM (TEE) (N/A) as a surgical intervention.  The patient's history has been reviewed, patient examined, no change in status, stable for surgery.  I have reviewed the patient's chart and labs.  Questions were answered to the patient's satisfaction.     [name] Cristal Deer

## 2020-08-18 LAB — BETA-2-GLYCOPROTEIN I ABS, IGG/M/A
Beta-2 Glyco I IgG: <9 GPI IgG units (ref 0–20)
Beta-2-Glycoprotein I IgA: <9 GPI IgA units (ref 0–25)
Beta-2-Glycoprotein I IgM: <9 GPI IgM units (ref 0–32)

## 2020-08-18 LAB — LUPUS ANTICOAGULANT PANEL
DRVVT: 36 s (ref 0.0–47.0)
PTT Lupus Anticoagulant: 36.3 s (ref 0.0–51.9)

## 2020-08-18 NOTE — Care Plan (Signed)
Patient is alert oriented x 4. +1 assist, pivot to bedside commode. Pt c/o pain to right groin from procedure, prn tramadol given. Pain returned before next due dose last night. Provider paged and received order to give dose early. Pt rested well, completed NIH.

## 2020-08-18 NOTE — Progress Notes (Signed)
Pt has scds on.

## 2020-08-18 NOTE — Progress Notes (Signed)
Physical Therapy Treatment Patient Details Name: Vicki Gonzales MRN: [id number] DOB: 09/19/1965 Today's Date: 08/18/2020    History of Present Illness Pt is a 55 y.o. F who presents with dysarthric speech and left facial droop. Found to have a developing R MCA stroke with an ASPECTS of 8 and a R MCA bifurcation subocclusive thrombus vs stenosis. Received tPA. She reported headache on arrival to [facility] and [facility] and MRI SWI sequences confirmed a small volume R frontal SAH. Thrombectomy was not pursued and tPA was not reversed immediately due to concern for progression of R MCA stroke. Pt developed Rt femoral  pseudoaneurysm8/11  and underwent repair  Significant PMH: HTN, migraines.    PT Comments    Pt in bed on arrival and agreeable to participate with therapy. Pt progressed OOB to [facility], however was unable to void. She performed short distance gait in room. Pt is making steady progress towards goals and required less assist this session. Will continue to follow acutely.    Follow Up Recommendations  CIR     Equipment Recommendations  Other (comment) (defer to post acute)    Recommendations for Other Services Rehab consult     Precautions / Restrictions Precautions Precautions: Fall;Other (comment) Precaution Comments: L inattention, hemiplegia, SBP < 140    Mobility  Bed Mobility Overal bed mobility: Needs Assistance Bed Mobility: Sit to Supine;Supine to Sit Rolling: Min guard Sidelying to sit: Min guard Supine to sit: Supervision Sit to supine: Supervision Sit to sidelying: Min guard General bed mobility comments: No physical assist or cues required. HOB slightly elevated.    Transfers Overall transfer level: Needs assistance Equipment used: Rolling walker (2 wheeled) Transfers: Sit to/from [employer] Sit to Stand: Min assist Stand pivot transfers: Min assist       General transfer comment: min A to rise and transfer to  [facility].  Ambulation/Gait Ambulation/Gait assistance: Min assist Gait Distance (Feet): 20 Feet Assistive device: Rolling walker (2 wheeled) Gait Pattern/deviations: Step-through pattern;Step-to pattern;Shuffle;Decreased stride length;Staggering left;Staggering right Gait velocity: decreased   General Gait Details: min A for balance, specifically with turning. Pt able to ambulate to door and back. However fatigues quickly. VC for postural control and RW proximity.   Stairs             Wheelchair Mobility    Modified Rankin (Stroke Patients Only) Modified Rankin (Stroke Patients Only) Pre-Morbid Rankin Score: No symptoms Modified Rankin: Moderately severe disability     Balance Overall balance assessment: Needs assistance Sitting-balance support: Feet supported Sitting balance-Leahy Scale: Fair Sitting balance - Comments: reliant on UE support and up to min A due to posterior lean and being easily distracted   Standing balance support: Bilateral upper extremity supported;During functional activity Standing balance-Leahy Scale: Poor Standing balance comment: Pt requires UE support.  Less pushing noted this date                            Cognition Arousal/Alertness: Awake/alert Behavior During Therapy: WFL for tasks assessed/performed Overall Cognitive Status: Impaired/Different from baseline                                 General Comments: Not formally assessed, but seems much improved from previous sessions.      Exercises      General Comments General comments (skin integrity, edema, etc.): husband present for session  Pertinent Vitals/Pain Pain Assessment: Faces Faces Pain Scale: Hurts a little bit Pain Location: Rt groin Pain Descriptors / Indicators: Discomfort;Guarding;Grimacing Pain Intervention(s): Monitored during session;Limited activity within patient's tolerance    Home Living                      Prior  Function            PT Goals (current goals can now be found in the care plan section) Acute Rehab PT Goals Patient Stated Goal: to take a nap PT Goal Formulation: With patient Time For Goal Achievement: 08/27/20 Potential to Achieve Goals: Good Progress towards PT goals: Progressing toward goals    Frequency    Min 4X/week      PT Plan Current plan remains appropriate    Co-evaluation              AM-PAC PT "6 Clicks" Mobility   Outcome Measure  Help needed turning from your back to your side while in a flat bed without using bedrails?: A Little Help needed moving from lying on your back to sitting on the side of a flat bed without using bedrails?: A Lot Help needed moving to and from a bed to a chair (including a wheelchair)?: A Lot Help needed standing up from a chair using your arms (e.g., wheelchair or bedside chair)?: A Lot Help needed to walk in hospital room?: A Lot Help needed climbing 3-5 steps with a railing? : Total 6 Click Score: 12    End of Session Equipment Utilized During Treatment: Gait belt Activity Tolerance: Patient tolerated treatment well Patient left: in bed;with call bell/phone within reach;with bed alarm set;with nursing/sitter in room Nurse Communication: Mobility status PT Visit Diagnosis: Unsteadiness on feet (R26.81);Difficulty in walking, not elsewhere classified (R26.2);Hemiplegia and hemiparesis Hemiplegia - Right/Left: Left Hemiplegia - dominant/non-dominant: Non-dominant Hemiplegia - caused by: Cerebral infarction;Nontraumatic SAH     Time: 4982-6415 PT Time Calculation (min) (ACUTE ONLY): 17 min  Charges:  $Gait Training: 8-22 mins                    Kallie Locks, [place] Pager [phone] Acute Rehab  Sheral Apley 08/18/2020, 3:21 PM

## 2020-08-18 NOTE — Progress Notes (Signed)
Occupational Therapy Treatment Patient Details Name: Vicki Gonzales MRN: [id number] DOB: [birth year] Today's Date: 08/18/2020    History of present illness Pt is a 55 y.o. F who presents with dysarthric speech and left facial droop. Found to have a developing R MCA stroke with an ASPECTS of 8 and a R MCA bifurcation subocclusive thrombus vs stenosis. Received tPA. She reported headache on arrival to [facility] and [facility] and MRI SWI sequences confirmed a small volume R frontal SAH. Thrombectomy was not pursued and tPA was not reversed immediately due to concern for progression of R MCA stroke. Pt developed Rt femoral  pseudoaneurysm8/11  and underwent repair  Significant PMH: HTN, migraines.   OT comments  Pt. Seen for skilled OT treatment.  Reports completing ub/lb bathing at sink prior to my arrival.  Bed mobility to R with min a.  Initial sit/stand mod a.  Pt. Aware of L side deficits and states "I know I have to take my time".  Requires some cues intermittently for rw management and safety.  Reviewed importance of incorporating use of L hand ue and digits into functional use during adls.  Pt. Requesting squeeze ball.  Provided squeeze ball along with theraputty and handouts with instructions for use along with other examples.  Pts. Spouse and son present for most of session and are active in her care.  All are eager for continued therapies with CIR.  Pt. Remains excellent candidate for higher level therapies to ensure increased independence and safety prior to home.    Follow Up Recommendations  CIR    Equipment Recommendations  None recommended by OT    Recommendations for Other Services Rehab consult    Precautions / Restrictions Precautions Precautions: Fall;Other (comment) Precaution Comments: L inattention, hemiplegia, SBP < 140       Mobility Bed Mobility Overal bed mobility: Needs Assistance Bed Mobility: Rolling;Sidelying to Sit;Sit to Sidelying Rolling: Min guard Sidelying  to sit: Min guard     Sit to sidelying: Min guard General bed mobility comments: Rolled to R to simulate home environment.  assistance required for sequencing and also secondary to R groin pain    Transfers Overall transfer level: Needs assistance Equipment used: Rolling walker (2 wheeled) Transfers: Sit to/from [employer] Sit to Stand: Mod assist Stand pivot transfers: Mod assist       General transfer comment: mod a for sit/stand. initial cues for RW management.  states she did hand held A with therapist yesterday but requested the RW today for steadiness.  cues for safe hand placement during toilet transfer.  reviewed safe vs. un safe furniture walking and what to hold onto.    Balance                                           ADL either performed or assessed with clinical judgement   ADL Overall ADL's : Needs assistance/impaired                         Toilet Transfer: Moderate assistance;RW;Regular Toilet;Grab bars;Ambulation Toilet Transfer Details (indicate cue type and reason): cues for rw management but once instructed was able to maintain.           General ADL Comments: reviewed ways to incorporate use of L hand/ue during all adls.  pt. requests squeeze ball. provided with inst. for  use along with theraputty and several examples of ways to use and also using household items to continue strengthening and functional use of L hand and digits.  pts. son and husband present. very active in her care.     Vision       Perception     Praxis      Cognition Arousal/Alertness: Awake/alert Behavior During Therapy: WFL for tasks assessed/performed                                            Exercises     Shoulder Instructions       General Comments  Has 5 children and a grandson who is 2 that she enjoyed talking about.    Pertinent Vitals/ Pain       Pain Assessment: No/denies pain  Home Living                                           Prior Functioning/Environment              Frequency  Min 2X/week        Progress Toward Goals  OT Goals(current goals can now be found in the care plan section)  Progress towards OT goals: Progressing toward goals     Plan Discharge plan remains appropriate    Co-evaluation                 AM-PAC OT "6 Clicks" Daily Activity     Outcome Measure   Help from another person eating meals?: None Help from another person taking care of personal grooming?: A Little Help from another person toileting, which includes using toliet, bedpan, or urinal?: A Lot Help from another person bathing (including washing, rinsing, drying)?: A Little Help from another person to put on and taking off regular upper body clothing?: A Little Help from another person to put on and taking off regular lower body clothing?: A Lot 6 Click Score: 17    End of Session Equipment Utilized During Treatment: Gait belt;Rolling walker  OT Visit Diagnosis: Unsteadiness on feet (R26.81);Cognitive communication deficit (R41.841) Symptoms and signs involving cognitive functions: Cerebral infarction   Activity Tolerance Patient tolerated treatment well   Patient Left in bed;with call bell/phone within reach;with bed alarm set;with family/visitor present   Nurse Communication          Time: 9528-4132 OT Time Calculation (min): 27 min  Charges: OT General Charges $OT Visit: 1 Visit OT Treatments $Self Care/Home Management : 23-37 mins  Boneta Lucks, COTA/L Acute Rehabilitation [phone]    Vicki Gonzales 08/18/2020, 1:53 PM

## 2020-08-18 NOTE — Progress Notes (Signed)
Pt states is not obtaining pain relief from oxycodone.  "I expected it not to hurt anymore!"  It was explained that spinal surgery, while often effective, takes time.  Is requesting "Dilaud-a" for pain.  Patient did manage to sleep for brief periods after 10 mg oxycodone given for pain.

## 2020-08-18 NOTE — Progress Notes (Signed)
STROKE TEAM PROGRESS NOTE   INTERVAL HISTORY Her husband is at the bedside.  Improving and doing better everyday. She tells me she has severe snoring and husband describes apena when she sleeps.   Vitals:   08/17/20 2319 08/18/20 0334 08/18/20 0735 08/18/20 1147  BP: 132/70 128/81 (!) 157/78 139/67  Pulse: 65 65 67 65  Resp: 18 17 18 18   Temp: 97.8 F (36.6 C) 97.6 F (36.4 C) 98.5 F (36.9 C)   TempSrc: Oral Oral Oral   SpO2: 97% 97% 100% 98%  Weight:      Height:       CBC:  Recent Labs  Lab 08/14/20 0340 08/15/20 0536 08/16/20 0416 08/17/20 0403  WBC 10.4 12.6* 11.1* 12.2*  NEUTROABS 7.6 9.4*  --   --   HGB 12.6 11.0* 10.5* 10.2*  HCT 37.1 32.8* 31.9* 30.3*  MCV 85.7 87.2 87.2 86.3  PLT 307 277 276 276   Basic Metabolic Panel:  Recent Labs  Lab 08/16/20 0416 08/17/20 0403  NA 140 140  K 3.3* 3.0*  CL 109 111  CO2 22 22  GLUCOSE 134* 112*  BUN 9 13  CREATININE 0.55 0.58  CALCIUM 8.7* 8.2*   Lipid Panel:  Recent Labs  Lab 08/12/20 1628  CHOL 253*  TRIG 217*  HDL 38*  CHOLHDL 6.7  VLDL 43*  LDLCALC 172*   HgbA1c:  Recent Labs  Lab 08/12/20 1628  HGBA1C 6.2*   Urine Drug Screen:  Recent Labs  Lab 08/12/20 1300  LABOPIA NONE DETECTED  COCAINSCRNUR NONE DETECTED  LABBENZ NONE DETECTED  AMPHETMU NONE DETECTED  THCU NONE DETECTED  LABBARB NONE DETECTED    Alcohol Level  Recent Labs  Lab 08/12/20 1148  ETH <10    IMAGING past 24 hours No results found.  PHYSICAL EXAM  Temp:  [97.6 F (36.4 C)-98.6 F (37 C)] 98.5 F (36.9 C) (08/13 0735) Pulse Rate:  [65-71] 65 (08/13 1147) Resp:  [16-22] 18 (08/13 1147) BP: (128-157)/(67-81) 139/67 (08/13 1147) SpO2:  [96 %-100 %] 98 % (08/13 1147)  General - Well nourished, well developed, in no apparent distress.  Ophthalmologic - fundi not visualized due to noncooperation.  Cardiovascular - Regular rhythm and rate.  Mental Status -  Alert. Oriented x 3. Language including  expression, naming, repetition, comprehension was assessed and found intact. No aphasia.  Cranial Nerves II - XII - II - Visual field intact OU. III, IV, VI - Extraocular movements intact. V - Facial sensation intact bilaterally. VII - facial symmetrical VIII - Hearing & vestibular intact bilaterally. X - Palate elevates symmetrically. XII - Tongue protrusion intact.  Motor Strength - The patient's strength was normal in all extremities. Bulk was normal and fasciculations were absent.     Reflexes - The patient's reflexes were symmetrical in all extremities and she had no pathological reflexes.  Sensory - Light touch, temperaturewere assessed and showed left upper extremity reduced to light touch, and left lower extremities decreased light touch and and neglect to double stimulation.   Coordination - The patient had normal movements in the right hand with no ataxia or dysmetria.  However, left FTN slow but not ataxic anymore. Tremor was absent.  Gait and Station - deferred.   ASSESSMENT/PLAN [name] is a 55 y.o. female with history of HTN, migraine presenting with left facial droop and slurry speech.   Stroke:  right MCA infarct due to right ICA occlusion s/p tPA complicated with small  SAH and later right ICA stenting, etiology unclear CT head code stroke Acute infarct in the right insula and frontal operculum, ASPECTS is 8. CTA head and neck - right ICA occlusion and right M1/M2 non-occlusive clot vs. High grade stenosis Serial CT Head stable small SAH right frontal. MRI SWI only - Positive for small volume subarachnoid hemorrhage. MRI: repeat: right MCA multifocal moderate sized infarcts, stable trace SAH right frontal.  MRA  occlusion of right ICA reconstitution at ophthalmic segment. Occlusion of right M3.  Left VA occlusion, age indeterminate 8/9 s/p right ICA revascularization and stent 8/10 CUS right ICA stent patent 2D Echo EF 60-65% TEE showed normal  EF but possible PFO TCD bubble study showed [name] degree II with Valsalva LE venous Doppler no DVT Will consider 30 day cardiac event monitoring as outpt LDL 172 HgbA1c 6.2 Hypercoagulable labs so far negative, some are still pending VTE prophylaxis - lovenox No antithrombotic prior to admission, now on ASA 81 and Brilinta in after right ICA revascularization and stenting Therapy recommendations:  CIR Disposition:  pending Discussed at length about stroke prevention including diet, exercise, HTN mgt. She likely has sleep apnea with is a significant stroke risk factor.  Advised that she will need sleep study outpt after discharge to get this addressed.  Trace right frontal SAH post tPA Fibrinogen level 248 Serial CT stable SAH No reversal needed MRI showed trace SAH right frontal, unchanged  Right ICA occlusion Per Dr. Corliss Skains  S/p revascularization and stenting of RICA 8/9 On aspirin and brilinta   Right femoral artery pseudoaneurysm Post op right groin hematoma Doppler showed small 2.3 x 2 pseudoaneurysm Vascular surgery consulted -> s/p repair Doing well, off the bed  PFO TEE showed possible PFO TCD bubble study showed [name] degree 1 at rest, degree 2 with Valsalva, indicating a small PFO Rope score = 6 Will refer to Dr. Excell Seltzer cardiology for further evaluation outpt.  Orthostatic hypotension Patient new or worsening after sitting up at bedside commode, BP down to 80s, put back in bed, symptoms improving 1 L normal saline bolus stat 8/8 Albumin every 6 hours for 4 doses 8/8 Avoid low BP Stable now  Hypertension Home meds:  zestoretic Stable BP goal 120-160 s/p right ICA revasc Long-term BP goal normotensive  Hyperlipidemia Home meds:  none LDL 172, goal < 70 Add atorvastatin 80 mg daily  Continue statin at discharge  Other Stroke Risk Factors Former cigarette smoker, advised to stay a non-smoker.  Discussed at length to avoid smoking. ETOH use, alcohol  level <10, advised to drink no more than 1 drink a day Migraines Probable OSA - need outpt follow up   Hospital day # 6  spend in discussion of stroke risk factors/reviewing chart and updating on diagnosis and counseling.   [name], MD  08/18/2020 3:59 PM  To contact Stroke Continuity provider, please refer to [email or web address]. After hours, contact General Neurology

## 2020-08-19 NOTE — Progress Notes (Signed)
    Subjective  - POD #4  No complaints this am   Physical Exam:  Right groin incision healing nicely Right groin ecchymosis       Assessment/Plan:  POD #4  No further vascular issues Activity as tolerated Keep right groin clean and dry Will not actively follow, please call with further questions  Wells Korrina Zern 08/19/2020 11:03 AM --  Vitals:   08/19/20 0735 08/19/20 0825  BP: (!) 170/78 140/80  Pulse: 70 69  Resp: 16   Temp: 98.4 F (36.9 C)   SpO2: 95%     Intake/Output Summary (Last 24 hours) at 08/19/2020 1103 Last data filed at 08/18/2020 2000 Gross per 24 hour  Intake 425.19 ml  Output 300 ml  Net 125.19 ml     Laboratory CBC    Component Value Date/Time   WBC 12.2 (H) 08/17/2020 0403   HGB 10.2 (L) 08/17/2020 0403   HCT 30.3 (L) 08/17/2020 0403   PLT 276 08/17/2020 0403    BMET    Component Value Date/Time   NA 140 08/17/2020 0403   K 3.0 (L) 08/17/2020 0403   CL 111 08/17/2020 0403   CO2 22 08/17/2020 0403   GLUCOSE 112 (H) 08/17/2020 0403   BUN 13 08/17/2020 0403   CREATININE 0.58 08/17/2020 0403   CALCIUM 8.2 (L) 08/17/2020 0403   GFRNONAA >60 08/17/2020 0403    COAG Lab Results  Component Value Date   INR 1.0 08/14/2020   INR 1.0 08/12/2020   No results found for: PTT  Antibiotics Anti-infectives (From admission, onward)    Start     Dose/Rate Route Frequency Ordered Stop   08/15/20 1720  ceFAZolin (ANCEF) 2-4 GM/100ML-% IVPB       Note to Pharmacy: [facility]   : cabinet override      08/15/20 1720 08/16/20 0529   08/14/20 0800  vancomycin (VANCOCIN) IVPB 1000 mg/200 mL premix        1,000 mg 200 mL/hr over 60 Minutes Intravenous  Once 08/13/20 1551 08/14/20 0845        V. Charlena Cross, M.D., [facility] [place] Office: [phone] Pager:  [phone]

## 2020-08-19 NOTE — Progress Notes (Signed)
Patient ID: Vicki Gonzales, female   DOB: [birth year], 55 y.o.   MRN: [id number] STROKE TEAM PROGRESS NOTE   INTERVAL HISTORY Her husband is at the bedside.  Improving and doing better everyday. No new events. She is tired from not sleeping well while inpt.  Vitals:   08/19/20 0735 08/19/20 0825 08/19/20 1118 08/19/20 1519  BP: (!) 170/78 140/80 (!) 153/84 138/62  Pulse: 70 69 67 68  Resp: 16  18 18   Temp: 98.4 F (36.9 C)  98.2 F (36.8 C) 98 F (36.7 C)  TempSrc:   Oral Oral  SpO2: 95%  98% 97%  Weight:      Height:       CBC:  Recent Labs  Lab 08/14/20 0340 08/15/20 0536 08/16/20 0416 08/17/20 0403  WBC 10.4 12.6* 11.1* 12.2*  NEUTROABS 7.6 9.4*  --   --   HGB 12.6 11.0* 10.5* 10.2*  HCT 37.1 32.8* 31.9* 30.3*  MCV 85.7 87.2 87.2 86.3  PLT 307 277 276 276   Basic Metabolic Panel:  Recent Labs  Lab 08/16/20 0416 08/17/20 0403  NA 140 140  K 3.3* 3.0*  CL 109 111  CO2 22 22  GLUCOSE 134* 112*  BUN 9 13  CREATININE 0.55 0.58  CALCIUM 8.7* 8.2*   Lipid Panel:  Recent Labs  Lab 08/12/20 1628  CHOL 253*  TRIG 217*  HDL 38*  CHOLHDL 6.7  VLDL 43*  LDLCALC 172*   HgbA1c:  Recent Labs  Lab 08/12/20 1628  HGBA1C 6.2*   Urine Drug Screen:  No results for input(s): LABOPIA, COCAINSCRNUR, LABBENZ, AMPHETMU, THCU, LABBARB in the last 168 hours.   Alcohol Level  No results for input(s): ETH in the last 168 hours.   IMAGING past 24 hours No results found.  PHYSICAL EXAM  Temp:  [98 F (36.7 C)-98.5 F (36.9 C)] 98 F (36.7 C) (08/14 1519) Pulse Rate:  [62-72] 68 (08/14 1519) Resp:  [16-20] 18 (08/14 1519) BP: (126-170)/(62-84) 138/62 (08/14 1519) SpO2:  [95 %-99 %] 97 % (08/14 1519)  General - Well nourished, well developed, in no apparent distress.  Ophthalmologic - fundi not visualized due to noncooperation.  Cardiovascular - Regular rhythm and rate.  Mental Status -  Alert. Oriented x 3. Language including expression, naming,  repetition, comprehension was assessed and found intact. No aphasia.  Cranial Nerves II - XII - II - Visual field intact OU. III, IV, VI - Extraocular movements intact. V - Facial sensation intact bilaterally. VII - facial symmetrical VIII - Hearing & vestibular intact bilaterally. X - Palate elevates symmetrically. XII - Tongue protrusion intact.  Motor Strength - The patient's strength was normal in all extremities. Bulk was normal and fasciculations were absent.     Reflexes - The patient's reflexes were symmetrical in all extremities and she had no pathological reflexes.  Sensory - Light touch, temperaturewere assessed and showed left upper extremity reduced to light touch, and left lower extremities decreased light touch and and neglect to double stimulation.   Coordination - The patient had normal movements in the right hand with no ataxia or dysmetria.  However, left FTN slow but not ataxic anymore. Tremor was absent.  Gait and Station - deferred.   ASSESSMENT/PLAN [name] is a 55 y.o. female with history of HTN, migraine presenting with left facial droop and slurry speech.   Stroke:  right MCA infarct due to right ICA occlusion s/p tPA complicated with small SAH  and later right ICA stenting, etiology unclear CT head code stroke Acute infarct in the right insula and frontal operculum, ASPECTS is 8. CTA head and neck - right ICA occlusion and right M1/M2 non-occlusive clot vs. High grade stenosis Serial CT Head stable small SAH right frontal. MRI SWI only - Positive for small volume subarachnoid hemorrhage. MRI: repeat: right MCA multifocal moderate sized infarcts, stable trace SAH right frontal.  MRA  occlusion of right ICA reconstitution at ophthalmic segment. Occlusion of right M3.  Left VA occlusion, age indeterminate 8/9 s/p right ICA revascularization and stent 8/10 CUS right ICA stent patent 2D Echo EF 60-65% TEE showed normal EF but possible  PFO TCD bubble study showed [name] degree II with Valsalva LE venous Doppler no DVT Will consider 30 day cardiac event monitoring as outpt LDL 172 HgbA1c 6.2 Hypercoagulable labs so far negative, some are still pending VTE prophylaxis - lovenox No antithrombotic prior to admission, now on ASA 81 and Brilinta in after right ICA revascularization and stenting Therapy recommendations:  CIR Disposition:  pending Discussed at length about stroke prevention including diet, exercise, HTN mgt. She likely has sleep apnea with is a significant stroke risk factor.  Advised that she will need sleep study outpt after discharge to get this addressed.  Trace right frontal SAH post tPA Fibrinogen level 248 Serial CT stable SAH No reversal needed MRI showed trace SAH right frontal, unchanged  Right ICA occlusion Per Dr. Corliss Skains  S/p revascularization and stenting of RICA 8/9 On aspirin and brilinta   Right femoral artery pseudoaneurysm Post op right groin hematoma Doppler showed small 2.3 x 2 pseudoaneurysm Vascular surgery consulted -> s/p repair. Signed off today.  PFO TEE showed possible PFO TCD bubble study showed [name] degree 1 at rest, degree 2 with Valsalva, indicating a small PFO Rope score = 6 Will refer to Dr. Excell Seltzer cardiology for further evaluation outpt.  Orthostatic hypotension Patient new or worsening after sitting up at bedside commode, BP down to 80s, put back in bed, symptoms improving 1 L normal saline bolus stat 8/8 Albumin every 6 hours for 4 doses 8/8 Avoid low BP Stable now  Hypertension Home meds:  zestoretic Stable BP goal 120-160 s/p right ICA revasc Long-term BP goal normotensive  Hyperlipidemia Home meds:  none LDL 172, goal < 70 Add atorvastatin 80 mg daily  Continue statin at discharge  Other Stroke Risk Factors Former cigarette smoker, advised to stay a non-smoker.  Discussed at length to avoid smoking. ETOH use, alcohol level <10, advised to  drink no more than 1 drink a day Migraines Probable OSA - need outpt follow up   Hospital day # 7  spend in discussion of stroke risk factors/reviewing chart and updating on diagnosis and counseling.   [name], MD  08/19/2020 3:45 PM  To contact Stroke Continuity provider, please refer to [email or web address]. After hours, contact General Neurology

## 2020-08-20 ENCOUNTER — Telehealth: Payer: Self-pay

## 2020-08-20 NOTE — Progress Notes (Addendum)
Patient ID: Vicki Gonzales, female   DOB: 03-06-1965, 55 y.o.   MRN: [id number] STROKE TEAM PROGRESS NOTE   INTERVAL HISTORY Her husband is at the bedside.  Improving and doing better everyday. No new events. She would like to take a shower.  Neurological exam is unchanged.  She has no new complaints.  She does complain of snoring and knowing that she is at risk for sleep apnea but she is never been evaluated for this.  She is willing to consider participation in the sleep smart study  Vitals:   08/19/20 1952 08/20/20 0037 08/20/20 0417 08/20/20 0756  BP: (!) 146/88 134/80 (!) 144/63 138/64  Pulse: 71 67 60 67  Resp: 20 19 20 20   Temp: 97.9 F (36.6 C) 98.3 F (36.8 C) 98 F (36.7 C) (!) 97.5 F (36.4 C)  TempSrc: Oral Oral Oral Oral  SpO2: 97% 99% 96% 98%  Weight:      Height:       CBC:  Recent Labs  Lab 08/14/20 0340 08/15/20 0536 08/16/20 0416 08/17/20 0403  WBC 10.4 12.6* 11.1* 12.2*  NEUTROABS 7.6 9.4*  --   --   HGB 12.6 11.0* 10.5* 10.2*  HCT 37.1 32.8* 31.9* 30.3*  MCV 85.7 87.2 87.2 86.3  PLT 307 277 276 276    Basic Metabolic Panel:  Recent Labs  Lab 08/16/20 0416 08/17/20 0403  NA 140 140  K 3.3* 3.0*  CL 109 111  CO2 22 22  GLUCOSE 134* 112*  BUN 9 13  CREATININE 0.55 0.58  CALCIUM 8.7* 8.2*        PHYSICAL EXAM  Temp:  [97.5 F (36.4 C)-98.3 F (36.8 C)] 97.5 F (36.4 C) (08/15 0756) Pulse Rate:  [60-71] 67 (08/15 0756) Resp:  [19-20] 20 (08/15 0756) BP: (134-146)/(63-88) 138/64 (08/15 0756) SpO2:  [96 %-99 %] 98 % (08/15 0756)  General -obese middle-age Caucasian lady, in no apparent distress.  Ophthalmologic - fundi not visualized due to noncooperation.  Cardiovascular - Regular rhythm and rate.  Mental Status -  Alert. Oriented x 3. Language including expression, naming, repetition, comprehension was assessed and found intact. No aphasia.  Cranial Nerves II - XII - II - Visual field intact OU. III, IV, VI -  Extraocular movements intact. V - Facial sensation intact bilaterally. VII - facial symmetrical except slight nasolabial fold asymmetry when she smiles on the left. VIII - Hearing & vestibular intact bilaterally. X - Palate elevates symmetrically. XII - Tongue protrusion intact.  Motor Strength - left is slightly more weak than right 4/5, Right arm and leg 5/5.  Orbits right over left upper extremity.  Diminished fine finger movements on the left.  Trace left grip weakness.   Reflexes - The patient's reflexes were symmetrical in all extremities and she had no pathological reflexes.  Sensory - Light touch, temperaturewere assessed and showed left upper extremity reduced to light touch, and left lower extremities decreased light touch and and neglect to double stimulation.   Coordination - The patient had normal movements in the right hand with no ataxia or dysmetria.  However, left FTN slow but not ataxic anymore. Tremor was absent.  Gait and Station - deferred.  NIHSS 2 Premorbid modified Rankin scale 0 yes  ASSESSMENT/PLAN Vicki Gonzales is a 55 y.o. female with history of HTN, migraine presenting with left facial droop and slurry speech.   Stroke:  right MCA infarct due to right ICA occlusion s/p tPA complicated  with small SAH and later right ICA stenting, etiology unclear CT head code stroke Acute infarct in the right insula and frontal operculum, ASPECTS is 8. CTA head and neck - right ICA occlusion and right M1/M2 non-occlusive clot vs. High grade stenosis Serial CT Head stable small SAH right frontal. MRI SWI only - Positive for small volume subarachnoid hemorrhage. MRI: repeat: right MCA multifocal moderate sized infarcts, stable trace SAH right frontal.  MRA  occlusion of right ICA reconstitution at ophthalmic segment. Occlusion of right M3.  Left VA occlusion, age indeterminate 8/9 s/p right ICA revascularization and stent 8/10 CUS right ICA stent patent 2D Echo  EF 60-65% TEE showed normal EF but possible PFO TCD bubble study showed [name] degree II with Valsalva LE venous Doppler no DVT Will consider 30 day cardiac event monitoring as outpt LDL 172 on atorvastatin 80mg   HgbA1c 6.2 Hypercoagulable labs so far negative, some are still pending VTE prophylaxis - lovenox No antithrombotic prior to admission, now on ASA 81 and Brilinta in after right ICA revascularization and stenting Therapy recommendations:  CIR Disposition:  pending  Trace right frontal SAH post tPA Fibrinogen level 248 Serial CT stable SAH No reversal needed MRI showed trace SAH right frontal, unchanged  Right ICA occlusion Per Dr.  S/p revascularization and stenting of RICA 8/9 On aspirin and brilinta   Right femoral artery pseudoaneurysm Post op right groin hematoma Doppler showed small 2.3 x 2 pseudoaneurysm Vascular surgery consulted -> s/p repair. Signed off today.  PFO TEE showed possible PFO TCD bubble study showed [name] degree 1 at rest, degree 2 with Valsalva, indicating a small PFO Rope score = 6 Will refer to Dr. 10/9 cardiology for further evaluation outpt.  Orthostatic hypotension Patient new or worsening after sitting up at bedside commode, BP down to 80s, put back in bed, symptoms improving 1 L normal saline bolus stat 8/8 Albumin every 6 hours for 4 doses 8/8 Avoid low BP Stable now  Hypertension Home meds:  zestoretic Stable BP goal 120-160 s/p right ICA revasc Long-term BP goal normotensive  Hyperlipidemia Home meds:  none LDL 172, goal < 70  Continue statin at discharge  Other Stroke Risk Factors Former cigarette smoker, advised to stay a non-smoker.  Discussed at length to avoid smoking. ETOH use, alcohol level <10, advised to drink no more than 1 drink a day Migraines Probable OSA - need outpt follow up   Hospital day # 8 Excell Seltzer, NP   08/20/2020 4:20 PM  I have personally obtained history,examined this  patient, reviewed notes, independently viewed imaging studies, participated in medical decision making and plan of care.ROS completed by me personally and pertinent positives fully documented  I have made any additions or clarifications directly to the above note. Agree with note above.  Patient presented with a right MCA infarct and underwent successful thrombectomy and is improving.  Continue ongoing physical occupational therapy and transfer to inpatient rehab when bed available.  Continue aspirin and Brilinta following her risky right ICA stent.  Patient also appears to be at risk for sleep apnea and may benefit with consideration for possible participation in the sleep smart study.  She has expressed interest and will be given written information to review and decide.  Greater than 50% time during this 35-minute visit was spent of counseling and coordination of care and discussion with patient about stroke risk prevention and treatment and answering questions.  08/22/2020, MD Medical Director [facility] Pager:  [phone] 08/20/2020 5:58 PM

## 2020-08-20 NOTE — Progress Notes (Signed)
OT Cancellation Note  Patient Details Name: Vicki Gonzales MRN: [id number] DOB: Oct 10, 1965   Cancelled Treatment:    Reason Eval/Treat Not Completed: Other (comment) (Pt sleeping soundly. Husband kindly request OT return later. WIll return as schedule allows.)  [name] M [name] [name] MSOT, OTR/L Acute Rehab Pager: [phone] Office: [phone] 08/20/2020, 1:43 PM

## 2020-08-20 NOTE — Progress Notes (Addendum)
Physical Therapy Treatment Patient Details Name: Vicki Gonzales MRN: [id number] DOB: [birth year] Today's Date: 08/20/2020    History of Present Illness Pt is a 55 y.o. F who presents with dysarthric speech and left facial droop. Found to have a developing R MCA stroke with an ASPECTS of 8 and a R MCA bifurcation subocclusive thrombus vs stenosis. Received tPA. She reported headache on arrival to [facility] and MRI SWI sequences confirmed a small volume R frontal SAH. Thrombectomy was not pursued and tPA was not reversed immediately due to concern for progression of R MCA stroke. Pt developed Rt femoral  pseudoaneurysm 8/11 and underwent repair. Significant PMH: HTN, migraines.    PT Comments    Pt progressing towards physical therapy goals. Was able to ambulate fairly well with the RW today, and able to take hands off the walker and statically stand. Progressed to a SPC where significantly more assistance was required due to poor coordination and balance. Continue to feel this patient would benefit from continued multidisciplinary therapies available at CIR. Will continue to follow.     Follow Up Recommendations  CIR     Equipment Recommendations  Other (comment) (defer to post acute)    Recommendations for Other Services Rehab consult     Precautions / Restrictions Precautions Precautions: Fall Precaution Comments: L inattention, SBP < 140 Restrictions Weight Bearing Restrictions: No    Mobility  Bed Mobility Overal bed mobility: Needs Assistance Bed Mobility: Sit to Supine;Supine to Sit     Supine to sit: Supervision Sit to supine: Modified independent (Device/Increase time)   General bed mobility comments: Husband elevating HOB to ease transfer for pt. PT asked that we attempt without HOB elevated as they do not have an adjustable bed at home, however pt completed transition to EOB before we could bring the [facility] back down.    Transfers Overall transfer level:  Needs assistance Equipment used: Rolling walker (2 wheeled) Transfers: Sit to/from Stand Sit to Stand: Min guard         General transfer comment: VC's for hand placement on seated surface for safety. No physical assist required however hands on guarding provided as pt mildly impulsive and reports being fearful of falling.  Ambulation/Gait Ambulation/Gait assistance: Min assist;Mod assist Gait Distance (Feet): 200 Feet Assistive device: Rolling walker (2 wheeled) Gait Pattern/deviations: Step-through pattern;Step-to pattern;Shuffle;Decreased stride length;Staggering left;Staggering right Gait velocity: decreased Gait velocity interpretation: 1.31 - 2.62 ft/sec, indicative of limited community ambulator General Gait Details: Min assist for balance support with head turns and occasional walker management. Overall ambulating well with the RW and appears to be placing little weight through UE's on the walker. Trialed the [facility] at end of session with poor coordination, an exaggeration of step length on the L, difficulty making corrective changes with multimodal cues, and heavy mod assist through the LUE with cane on the R.   Stairs             Wheelchair Mobility    Modified Rankin (Stroke Patients Only) Modified Rankin (Stroke Patients Only) Pre-Morbid Rankin Score: No symptoms Modified Rankin: Moderately severe disability     Balance Overall balance assessment: Needs assistance Sitting-balance support: Feet supported Sitting balance-Leahy Scale: Fair     Standing balance support: Bilateral upper extremity supported;During functional activity Standing balance-Leahy Scale: Poor Standing balance comment: Pt requires UE support.  Cognition Arousal/Alertness: Awake/alert Behavior During Therapy: WFL for tasks assessed/performed Overall Cognitive Status: Impaired/Different from baseline Area of Impairment: Following  commands;Safety/judgement;Awareness;Problem solving                   Current Attention Level: Sustained;Selective   Following Commands: Follows one step commands consistently;Follows multi-step commands inconsistently Safety/Judgement: Decreased awareness of safety (Hyperfocused on fear of falling and safety, however decreased safety awareness during activity.) Awareness: Emergent Problem Solving: Difficulty sequencing;Requires verbal cues;Requires tactile cues        Exercises      General Comments General comments (skin integrity, edema, etc.): Husband present throughout and supportive      Pertinent Vitals/Pain Pain Assessment: Faces Faces Pain Scale: Hurts a little bit Pain Location: Rt groin Pain Descriptors / Indicators: Discomfort;Guarding;Grimacing Pain Intervention(s): Limited activity within patient's tolerance;Monitored during session;Repositioned    Home Living                      Prior Function            PT Goals (current goals can now be found in the care plan section) Acute Rehab PT Goals Patient Stated Goal: Back to walking 5 miles a day with her husband. PT Goal Formulation: With patient Time For Goal Achievement: 08/27/20 Potential to Achieve Goals: Good Progress towards PT goals: Progressing toward goals    Frequency    Min 4X/week      PT Plan Current plan remains appropriate    Co-evaluation              AM-PAC PT "6 Clicks" Mobility   Outcome Measure  Help needed turning from your back to your side while in a flat bed without using bedrails?: A Little Help needed moving from lying on your back to sitting on the side of a flat bed without using bedrails?: A Lot Help needed moving to and from a bed to a chair (including a wheelchair)?: A Lot Help needed standing up from a chair using your arms (e.g., wheelchair or bedside chair)?: A Lot Help needed to walk in hospital room?: A Lot Help needed climbing 3-5 steps  with a railing? : Total 6 Click Score: 12    End of Session Equipment Utilized During Treatment: Gait belt Activity Tolerance: Patient tolerated treatment well Patient left: in bed;with call bell/phone within reach;with bed alarm set;with nursing/sitter in room Nurse Communication: Mobility status PT Visit Diagnosis: Unsteadiness on feet (R26.81);Difficulty in walking, not elsewhere classified (R26.2);Hemiplegia and hemiparesis Hemiplegia - Right/Left: Left Hemiplegia - dominant/non-dominant: Non-dominant Hemiplegia - caused by: Cerebral infarction;Nontraumatic SAH     Time: 5329-9242 PT Time Calculation (min) (ACUTE ONLY): 26 min  Charges:  $Gait Training: 23-37 mins                     Conni Slipper, PT, DPT Acute Rehabilitation Services Pager: [phone] Office: [phone]    Marylynn Pearson 08/20/2020, 1:03 PM

## 2020-08-20 NOTE — Telephone Encounter (Signed)
Per Dr. Roda Shutters, PFO consult scheduled 10/01/2020 with Dr. Excell Seltzer. The patient was grateful for call and agrees with plan.     Tonny Bollman, MD  Marvel Plan, MD; Henrietta Dine, RN; Tonny Bollman, MD Will do thank Jindong        Previous Messages   ----- Message -----  From: Marvel Plan, MD  Sent: 08/17/2020   7:57 PM EDT  To: Tonny Bollman, MD  Subject: Inpatient Notes                                 Hi, MIke - 55 yo with right ICA occlusion, stroke work up TEE showed PFO, but TCD bubble study only a very small PFO (spencer degree II with valsalva). ROPE score 6. She did have HTN, HLD, probably OSA. Not sure how good candidate she is, but please take a look at her TEE and evaluate her as outpt to see what you think. Thanks very much. - Jindong

## 2020-08-20 NOTE — Progress Notes (Signed)
Inpatient Rehab Admissions Coordinator:  Saw pt at bedside. Husband and daughter present. Informed pt/family going to being insurance authorization.  Will continue to follow.   Wolfgang Phoenix, MS, CCC-SLP Admissions Coordinator [phone]

## 2020-08-21 ENCOUNTER — Encounter (HOSPITAL_COMMUNITY): Payer: Self-pay | Admitting: Cardiology

## 2020-08-21 NOTE — H&P (Signed)
Physical Medicine and Rehabilitation Admission H&P     HPI: Vicki Gonzales is a 55 year old right-handed female with history of hypertension hypothyroidism, migraine headaches and tobacco use.  History taken from chart review and patient.  Patient lives with spouse independent prior to admission.  Two-level home with one-step to entry.  Good family support noted.  She presented on 08/12/2020 to [facility] with dysarthria and left hemiparesis.  Cranial CT scan unremarkable for acute intracranial process.  CT angiogram head and neck proximal right ICA occlusion with intracranial reconstitution.  Findings suspicious for subocclusive embolus versus severe stenosis at the right MCA bifurcation.  Occlusion of the left vertebral artery its origin with distal reconstitution.  Patient did receive tPA.  Echocardiogram with ejection fraction of 60-65%, no wall motion abnormalities grade 1 diastolic dysfunction.  MRI and MRA showed multifocal areas of right MCA territory infarct including a new area in the right perirolandic region.  Few small foci of susceptibility artifact within the areas of restricted diffusion.  Stable trace right subarachnoid hemorrhage.  Patient was transferred to [facility] underwent four-vessel cerebral arteriogram with revascularization of occluded right ICA on 08/14/2020 per interventional radiology.  Hospital course further complicated by post arteriogram right groin pain findings of right femoral pseudoaneurysm underwent repair of right femoral pseudoaneurysm 08/15/2020.  Hospital course TEE showed normal ejection fraction possible PFO TCD bubble study showed [name] degree 2 with Valsalva.  Hyper coag labs thus far remain negative.  Recommendations 30-day cardiac event monitor.  Patient was cleared to begin aspirin 81 mg daily and Brilinta for CVA prophylaxis.  Maintained on Lovenox for DVT prophylaxis.  Tolerating a regular consistency diet.  Therapy evaluations completed due to  patient's left-sided hemiparesis was admitted for a comprehensive rehab program.  Please see preadmission assessment from earlier today as well.  Review of Systems  Constitutional:  Negative for chills and fever.  HENT:  Negative for hearing loss.   Eyes:  Negative for blurred vision and double vision.  Respiratory:  Negative for cough and shortness of breath.   Cardiovascular:  Negative for chest pain and palpitations.  Gastrointestinal:  Positive for constipation. Negative for heartburn, nausea and vomiting.       GERD  Genitourinary:  Negative for dysuria, flank pain and hematuria.  Musculoskeletal:  Positive for myalgias.  Skin:  Negative for rash.  Neurological:  Positive for weakness and headaches.  Psychiatric/Behavioral:  Positive for depression.   All other systems reviewed and are negative. Past Medical History:  Diagnosis Date   ADHD    Anxiety    Carpal tunnel syndrome on right    Depression    HTN (hypertension)    Hypothyroidism    Migraines    Spondylosis    Past Surgical History:  Procedure Laterality Date   ANTERIOR CRUCIATE LIGAMENT REPAIR Left 2021   APPENDECTOMY     BUBBLE STUDY  08/17/2020   Procedure: BUBBLE STUDY;  Surgeon: Jodelle Red, MD;  Location: [facility] ENDOSCOPY;  Service: Cardiovascular;;   FALSE ANEURYSM REPAIR Right 08/15/2020   Procedure: REPAIR RIGHT FEMORAL PSEUDOANEURYSM;  Surgeon: Nada Libman, MD;  Location: [facility] OR;  Service: Vascular;  Laterality: Right;   IR ANGIO INTRA EXTRACRAN SEL COM CAROTID INNOMINATE UNI L MOD SED  08/14/2020   IR ANGIO INTRA EXTRACRAN SEL INTERNAL CAROTID UNI R MOD SED  08/14/2020   IR ANGIO VERTEBRAL SEL SUBCLAVIAN INNOMINATE BILAT MOD SED  08/14/2020   IR CT HEAD LTD  08/14/2020  IR CT HEAD LTD  08/14/2020   IR INTRA CRAN STENT  08/14/2020   IR INTRAVSC STENT CERV CAROTID W/O EMB-PROT MOD SED INC ANGIO  08/14/2020   RADIOLOGY WITH ANESTHESIA N/A 08/14/2020   Procedure: IR WITH ANESTHESIA;  Surgeon: Julieanne Cotton, MD;  Location: [facility] OR;  Service: Radiology;  Laterality: N/A;   TEE WITHOUT CARDIOVERSION N/A 08/17/2020   Procedure: TRANSESOPHAGEAL ECHOCARDIOGRAM (TEE);  Surgeon: Jodelle Red, MD;  Location: [facility] ENDOSCOPY;  Service: Cardiovascular;  Laterality: N/A;   TUBAL LIGATION     History reviewed. No pertinent family history. Social History:  reports that she has quit smoking. She has never used smokeless tobacco. No history on file for alcohol use and drug use. Allergies:  Allergies  Allergen Reactions   Keflex [Cephalexin]    Medications Prior to Admission  Medication Sig Dispense Refill   acetaminophen (TYLENOL) 500 MG tablet Take 500 mg by mouth every 6 (six) hours as needed for mild pain, fever or headache.     levothyroxine (SYNTHROID) 50 MCG tablet Take 50 mcg by mouth every morning.     lisinopril-hydrochlorothiazide (ZESTORETIC) 20-12.5 MG tablet Take 1 tablet by mouth daily.     PARoxetine (PAXIL) 10 MG tablet Take 10 mg by mouth daily.      Drug Regimen Review Drug regimen was reviewed and remains appropriate with no significant issues identified  Home: Home Living Family/patient expects to be discharged to:: Private residence Living Arrangements: Spouse/significant other, Children Available Help at Discharge: Family, Available 24 hours/day Type of Home: House Home Access: Stairs to enter [employer] of Steps: 1 Home Layout: Two level Alternate Level Stairs-Number of Steps: 13 Alternate Level Stairs-Rails: Right, Left Bathroom Shower/Tub: Tub/shower unit Bathroom Accessibility: Yes Home Equipment: Shower seat, Hand held shower head, Walker - 2 wheels Additional Comments: 1/2 bath main level  Lives With: Family   Functional History: Prior Function Level of Independence: Independent  Functional Status:  Mobility: Bed Mobility Overal bed mobility: Independent Bed Mobility: Supine to Sit, Sit to Supine Rolling: Min guard Sidelying to sit:  Min guard Supine to sit: Independent Sit to supine: Independent Sit to sidelying: Min guard General bed mobility comments: Patient came to sitting edge of bed without assist. ALthough trying to get up with legs still attached to compression pumps. Decreased awareness. Transfers Overall transfer level: Needs assistance Equipment used: None Transfers: Sit to/from Stand Sit to Stand: Min guard Stand pivot transfers: Min assist General transfer comment: min guard A for safety Ambulation/Gait Ambulation/Gait assistance: Min guard Gait Distance (Feet): 300 Feet Assistive device: None (holding to gait belt) Gait Pattern/deviations: Step-through pattern, Staggering right, Staggering left General Gait Details: min guard to min assist for balance. Patient reaching out for walls, railing in hallway for steadying at times. Became fatigued after walking about 150 feet and required standing rest at desk. Became less safe and steady with increased distance. And tried to enter room that was not hers. Decreased awareness of objects on left. Gait velocity: decreased Gait velocity interpretation: 1.31 - 2.62 ft/sec, indicative of limited community ambulator    ADL: ADL Overall ADL's : Needs assistance/impaired Eating/Feeding: Supervision/ safety, Sitting, Bed level Grooming: Wash/dry hands, Wash/dry face, Brushing hair, Supervision/safety, Sitting Upper Body Bathing: Moderate assistance, Sitting Lower Body Bathing: Maximal assistance, Sit to/from stand Upper Body Dressing : Moderate assistance, Sitting Lower Body Dressing: Moderate assistance, Sit to/from stand Lower Body Dressing Details (indicate cue type and reason): able to don socks EOB, but requires assist to pull  pants over hips and assist for balance Toilet Transfer: Min guard, Ambulation, RW Toilet Transfer Details (indicate cue type and reason): cues for rw management but once instructed was able to maintain. Toileting- Clothing Manipulation  and Hygiene: Total assistance, Sit to/from stand Functional mobility during ADLs: Min guard, Rolling walker General ADL Comments: Pt performing four part trail making task to challenge L inattention, balance, and cognition. Pt running into objects on L side ~50% of time (ie.e door frames, chairs, the wall). Pt requiring cues for recalling the four locations, problem solving finding third room, and maintaining attention. Pt very motivated. Able to verbalize deficits with Mod cues for increasing insight. Reviewing task at end of session; perseverating on main deficits being "not falling"  Cognition: Cognition Overall Cognitive Status: Impaired/Different from baseline Arousal/Alertness: Awake/alert Orientation Level: Oriented X4 Attention: Selective Selective Attention: Impaired Selective Attention Impairment: Verbal basic, Functional basic Memory: Appears intact Awareness: Impaired Awareness Impairment: Intellectual impairment Problem Solving: Impaired Problem Solving Impairment: Verbal complex Executive Function: Self Monitoring, Organizing Organizing: Impaired Organizing Impairment: Verbal basic Self Monitoring: Impaired Behaviors: Impulsive Safety/Judgment: Impaired Cognition Arousal/Alertness: Awake/alert Behavior During Therapy: WFL for tasks assessed/performed Overall Cognitive Status: Impaired/Different from baseline Area of Impairment: Following commands, Safety/judgement, Awareness, Problem solving, Memory, Attention Current Attention Level: Sustained, Selective (Reports having to "work hard" to focus when in distracting environment) Memory: Decreased short-term memory Following Commands: Follows one step commands consistently, Follows multi-step commands inconsistently Safety/Judgement: Decreased awareness of safety, Decreased awareness of deficits Awareness: Emergent Problem Solving: Difficulty sequencing, Requires verbal cues, Requires tactile cues General Comments:  focused session on challenging ST memory, awareness, attention, and problem solving deficits. Pt participating in four part trail making task. Requiring Mod cues for completion. When providing instructions for four part trail making, pt requiring 5 repeats of directions due to decreased ST memory and attention  Physical Exam: Blood pressure (!) 158/76, pulse 60, temperature 99 F (37.2 C), temperature source Oral, resp. rate 16, height 5\' 6"  (1.676 m), weight 84.5 kg, last menstrual period 04/07/2019, SpO2 99 %. Physical Exam Constitutional:      General: She is not in acute distress.    Appearance: She is obese.  HENT:     Head: Normocephalic and atraumatic.     Right Ear: External ear normal.     Left Ear: External ear normal.     Nose: Nose normal.  Eyes:     General:        Right eye: No discharge.        Left eye: No discharge.     Extraocular Movements: Extraocular movements intact.  Cardiovascular:     Rate and Rhythm: Normal rate and regular rhythm.  Pulmonary:     Effort: Pulmonary effort is normal. No respiratory distress.     Breath sounds: No stridor.  Abdominal:     General: Abdomen is flat. There is no distension.  Musculoskeletal:     Cervical back: Normal range of motion and neck supple.     Comments: No edema or tenderness in extremities  Skin:    General: Skin is warm and dry.  Neurological:     Mental Status: She is alert and oriented to person, place, and time.     Comments: Alert Makes good eye contact with examiner.   Motor: LUE/LLE: 4+/5 proximal distal RUE/RLE: 5/5 proximal to distal Sensation intact light touch Mild ataxia left upper extremity Apraxia Some difficulty with two-step commands  Psychiatric:        Mood  and Affect: Mood normal.        Behavior: Behavior normal.    No results found for this or any previous visit (from the past 48 hour(s)). No results found.     Medical Problem List and Plan: 1.  Left-sided weakness secondary to  right MCA infarct due to right ICA occlusion status post tPA complicated by small SAH with later right ICA stenting complicated by right femoral artery pseudoaneurysm with repair 08/15/2020 per vascular surgery  -patient may shower  -ELOS/Goals: 4 to 6 days/revision/mod I  Admit to CIR 2.  Antithrombotics: -DVT/anticoagulation: Lovenox Mechanical: Sequential compression devices, entire leg Bilateral lower extremities Pharmaceutical: Lovenox  -antiplatelet therapy: Aspirin 81 mg daily and Brilinta 90 mg twice daily 3. Pain Management: Tramadol as needed 4. Mood: Paxil 10 mg daily  -antipsychotic agents: N/A 5. Neuropsych: This patient is capable of making decisions on her own behalf. 6. Skin/Wound Care: Routine skin checks 7. Fluids/Electrolytes/Nutrition: Routine in and outs  CMP ordered for tomorrow 8.  Hyperlipidemia: Lipitor 9.  GERD.  Protonix 10.  Hypothyroidism: Synthroid 11.  Hypokalemia  Potassium 3.0 on 8/12, labs ordered for tomorrow   Charlton Amor, PA-C 08/22/2020  I have personally performed a face to face diagnostic evaluation, including, but not limited to relevant history and physical exam findings, of this patient and developed relevant assessment and plan.  Additionally, I have reviewed and concur with the physician assistant's documentation above.  Maryla Morrow, MD, ABPMR

## 2020-08-21 NOTE — Progress Notes (Signed)
Physical Therapy Treatment Patient Details Name: Vicki Gonzales MRN: [id number] DOB: 09/18/1965 Today's Date: 08/21/2020    History of Present Illness Pt is a 55 y.o. F who presents with dysarthric speech and left facial droop. Found to have a developing R MCA stroke with an ASPECTS of 8 and a R MCA bifurcation subocclusive thrombus vs stenosis. Received tPA. She reported headache on arrival to [facility] and [facility] and MRI SWI sequences confirmed a small volume R frontal SAH. Thrombectomy was not pursued and tPA was not reversed immediately due to concern for progression of R MCA stroke. Pt developed Rt femoral  pseudoaneurysm 8/11 and underwent repair. Significant PMH: HTN, migraines.    PT Comments    Patient received in bed, reports she is walking to bathroom without AD, took a shower today. She is agreeable to PT session. Patient is independent with bed mobility, transfers with supervision/min guard. She ambulated 300 feet without ad, but reaching for rail and walls especially as she got fatigued. Patient staggers right and left while ambulating requiring min guard/min assist for steadying. She required one standing rest break due to fatigue.  Patient will continue to benefit from skilled PT while here to improve safety and independence.        Follow Up Recommendations  CIR- if not approved for CIR outpatient PT would be appropriate     Equipment Recommendations  Other (comment) (TBD)    Recommendations for Other Services Rehab consult     Precautions / Restrictions Precautions Precautions: Fall Precaution Comments: L inattention, SBP < 140 Restrictions Weight Bearing Restrictions: No    Mobility  Bed Mobility Overal bed mobility: Independent Bed Mobility: Supine to Sit     Supine to sit: Independent     General bed mobility comments: Patient came to sitting edge of bed without assist. ALthough trying to get up with legs still attached to compression pumps. Decreased  awareness.    Transfers Overall transfer level: Needs assistance Equipment used: None Transfers: Sit to/from Stand Sit to Stand: Min guard         General transfer comment: Slow safe transfer this session.  Ambulation/Gait Ambulation/Gait assistance: Min guard Gait Distance (Feet): 300 Feet Assistive device: None (holding to gait belt) Gait Pattern/deviations: Step-through pattern;Staggering right;Staggering left Gait velocity: decreased   General Gait Details: min guard to min assist for balance. Patient reaching out for walls, railing in hallway for steadying at times. Became fatigued after walking about 150 feet and required standing rest at desk. Became less safe and steady with increased distance. And tried to enter room that was not hers. Decreased awareness of objects on left.   Stairs             Wheelchair Mobility    Modified Rankin (Stroke Patients Only) Modified Rankin (Stroke Patients Only) Pre-Morbid Rankin Score: No symptoms Modified Rankin: Moderately severe disability     Balance Overall balance assessment: Needs assistance Sitting-balance support: Feet supported Sitting balance-Leahy Scale: Good Sitting balance - Comments: no support needed for sitting edge of bed   Standing balance support: Single extremity supported;During functional activity Standing balance-Leahy Scale: Fair Standing balance comment: requires single UE support about 50% of the time due to unsteadiness.                            Cognition Arousal/Alertness: Awake/alert Behavior During Therapy: WFL for tasks assessed/performed Overall Cognitive Status: Impaired/Different from baseline Area of Impairment: Following  commands;Safety/judgement;Awareness;Problem solving                   Current Attention Level: Sustained   Following Commands: Follows one step commands consistently;Follows multi-step commands inconsistently Safety/Judgement: Decreased  awareness of safety;Decreased awareness of deficits Awareness: Emergent Problem Solving: Difficulty sequencing;Requires verbal cues;Requires tactile cues        Exercises Other Exercises Other Exercises: Instructed patient in STS for LE strengthening exercise    General Comments        Pertinent Vitals/Pain Pain Assessment: No/denies pain    Home Living                      Prior Function            PT Goals (current goals can now be found in the care plan section) Acute Rehab PT Goals Patient Stated Goal: Back to walking 5 miles a day with her husband. PT Goal Formulation: With patient Time For Goal Achievement: 08/27/20 Potential to Achieve Goals: Good Progress towards PT goals: Progressing toward goals    Frequency    Min 4X/week      PT Plan Current plan remains appropriate    Co-evaluation              AM-PAC PT "6 Clicks" Mobility   Outcome Measure  Help needed turning from your back to your side while in a flat bed without using bedrails?: None Help needed moving from lying on your back to sitting on the side of a flat bed without using bedrails?: None Help needed moving to and from a bed to a chair (including a wheelchair)?: A Little Help needed standing up from a chair using your arms (e.g., wheelchair or bedside chair)?: A Little Help needed to walk in hospital room?: A Little Help needed climbing 3-5 steps with a railing? : A Lot 6 Click Score: 19    End of Session Equipment Utilized During Treatment: Gait belt Activity Tolerance: Patient limited by fatigue Patient left: in bed;with call bell/phone within reach;with family/visitor present;with SCD's reapplied Nurse Communication: Mobility status PT Visit Diagnosis: Unsteadiness on feet (R26.81);Difficulty in walking, not elsewhere classified (R26.2);Hemiplegia and hemiparesis Hemiplegia - Right/Left: Left Hemiplegia - dominant/non-dominant: Non-dominant Hemiplegia - caused by:  Cerebral infarction;Nontraumatic SAH     Time: 7026-3785 PT Time Calculation (min) (ACUTE ONLY): 19 min  Charges:  $Gait Training: 8-22 mins                    Allona Gondek, PT, GCS 08/21/20,1:17 PM

## 2020-08-21 NOTE — Progress Notes (Signed)
Occupational Therapy Treatment Patient Details Name: Vicki Gonzales MRN: [id number] DOB: Jun 22, 1965 Today's Date: 08/21/2020    History of present illness 55 y.o. F who presents with dysarthric speech and left facial droop. Found to have a developing R MCA stroke with an ASPECTS of 8 and a R MCA bifurcation subocclusive thrombus vs stenosis. Received tPA. She reported headache on arrival to [facility] and [facility] and MRI SWI sequences confirmed a small volume R frontal SAH. Thrombectomy was not pursued and tPA was not reversed immediately due to concern for progression of R MCA stroke. Pt developed Rt femoral  pseudoaneurysm 8/11 and underwent repair. Significant PMH: HTN, migraines.   OT comments  Pt progressing towards established OT goals and is very motivated to participate in therapy. Pt continues to present with left inattention, poor balance, and cognitive deficits. Pt participating in four part trail making task. Pt requiring Max cues to recall instructions at beginning of task. During task, pt presenting with decreased attention, problem solving, and ST memory. Pt also running into objects on L ~50 of time and requiring increased time for correction. Reviewing task at end of session, pt very motivated to improve and presenting with increasing awareness. Continue to recommend dc to CIR and will continue to follow acutely as admitted.   Follow Up Recommendations  CIR    Equipment Recommendations  None recommended by OT    Recommendations for Other Services Rehab consult    Precautions / Restrictions Precautions Precautions: Fall Precaution Comments: L inattention, SBP < 140       Mobility Bed Mobility Overal bed mobility: Independent Bed Mobility: Supine to Sit;Sit to Supine Rolling: Min guard   Supine to sit: Independent Sit to supine: Independent        Transfers Overall transfer level: Needs assistance Equipment used: None Transfers: Sit to/from Stand Sit to Stand:  Min guard         General transfer comment: min guard A for safety    Balance Overall balance assessment: Needs assistance Sitting-balance support: Feet supported Sitting balance-Leahy Scale: Good Sitting balance - Comments: no support needed for sitting edge of bed   Standing balance support: Single extremity supported;During functional activity Standing balance-Leahy Scale: Fair                             ADL either performed or assessed with clinical judgement   ADL Overall ADL's : Needs assistance/impaired                         Toilet Transfer: Min guard;Ambulation;RW           Functional mobility during ADLs: Min guard;Rolling walker General ADL Comments: Pt performing four part trail making task to challenge L inattention, balance, and cognition. Pt running into objects on L side ~50% of time (ie.e door frames, chairs, the wall). Pt requiring cues for recalling the four locations, problem solving finding third room, and maintaining attention. Pt very motivated. Able to verbalize deficits with Mod cues for increasing insight. Reviewing task at end of session; perseverating on main deficits being "not falling"     Vision   Vision Assessment?: Yes Eye Alignment: Within Functional Limits Ocular Range of Motion: Within Functional Limits Alignment/Gaze Preference: Within Defined Limits Tracking/Visual Pursuits: Other (comment) Visual Fields: Left visual field deficit (vs. inattention) Additional Comments: L visual deficits and inattention   Perception     Praxis  Cognition Arousal/Alertness: Awake/alert Behavior During Therapy: WFL for tasks assessed/performed Overall Cognitive Status: Impaired/Different from baseline Area of Impairment: Following commands;Safety/judgement;Awareness;Problem solving;Memory;Attention                   Current Attention Level: Sustained;Selective (Reports having to "work hard" to focus when in  distracting environment) Memory: Decreased short-term memory Following Commands: Follows one step commands consistently;Follows multi-step commands inconsistently Safety/Judgement: Decreased awareness of safety;Decreased awareness of deficits Awareness: Emergent Problem Solving: Difficulty sequencing;Requires verbal cues;Requires tactile cues General Comments: focused session on challenging ST memory, awareness, attention, and problem solving deficits. Pt participating in four part trail making task. Requiring Mod cues for completion. When providing instructions for four part trail making, pt requiring 5 repeats of directions due to decreased ST memory and attention        Exercises Other Exercises Other Exercises: Instructed patient in STS for LE strengthening exercise   Shoulder Instructions       General Comments husband arriving at end of session    Pertinent Vitals/ Pain       Pain Assessment: No/denies pain  Home Living                                          Prior Functioning/Environment              Frequency  Min 2X/week        Progress Toward Goals  OT Goals(current goals can now be found in the care plan section)  Progress towards OT goals: Progressing toward goals  Acute Rehab OT Goals Patient Stated Goal: Back to walking 5 miles a day with her husband. OT Goal Formulation: With patient/family Time For Goal Achievement: 08/30/20 Potential to Achieve Goals: Good ADL Goals Pt Will Perform Grooming: with min assist;standing Pt Will Perform Upper Body Bathing: with set-up;with supervision;sitting Pt Will Perform Lower Body Bathing: with min assist;sit to/from stand Pt Will Perform Upper Body Dressing: with set-up;with supervision;sitting Pt Will Perform Lower Body Dressing: with min assist;sit to/from stand Pt Will Transfer to Toilet: with min assist;regular height toilet;ambulating;bedside commode;grab bars Additional ADL Goal #1: Pt  will susatin attention to familiar ADL task x 5 mins with min cues Additional ADL Goal #2: Pt will locate all needed ADL items on her Lt with no more than min cues  Plan Discharge plan remains appropriate    Co-evaluation                 AM-PAC OT "6 Clicks" Daily Activity     Outcome Measure   Help from another person eating meals?: None Help from another person taking care of personal grooming?: A Little Help from another person toileting, which includes using toliet, bedpan, or urinal?: A Lot Help from another person bathing (including washing, rinsing, drying)?: A Little Help from another person to put on and taking off regular upper body clothing?: A Little Help from another person to put on and taking off regular lower body clothing?: A Lot 6 Click Score: 17    End of Session Equipment Utilized During Treatment: Gait belt;Rolling walker  OT Visit Diagnosis: Unsteadiness on feet (R26.81);Cognitive communication deficit (R41.841) Symptoms and signs involving cognitive functions: Cerebral infarction   Activity Tolerance Patient tolerated treatment well   Patient Left in bed;with call bell/phone within reach;with bed alarm set;with family/visitor present   Nurse Communication Mobility status  Time: 0093-8182 OT Time Calculation (min): 25 min  Charges: OT General Charges $OT Visit: 1 Visit OT Treatments $Therapeutic Activity: 23-37 mins  Lewin Pellow MSOT, OTR/L Acute Rehab Pager: [phone] Office: [phone]   Theodoro Grist Marchele Decock 08/21/2020, 5:29 PM

## 2020-08-21 NOTE — Progress Notes (Addendum)
Patient ID: Vicki Gonzales, female   DOB: [birth year], 55 y.o.   MRN: [id number] STROKE TEAM PROGRESS NOTE   INTERVAL HISTORY Her husband is at the bedside.  She is sitting at the sink washing up. She voices no new complaints and has been practicing writing with her left hand.  She has been told that she has qualifies to participate in sleep smart study and that research nurse will come in with CPAP equipment later tonight.  Vital signs stable.  Neurological exam unchanged  Vitals:   08/20/20 1956 08/21/20 0103 08/21/20 0356 08/21/20 0854  BP: (!) 187/78 140/62 (!) 159/83 (!) 141/78  Pulse: 72 66 80 64  Resp: 18 16 18 18   Temp: 98 F (36.7 C) 98.4 F (36.9 C) 98.4 F (36.9 C) 97.8 F (36.6 C)  TempSrc: Oral  Oral Oral  SpO2: 100% 97% 97% 98%  Weight:      Height:       CBC:  Recent Labs  Lab 08/15/20 0536 08/16/20 0416 08/17/20 0403  WBC 12.6* 11.1* 12.2*  NEUTROABS 9.4*  --   --   HGB 11.0* 10.5* 10.2*  HCT 32.8* 31.9* 30.3*  MCV 87.2 87.2 86.3  PLT 277 276 276    Basic Metabolic Panel:  Recent Labs  Lab 08/16/20 0416 08/17/20 0403  NA 140 140  K 3.3* 3.0*  CL 109 111  CO2 22 22  GLUCOSE 134* 112*  BUN 9 13  CREATININE 0.55 0.58  CALCIUM 8.7* 8.2*        PHYSICAL EXAM  Temp:  [97.8 F (36.6 C)-98.4 F (36.9 C)] 97.8 F (36.6 C) (08/16 0854) Pulse Rate:  [64-80] 64 (08/16 0854) Resp:  [16-18] 18 (08/16 0854) BP: (116-187)/(62-97) 141/78 (08/16 0854) SpO2:  [97 %-100 %] 98 % (08/16 0854)  General -obese middle-age Caucasian lady, in no apparent distress.  Cardiovascular - Regular rhythm and rate.  Mental Status -  Alert. Oriented x 3. Language including expression, naming, repetition, comprehension was assessed and found intact. No aphasia.  Cranial Nerves II - XII - II - Visual field intact OU. III, IV, VI - Extraocular movements intact. V - Facial sensation intact bilaterally. VII - facial symmetrical except slight nasolabial fold  asymmetry when she smiles on the left. VIII - Hearing & vestibular intact bilaterally. X - Palate elevates symmetrically. XII - Tongue protrusion intact.  Motor Strength - left is slightly more weak than right 4/5, Right arm and leg 5/5.  Diminished fine finger movements on the left.  Trace left grip weakness.  Reflexes - The patient's reflexes were symmetrical in all extremities and she had no pathological reflexes.  Sensory - Light touch, temperaturewere assessed and showed left upper extremity reduced to light touch, and left lower extremities decreased light touch and no neglect to double stimulation.   Coordination - The patient had normal movements in the right hand with no ataxia or dysmetria. Tremor was absent.  Gait and Station - deferred.    ASSESSMENT/PLAN [name] is a 55 y.o. female with history of HTN, migraine presenting with left facial droop and slurry speech.   Stroke:  right MCA infarct due to right ICA occlusion s/p tPA complicated with small SAH and later right ICA stenting, etiology unclear CT head code stroke Acute infarct in the right insula and frontal operculum, ASPECTS is 8. CTA head and neck - right ICA occlusion and right M1/M2 non-occlusive clot vs. High grade stenosis Serial CT Head  stable small SAH right frontal. MRI SWI only - Positive for small volume subarachnoid hemorrhage. MRI: repeat: right MCA multifocal moderate sized infarcts, stable trace SAH right frontal.  MRA  occlusion of right ICA reconstitution at ophthalmic segment. Occlusion of right M3.  Left VA occlusion, age indeterminate 8/9 s/p right ICA revascularization and stent 8/10 CUS right ICA stent patent 2D Echo EF 60-65% TEE showed normal EF but possible PFO TCD bubble study showed [name] degree II with Valsalva LE venous Doppler no DVT Will consider 30 day cardiac event monitoring as outpt LDL 172 on atorvastatin 80mg   HgbA1c 6.2 Hypercoagulable labs are  negative VTE prophylaxis - lovenox No antithrombotic prior to admission, now on ASA 81 and Brilinta in after right ICA revascularization and stenting Therapy recommendations:  CIR Disposition:  pending  Trace right frontal SAH post tPA Fibrinogen level 248 Serial CT stable SAH No reversal needed MRI showed trace SAH right frontal, unchanged  Right ICA occlusion Per Dr.  S/p revascularization and stenting of RICA 8/9 On aspirin and brilinta   Right femoral artery pseudoaneurysm Post op right groin hematoma Doppler showed small 2.3 x 2 pseudoaneurysm Vascular surgery consulted -> s/p repair. Signed off today.  PFO TEE showed possible PFO TCD bubble study showed [name] degree 1 at rest, degree 2 with Valsalva, indicating a small PFO Rope score = 6 Will refer to Dr. 10/9 cardiology for further evaluation outpt.  Orthostatic hypotension Patient new or worsening after sitting up at bedside commode, BP down to 80s, put back in bed, symptoms improving 1 L normal saline bolus stat 8/8 Albumin every 6 hours for 4 doses 8/8 Avoid low BP Stable now  Hypertension Home meds:  zestoretic Stable BP goal 120-160 s/p right ICA revasc Long-term BP goal normotensive  Hyperlipidemia Home meds:  none LDL 172, goal < 70  Continue statin at discharge  Other Stroke Risk Factors Former cigarette smoker, advised to stay a non-smoker.  Discussed at length to avoid smoking. ETOH use, alcohol level <10, advised to drink no more than 1 drink a day Migraines OSA- agreed to participate in sleep smart study   Hospital day # 9 Excell Seltzer, NP   I have personally obtained history,examined this patient, reviewed notes, independently viewed imaging studies, participated in medical decision making and plan of care.ROS completed by me personally and pertinent positives fully documented  I have made any additions or clarifications directly to the above note. Agree with note above.   Continue ongoing therapies and transfer to inpatient rehab when bed available.  Patient tested positive for sleep apnea on the overnight NOx 3 monitor and is participating the sleep smart study.  She will have CPAP mask tolerability test tonight.  Long discussion with patient and husband and answered questions.  Greater than 50% time during this 25-minute visit was spent in counseling and coordination of care about stroke and sleep apnea and answering questions.  Gevena Mart, MD Medical Director [facility] Pager: [phone] 08/21/2020 1:05 PM

## 2020-08-21 NOTE — Progress Notes (Signed)
Inpatient Rehabilitation Admissions Coordinator   We have received insurance approval but no bed to admit her to today. I met with patient at bedside to make her aware as well as spoke with her spouse by phone. She may progress to be able to do outpatient therapy. Spouse concerned that she needs to go up 18 stairs to bedroom and that she does not have supervision when he works. Spouse also awaiting cardiology to see with their recommendations for her PFO. We will follow up tomorrow.  Danne Baxter, RN, MSN Rehab Admissions Coordinator [phone] 08/21/2020 2:19 PM

## 2020-08-22 ENCOUNTER — Inpatient Hospital Stay (HOSPITAL_COMMUNITY)
Admission: RE | Admit: 2020-08-22 | Discharge: 2020-08-29 | DRG: 057 | Disposition: A | Discharge status: Home / Self Care | Payer: BC Managed Care – PPO | Source: Intra-hospital | Attending: Physical Medicine & Rehabilitation | Admitting: Physical Medicine & Rehabilitation

## 2020-08-22 ENCOUNTER — Encounter (HOSPITAL_COMMUNITY): Payer: Self-pay | Admitting: Physical Medicine & Rehabilitation

## 2020-08-22 ENCOUNTER — Other Ambulatory Visit: Payer: Self-pay

## 2020-08-22 ENCOUNTER — Other Ambulatory Visit (HOSPITAL_COMMUNITY): Payer: Self-pay

## 2020-08-22 DIAGNOSIS — E876 Hypokalemia: Secondary | ICD-10-CM | POA: Diagnosis present

## 2020-08-22 DIAGNOSIS — Z881 Allergy status to other antibiotic agents status: Secondary | ICD-10-CM

## 2020-08-22 DIAGNOSIS — Z87891 Personal history of nicotine dependence: Secondary | ICD-10-CM

## 2020-08-22 DIAGNOSIS — Q211 Atrial septal defect: Secondary | ICD-10-CM | POA: Diagnosis not present

## 2020-08-22 DIAGNOSIS — I63511 Cerebral infarction due to unspecified occlusion or stenosis of right middle cerebral artery: Secondary | ICD-10-CM | POA: Diagnosis present

## 2020-08-22 DIAGNOSIS — I6521 Occlusion and stenosis of right carotid artery: Secondary | ICD-10-CM

## 2020-08-22 DIAGNOSIS — I69354 Hemiplegia and hemiparesis following cerebral infarction affecting left non-dominant side: Secondary | ICD-10-CM | POA: Diagnosis present

## 2020-08-22 DIAGNOSIS — E039 Hypothyroidism, unspecified: Secondary | ICD-10-CM

## 2020-08-22 DIAGNOSIS — Z79890 Hormone replacement therapy (postmenopausal): Secondary | ICD-10-CM

## 2020-08-22 DIAGNOSIS — E785 Hyperlipidemia, unspecified: Secondary | ICD-10-CM | POA: Diagnosis present

## 2020-08-22 DIAGNOSIS — F32A Depression, unspecified: Secondary | ICD-10-CM | POA: Diagnosis present

## 2020-08-22 DIAGNOSIS — K219 Gastro-esophageal reflux disease without esophagitis: Secondary | ICD-10-CM | POA: Diagnosis present

## 2020-08-22 DIAGNOSIS — Z79899 Other long term (current) drug therapy: Secondary | ICD-10-CM

## 2020-08-22 DIAGNOSIS — I69393 Ataxia following cerebral infarction: Secondary | ICD-10-CM | POA: Diagnosis not present

## 2020-08-22 DIAGNOSIS — I69319 Unspecified symptoms and signs involving cognitive functions following cerebral infarction: Secondary | ICD-10-CM | POA: Diagnosis not present

## 2020-08-22 DIAGNOSIS — I10 Essential (primary) hypertension: Secondary | ICD-10-CM | POA: Diagnosis present

## 2020-08-22 LAB — CBC
HCT: 33.2 % — ABNORMAL LOW (ref 36.0–46.0)
Hemoglobin: 10.8 g/dL — ABNORMAL LOW (ref 12.0–15.0)
MCH: 28.1 pg (ref 26.0–34.0)
MCHC: 32.5 g/dL (ref 30.0–36.0)
MCV: 86.2 fL (ref 80.0–100.0)
Platelets: 440 10*3/uL — ABNORMAL HIGH (ref 150–400)
RBC: 3.85 MIL/uL — ABNORMAL LOW (ref 3.87–5.11)
RDW: 14 % (ref 11.5–15.5)
WBC: 12.6 10*3/uL — ABNORMAL HIGH (ref 4.0–10.5)
nRBC: 0 % (ref 0.0–0.2)

## 2020-08-22 LAB — CREATININE, SERUM
Creatinine, Ser: 0.74 mg/dL (ref 0.44–1.00)
GFR, Estimated: >60 mL/min (ref >60)

## 2020-08-22 MED ORDER — ENOXAPARIN SODIUM 40 MG/0.4ML IJ SOSY: 40.0000 mg | PREFILLED_SYRINGE | INTRAMUSCULAR | Status: DC

## 2020-08-22 MED ORDER — PAROXETINE HCL 10 MG PO TABS
10.0000 mg | ORAL_TABLET | Freq: Every day | ORAL | Status: DC
  Administered 2020-08-23 – 2020-08-29 (×7): 10 mg via ORAL
  Filled 2020-08-22 (×7): qty 1

## 2020-08-22 MED ORDER — LEVOTHYROXINE SODIUM 50 MCG PO TABS
50.0000 ug | ORAL_TABLET | Freq: Every day | ORAL | Status: DC
  Administered 2020-08-23 – 2020-08-29 (×7): 50 ug via ORAL
  Filled 2020-08-22 (×7): qty 1

## 2020-08-22 MED ORDER — ATORVASTATIN CALCIUM 80 MG PO TABS: 80.0000 mg | ORAL_TABLET | Freq: Every day | ORAL | Status: DC

## 2020-08-22 MED ORDER — ACETAMINOPHEN 650 MG RE SUPP: 650.0000 mg | RECTAL | Status: DC | PRN

## 2020-08-22 MED ORDER — ACETAMINOPHEN 325 MG PO TABS
650.0000 mg | ORAL_TABLET | ORAL | Status: DC | PRN
  Administered 2020-08-23 – 2020-08-25 (×2): 650 mg via ORAL
  Filled 2020-08-22 (×2): qty 2

## 2020-08-22 MED ORDER — ASPIRIN 81 MG PO CHEW
81.0000 mg | CHEWABLE_TABLET | Freq: Every day | ORAL | Status: DC
  Administered 2020-08-23 – 2020-08-29 (×7): 81 mg via ORAL
  Filled 2020-08-22 (×7): qty 1

## 2020-08-22 MED ORDER — PANTOPRAZOLE SODIUM 40 MG PO TBEC
40.0000 mg | DELAYED_RELEASE_TABLET | Freq: Every day | ORAL | Status: DC
  Administered 2020-08-23 – 2020-08-29 (×7): 40 mg via ORAL
  Filled 2020-08-22 (×7): qty 1

## 2020-08-22 MED ORDER — SENNOSIDES-DOCUSATE SODIUM 8.6-50 MG PO TABS
1.0000 | ORAL_TABLET | Freq: Two times a day (BID) | ORAL | Status: DC
  Administered 2020-08-22 – 2020-08-29 (×14): 1 via ORAL
  Filled 2020-08-22 (×14): qty 1

## 2020-08-22 MED ORDER — TICAGRELOR 90 MG PO TABS: 90.0000 mg | ORAL_TABLET | Freq: Two times a day (BID) | ORAL | Status: DC

## 2020-08-22 MED ORDER — TRAMADOL HCL 50 MG PO TABS
50.0000 mg | ORAL_TABLET | Freq: Four times a day (QID) | ORAL | Status: DC | PRN
  Administered 2020-08-23 – 2020-08-28 (×5): 50 mg via ORAL
  Filled 2020-08-22 (×5): qty 1

## 2020-08-22 MED ORDER — ACETAMINOPHEN 160 MG/5ML PO SOLN: 650.0000 mg | ORAL | Status: DC | PRN

## 2020-08-22 MED ORDER — BLOOD PRESSURE CONTROL BOOK
Freq: Once | Status: AC
  Filled 2020-08-22: qty 1

## 2020-08-22 MED ORDER — TICAGRELOR 90 MG PO TABS
90.0000 mg | ORAL_TABLET | Freq: Two times a day (BID) | ORAL | Status: DC
  Administered 2020-08-22 – 2020-08-29 (×14): 90 mg via ORAL
  Filled 2020-08-22 (×14): qty 1

## 2020-08-22 MED ORDER — ASPIRIN 81 MG PO CHEW: 81.0000 mg | CHEWABLE_TABLET | Freq: Every day | ORAL | Status: AC

## 2020-08-22 MED ORDER — ENOXAPARIN SODIUM 40 MG/0.4ML IJ SOSY
40.0000 mg | PREFILLED_SYRINGE | INTRAMUSCULAR | Status: DC
  Administered 2020-08-23 – 2020-08-28 (×6): 40 mg via SUBCUTANEOUS
  Filled 2020-08-22 (×6): qty 0.4

## 2020-08-22 MED ORDER — ATORVASTATIN CALCIUM 80 MG PO TABS
80.0000 mg | ORAL_TABLET | Freq: Every day | ORAL | Status: DC
  Administered 2020-08-23 – 2020-08-29 (×7): 80 mg via ORAL
  Filled 2020-08-22 (×7): qty 1

## 2020-08-22 NOTE — H&P (Signed)
Physical Medicine and Rehabilitation Admission H&P     HPI: Vicki Gonzales Vicki Gonzales is a 55 year old right-handed female with history of hypertension hypothyroidism, migraine headaches and tobacco use.  History taken from chart review and patient.  Patient lives with spouse independent prior to admission.  Two-level home with one-step to entry.  Good family support noted.  She presented on 08/12/2020 to [facility] with dysarthria and left hemiparesis.  Cranial CT scan unremarkable for acute intracranial process.  CT angiogram head and neck proximal right ICA occlusion with intracranial reconstitution.  Findings suspicious for subocclusive embolus versus severe stenosis at the right MCA bifurcation.  Occlusion of the left vertebral artery its origin with distal reconstitution.  Patient did receive tPA.  Echocardiogram with ejection fraction of 60-65%, no wall motion abnormalities grade 1 diastolic dysfunction.  MRI and MRA showed multifocal areas of right MCA territory infarct including a new area in the right perirolandic region.  Few small foci of susceptibility artifact within the areas of restricted diffusion.  Stable trace right subarachnoid hemorrhage.  Patient was transferred to [facility] underwent four-vessel cerebral arteriogram with revascularization of occluded right ICA on 08/14/2020 per interventional radiology.  Hospital course further complicated by post arteriogram right groin pain findings of right femoral pseudoaneurysm underwent repair of right femoral pseudoaneurysm 08/15/2020.  Hospital course TEE showed normal ejection fraction possible PFO TCD bubble study showed [name] degree 2 with Valsalva.  Hyper coag labs thus far remain negative.  Recommendations 30-day cardiac event monitor.  Patient was cleared to begin aspirin 81 mg daily and Brilinta for CVA prophylaxis.  Maintained on Lovenox for DVT prophylaxis.  Tolerating a regular consistency diet.  Therapy evaluations completed due to  patient's left-sided hemiparesis was admitted for a comprehensive rehab program.  Please see preadmission assessment from earlier today as well.  Review of Systems  Constitutional:  Negative for chills and fever.  HENT:  Negative for hearing loss.   Eyes:  Negative for blurred vision and double vision.  Respiratory:  Negative for cough and shortness of breath.   Cardiovascular:  Negative for chest pain and palpitations.  Gastrointestinal:  Positive for constipation. Negative for heartburn, nausea and vomiting.       GERD  Genitourinary:  Negative for dysuria, flank pain and hematuria.  Musculoskeletal:  Positive for myalgias.  Skin:  Negative for rash.  Neurological:  Positive for weakness and headaches.  Psychiatric/Behavioral:  Positive for depression.   All other systems reviewed and are negative. Past Medical History:  Diagnosis Date   ADHD    Anxiety    Carpal tunnel syndrome on right    Depression    HTN (hypertension)    Hypothyroidism    Migraines    Spondylosis    Past Surgical History:  Procedure Laterality Date   ANTERIOR CRUCIATE LIGAMENT REPAIR Left 2021   APPENDECTOMY     BUBBLE STUDY  08/17/2020   Procedure: BUBBLE STUDY;  Surgeon: Jodelle Red, MD;  Location: [facility] ENDOSCOPY;  Service: Cardiovascular;;   FALSE ANEURYSM REPAIR Right 08/15/2020   Procedure: REPAIR RIGHT FEMORAL PSEUDOANEURYSM;  Surgeon: Nada Libman, MD;  Location: [facility] OR;  Service: Vascular;  Laterality: Right;   IR ANGIO INTRA EXTRACRAN SEL COM CAROTID INNOMINATE UNI L MOD SED  08/14/2020   IR ANGIO INTRA EXTRACRAN SEL INTERNAL CAROTID UNI R MOD SED  08/14/2020   IR ANGIO VERTEBRAL SEL SUBCLAVIAN INNOMINATE BILAT MOD SED  08/14/2020   IR CT HEAD LTD  08/14/2020  IR CT HEAD LTD  08/14/2020   IR INTRA CRAN STENT  08/14/2020   IR INTRAVSC STENT CERV CAROTID W/O EMB-PROT MOD SED INC ANGIO  08/14/2020   RADIOLOGY WITH ANESTHESIA N/A 08/14/2020   Procedure: IR WITH ANESTHESIA;  Surgeon: Julieanne Cotton, MD;  Location: [facility] OR;  Service: Radiology;  Laterality: N/A;   TEE WITHOUT CARDIOVERSION N/A 08/17/2020   Procedure: TRANSESOPHAGEAL ECHOCARDIOGRAM (TEE);  Surgeon: Jodelle Red, MD;  Location: [facility] ENDOSCOPY;  Service: Cardiovascular;  Laterality: N/A;   TUBAL LIGATION     History reviewed. No pertinent family history. Social History:  reports that she has quit smoking. She has never used smokeless tobacco. No history on file for alcohol use and drug use. Allergies:  Allergies  Allergen Reactions   Keflex [Cephalexin]    Medications Prior to Admission  Medication Sig Dispense Refill   acetaminophen (TYLENOL) 500 MG tablet Take 500 mg by mouth every 6 (six) hours as needed for mild pain, fever or headache.     levothyroxine (SYNTHROID) 50 MCG tablet Take 50 mcg by mouth every morning.     lisinopril-hydrochlorothiazide (ZESTORETIC) 20-12.5 MG tablet Take 1 tablet by mouth daily.     PARoxetine (PAXIL) 10 MG tablet Take 10 mg by mouth daily.      Drug Regimen Review Drug regimen was reviewed and remains appropriate with no significant issues identified  Home: Home Living Family/patient expects to be discharged to:: Private residence Living Arrangements: Spouse/significant other, Children Available Help at Discharge: Family, Available 24 hours/day Type of Home: House Home Access: Stairs to enter [employer] of Steps: 1 Home Layout: Two level Alternate Level Stairs-Number of Steps: 13 Alternate Level Stairs-Rails: Right, Left Bathroom Shower/Tub: Tub/shower unit Bathroom Accessibility: Yes Home Equipment: Shower seat, Hand held shower head, Walker - 2 wheels Additional Comments: 1/2 bath main level  Lives With: Family   Functional History: Prior Function Level of Independence: Independent  Functional Status:  Mobility: Bed Mobility Overal bed mobility: Independent Bed Mobility: Supine to Sit, Sit to Supine Rolling: Min guard Sidelying to sit:  Min guard Supine to sit: Independent Sit to supine: Independent Sit to sidelying: Min guard General bed mobility comments: Patient came to sitting edge of bed without assist. ALthough trying to get up with legs still attached to compression pumps. Decreased awareness. Transfers Overall transfer level: Needs assistance Equipment used: None Transfers: Sit to/from Stand Sit to Stand: Min guard Stand pivot transfers: Min assist General transfer comment: min guard A for safety Ambulation/Gait Ambulation/Gait assistance: Min guard Gait Distance (Feet): 300 Feet Assistive device: None (holding to gait belt) Gait Pattern/deviations: Step-through pattern, Staggering right, Staggering left General Gait Details: min guard to min assist for balance. Patient reaching out for walls, railing in hallway for steadying at times. Became fatigued after walking about 150 feet and required standing rest at desk. Became less safe and steady with increased distance. And tried to enter room that was not hers. Decreased awareness of objects on left. Gait velocity: decreased Gait velocity interpretation: 1.31 - 2.62 ft/sec, indicative of limited community ambulator    ADL: ADL Overall ADL's : Needs assistance/impaired Eating/Feeding: Supervision/ safety, Sitting, Bed level Grooming: Wash/dry hands, Wash/dry face, Brushing hair, Supervision/safety, Sitting Upper Body Bathing: Moderate assistance, Sitting Lower Body Bathing: Maximal assistance, Sit to/from stand Upper Body Dressing : Moderate assistance, Sitting Lower Body Dressing: Moderate assistance, Sit to/from stand Lower Body Dressing Details (indicate cue type and reason): able to don socks EOB, but requires assist to pull  pants over hips and assist for balance Toilet Transfer: Min guard, Ambulation, RW Toilet Transfer Details (indicate cue type and reason): cues for rw management but once instructed was able to maintain. Toileting- Clothing Manipulation  and Hygiene: Total assistance, Sit to/from stand Functional mobility during ADLs: Min guard, Rolling walker General ADL Comments: Pt performing four part trail making task to challenge L inattention, balance, and cognition. Pt running into objects on L side ~50% of time (ie.e door frames, chairs, the wall). Pt requiring cues for recalling the four locations, problem solving finding third room, and maintaining attention. Pt very motivated. Able to verbalize deficits with Mod cues for increasing insight. Reviewing task at end of session; perseverating on main deficits being "not falling"  Cognition: Cognition Overall Cognitive Status: Impaired/Different from baseline Arousal/Alertness: Awake/alert Orientation Level: Oriented X4 Attention: Selective Selective Attention: Impaired Selective Attention Impairment: Verbal basic, Functional basic Memory: Appears intact Awareness: Impaired Awareness Impairment: Intellectual impairment Problem Solving: Impaired Problem Solving Impairment: Verbal complex Executive Function: Self Monitoring, Organizing Organizing: Impaired Organizing Impairment: Verbal basic Self Monitoring: Impaired Behaviors: Impulsive Safety/Judgment: Impaired Cognition Arousal/Alertness: Awake/alert Behavior During Therapy: WFL for tasks assessed/performed Overall Cognitive Status: Impaired/Different from baseline Area of Impairment: Following commands, Safety/judgement, Awareness, Problem solving, Memory, Attention Current Attention Level: Sustained, Selective (Reports having to "work hard" to focus when in distracting environment) Memory: Decreased short-term memory Following Commands: Follows one step commands consistently, Follows multi-step commands inconsistently Safety/Judgement: Decreased awareness of safety, Decreased awareness of deficits Awareness: Emergent Problem Solving: Difficulty sequencing, Requires verbal cues, Requires tactile cues General Comments:  focused session on challenging ST memory, awareness, attention, and problem solving deficits. Pt participating in four part trail making task. Requiring Mod cues for completion. When providing instructions for four part trail making, pt requiring 5 repeats of directions due to decreased ST memory and attention  Physical Exam: Blood pressure (!) 158/76, pulse 60, temperature 99 F (37.2 C), temperature source Oral, resp. rate 16, height 5\' 6"  (1.676 m), weight 84.5 kg, last menstrual period 04/07/2019, SpO2 99 %. Physical Exam Constitutional:      General: She is not in acute distress.    Appearance: She is obese.  HENT:     Head: Normocephalic and atraumatic.     Right Ear: External ear normal.     Left Ear: External ear normal.     Nose: Nose normal.  Eyes:     General:        Right eye: No discharge.        Left eye: No discharge.     Extraocular Movements: Extraocular movements intact.  Cardiovascular:     Rate and Rhythm: Normal rate and regular rhythm.  Pulmonary:     Effort: Pulmonary effort is normal. No respiratory distress.     Breath sounds: No stridor.  Abdominal:     General: Abdomen is flat. There is no distension.  Musculoskeletal:     Cervical back: Normal range of motion and neck supple.     Comments: No edema or tenderness in extremities  Skin:    General: Skin is warm and dry.  Neurological:     Mental Status: She is alert and oriented to person, place, and time.     Comments: Alert Makes good eye contact with examiner.   Motor: LUE/LLE: 4+/5 proximal distal RUE/RLE: 5/5 proximal to distal Sensation intact light touch Mild ataxia left upper extremity Apraxia Some difficulty with two-step commands  Psychiatric:        Mood  and Affect: Mood normal.        Behavior: Behavior normal.    No results found for this or any previous visit (from the past 48 hour(s)). No results found.     Medical Problem List and Plan: 1.  Left-sided weakness secondary to  right MCA infarct due to right ICA occlusion status post tPA complicated by small SAH with later right ICA stenting complicated by right femoral artery pseudoaneurysm with repair 08/15/2020 per vascular surgery  -patient may shower  -ELOS/Goals: 4 to 6 days/revision/mod I  Admit to CIR 2.  Antithrombotics: -DVT/anticoagulation: Lovenox Mechanical: Sequential compression devices, entire leg Bilateral lower extremities Pharmaceutical: Lovenox  -antiplatelet therapy: Aspirin 81 mg daily and Brilinta 90 mg twice daily 3. Pain Management: Tramadol as needed 4. Mood: Paxil 10 mg daily  -antipsychotic agents: N/A 5. Neuropsych: This patient is capable of making decisions on her own behalf. 6. Skin/Wound Care: Routine skin checks 7. Fluids/Electrolytes/Nutrition: Routine in and outs  CMP ordered for tomorrow 8.  Hyperlipidemia: Lipitor 9.  GERD.  Protonix 10.  Hypothyroidism: Synthroid 11.  Hypokalemia  Potassium 3.0 on 8/12, labs ordered for tomorrow   Charlton Amor, PA-C 08/22/2020  I have personally performed a face to face diagnostic evaluation, including, but not limited to relevant history and physical exam findings, of this patient and developed relevant assessment and plan.  Additionally, I have reviewed and concur with the physician assistant's documentation above.  Maryla Morrow, MD, ABPMR  The patient's status has not changed. Any changes from the pre-admission screening or documentation from the acute chart are noted above.   Maryla Morrow, MD, ABPMR

## 2020-08-22 NOTE — TOC Benefit Eligibility Note (Signed)
Patient Product/process development scientist completed.    The patient is currently admitted and upon discharge could be taking Brilinta 90 mg.  The current 30 day co-pay is, $35.00.   The patient is insured through [employer] of [facility]      Roland Earl, CPhT Pharmacy Patient Advocate Specialist [facility] Antimicrobial Stewardship Team Direct Number: [phone]  Fax: [phone]

## 2020-08-22 NOTE — Discharge Instructions (Addendum)
Vicki Gonzales,   You have been hospitalized due to a stroke that occurred to the right side of the brain, affecting the left side of your body. You underwent stenting of your right internal carotid artery to help prevent further strokes and to prevent this one from being devastating.   To prevent future strokes please take Aspirin and Brilinta together. Also take Atorvastatin to help reduce your risk of having a future stroke.   We are unsure why you had your stroke and will continue to look into possible etiologies at your follow up appointments.    You are to follow up with Interventional Radiology, Neurology and Cardiology after you leave the hospital. Expect a call regarding these appointments.   It was a pleasure caring for you. Your medical team wishes you the best.   - The Stroke Team

## 2020-08-22 NOTE — Discharge Summary (Addendum)
Stroke Discharge Summary  Patient ID: Vicki Gonzales       MRN: [id number]      DOB: 1965-05-17  Date of Admission: 08/12/2020 Date of Discharge: 08/22/2020  Attending Physician:  Dr. Vernell Gonzales):  Cardiology  Patient's PCP:  Vicki Bussing, MD  Discharge Diagnoses:  Active Problems:   Acute right MCA stroke [facility])   Internal carotid artery occlusion, right s/p mechanical thrombectomy and rescue stenting   Patent foramen ovale Obstructive sleep apnoea- participating in Sleep smart study randomized to CPAP treatment arm   Medications to be continued on Rehab Allergies as of 08/22/2020       Reactions   Keflex [cephalexin]         Medication List     TAKE these medications    acetaminophen 500 MG tablet Commonly known as: TYLENOL Take 500 mg by mouth every 6 (six) hours as needed for mild pain, fever or headache.   aspirin 81 MG chewable tablet Chew 1 tablet (81 mg total) by mouth daily. Start taking on: August 23, 2020   atorvastatin 80 MG tablet Commonly known as: LIPITOR Take 1 tablet (80 mg total) by mouth daily. Start taking on: August 23, 2020   levothyroxine 50 MCG tablet Commonly known as: SYNTHROID Take 50 mcg by mouth every morning.   lisinopril-hydrochlorothiazide 20-12.5 MG tablet Commonly known as: ZESTORETIC Take 1 tablet by mouth daily.   PARoxetine 10 MG tablet Commonly known as: PAXIL Take 10 mg by mouth daily.   ticagrelor 90 MG Tabs tablet Commonly known as: BRILINTA Take 1 tablet (90 mg total) by mouth 2 (two) times daily.       Pertinent Labs and Imaging   08/12/20 CT Head  1. Acute infarct in the right insula and frontal operculum, ASPECTS is 8. 2.  Equivocal hyperdensity of the right M1. 3. No acute hemorrhage.  08/12/20 CT Angio Head and Neck  1. 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 2. Finding suspicious for a subocclusive embolus versus severe stenosis at the right MCA bifurcation. 3.  Occlusion of the left vertebral artery at its origin with distal reconstitution. 4. CTP results as detailed above including a 25 mL penumbra.  08/12/20 MRI Brain WO Contrast  Positive for small volume subarachnoid hemorrhage.  08/13/20 MRI Brain WO Contrast  1. Multifocal areas of right MCA territory infarct including a new area in the right perirolandic region when compared to prior CT. 2. Few small foci of susceptibility artifact within the areas of restricted diffusion corresponding to petechial hemorrhage. 3. Occlusion of the intracranial right ICA with reconstitution at the ophthalmic segment. 4. Occlusion of a right M3/MCA anterior division branch. 5. Stable trace right subarachnoid hemorrhage. 6. Moderate chronic white matter disease.  08/13/20 MRA Head WO Contrast 1. Multifocal areas of right MCA territory infarct including a new area in the right perirolandic region when compared to prior CT. 2. Few small foci of susceptibility artifact within the areas of restricted diffusion corresponding to petechial hemorrhage. 3. Occlusion of the intracranial right ICA with reconstitution at the ophthalmic segment. 4. Occlusion of a right M3/MCA anterior division branch. 5. Stable trace right subarachnoid hemorrhage. 6. Moderate chronic white matter disease.  History of Present Illness  55 yo woman with pmhx HTN, depression, migraines hypothyroidism BIB EMS after developing acute osnet of L facial droop and slurred speech at 0900 this AM. On my examination slurred speech had resolved and she had only L NLF flattening for NIHSS =  0. tPA was initially not administered 2/2 low NIHSS. CT head wo contrast showed hypodensity R insula and frontal operculum, ASPECTS 8, with equivocal hyperdensity of the R M1. CTA was performed which showed prox R ICA occlusion with intracranial reconstitution, finding suspicious for subocclusive embolus vs severe stenosis at the R MCA bifurcation, occlusion of L vert at origin  with distal reconstitution, and CTP showing 25cc penumbra R MCA territory.   Based on CTA/CTP findings I called Dr. Corliss Gonzales who recommended administration of tPA and activation of Code IR. I discussed the risks/benefits of both tPA and thrombectomy with the patient, including 10% risk of hemorrhage with the latter, and she gave informed consent. She had no exclusion criteria for tPA. She did have a slip out of bed this morning and bumped her head on the dresser lightly but there was no LOC or other sx and no blood on CT head. Patient is not on anticoagulation.  Discharge Examination:  General -obese middle-age Caucasian lady, in no apparent distress.   Cardiovascular - Regular rhythm and rate.   Mental Status -  Alert. Oriented x 3. Language including expression, naming, repetition, comprehension was assessed and found intact. No aphasia.   Cranial Nerves II - XII - II - Visual field intact OU. III, IV, VI - Extraocular movements intact. V - Facial sensation intact bilaterally. VII - facial symmetrical except slight nasolabial fold asymmetry when she smiles on the left. VIII - Hearing & vestibular intact bilaterally. X - Palate elevates symmetrically. XII - Tongue protrusion intact.   Motor Strength - left is slightly more weak than right 4/5, Right arm and leg 5/5.  Diminished fine finger movements on the left.  Trace left grip weakness.   Reflexes - The patient's reflexes were symmetrical in all extremities and she had no pathological reflexes.   Sensory - Light touch, temperaturewere assessed and showed left upper extremity reduced to light touch, and left lower extremities decreased light touch and no neglect to double stimulation.    Coordination - The patient had normal movements in the right hand with no ataxia or dysmetria. Tremor was absent.   Gait and Station - deferred.  Assessment and Plan: [name] is a 55 y.o. female with history of HTN, migraine  presenting with left facial droop and slurry speech.    Stroke: Right MCA infarct due to right ICA occlusion s/p tPA complicated with small SAH and later right ICA stenting, cryptogenic etiology CT head code stroke showed an  acute infarct in the right insula and frontal operculum, ASPECTS is 8. CTA head and neck - right ICA occlusion and right M1/M2 non-occlusive clot vs. High grade stenosis Serial CT Head stable small SAH right frontal. MRI SWI only - Positive for small volume subarachnoid hemorrhage. MRI: repeat: right MCA multifocal moderate sized infarcts, stable trace SAH right frontal.  MRA  occlusion of right ICA reconstitution at ophthalmic segment. Occlusion of right M3.  Left VA occlusion, age indeterminate 8/9 s/p right ICA revascularization and stent 8/10 CUS right ICA stent patent 2D Echo EF 60-65% TEE showed normal EF but possible PFO TCD bubble study showed [name] degree II with Valsalva LE venous Doppler no DVT Will consider 30 day cardiac event monitoring as an outpatient. She has a stroke follow up appointment referral placed LDL 172 on atorvastatin 80mg   HgbA1c 6.2 Hypercoagulable labs are negative VTE prophylaxis - lovenox No antithrombotic prior to admission, now on ASA 81 mg and Brilinta 90 mg  BID after right ICA revascularization and stenting. Per IR she is to continue DAPT with Aspirin and Brilinta for at least 6 months. She has follow up with them scheduled for 3-4 weeks from discharge.  Therapy recommendations:  CIR   Trace right frontal SAH post tPA Fibrinogen level 248 Serial CT stable SAH No reversal needed MRI showed trace SAH right frontal, unchanged   Right ICA occlusion Per Dr. Corliss Gonzales  S/p revascularization and stenting of RICA 8/9 On aspirin and brilinta   Right femoral artery pseudoaneurysm Post op right groin hematoma Doppler showed small 2.3 x 2 pseudoaneurysm Vascular surgery consulted -> s/p repair. Signed off today.   PFO TEE showed  possible PFO TCD bubble study showed [name] degree 1 at rest, degree 2 with Valsalva, indicating a small PFO Rope score = 6 Will refer to Dr. Excell Seltzer w/Cardiology as outpatient for further evaluation outpatient    Hypertension Home meds:  Zestoretic SBP in the 140-150 range currently, can resume anti hypertensive's at rehab  Long-term BP goal normotensive   Hyperlipidemia Home meds:  none LDL 172, goal < 70  Continue statin at discharge   Other Stroke Risk Factors Former cigarette smoker, advised to stay a non-smoker.  Discussed at length to avoid smoking. ETOH use, alcohol level <10, advised to drink no more than 1 drink a day Migraines OSA- agreed to participate in sleep smart study- randomized to CPAP treatment arm  Stark Jock, NP  Stroke Service Nurse Practitioner  Patient discussed with attending physician Dr. Pearlean Brownie   35 minutes were spent preparing discharge.  I have personally obtained history,examined this patient, reviewed notes, independently viewed imaging studies, participated in medical decision making and plan of care.ROS completed by me personally and pertinent positives fully documented  I have made any additions or clarifications directly to the above note. Agree with note above.    Delia Heady, MD Medical Director [facility] Stroke Center Pager: [phone] 08/22/2020 2:26 PM

## 2020-08-22 NOTE — Progress Notes (Signed)
Inpatient Rehabilitation Medication Review by a Pharmacist  A complete drug regimen review was completed for this patient to identify any potential clinically significant medication issues.  High Risk Drug Classes Is patient taking? Indication by Medication  Antipsychotic No   Anticoagulant yes For DVT prophylaxis  Antibiotic No   Opioid Yes For moderate to severe pain  Antiplatelet yes For CVA prophylaxis  INsulin no   Vasoactive Medication No   Chemotherapy No   Other No      Type of Medication Issue Identified Description of Issue Recommendation(s)  Drug Interaction(s) (clinically significant)     Duplicate Therapy     Allergy     No Medication Administration End Date     Incorrect Dose     Additional Drug Therapy Needed     Significant med changes from prior encounter (inform family/care partners about these prior to discharge).    Other  Patient on lisinopril/hctz PTA.  Did not receive while inpt.  Decision delayed as to whether to resume therapy     Clinically significant medication issues were identified that warrant physician communication and completion of prescribed/recommended actions by midnight of the next day:  Yes  Name of provider notified for urgent issues identified:   Provider Method of Notification:     Pharmacist comments:   Time spent performing this drug regimen review (minutes):    Vicki Gonzales 08/22/2020 9:04 PM

## 2020-08-22 NOTE — Discharge Instructions (Addendum)
Inpatient Rehab Discharge Instructions  Vicki Gonzales Discharge date and time: No discharge date for patient encounter.   Activities/Precautions/ Functional Status: Activity: As tolerated Diet: Regular Wound Care: Routine skin checks Functional status:  ___ No restrictions     ___ Walk up steps independently ___ 24/7 supervision/assistance   ___ Walk up steps with assistance ___ Intermittent supervision/assistance  ___ Bathe/dress independently ___ Walk with walker     __x_ Bathe/dress with assistance ___ Walk Independently    ___ Shower independently ___ Walk with assistance    ___ Shower with assistance ___ No alcohol     ___ Return to work/school ________  Special Instructions: No driving smoking or alcohol    COMMUNITY REFERRALS UPON DISCHARGE:    Outpatient: PT, OT, SP             Agency:[facility] [address] [place] [address] Phone:[phone]              Appointment Date/Time:WILL CALL HUSBAND TO SET UP APPOINTMENTS  Medical Equipment/Items Ordered:HAS ALL NEEDED EQUIPMENT                                                 Agency/Supplier:NA    My questions have been answered and I understand these instructions. I will adhere to these goals and the provided educational materials after my discharge from the hospital.  Patient/Caregiver Signature _______________________________ Date __________  Clinician Signature _______________________________________ Date __________  Please bring this form and your medication list with you to all your follow-up doctor's appointments.  STROKE/TIA DISCHARGE INSTRUCTIONS SMOKING Cigarette smoking nearly doubles your risk of having a stroke & is the single most alterable risk factor  If you smoke or have smoked in the last 12 months, you are advised to quit smoking for your health. Most of the excess cardiovascular risk related to smoking disappears within a year of stopping. Ask you doctor about  anti-smoking medications [place] Quit Line: 1-800-QUIT NOW Free Smoking Cessation Classes [phone]  CHOLESTEROL Know your levels; limit fat & cholesterol in your diet  Lipid Panel     Component Value Date/Time   CHOL 253 (H) 08/12/2020 1628   TRIG 217 (H) 08/12/2020 1628   HDL 38 (L) 08/12/2020 1628   CHOLHDL 6.7 08/12/2020 1628   VLDL 43 (H) 08/12/2020 1628   LDLCALC 172 (H) 08/12/2020 1628     Many patients benefit from treatment even if their cholesterol is at goal. Goal: Total Cholesterol (CHOL) less than 160 Goal:  Triglycerides (TRIG) less than 150 Goal:  HDL greater than 40 Goal:  LDL (LDLCALC) less than 100   BLOOD PRESSURE American Stroke Association blood pressure target is less that 120/80 mm/Hg  Your discharge blood pressure is:  BP: (!) 160/70 Monitor your blood pressure Limit your salt and alcohol intake Many individuals will require more than one medication for high blood pressure  DIABETES (A1c is a blood sugar average for last 3 months) Goal HGBA1c is under 7% (HBGA1c is blood sugar average for last 3 months)  Diabetes: No known diagnosis of diabetes    Lab Results  Component Value Date   HGBA1C 6.2 (H) 08/12/2020    Your HGBA1c can be lowered with medications, healthy diet, and exercise. Check your blood sugar as directed by your physician Call your physician if you experience unexplained or low  blood sugars.  PHYSICAL ACTIVITY/REHABILITATION Goal is 30 minutes at least 4 days per week  Activity: Increase activity slowly, Therapies: Physical Therapy: Home Health Return to work:  Activity decreases your risk of heart attack and stroke and makes your heart stronger.  It helps control your weight and blood pressure; helps you relax and can improve your mood. Participate in a regular exercise program. Talk with your doctor about the best form of exercise for you (dancing, walking, swimming, cycling).  DIET/WEIGHT Goal is to maintain a healthy weight  Your  discharge diet is:  Diet Order             Diet Heart Room service appropriate? Yes with Assist; Fluid consistency: Thin  Diet effective now                   liquids Your height is:  Height: 5\' 6"  (167.6 cm) Your current weight is: Weight: 83.1 kg Your Body Mass Index (BMI) is:  BMI (Calculated): 29.58 Following the type of diet specifically designed for you will help prevent another stroke. Your goal weight range is:   Your goal Body Mass Index (BMI) is 19-24. Healthy food habits can help reduce 3 risk factors for stroke:  High cholesterol, hypertension, and excess weight.  RESOURCES Stroke/Support Group:  Call [phone]   STROKE EDUCATION PROVIDED/REVIEWED AND GIVEN TO PATIENT Stroke warning signs and symptoms How to activate emergency medical system (call 911). Medications prescribed at discharge. Need for follow-up after discharge. Personal risk factors for stroke. Pneumonia vaccine given: No Flu vaccine given: No My questions have been answered, the writing is legible, and I understand these instructions.  I will adhere to these goals & educational materials that have been provided to me after my discharge from the hospital.

## 2020-08-22 NOTE — Progress Notes (Signed)
Inpatient Rehab Admissions Coordinator:  There is a bed available for pt in CIR today.  Stark Jock, NP is aware and in agreement. Pt, NSG, and TOC made aware.   Wolfgang Phoenix, MS, CCC-SLP Admissions Coordinator [phone]

## 2020-08-22 NOTE — Progress Notes (Signed)
Signed                                                                                                                                                                                                                                                                                                                                                                    PMR Admission Coordinator Pre-Admission Assessment   Patient: Vicki Gonzales is an 55 y.o., female MRN: [id number] DOB: May 31, 1965 Height:   Weight: 84.5 kg   Insurance Information HMO:     PPO: Yes     PCP:       IPA:       80/20:       OTHER: Group [id number] PRIMARY: BCBS Commercial       Policy#: [id number]      Subscriber: [name]: [name]      Phone#: [phone]     Fax#: [phone] Pre-Cert#: [id number]      Employer: [employer] Benefits:  Phone #: [phone]     Name:   Eff. Date: 09/07/19     Deduct: $5000 (met $198.74)      Out of Pocket Max: $7500 (met $1382.06)      Life Max: N/A CIR: 80%      SNF: 80% with 60 days limit/year Outpatient: 30 visit limit combined     Co-Pay: $50/visit Home Health: 80%      Co-Pay: 20% DME: 80%     Co-Pay: 20% Providers: in network  SECONDARY: none        Financial Counselor:       Phone#:    The Therapist, art Information Summary" for patients in Inpatient Rehabilitation Facilities with attached "Privacy Act [place] Records" was provided  and verbally reviewed with: N/A   Emergency Contact Information Contact Information       Name Relation Home Work Mobile    [place] Spouse [phone]   [phone]           Current Medical History  Patient Admitting Diagnosis: CVA   History of Present Illness: A 55 y.o. female with PMH significant for HTN, hypothyroidism, migraines, spondylosis, who  presented to [facility] ED  08/12/20 with dysarthric speech and a left facial droop and she was found to have a concern for right MCA stroke on CT head without contrast.  Her aspect score was an 8 with hypodensity in the right insula in the frontal curriculum.  CTA demonstrate a proximal ICA occlusion with distal intracranial reconstitution and possible subocclusive right MCA thrombus at the bifurcation with CT perfusion demonstrating a 25 cc penumbra in the right MCA territory. Given her symptoms and concern for progression of her symptoms to a full-blown right MCA stroke, tPA was administered and the case was discussed with interventionalist team and a code IR was activated.  She was transferred to [facility] 08/12/20  for potential thrombectomy and angioplasty. On arrival to [facility], she was noted to have elevated blood pressure to 427 systolic by 062 diastolic per EMS.  Her blood pressure was not treated by EMS enroute. Initial plan was to evaluate her and take her to thrombectomy suite directly.  However, she endorsed a right frontal throbbing headache behind her right eye with some right occipital headache which she describes as an 8 out of 10 that started sometimes after tPA was administered.  She has never had a headache like this in the past.  So instead of taking her to IR, a stat CT head was obtained which demonstrated new small superficial densities in the right frontal lobe and they were indeterminate for trace subarachnoid hemorrhage versus cortical contrast staining versus cortical petechial hemorrhage.  MRI brain notable for a positive small volume subarachnoid hemorrhage. Pt. Underwent revascularization of occluded Rt ICA prox with stent assisted angioplasty 08/14/20. CIR was assisted in return  to PLOF.    Complete NIHSS TOTAL: 4   Patient's medical record from [facility]  has been reviewed by the rehabilitation admission coordinator and physician.   Past Medical History       Past Medical History:  Diagnosis Date   ADHD     Anxiety     Carpal tunnel syndrome on right     Depression     HTN (hypertension)     Hypothyroidism     Migraines     Spondylosis        Family History   family history is not on file.   Prior Rehab/Hospitalizations Has the patient had prior rehab or hospitalizations prior to admission? No   Has the patient had major surgery during 100 days prior to admission? Yes              Current Medications   Current Facility-Administered Medications:     stroke: mapping our early stages of recovery book, , Does not apply, Once, Donnetta Simpers, MD   0.9 %  sodium chloride infusion, , Intravenous, Continuous, Rosalin Hawking, MD, Stopped at 08/14/20 1422   0.9 %  sodium chloride infusion, , Intravenous, Continuous, Deveshwar, Sanjeev, MD, Last Rate: 75 mL/hr at 08/15/20 0600, Infusion Verify at 08/15/20 0600   acetaminophen (TYLENOL) tablet 650 mg, 650 mg, Oral, Q4H PRN **OR** acetaminophen (TYLENOL) 160 MG/5ML solution  650 mg, 650 mg, Per Tube, Q4H PRN **OR** acetaminophen (TYLENOL) suppository 650 mg, 650 mg, Rectal, Q4H PRN, Donnetta Simpers, MD   acetaminophen (TYLENOL) tablet 650 mg, 650 mg, Oral, Q4H PRN, 650 mg at 08/14/20 2049 **OR** acetaminophen (TYLENOL) 160 MG/5ML solution 650 mg, 650 mg, Per Tube, Q4H PRN **OR** acetaminophen (TYLENOL) suppository 650 mg, 650 mg, Rectal, Q4H PRN, Deveshwar, Sanjeev, MD   aspirin chewable tablet 81 mg, 81 mg, Oral, Daily **OR** aspirin chewable tablet 81 mg, 81 mg, Per Tube, Daily, Deveshwar, Sanjeev, MD   atorvastatin (LIPITOR) tablet 80 mg, 80 mg, Oral, Daily, Rosalin Hawking, MD, 80 mg at 08/13/20 1013   Chlorhexidine Gluconate Cloth 2 % PADS 6 each, 6 each, Topical, Daily, Donnetta Simpers, MD, 6 each at 08/14/20 0850   clevidipine (CLEVIPREX) infusion 0.5 mg/mL, 0-21 mg/hr, Intravenous, Continuous, Deveshwar, Sanjeev, MD, Last Rate: 8 mL/hr at 08/14/20 1934, 4 mg/hr at 08/14/20 1934    eptifibatide (INTEGRILIN) injection, , , PRN, Deveshwar, Sanjeev, MD, 1.5 mg at 08/14/20 1356   iohexol (OMNIPAQUE) 300 MG/ML solution 50 mL, 50 mL, Intra-arterial, Once PRN, Deveshwar, Sanjeev, MD   iohexol (OMNIPAQUE) 300 MG/ML solution 50 mL, 50 mL, Intra-arterial, Once PRN, Deveshwar, Sanjeev, MD   levothyroxine (SYNTHROID) tablet 50 mcg, 50 mcg, Oral, Q0600, Donnetta Simpers, MD, 50 mcg at 08/15/20 0534   niMODipine (NIMOTOP) capsule 60 mg, 60 mg, Oral, Once, [place], Jennifer C, NP   ondansetron (ZOFRAN) injection 4 mg, 4 mg, Intravenous, Q6H PRN, Rosalin Hawking, MD, 4 mg at 08/15/20 0820   pantoprazole (PROTONIX) EC tablet 40 mg, 40 mg, Oral, Daily, Rosalin Hawking, MD, 40 mg at 08/13/20 1524   PARoxetine (PAXIL) tablet 10 mg, 10 mg, Oral, Daily, Donnetta Simpers, MD, 10 mg at 08/13/20 1013   senna-docusate (Senokot-S) tablet 1 tablet, 1 tablet, Oral, BID, Donnetta Simpers, MD   ticagrelor (BRILINTA) tablet 90 mg, 90 mg, Oral, BID, 90 mg at 08/14/20 2049 **OR** ticagrelor (BRILINTA) tablet 90 mg, 90 mg, Per Tube, BID, Deveshwar, Sanjeev, MD   traMADol (ULTRAM) tablet 50 mg, 50 mg, Oral, Q6H PRN, Rosalin Hawking, MD, 50 mg at 08/14/20 1724   verapamil (ISOPTIN) injection, , , PRN, Deveshwar, Sanjeev, MD, 2.5 mg at 08/14/20 1329     Precautions / Restrictions Precautions Precautions: Fall Precaution Comments: L inattention, SBP < 140 Restrictions Weight Bearing Restrictions: No    Has the patient had 2 or more falls or a fall with injury in the past year? No   Prior Activity Level Community (5-7x/wk): Pt. was active in the community PTA   Prior Functional Level Self Care: Did the patient need help bathing, dressing, using the toilet or eating? Independent   Indoor Mobility: Did the patient need assistance with walking from room to room (with or without device)? Independent   Stairs: Did the patient need assistance with internal or external stairs (with or without device)?  Independent   Functional Cognition: Did the patient need help planning regular tasks such as shopping or remembering to take medications? Independent   Home Assistive Devices / Equipment Home Assistive Devices/Equipment: None Home Equipment: Shower seat, Hand held shower head, Walker - 2 wheels   Prior Device Use: Indicate devices/aids used by the patient prior to current illness, exacerbation or injury? None of the above   Current Functional Level Cognition   Arousal/Alertness: Awake/alert Overall Cognitive Status: Impaired/Different from baseline Current Attention Level: Sustained, Selective (Reports having to "work hard" to focus when in distracting environment) Orientation Level: Oriented  X4 Following Commands: Follows one step commands consistently, Follows multi-step commands inconsistently Safety/Judgement: Decreased awareness of safety, Decreased awareness of deficits General Comments: focused session on challenging ST memory, awareness, attention, and problem solving deficits. Pt participating in four part trail making task. Requiring Mod cues for completion. When providing instructions for four part trail making, pt requiring 5 repeats of directions due to decreased ST memory and attention Attention: Selective Selective Attention: Impaired Selective Attention Impairment: Verbal basic, Functional basic Memory: Appears intact Awareness: Impaired Awareness Impairment: Intellectual impairment Problem Solving: Impaired Problem Solving Impairment: Verbal complex Executive Function: Self Monitoring, Organizing Organizing: Impaired Organizing Impairment: Verbal basic Self Monitoring: Impaired Behaviors: Impulsive Safety/Judgment: Impaired    Extremity Assessment (includes Sensation/Coordination)   Upper Extremity Assessment: LUE deficits/detail LUE Deficits / Details: mild dysmetria noted. she is able to manipulate objects in Lt hand independently as long as she is looking at  her hand due to Lt inattention LUE Sensation: decreased proprioception LUE Coordination: decreased gross motor, decreased fine motor  Lower Extremity Assessment: Defer to PT evaluation RLE Deficits / Details: Strength 5/5 LLE Deficits / Details: Grossly 5/5 except hip flexion 3+/5 LLE Sensation: decreased light touch     ADLs   Overall ADL's : Needs assistance/impaired Eating/Feeding: Supervision/ safety, Sitting, Bed level Grooming: Wash/dry hands, Wash/dry face, Brushing hair, Supervision/safety, Sitting Upper Body Bathing: Moderate assistance, Sitting Lower Body Bathing: Maximal assistance, Sit to/from stand Upper Body Dressing : Moderate assistance, Sitting Lower Body Dressing: Moderate assistance, Sit to/from stand Lower Body Dressing Details (indicate cue type and reason): able to don socks EOB, but requires assist to pull pants over hips and assist for balance Toilet Transfer: Min guard, Ambulation, RW Toilet Transfer Details (indicate cue type and reason): cues for rw management but once instructed was able to maintain. Toileting- Clothing Manipulation and Hygiene: Total assistance, Sit to/from stand Functional mobility during ADLs: Min guard, Rolling walker General ADL Comments: Pt performing four part trail making task to challenge L inattention, balance, and cognition. Pt running into objects on L side ~50% of time (ie.e door frames, chairs, the wall). Pt requiring cues for recalling the four locations, problem solving finding third room, and maintaining attention. Pt very motivated. Able to verbalize deficits with Mod cues for increasing insight. Reviewing task at end of session; perseverating on main deficits being "not falling"     Mobility   Overal bed mobility: Independent Bed Mobility: Supine to Sit, Sit to Supine Rolling: Min guard Sidelying to sit: Min guard Supine to sit: Independent Sit to supine: Independent Sit to sidelying: Min guard General bed mobility  comments: Patient came to sitting edge of bed without assist. ALthough trying to get up with legs still attached to compression pumps. Decreased awareness.     Transfers   Overall transfer level: Needs assistance Equipment used: None Transfers: Sit to/from Stand Sit to Stand: Min guard Stand pivot transfers: Min assist General transfer comment: min guard A for safety     Ambulation / Gait / Stairs / Wheelchair Mobility   Ambulation/Gait Ambulation/Gait assistance: Counsellor (Feet): 300 Feet Assistive device: None (holding to gait belt) Gait Pattern/deviations: Step-through pattern, Staggering right, Staggering left General Gait Details: min guard to min assist for balance. Patient reaching out for walls, railing in hallway for steadying at times. Became fatigued after walking about 150 feet and required standing rest at desk. Became less safe and steady with increased distance. And tried to enter room that was not hers. Decreased awareness  of objects on left. Gait velocity: decreased Gait velocity interpretation: 1.31 - 2.62 ft/sec, indicative of limited community ambulator     Posture / Balance Dynamic Sitting Balance Sitting balance - Comments: no support needed for sitting edge of bed Balance Overall balance assessment: Needs assistance Sitting-balance support: Feet supported Sitting balance-Leahy Scale: Good Sitting balance - Comments: no support needed for sitting edge of bed Standing balance support: Single extremity supported, During functional activity Standing balance-Leahy Scale: Fair Standing balance comment: requires single UE support about 50% of the time due to unsteadiness.     Special needs/care consideration Skin Ecchymosis: groin/right; Surgical incision: groin/right, Continuous drip IV:0.9% sodium chloride infusion    Previous Home Environment (from acute therapy documentation) Living Arrangements: Spouse/significant other, Children  Lives With:  Family Available Help at Discharge: Family, Available 24 hours/day Type of Home: House Home Layout: Two level Alternate Level Stairs-Rails: Right, Left Alternate Level Stairs-Number of Steps: 13 Home Access: Stairs to enter Entrance Stairs-Number of Steps: 1 Bathroom Shower/Tub: Scientist, research (life sciences): Yes How Accessible: Accessible via walker [place]: No Additional Comments: 1/2 bath main level   Discharge Living Setting Plans for Discharge Living Setting: Patient's home Type of Home at Discharge: House Discharge Home Layout: Two level, 1/2 bath on main level Alternate Level Stairs-Rails: Right, Left Alternate Level Stairs-Number of Steps: 13 Discharge Home Access: Stairs to enter Entrance Stairs-Rails: None Entrance Stairs-Number of Steps: 1 Discharge Bathroom Shower/Tub: Tub/shower unit Discharge Bathroom Toilet: Standard Discharge Bathroom Accessibility: Yes How Accessible: Accessible via walker Does the patient have any problems obtaining your medications?: No   Social/Family/Support Systems Patient Roles: Spouse Contact Information: [phone] Anticipated Caregiver: Khushboo Chuck Anticipated Caregiver's Contact Information: [phone] Ability/Limitations of Caregiver: Can provide Min A Caregiver Availability: 24/7 Discharge Plan Discussed with Primary Caregiver: Yes Is Caregiver In Agreement with Plan?: Yes Does Caregiver/Family have Issues with Lodging/Transportation while Pt is in Rehab?: No   Goals Patient/Family Goal for Rehab: PT/OT/SLP min A Expected length of stay: 7-10 days Pt/Family Agrees to Admission and willing to participate: Yes Program Orientation Provided & Reviewed with Pt/Caregiver Including Roles  & Responsibilities: Yes   Decrease burden of Care through IP rehab admission: Specialzed equipment needs, Decrease number of caregivers, Bowel and bladder program, and Patient/family education   Possible need for  SNF placement upon discharge: not anticipated    Patient Condition: I have reviewed medical records from [facility], spoken with CM, and patient and spouse. I met with patient at the bedside for inpatient rehabilitation assessment.  Patient will benefit from ongoing PT, OT, and SLP, can actively participate in 3 hours of therapy a day 5 days of the week, and can make measurable gains during the admission.  Patient will also benefit from the coordinated team approach during an Inpatient Acute Rehabilitation admission.  The patient will receive intensive therapy as well as Rehabilitation physician, nursing, social worker, and care management interventions.  Due to safety, skin/wound care, disease management, medication administration, pain management, and patient education the patient requires 24 hour a day rehabilitation nursing.  The patient is currently Independent-Min G with mobility and basic ADLs.  Discharge setting and therapy post discharge at home with home health is anticipated.  Patient has agreed to participate in the Acute Inpatient Rehabilitation Program and will admit today.   Preadmission Screen Completed By:  Clemens Catholic 08/15/20 and updated by: Retta Diones, with day of admission updates by Gayland Curry, [place], CCC-SLP 08/22/2020 10:38  AM ______________________________________________________________________   Discussed status with Dr. Posey Pronto on 08/22/20  at 10:38 AM and received approval for admission today.   Admission Coordinator:  Retta Diones, RN, with day of admission updates by Gayland Curry, MS, CCC-SLP time 10:38 AM/Date 08/22/20     Assessment/Plan: Diagnosis: CVA Does the need for close, 24 hr/day Medical supervision in concert with the patient's rehab needs make it unreasonable for this patient to be served in a less intensive setting? Potentially Co-Morbidities requiring supervision/potential complications: HTN (monitor and provide prns  in accordance with increased physical exertion and pain), hypothyroidism, migraines, spondylosis Due to safety, disease management, and patient education, does the patient require 24 hr/day rehab nursing? Potentially Does the patient require coordinated care of a physician, rehab nurse, PT, OT, and SLP to address physical and functional deficits in the context of the above medical diagnosis(es)? Potentially Addressing deficits in the following areas: balance, endurance, locomotion, bathing, dressing, toileting, and psychosocial support Can the patient actively participate in an intensive therapy program of at least 3 hrs of therapy 5 days a week? Yes The potential for patient to make measurable gains while on inpatient rehab is good Anticipated functional outcomes upon discharge from inpatient rehab: modified independent and supervision PT, modified independent and supervision OT, modified independent and supervision SLP Estimated rehab length of stay to reach the above functional goals is: 4-7 days. Anticipated discharge destination: Home 10. Overall Rehab/Functional Prognosis: good     MD Signature: Delice Lesch, MD, ABPMR

## 2020-08-22 NOTE — Progress Notes (Signed)
Pt arrived on unit. Reviewed plan of care, states an understanding of information reviewed. No complications noted at this time, Family present at bedside.

## 2020-08-23 DIAGNOSIS — I63511 Cerebral infarction due to unspecified occlusion or stenosis of right middle cerebral artery: Secondary | ICD-10-CM

## 2020-08-23 LAB — COMPREHENSIVE METABOLIC PANEL
ALT: 22 U/L (ref 0–44)
AST: 16 U/L (ref 15–41)
Albumin: 3.1 g/dL — ABNORMAL LOW (ref 3.5–5.0)
Alkaline Phosphatase: 62 U/L (ref 38–126)
Anion gap: 9 (ref 5–15)
BUN: 11 mg/dL (ref 6–20)
CO2: 25 mmol/L (ref 22–32)
Calcium: 9 mg/dL (ref 8.9–10.3)
Chloride: 105 mmol/L (ref 98–111)
Creatinine, Ser: 0.57 mg/dL (ref 0.44–1.00)
GFR, Estimated: >60 mL/min (ref >60)
Glucose, Bld: 115 mg/dL — ABNORMAL HIGH (ref 70–99)
Potassium: 3.7 mmol/L (ref 3.5–5.1)
Sodium: 139 mmol/L (ref 135–145)
Total Bilirubin: 1.6 mg/dL — ABNORMAL HIGH (ref 0.3–1.2)
Total Protein: 6.3 g/dL — ABNORMAL LOW (ref 6.5–8.1)

## 2020-08-23 LAB — CBC WITH DIFFERENTIAL/PLATELET
Abs Immature Granulocytes: 0.06 10*3/uL (ref 0.00–0.07)
Basophils Absolute: 0.1 10*3/uL (ref 0.0–0.1)
Basophils Relative: 1 %
Eosinophils Absolute: 0.4 10*3/uL (ref 0.0–0.5)
Eosinophils Relative: 3 %
HCT: 32.2 % — ABNORMAL LOW (ref 36.0–46.0)
Hemoglobin: 10.9 g/dL — ABNORMAL LOW (ref 12.0–15.0)
Immature Granulocytes: 1 %
Lymphocytes Relative: 15 %
Lymphs Abs: 1.8 10*3/uL (ref 0.7–4.0)
MCH: 28.7 pg (ref 26.0–34.0)
MCHC: 33.9 g/dL (ref 30.0–36.0)
MCV: 84.7 fL (ref 80.0–100.0)
Monocytes Absolute: 1 10*3/uL (ref 0.1–1.0)
Monocytes Relative: 8 %
Neutro Abs: 8.9 10*3/uL — ABNORMAL HIGH (ref 1.7–7.7)
Neutrophils Relative %: 72 %
Platelets: 438 10*3/uL — ABNORMAL HIGH (ref 150–400)
RBC: 3.8 MIL/uL — ABNORMAL LOW (ref 3.87–5.11)
RDW: 13.8 % (ref 11.5–15.5)
WBC: 12.3 10*3/uL — ABNORMAL HIGH (ref 4.0–10.5)
nRBC: 0 % (ref 0.0–0.2)

## 2020-08-23 NOTE — Progress Notes (Signed)
Physical Therapy Session Note  Patient Details  Name: Vicki Gonzales MRN: [id number] Date of Birth: [birth year]  Today's Date: 08/23/2020 PT Individual Time: 9396-8864 PT Individual Time Calculation (min): 60 min   Short Term Goals: Week 1:  PT Short Term Goal 1 (Week 1): STG=LTG due to ELOS  Skilled Therapeutic Interventions/Progress Updates:   Pt received supine in bed and agreeable to PT. Supine>sit transfer without assist and or cues. Stand pivot to WC with CGA. Nustep reciprocal movement training x 7 min cues for for full ROM on the L. PT instructed pt in DGI. See below for results. Demonstrates increased fall risk with score of 15/24. (<19 indicates increased fall risk)  Bits visual scanning single target and sequencing, Alphebet and then number/letter. Pt required Moderate cues for sequencing once beyond 5-E for 10 additional targets. Patient returned to room and performed stand pivot to recliner with no AD and supervision assist. Pt left sitting in recliner with call bell in reach and all needs met.       Therapy Documentation Precautions:  Precautions Precautions: Fall Precaution Comments: L inattention Restrictions Weight Bearing Restrictions: No    Vital Signs: Therapy Vitals Temp: 98.6 F (37 C) Temp Source: Oral Pulse Rate: 70 Resp: 18 BP: 103/90 Patient Position (if appropriate): Lying Oxygen Therapy SpO2: 98 % O2 Device: Room Air Pain: Pain Assessment Pain Scale: Faces Pain Score: 0-No pain     Therapy/Group: Individual Therapy  Lorie Phenix 08/23/2020, 4:20 PM

## 2020-08-23 NOTE — Evaluation (Signed)
Speech Language Pathology Assessment and Plan  Patient Details  Name: Vicki Gonzales MRN: [id number] Date of Birth: 02-02-1965  SLP Diagnosis: Cognitive Impairments  Rehab Potential: Excellent ELOS: 7-10 days   Today's Date: 08/23/2020 SLP Individual Time: 3546-5681 SLP Individual Time Calculation (min): 60 min  Hospital Problem: Principal Problem:   Right middle cerebral artery stroke [facility])  Past Medical History:  Past Medical History:  Diagnosis Date   ADHD    Anxiety    Carpal tunnel syndrome on right    Depression    HTN (hypertension)    Hypothyroidism    Migraines    Spondylosis    Past Surgical History:  Past Surgical History:  Procedure Laterality Date   ANTERIOR CRUCIATE LIGAMENT REPAIR Left 2021   APPENDECTOMY     BUBBLE STUDY  08/17/2020   Procedure: BUBBLE STUDY;  Surgeon: Buford Dresser, MD;  Location: [place];  Service: Cardiovascular;;   FALSE ANEURYSM REPAIR Right 08/15/2020   Procedure: REPAIR RIGHT FEMORAL PSEUDOANEURYSM;  Surgeon: Serafina Mitchell, MD;  Location: [facility] OR;  Service: Vascular;  Laterality: Right;   IR ANGIO INTRA EXTRACRAN SEL COM CAROTID INNOMINATE UNI L MOD SED  08/14/2020   IR ANGIO INTRA EXTRACRAN SEL INTERNAL CAROTID UNI R MOD SED  08/14/2020   IR ANGIO VERTEBRAL SEL SUBCLAVIAN INNOMINATE BILAT MOD SED  08/14/2020   IR CT HEAD LTD  08/14/2020   IR CT HEAD LTD  08/14/2020   IR INTRA CRAN STENT  08/14/2020   IR INTRAVSC STENT CERV CAROTID W/O EMB-PROT MOD SED INC ANGIO  08/14/2020   RADIOLOGY WITH ANESTHESIA N/A 08/14/2020   Procedure: IR WITH ANESTHESIA;  Surgeon: Luanne Bras, MD;  Location: [place];  Service: Radiology;  Laterality: N/A;   TEE WITHOUT CARDIOVERSION N/A 08/17/2020   Procedure: TRANSESOPHAGEAL ECHOCARDIOGRAM (TEE);  Surgeon: Buford Dresser, MD;  Location: [facility] ENDOSCOPY;  Service: Cardiovascular;  Laterality: N/A;   TUBAL LIGATION      Assessment / Plan / Recommendation Clinical Impression  HPI:  Vicki Gonzales is a 55 year old right-handed female with history of hypertension hypothyroidism, migraine headaches and tobacco use.  Patient lives with spouse independent prior to admission.  Two-level home with one-step to entry.  Good family support noted.  She presented on 08/12/2020 to [facility] with dysarthria and left hemiparesis.  Cranial CT scan unremarkable for acute intracranial process.  CT angiogram head and neck proximal right ICA occlusion with intracranial reconstitution.  Findings suspicious for subocclusive embolus versus severe stenosis at the right MCA bifurcation. Occlusion of the left vertebral artery its origin with distal reconstitution. Patient did receive tPA. MRI and MRA showed multifocal areas of right MCA territory infarct including a new area in the right perirolandic region. Few small foci of susceptibility artifact within the areas of restricted diffusion. Stable trace right subarachnoid hemorrhage. Patient was transferred to [facility] underwent four-vessel cerebral arteriogram with revascularization of occluded right ICA on 08/14/2020 per interventional radiology. Hospital course further complicated by post arteriogram right groin pain findings of right femoral pseudoaneurysm underwent repair of right femoral pseudoaneurysm 08/15/2020. Tolerating a regular consistency diet. Therapy evaluations completed due to patient's left-sided hemiparesis was admitted for a comprehensive rehab program.    Patient presents with mild cognitive-linguistic deficits characterized by decreased higher level problem solving and executive functions such as planning and organization. Although not observed during evaluation which took place in low stim environment, patient endorsed she is easy to distract suggestive of possible impaired selective attention.  Patient's auditory comprehension and verbal expression appeared grossly intact. Motor speech production was clear without indication or  concerns for dysarthria. She is tolerating a regular diet and thin liquids with no complaints at this time. Patient would benefit from skilled SLP intervention to maximize cognitive-linguistic function and functional independence prior to discharge. Anticipate patient would benefit from outpatient speech therapy services due to anticipated short length of stay.  [employer] Mental Status (SLUMS) administered: 24/30 (score of <27 considered impairment based on patient's educational history). See results below.  Orientation: 3/3 Word list recall: 4/5 Calculations: 3/3 Generative Naming: 2/3 Mental manipulation/digit reversal: 2/2 Clock Drawing/Executive Function: 2/4 Visuospatial: 2/2 Paragraph Recall: 6/8    Skilled Therapeutic Interventions          Administered cognitive-linguistic evaluation. Please see above.  SLP Assessment  Patient will need skilled [place] [facility] during CIR admission    Recommendations  Patient destination: Home Follow up Recommendations: Outpatient SLP Equipment Recommended: None recommended by SLP    SLP Frequency 3 to 5 out of 7 days   SLP Duration  SLP Intensity  SLP Treatment/Interventions 7-10 days  Minumum of 1-2 x/day, 30 to 90 minutes  Cognitive remediation/compensation;Environmental controls;Internal/external aids;Cueing hierarchy;Functional tasks;Patient/family education    Pain Pain Assessment Pain Scale: Faces Pain Score: 0-No pain  Prior Functioning Cognitive/Linguistic Baseline: Within functional limits Type of Home: House  Lives With: Family;Significant other;Son Available Help at Discharge: Family;Available 24 hours/day Vocation: Retired  Programmer, systems Overall Cognitive Status: Impaired/Different from baseline Arousal/Alertness: Awake/alert Orientation Level: Oriented X4 Attention: Selective Selective Attention: Impaired Selective Attention Impairment: Verbal basic;Functional  basic Memory: Appears intact Immediate Memory Recall: Sock;Blue;Bed Memory Recall Sock: Without Cue Memory Recall Blue: Without Cue Memory Recall Bed: Without Cue Awareness Impairment: Emergent impairment Problem Solving: Impaired Problem Solving Impairment: Verbal complex Executive Function: Self Monitoring;Organizing Organizing: Impaired Organizing Impairment: Verbal basic;Functional basic Self Monitoring: Impaired Safety/Judgment: Impaired Comments: Pt able to verbalize safety precautions/energy conservation techniques, cont to req cuing to enact into ADL.  Comprehension Auditory Comprehension Overall Auditory Comprehension: Appears within functional limits for tasks assessed Expression Expression Primary Mode of Expression: Verbal Verbal Expression Overall Verbal Expression: Appears within functional limits for tasks assessed Written Expression Dominant Hand: Right Oral Motor Oral Motor/Sensory Function Overall Oral Motor/Sensory Function: Within functional limits Motor Speech Overall Motor Speech: Appears within functional limits for tasks assessed  Care Tool Care Tool Cognition Expression of Ideas and Wants Expression of Ideas and Wants: Without difficulty (complex and basic) - expresses complex messages without difficulty and with speech that is clear and easy to understand   Understanding Verbal and Non-Verbal Content Understanding Verbal and Non-Verbal Content: Usually understands - understands most conversations, but misses some part/intent of message. Requires cues at times to understand   Memory/Recall Ability *first 3 days only Memory/Recall Ability *first 3 days only: Current season;Staff names and faces;That he or she is in a hospital/hospital unit      Short Term Goals: Week 1: SLP Short Term Goal 1 (Week 1): STG=LTG due to ELOS  Refer to Care Plan for Long Term Goals  Recommendations for other services: None   Discharge Criteria: Patient will be  discharged from SLP if patient refuses treatment 3 consecutive times without medical reason, if treatment goals not met, if there is a change in medical status, if patient makes no progress towards goals or if patient is discharged from hospital.  The above assessment, treatment plan, treatment alternatives and goals were discussed and mutually  agreed upon: by patient  Patty Sermons 08/23/2020, 4:10 PM

## 2020-08-23 NOTE — Progress Notes (Signed)
Patient ID: Vicki Gonzales, female   DOB: [birth year], 55 y.o.   MRN: [id number] Met with the patient and spouse to introduce self and the role of the nurse CM. Reviewed secondary stroke risks and the plan of care. Reviewed HH diet , HLD (LDL 172/Trig 217) and dietary modifications for pre-diabetes w A1C of 6.2. Reviewed sleep study and use of CPAP. Continue to follow along to discharge and address educational needs.  Margarito Liner

## 2020-08-23 NOTE — Progress Notes (Signed)
Inpatient Rehabilitation Care Coordinator Assessment and Plan Patient Details  Name: Vicki Gonzales MRN: [id number] Date of Birth: 1965/12/07  Today's Date: 08/23/2020  Hospital Problems: Principal Problem:   Right middle cerebral artery stroke [facility])  Past Medical History:  Past Medical History:  Diagnosis Date   ADHD    Anxiety    Carpal tunnel syndrome on right    Depression    HTN (hypertension)    Hypothyroidism    Migraines    Spondylosis    Past Surgical History:  Past Surgical History:  Procedure Laterality Date   ANTERIOR CRUCIATE LIGAMENT REPAIR Left 2021   APPENDECTOMY     BUBBLE STUDY  08/17/2020   Procedure: BUBBLE STUDY;  Surgeon: Jodelle Red, MD;  Location: [facility] ENDOSCOPY;  Service: Cardiovascular;;   FALSE ANEURYSM REPAIR Right 08/15/2020   Procedure: REPAIR RIGHT FEMORAL PSEUDOANEURYSM;  Surgeon: Nada Libman, MD;  Location[facility] OR;  Service: Vascular;  Laterality: Right;   IR ANGIO INTRA EXTRACRAN SEL COM CAROTID INNOMINATE UNI L MOD SED  08/14/2020   IR ANGIO INTRA EXTRACRAN SEL INTERNAL CAROTID UNI R MOD SED  08/14/2020   IR ANGIO VERTEBRAL SEL SUBCLAVIAN INNOMINATE BILAT MOD SED  08/14/2020   IR CT HEAD LTD  08/14/2020   IR CT HEAD LTD  08/14/2020   IR INTRA CRAN STENT  08/14/2020   IR INTRAVSC STENT CERV CAROTID W/O EMB-PROT MOD SED INC ANGIO  08/14/2020   RADIOLOGY WITH ANESTHESIA N/A 08/14/2020   Procedure: IR WITH ANESTHESIA;  Surgeon: Julieanne Cotton, MD;  Location: [facility] OR;  Service: Radiology;  Laterality: N/A;   TEE WITHOUT CARDIOVERSION N/A 08/17/2020   Procedure: TRANSESOPHAGEAL ECHOCARDIOGRAM (TEE);  Surgeon: Jodelle Red, MD;  Location: [facility] ENDOSCOPY;  Service: Cardiovascular;  Laterality: N/A;   TUBAL LIGATION     Social History:  reports that she has quit smoking. She has never used smokeless tobacco. No history on file for alcohol use and drug use.  Family / Support Systems Marital Status: Married Patient Roles: Spouse,  Parent Spouse/Significant Other: Jeannett Senior [phone] Children: Two children Other Supports: Friends Anticipated Caregiver: husband Ability/Limitations of Caregiver: Husband works and can be there within minutes if needed Caregiver Availability: Evenings only Family Dynamics: Close knit family who will make sure she has what is needed. She is doing quite well and recovering from this stroke. She and husband feel they have good supports.  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 Husband plans to be here daily yo provide support.   Abuse/Neglect Abuse/Neglect Assessment Can Be Completed: Yes Physical Abuse: Denies Verbal Abuse: Denies Sexual Abuse: Denies Exploitation of patient/patient's resources: Denies Self-Neglect: Denies  Emotional Status Pt's affect, behavior and adjustment status: Pt is able to verbalize and is making good progress, very upbeat after PT session hearing how well she will do here. She is hopeful she will regian her independence and not require care at discharge. Recent Psychosocial Issues: other health issues Psychiatric History: History of depression/anxiety takes medications for this and finds them helpful So glad she got to come to rehab due to needs therapies. Would benefit from seeing neuro-psych if can while here Substance Abuse History: Tobacco aware of the health affects and plans to try to quit, aware of the resources available to her  Patient / Family Perceptions, Expectations &  Goals Pt/Family understanding of illness & functional limitations: Pt and husband can explain her stroke and deficits. Both talk with the MD and feel they have a good understanding of her treatment plan going forward and are  excited about rehab Premorbid pt/family roles/activities: Wife, mom, friend, etc Anticipated changes in roles/activities/participation: resume Pt/family expectations/goals: Pt states: " I hope to be able to take care of myself when I leave here."  Husband states: " We hope she will do well, can already see progress which is good."  [employer]: None Premorbid Home Care/DME Agencies: Other (Comment) (has rw, grab bars and tub seat) Transportation available at discharge: Family Resource referrals recommended: Neuropsychology  Discharge Planning Living Arrangements: Spouse/significant other Support Systems: Spouse/significant other, Children, Friends/neighbors Type of Residence: Private residence Insurance Resources: Media planner (specify) Herbalist) Financial Resources: Family Support Financial Screen Referred: No Living Expenses: Lives with family Money Management: Patient, Spouse Does the patient have any problems obtaining your medications?: No Home Management: Patient Patient/Family Preliminary Plans: Return home with husband who does work but can be home quickly if needed. Pt reports she is safe and will not try to do things she can not do. So pt will be home alone at times and need to be safe with ambulation and/or transfers. Will await therapy team evaluations. Care Coordinator Barriers to Discharge: Decreased caregiver support, Insurance for SNF coverage Care Coordinator Anticipated Follow Up Needs: HH/OP  Clinical Impression Pleasant couple who are very supportive of one another. Pt is hopeful she will get to mod/I level before going home. Husband does work so at times pt will be alone at home. Discussed OP due to pt's high level both agreeable to this. Will await therapy evaluations.  Vicki Gonzales 08/23/2020, 11:46 AM

## 2020-08-23 NOTE — Plan of Care (Signed)
Problem: RH Balance Goal: LTG Patient will maintain dynamic standing with ADLs (OT) Description: LTG:  Patient will maintain dynamic standing balance with assist during activities of daily living (OT)  Flowsheets (Taken 08/23/2020 0933) LTG: Pt will maintain dynamic standing balance during ADLs with: Independent with assistive device   Problem: Sit to Stand Goal: LTG:  Patient will perform sit to stand in prep for activites of daily living with assistance level (OT) Description: LTG:  Patient will perform sit to stand in prep for activites of daily living with assistance level (OT) Flowsheets (Taken 08/23/2020 0933) LTG: PT will perform sit to stand in prep for activites of daily living with assistance level: Independent with assistive device   Problem: RH Eating Goal: LTG Patient will perform eating w/assist, cues/equip (OT) Description: LTG: Patient will perform eating with assist, with/without cues using equipment (OT) Flowsheets (Taken 08/23/2020 0933) LTG: Pt will perform eating with assistance level of: Independent   Problem: RH Grooming Goal: LTG Patient will perform grooming w/assist,cues/equip (OT) Description: LTG: Patient will perform grooming with assist, with/without cues using equipment (OT) Flowsheets (Taken 08/23/2020 0933) LTG: Pt will perform grooming with assistance level of: Independent with assistive device    Problem: RH Bathing Goal: LTG Patient will bathe all body parts with assist levels (OT) Description: LTG: Patient will bathe all body parts with assist levels (OT) Flowsheets (Taken 08/23/2020 0933) LTG: Pt will perform bathing with assistance level/cueing: Supervision/Verbal cueing LTG: Position pt will perform bathing: Shower   Problem: RH Dressing Goal: LTG Patient will perform upper body dressing (OT) Description: LTG Patient will perform upper body dressing with assist, with/without cues (OT). Flowsheets (Taken 08/23/2020 0933) LTG: Pt will perform upper  body dressing with assistance level of: Independent Goal: LTG Patient will perform lower body dressing w/assist (OT) Description: LTG: Patient will perform lower body dressing with assist, with/without cues in positioning using equipment (OT) Flowsheets (Taken 08/23/2020 0933) LTG: Pt will perform lower body dressing with assistance level of: Independent with assistive device   Problem: RH Toileting Goal: LTG Patient will perform toileting task (3/3 steps) with assistance level (OT) Description: LTG: Patient will perform toileting task (3/3 steps) with assistance level (OT)  Flowsheets (Taken 08/23/2020 0933) LTG: Pt will perform toileting task (3/3 steps) with assistance level: Independent with assistive device   Problem: RH Functional Use of Upper Extremity Goal: LTG Patient will use RT/LT upper extremity as a (OT) Description: LTG: Patient will use right/left upper extremity as a stabilizer/gross assist/diminished/nondominant/dominant level with assist, with/without cues during functional activity (OT) Flowsheets (Taken 08/23/2020 0933) LTG: Use of upper extremity in functional activities: LUE as nondominant level LTG: Pt will use upper extremity in functional activity with assistance level of: Independent with assistive device   Problem: RH Simple Meal Prep Goal: LTG Patient will perform simple meal prep w/assist (OT) Description: LTG: Patient will perform simple meal prep with assistance, with/without cues (OT). Flowsheets (Taken 08/23/2020 0933) LTG: Pt will perform simple meal prep with assistance level of: Independent with assistive device   Problem: RH Toilet Transfers Goal: LTG Patient will perform toilet transfers w/assist (OT) Description: LTG: Patient will perform toilet transfers with assist, with/without cues using equipment (OT) Flowsheets (Taken 08/23/2020 0933) LTG: Pt will perform toilet transfers with assistance level of: Independent with assistive device   Problem: RH  Tub/Shower Transfers Goal: LTG Patient will perform tub/shower transfers w/assist (OT) Description: LTG: Patient will perform tub/shower transfers with assist, with/without cues using equipment (OT) Flowsheets (Taken 08/23/2020 0933)  LTG: Pt will perform tub/shower stall transfers with assistance level of: Supervision/Verbal cueing   Problem: RH Furniture Transfers Goal: LTG Patient will perform furniture transfers w/assist (OT/PT) Description: LTG: Patient will perform furniture transfers  with assistance (OT/PT). Flowsheets (Taken 08/23/2020 0933) LTG: Pt will perform furniture transfers with assist:: Independent with assistive device

## 2020-08-23 NOTE — Plan of Care (Signed)
  Problem: RH Balance Goal: LTG Patient will maintain dynamic standing balance (PT) Description: LTG:  Patient will maintain dynamic standing balance with assistance during mobility activities (PT) Flowsheets (Taken 08/23/2020 1615) LTG: Pt will maintain dynamic standing balance during mobility activities with:: Independent with assistive device    Problem: RH Bed Mobility Goal: LTG Patient will perform bed mobility with assist (PT) Description: LTG: Patient will perform bed mobility with assistance, with/without cues (PT). Flowsheets (Taken 08/23/2020 1615) LTG: Pt will perform bed mobility with assistance level of: Independent   Problem: RH Bed to Chair Transfers Goal: LTG Patient will perform bed/chair transfers w/assist (PT) Description: LTG: Patient will perform bed to chair transfers with assistance (PT). Flowsheets (Taken 08/23/2020 1615) LTG: Pt will perform Bed to Chair Transfers with assistance level: Independent with assistive device    Problem: RH Car Transfers Goal: LTG Patient will perform car transfers with assist (PT) Description: LTG: Patient will perform car transfers with assistance (PT). Flowsheets (Taken 08/23/2020 1615) LTG: Pt will perform car transfers with assist:: Independent with assistive device    Problem: RH Floor Transfers Goal: LTG Patient will perform floor transfers w/assist (PT) Description: LTG: Patient will perform floor transfers with assistance (PT). Flowsheets (Taken 08/23/2020 1615) LTG: PT WILL PERFORM FLOOR TRANFERS  WITH  ASSIST:: Minimal Assistance - Patient > 75%   Problem: RH Ambulation Goal: LTG Patient will ambulate in controlled environment (PT) Description: LTG: Patient will ambulate in a controlled environment, # of feet with assistance (PT). Flowsheets (Taken 08/23/2020 1615) LTG: Pt will ambulate in controlled environ  assist needed:: Independent with assistive device LTG: Ambulation distance in controlled environment: 152ft with  LRAD Goal: LTG Patient will ambulate in home environment (PT) Description: LTG: Patient will ambulate in home environment, # of feet with assistance (PT). Flowsheets (Taken 08/23/2020 1615) LTG: Pt will ambulate in home environ  assist needed:: Independent with assistive device LTG: Ambulation distance in home environment: 58ft with LRAD Goal: LTG Patient will ambulate in community environment (PT) Description: LTG: Patient will ambulate in community environment, # of feet with assistance (PT). Flowsheets (Taken 08/23/2020 1615) LTG: Pt will ambulate in community environ  assist needed:: Supervision/Verbal cueing LTG: Ambulation distance in community environment: 374ft with LRAD   Problem: RH Stairs Goal: LTG Patient will ambulate up and down stairs w/assist (PT) Description: LTG: Patient will ambulate up and down # of stairs with assistance (PT) Flowsheets (Taken 08/23/2020 1615) LTG: Pt will ambulate up/down stairs assist needed:: Supervision/Verbal cueing LTG: Pt will  ambulate up and down number of stairs: 12 steps with BUE support

## 2020-08-23 NOTE — Progress Notes (Signed)
Inpatient Rehabilitation  Patient information reviewed and entered into eRehab system by Maika Mcelveen M. Jaquavius Hudler, M.A., CCC/SLP, PPS Coordinator.  Information including medical coding, functional ability and quality indicators will be reviewed and updated through discharge.    

## 2020-08-23 NOTE — Plan of Care (Signed)
  Problem: RH Problem Solving Goal: LTG Patient will demonstrate problem solving for (SLP) Description: LTG:  Patient will demonstrate problem solving for basic/complex daily situations with cues  (SLP) Flowsheets (Taken 08/23/2020 1611) LTG: Patient will demonstrate problem solving for (SLP): Complex daily situations LTG Patient will demonstrate problem solving for: Supervision   Problem: RH Attention Goal: LTG Patient will demonstrate this level of attention during functional activites (SLP) Description: LTG:  Patient will will demonstrate this level of attention during functional activites (SLP) Flowsheets (Taken 08/23/2020 1611) Patient will demonstrate during cognitive/linguistic activities the attention type of: Selective Patient will demonstrate this level of attention during cognitive/linguistic activities in: Controlled LTG: Patient will demonstrate this level of attention during cognitive/linguistic activities with assistance of (SLP): Modified Independent   Problem: RH Awareness Goal: LTG: Patient will demonstrate awareness during functional activites type of (SLP) Description: LTG: Patient will demonstrate awareness during functional activites type of (SLP) Flowsheets (Taken 08/23/2020 1611) Patient will demonstrate during cognitive/linguistic activities awareness type of: Emergent LTG: Patient will demonstrate awareness during cognitive/linguistic activities with assistance of (SLP): Modified Independent

## 2020-08-23 NOTE — Progress Notes (Signed)
[facility]patient [facility] Individual Statement of Services  Patient Name:  Vicki Gonzales  Date:  08/23/2020  Welcome to the [facility]patient [facility].  Our goal is to provide you with an individualized program based on your diagnosis and situation, designed to meet your specific needs.  With this comprehensive rehabilitation program, you will be expected to participate in at least 3 hours of rehabilitation therapies Monday-Friday, with modified therapy programming on the weekends.  Your rehabilitation program will include the following services:  Physical Therapy (PT), Occupational Therapy (OT), 24 hour per day rehabilitation nursing, Therapeutic Recreaction (TR), Neuropsychology, Care Coordinator, Rehabilitation Medicine, Nutrition Services, and Pharmacy Services  Weekly team conferences will be held on wednesday to discuss your progress.  Your Inpatient Rehabilitation Care Coordinator will talk with you frequently to get your input and to update you on team discussions.  Team conferences with you and your family in attendance may also be held.  Expected length of stay: 7-10 days  Overall anticipated outcome: independent with device  Depending on your progress and recovery, your program may change. Your Inpatient Rehabilitation Care Coordinator will coordinate services and will keep you informed of any changes. Your Inpatient Rehabilitation Care Coordinator's name and contact numbers are listed  below.  The following services may also be recommended but are not provided by the Inpatient Rehabilitation Center:  Driving Evaluations Home Health Rehabiltiation Services Outpatient Rehabilitation Services   Arrangements will be made to provide these services after discharge if needed.  Arrangements include referral to agencies that provide these services.  Your insurance has been verified to be:  BCBS Your primary doctor is:  Careers information officer  Pertinent information will be  shared with your doctor and your insurance company.  Inpatient Rehabilitation Care Coordinator:  Lavera Guise, [place] [phone] or [phone]  Information discussed with and copy given to patient by: [name], 08/23/2020, 11:48 AM

## 2020-08-23 NOTE — Evaluation (Signed)
Occupational Therapy Assessment and Plan  Patient Details  Name: Vicki Gonzales MRN: [id number] Date of Birth: Oct 01, 1965  OT Diagnosis: cognitive deficits, hemiplegia affecting non-dominant side, muscle weakness (generalized), and decreased postural control with dynamic tasks, decreased ac tivity tolerance, mild L inattention Rehab Potential: Rehab Potential (ACUTE ONLY): Good ELOS: 7 to 10 days   Today's Date: 08/23/2020 OT Individual Time: 9242-6834 OT Individual Time Calculation (min): 57 min     Hospital Problem: Principal Problem:   Right middle cerebral artery stroke ([place])   Past Medical History:  Past Medical History:  Diagnosis Date   ADHD    Anxiety    Carpal tunnel syndrome on right    Depression    HTN (hypertension)    Hypothyroidism    Migraines    Spondylosis    Past Surgical History:  Past Surgical History:  Procedure Laterality Date   ANTERIOR CRUCIATE LIGAMENT REPAIR Left 2021   APPENDECTOMY     BUBBLE STUDY  08/17/2020   Procedure: BUBBLE STUDY;  Surgeon: Buford Dresser, MD;  Location: [facility] ENDOSCOPY;  Service: Cardiovascular;;   FALSE ANEURYSM REPAIR Right 08/15/2020   Procedure: REPAIR RIGHT FEMORAL PSEUDOANEURYSM;  Surgeon: Serafina Mitchell, MD;  Location: [facility] OR;  Service: Vascular;  Laterality: Right;   IR ANGIO INTRA EXTRACRAN SEL COM CAROTID INNOMINATE UNI L MOD SED  08/14/2020   IR ANGIO INTRA EXTRACRAN SEL INTERNAL CAROTID UNI R MOD SED  08/14/2020   IR ANGIO VERTEBRAL SEL SUBCLAVIAN INNOMINATE BILAT MOD SED  08/14/2020   IR CT HEAD LTD  08/14/2020   IR CT HEAD LTD  08/14/2020   IR INTRA CRAN STENT  08/14/2020   IR INTRAVSC STENT CERV CAROTID W/O EMB-PROT MOD SED INC ANGIO  08/14/2020   RADIOLOGY WITH ANESTHESIA N/A 08/14/2020   Procedure: IR WITH ANESTHESIA;  Surgeon: Luanne Bras, MD;  Location: [place];  Service: Radiology;  Laterality: N/A;   TEE WITHOUT CARDIOVERSION N/A 08/17/2020   Procedure: TRANSESOPHAGEAL ECHOCARDIOGRAM (TEE);   Surgeon: Buford Dresser, MD;  Location: [facility] ENDOSCOPY;  Service: Cardiovascular;  Laterality: N/A;   TUBAL LIGATION      Assessment & Plan Clinical Impression: Patient is a 55 y.o. year old female with  history of hypertension hypothyroidism, migraine headaches and tobacco use.  History taken from chart review and patient.  Patient lives with spouse independent prior to admission.  Two-level home with one-step to entry.  Good family support noted.  She presented on 08/12/2020 to [facility] with dysarthria and left hemiparesis.  Cranial CT scan unremarkable for acute intracranial process.  CT angiogram head and neck proximal right ICA occlusion with intracranial reconstitution.  Findings suspicious for subocclusive embolus versus severe stenosis at the right MCA bifurcation.  Occlusion of the left vertebral artery its origin with distal reconstitution.  Patient did receive tPA.  Echocardiogram with ejection fraction of 60-65%, no wall motion abnormalities grade 1 diastolic dysfunction.  MRI and MRA showed multifocal areas of right MCA territory infarct including a new area in the right perirolandic region.  Few small foci of susceptibility artifact within the areas of restricted diffusion.  Stable trace right subarachnoid hemorrhage.  Patient was transferred to [facility] underwent four-vessel cerebral arteriogram with revascularization of occluded right ICA on 08/14/2020 per interventional radiology.  Hospital course further complicated by post arteriogram right groin pain findings of right femoral pseudoaneurysm underwent repair of right femoral pseudoaneurysm 08/15/2020.  Hospital course TEE showed normal ejection fraction possible PFO TCD bubble study showed  [name] degree 2 with Valsalva.  Hyper coag labs thus far remain negative.  Recommendations 30-day cardiac event monitor.  Patient was cleared to begin aspirin 81 mg daily and Brilinta for CVA prophylaxis.  Maintained on Lovenox for DVT prophylaxis.   Tolerating a regular consistency diet.  Therapy evaluations completed due to patient's left-sided hemiparesis was admitted for a comprehensive rehab program.  Please see preadmission assessment from earlier today as well.Patient transferred to CIR on 08/22/2020 .    Patient currently requires  CGA  with basic self-care skills secondary to muscle weakness, decreased cardiorespiratoy endurance, impaired timing and sequencing, decreased coordination, and decreased motor planning, decreased attention to left, decreased problem solving, decreased safety awareness, and decreased memory, and decreased standing balance, decreased postural control, and hemiplegia.  Prior to hospitalization, patient could complete BADL/IADL with independent .  Patient will benefit from skilled intervention to increase independence with basic self-care skills and increase level of independence with iADL prior to discharge home with care partner.  Anticipate patient will require intermittent supervision and follow up outpatient.  OT - End of Session Activity Tolerance: Tolerates 30+ min activity with multiple rests Endurance Deficit: Yes Endurance Deficit Description: endorses fatigue post ADL OT Assessment Rehab Potential (ACUTE ONLY): Good OT Barriers to Discharge: Decreased caregiver support OT Patient demonstrates impairments in the following area(s): Balance;Cognition;Endurance;Perception;Safety;Skin Integrity;Motor;Vision OT Basic ADL's Functional Problem(s): Eating;Grooming;Bathing;Dressing;Toileting OT Advanced ADL's Functional Problem(s): Simple Meal Preparation OT Transfers Functional Problem(s): Toilet;Tub/Shower OT Additional Impairment(s): Fuctional Use of Upper Extremity OT Plan OT Intensity: Minimum of 1-2 x/day, 45 to 90 minutes OT Frequency: 5 out of 7 days OT Duration/Estimated Length of Stay: 7 to 10 days OT Treatment/Interventions: Balance/vestibular training;Community reintegration;Disease  mangement/prevention;Neuromuscular re-education;Patient/family education;Self Care/advanced ADL retraining;Therapeutic Exercise;UE/LE Coordination activities;Wheelchair propulsion/positioning;Visual/perceptual remediation/compensation;Cognitive remediation/compensation;Discharge planning;DME/adaptive equipment instruction;UE/LE Strength taining/ROM;Therapeutic Activities;Skin care/wound managment;Psychosocial support;Pain management;Functional mobility training OT Self Feeding Anticipated Outcome(s): ind OT Basic Self-Care Anticipated Outcome(s): mod I OT Toileting Anticipated Outcome(s): mod I OT Bathroom Transfers Anticipated Outcome(s): mod I and S OT Recommendation Recommendations for Other Services: Therapeutic Recreation consult Patient destination: Home Follow Up Recommendations: Outpatient OT Equipment Recommended: To be determined   OT Evaluation Precautions/Restrictions  Precautions Precautions: Fall Precaution Comments: L inattention Restrictions Weight Bearing Restrictions: No General Chart Reviewed: Yes Family/Caregiver Present: Yes (husband)  Pain Pain Assessment Pain Scale: Faces Pain Score: 0-No pain Home Living/Prior Functioning Home Living Family/patient expects to be discharged to:: Private residence Living Arrangements: Spouse/significant other Available Help at Discharge: Family, Available 24 hours/day Type of Home: House Home Access: Stairs to enter [employer] of Steps: 1 Home Layout: Two level, 1/2 bath on main level, Bed/bath upstairs Alternate Level Stairs-Number of Steps: 13 Alternate Level Stairs-Rails: Right, Left Bathroom Shower/Tub: Chiropodist: Standard Bathroom Accessibility: Yes Additional Comments: 1/2 bath main level  Lives With: Family, Significant other, Son IADL History Homemaking Responsibilities: Yes Meal Prep Responsibility: Primary Laundry Responsibility: Primary Cleaning Responsibility:  Primary Shopping Responsibility: Primary Child Care Responsibility: No Current License: Yes Mode of Transportation: Car Occupation: Unemployed Type of Occupation: previously working in [place], hopes to find remote work in future Leisure and Hobbies: walking 5 miles daily with husband Prior Function Level of Independence: Independent with basic ADLs, Independent with homemaking with ambulation, Independent with gait  Able to Take Stairs?: Yes Driving: Yes Vocation: Retired Comments: plans to work from home following recovery Vision Baseline Vision/History: No visual deficits Patient Visual Report: No change from baseline;Other (comment) (endorses R eye "sparkles" at time  of stroke, but has since ceased) Vision Assessment?: Yes Eye Alignment: Within Functional Limits Ocular Range of Motion: Within Functional Limits Alignment/Gaze Preference: Within Defined Limits Tracking/Visual Pursuits: Decreased smoothness of eye movement to LEFT superior field;Decreased smoothness of eye movement to LEFT inferior field Saccades: Additional eye shifts occurred during testing;Decreased speed of saccadic movement Convergence: Within functional limits Visual Fields: Left visual field deficit Perception  Perception: Impaired Inattention/Neglect: Does not attend to left visual field Praxis Praxis: Intact Cognition Overall Cognitive Status: Impaired/Different from baseline Arousal/Alertness: Awake/alert Orientation Level: Person;Place;Situation Person: Oriented Place: Oriented Situation: Oriented Year: 2022 Month: August Day of Week: Correct Memory: Appears intact Immediate Memory Recall: Sock;Blue;Bed Memory Recall Sock: Without Cue Memory Recall Blue: Without Cue Memory Recall Bed: Without Cue Safety/Judgment: Impaired Comments: Pt able to verbalize safety precautions/energy conservation techniques, cont to req cuing to enact into ADL. Sensation Sensation Light Touch:  Impaired Detail Light Touch Impaired Details: Impaired LLE Hot/Cold: Appears Intact Proprioception: Impaired Detail Proprioception Impaired Details: Impaired LLE;Impaired LUE Stereognosis: Appears Intact Coordination Gross Motor Movements are Fluid and Coordinated: No Fine Motor Movements are Fluid and Coordinated: No Coordination and Movement Description: mild ataxia on the L Finger Nose Finger Test: mild ataxia on the L Motor  Motor Motor: Hemiplegia;Ataxia Motor - Skilled Clinical Observations: L sided hemiplegia  Trunk/Postural Assessment  Cervical Assessment Cervical Assessment: Within Functional Limits Thoracic Assessment Thoracic Assessment: Exceptions to [facility] (mildly rounded shoulders) Lumbar Assessment Lumbar Assessment: Exceptions to [facility] (posterior pelvic tilt in sitting) Postural Control Postural Control: Deficits on evaluation (decreased with dynamic standing tasks)  Balance Balance Balance Assessed: Yes Static Sitting Balance Static Sitting - Level of Assistance: 7: Independent Dynamic Sitting Balance Dynamic Sitting - Balance Support: Feet supported;During functional activity Dynamic Sitting - Level of Assistance: 5: Stand by assistance Dynamic Sitting Balance - Compensations: S Static Standing Balance Static Standing - Balance Support: During functional activity;Bilateral upper extremity supported Static Standing - Level of Assistance: 5: Stand by assistance Static Standing - Comment/# of Minutes: S Dynamic Standing Balance Dynamic Standing - Balance Support: No upper extremity supported;During functional activity Dynamic Standing - Level of Assistance: 4: Min assist Extremity/Trunk Assessment RUE Assessment RUE Assessment: Within Functional Limits LUE Assessment LUE Assessment: Exceptions to [facility] Active Range of Motion (AROM) Comments: 3/4 to full shoulder flexion General Strength Comments: 4-/5 in shoulder flexion, mild ataxia  Care Tool Care Tool Self  Care Eating   Eating Assist Level: Set up assist    Oral Care    Oral Care Assist Level: Supervision/Verbal cueing (standing)    Bathing   Body parts bathed by patient: Face;Left lower leg;Right arm;Left arm;Chest;Abdomen;Front perineal area;Buttocks;Right upper leg;Right lower leg;Left upper leg     Assist Level: Supervision/Verbal cueing (seated)    Upper Body Dressing(including orthotics)   What is the patient wearing?: Bra;Pull over shirt   Assist Level: Minimal Assistance - Patient > 75%    Lower Body Dressing (excluding footwear)   What is the patient wearing?: Underwear/pull up;Pants Assist for lower body dressing: Contact Guard/Touching assist    Putting on/Taking off footwear   What is the patient wearing?: Non-skid slipper socks Assist for footwear: Total Assistance - Patient < 25%       Care Tool Toileting Toileting activity   Assist for toileting: Contact Guard/Touching assist     Care Tool Bed Mobility Roll left and right activity   Roll left and right assist level: Independent    Sit to lying activity   Sit to lying  assist level: Independent    Lying to sitting edge of bed activity   Lying to sitting edge of bed assist level: Independent     Care Tool Transfers Sit to stand transfer   Sit to stand assist level: Supervision/Verbal cueing    Chair/bed transfer   Chair/bed transfer assist level: Contact Guard/Touching assist     Toilet transfer   Assist Level: Contact Guard/Touching assist     Care Tool Cognition Expression of Ideas and Wants Expression of Ideas and Wants: Without difficulty (complex and basic) - expresses complex messages without difficulty and with speech that is clear and easy to understand   Understanding Verbal and Non-Verbal Content Understanding Verbal and Non-Verbal Content: Usually understands - understands most conversations, but misses some part/intent of message. Requires cues at times to understand   Memory/Recall  Ability *first 3 days only Memory/Recall Ability *first 3 days only: Current season;Staff names and faces;That he or she is in a hospital/hospital unit    Refer to Care Plan for [place] [facility] OT Short Term Goal 1 (Week 1): STG = LTG 2/2 ELOS  Recommendations for other services: Therapeutic Recreation  Pet therapy, Kitchen group, Stress management, and Outing/community reintegration   Skilled Therapeutic Intervention ADL ADL Eating: Set up Where Assessed-Eating: Wheelchair Grooming: Supervision/safety Where Assessed-Grooming: Standing at sink;Sitting at sink Upper Body Bathing: Supervision/safety Where Assessed-Upper Body Bathing: Shower Lower Body Bathing: Contact guard Where Assessed-Lower Body Bathing: Shower Upper Body Dressing: Supervision/safety Where Assessed-Upper Body Dressing: Sitting at sink Lower Body Dressing: Contact guard Where Assessed-Lower Body Dressing: Standing at sink Toileting: Contact guard Where Assessed-Toileting: Glass blower/designer: Therapist, music Method: Counselling psychologist: Raised toilet seat;Grab bars Tub/Shower Transfer: Metallurgist Method: Optometrist: Facilities manager: Not assessed Mobility  Bed Mobility Bed Mobility: Supine to Sit Supine to Sit: Independent with assistive device Transfers Sit to Stand: Contact Guard/Touching assist Stand to Sit: Contact Guard/Touching assist  Session Note: Pt received semi-reclined in bed with spouse Richardson Landry present, no c/o pain and agreeable to OT eval. Requesting to shower. Reviewed role of CIR OT, evaluation process, ADL/func mobility retraining, goals for therapy, and safety plan. Evaluation completed as documented above with session focus on energy conservation education, bathing at shower level, room level func mobility, and dressing. Came to sitting EOB with mod I. STS and amb  to bathroom with RW + CGA. Stepped over shower lip with use of grab bar with min Vcs for sequencing/safety. Sat on 3in1 for seated shower and bathed full-body with close S. Amb back out to w/c with min Vcs for sequencing exit of shower. Completed UBD with min A to thread RUE in bra and increased time, LBD with CGA and min Vcs for hand split RW technique. Reviewed energy conservation, falls prevention and, CVA risk factors, will cont to benefit from further education. Pt very motivated to well in CIR with goals of safety and ind prior to DC. Pt left seated in w/c with chair alarm engaged, spouse present, call bell in reach and all immediate needs met.   Discharge Criteria: Patient will be discharged from OT if patient refuses treatment 3 consecutive times without medical reason, if treatment goals not met, if there is a change in medical status, if patient makes no progress towards goals or if patient is discharged from hospital.  The above assessment, treatment plan, treatment alternatives and goals were discussed and mutually agreed  upon: by patient and by family  Volanda Napoleon MS, OTR/L  08/23/2020, 9:29 AM

## 2020-08-23 NOTE — Evaluation (Signed)
Physical Therapy Assessment and Plan  Patient Details  Name: Vicki Gonzales MRN: [id number] Date of Birth: 31-Mar-1965  PT Diagnosis: Abnormal posture, Abnormality of gait, Ataxia, Ataxic gait, Hemiplegia non-dominant, Impaired cognition, Impaired sensation, and Muscle weakness Rehab Potential: Good ELOS: 6-9 days   Today's Date: 08/23/2020 PT Individual Time:1015-1115    60 min   Hospital Problem: Principal Problem:   Right middle cerebral artery stroke [facility])   Past Medical History:  Past Medical History:  Diagnosis Date   ADHD    Anxiety    Carpal tunnel syndrome on right    Depression    HTN (hypertension)    Hypothyroidism    Migraines    Spondylosis    Past Surgical History:  Past Surgical History:  Procedure Laterality Date   ANTERIOR CRUCIATE LIGAMENT REPAIR Left 2021   APPENDECTOMY     BUBBLE STUDY  08/17/2020   Procedure: BUBBLE STUDY;  Surgeon: Buford Dresser, MD;  Location: [place];  Service: Cardiovascular;;   FALSE ANEURYSM REPAIR Right 08/15/2020   Procedure: REPAIR RIGHT FEMORAL PSEUDOANEURYSM;  Surgeon: Serafina Mitchell, MD;  Location: [facility] OR;  Service: Vascular;  Laterality: Right;   IR ANGIO INTRA EXTRACRAN SEL COM CAROTID INNOMINATE UNI L MOD SED  08/14/2020   IR ANGIO INTRA EXTRACRAN SEL INTERNAL CAROTID UNI R MOD SED  08/14/2020   IR ANGIO VERTEBRAL SEL SUBCLAVIAN INNOMINATE BILAT MOD SED  08/14/2020   IR CT HEAD LTD  08/14/2020   IR CT HEAD LTD  08/14/2020   IR INTRA CRAN STENT  08/14/2020   IR INTRAVSC STENT CERV CAROTID W/O EMB-PROT MOD SED INC ANGIO  08/14/2020   RADIOLOGY WITH ANESTHESIA N/A 08/14/2020   Procedure: IR WITH ANESTHESIA;  Surgeon: Luanne Bras, MD;  Location: [place];  Service: Radiology;  Laterality: N/A;   TEE WITHOUT CARDIOVERSION N/A 08/17/2020   Procedure: TRANSESOPHAGEAL ECHOCARDIOGRAM (TEE);  Surgeon: Buford Dresser, MD;  Location: [facility] ENDOSCOPY;  Service: Cardiovascular;  Laterality: N/A;   TUBAL LIGATION       Assessment & Plan Clinical Impression: Patient is a 55 year old right-handed female with history of hypertension hypothyroidism, migraine headaches and tobacco use.  History taken from chart review and patient.  Patient lives with spouse independent prior to admission.  Two-level home with one-step to entry.  Good family support noted.  She presented on 08/12/2020 to [facility] with dysarthria and left hemiparesis.  Cranial CT scan unremarkable for acute intracranial process.  CT angiogram head and neck proximal right ICA occlusion with intracranial reconstitution.  Findings suspicious for subocclusive embolus versus severe stenosis at the right MCA bifurcation.  Occlusion of the left vertebral artery its origin with distal reconstitution.  Patient did receive tPA.  Echocardiogram with ejection fraction of 60-65%, no wall motion abnormalities grade 1 diastolic dysfunction.  MRI and MRA showed multifocal areas of right MCA territory infarct including a new area in the right perirolandic region.  Few small foci of susceptibility artifact within the areas of restricted diffusion.  Stable trace right subarachnoid hemorrhage.  Patient was transferred to [facility] underwent four-vessel cerebral arteriogram with revascularization of occluded right ICA on 08/14/2020 per interventional radiology.  Hospital course further complicated by post arteriogram right groin pain findings of right femoral pseudoaneurysm underwent repair of right femoral pseudoaneurysm 08/15/2020.  Hospital course TEE showed normal ejection fraction possible PFO TCD bubble study showed [name] degree 2 with Valsalva.  Hyper coag labs thus far remain negative.  Recommendations 30-day cardiac event monitor.  Patient was cleared to begin aspirin 81 mg daily and Brilinta for CVA prophylaxis.  Maintained on Lovenox for DVT prophylaxis.  Tolerating a regular consistency diet.  Therapy evaluations completed due to patient's left-sided hemiparesis was  admitted for a comprehensive rehab program. Patient transferred to CIR on 08/22/2020 .   Patient currently requires min with mobility secondary to muscle weakness and muscle joint tightness, decreased cardiorespiratoy endurance, ataxia and decreased coordination, field cut, decreased attention to left, decreased attention, decreased awareness, decreased problem solving, decreased safety awareness, and delayed processing, and decreased sitting balance, decreased standing balance, decreased postural control, hemiplegia, decreased balance strategies, and difficulty maintaining precautions.  Prior to hospitalization, patient was independent  with mobility and lived with Family, Significant other, Son in a House home.  Home access is 1Stairs to enter.  Patient will benefit from skilled PT intervention to maximize safe functional mobility, minimize fall risk, and decrease caregiver burden for planned discharge home with intermittent assist.  Anticipate patient will benefit from follow up OP at discharge.  PT - End of Session Activity Tolerance: Tolerates 10 - 20 min activity with multiple rests Endurance Deficit: Yes PT Assessment Rehab Potential (ACUTE/IP ONLY): Good PT Barriers to Discharge: [place] home environment;Decreased caregiver support;Home environment access/layout;Insurance for SNF coverage;Behavior PT Patient demonstrates impairments in the following area(s): Sensory;Balance;Behavior;Endurance;Motor;Nutrition;Pain;Safety;Perception PT Transfers Functional Problem(s): Bed Mobility;Bed to Chair;Car;Furniture;Floor PT Locomotion Functional Problem(s): Ambulation;Wheelchair Mobility;Stairs PT Plan PT Intensity: Minimum of 1-2 x/day ,45 to 90 minutes PT Frequency: 5 out of 7 days PT Duration Estimated Length of Stay: 6-9 days PT Treatment/Interventions: Ambulation/gait training;Community reintegration;DME/adaptive equipment instruction;Neuromuscular re-education;Psychosocial support;Stair  training;UE/LE Strength taining/ROM;Wheelchair propulsion/positioning;Balance/vestibular training;Discharge planning;Functional electrical stimulation;Pain management;Skin care/wound management;Therapeutic Activities;UE/LE Coordination activities;Cognitive remediation/compensation;Disease management/prevention;Functional mobility training;Splinting/orthotics;Patient/family education;Therapeutic Exercise;Visual/perceptual remediation/compensation PT Transfers Anticipated Outcome(s): Mod I with LRAD PT Locomotion Anticipated Outcome(s): Mod I - supervision assist with LRAD; ambulatory PT Recommendation Recommendations for Other Services: Therapeutic Recreation consult;Neuropsych consult Therapeutic Recreation Interventions: Outing/community reintergration;Stress management Follow Up Recommendations: Home health PT Patient destination: Home Equipment Recommended: To be determined;Rolling walker with 5" wheels Equipment Details: rollator   PT Evaluation Precautions/Restrictions Precautions Precautions: Fall Precaution Comments: L inattention Restrictions Weight Bearing Restrictions: No General   Vital Signs Pain Pain Assessment Pain Scale: 0-10 Pain Score: 0-No pain Home Living/Prior Functioning Home Living Living Arrangements: Spouse/significant other Available Help at Discharge: Family;Available 24 hours/day Type of Home: House Home Access: Stairs to enter [employer] of Steps: 1 Home Layout: Two level;1/2 bath on main level;Bed/bath upstairs Alternate Level Stairs-Number of Steps: 13 Alternate Level Stairs-Rails: Right;Left Bathroom Shower/Tub: Tub/shower unit Bathroom Accessibility: Yes Additional Comments: 1/2 bath main level  Lives With: Family;Significant other;Son Prior Function Level of Independence: Independent with basic ADLs;Independent with homemaking with ambulation;Independent with gait  Able to Take Stairs?: Yes Driving: Yes Vocation:  Retired Comments: plans to work from home following recovery Vision/Perception  Vision - Assessment Eye Alignment: Within Advertising copywriter Tracking/Visual Pursuits: Decreased smoothness of eye movement to LEFT superior field;Decreased smoothness of eye movement to LEFT inferior field Saccades: Additional eye shifts occurred during testing;Decreased speed of saccadic movement Convergence: Within functional limits Perception Perception: Impaired Inattention/Neglect: Does not attend to left visual field (mild attention) Praxis Praxis: Intact   Sensation Sensation Light Touch: Impaired Detail Light Touch Impaired Details: Impaired LLE Proprioception: Impaired Detail Proprioception Impaired Details: Impaired LLE;Impaired LUE Additional Comments: positive thumb find on the L Coordination Gross Motor Movements are Fluid and Coordinated: No Fine Motor Movements are Fluid and Coordinated: No Coordination and Movement  Description: mild ataxia on the L Finger Nose Finger Test: mild ataxia on the L Heel Shin Test: mild ataxia on the L Motor  Motor Motor: Hemiplegia;Ataxia Motor - Skilled Clinical Observations: L sided hemiplegia   Trunk/Postural Assessment  Cervical Assessment Cervical Assessment: Within Functional Limits Thoracic Assessment Thoracic Assessment: Exceptions to [facility] (mildly rounded shoulders) Lumbar Assessment Lumbar Assessment: Exceptions to [facility] (posterior pelvic tilt in sitting) Postural Control Postural Control: Deficits on evaluation (decreased with dynamic standing tasks)  Balance Balance Balance Assessed: Yes Standardized Balance Assessment Standardized Balance Assessment: Berg Balance Test Berg Balance Test Sit to Stand: Able to stand  independently using hands Standing Unsupported: Able to stand safely 2 minutes Sitting with Back Unsupported but Feet Supported on Floor or Stool: Able to sit safely and securely 2 minutes Stand to Sit: Controls descent by  using hands Transfers: Able to transfer safely, definite need of hands Standing Unsupported with Eyes Closed: Able to stand 10 seconds with supervision Standing Ubsupported with Feet Together: Able to place feet together independently and stand for 1 minute with supervision From Standing, Reach Forward with Outstretched Arm: Can reach confidently >25 cm (10") From Standing Position, Pick up Object from Floor: Able to pick up shoe, needs supervision From Standing Position, Turn to Look Behind Over each Shoulder: Looks behind from both sides and weight shifts well Turn 360 Degrees: Able to turn 360 degrees safely but slowly Standing Unsupported, Alternately Place Feet on Step/Stool: Able to complete >2 steps/needs minimal assist Standing Unsupported, One Foot in Front: Needs help to step but can hold 15 seconds Standing on One Leg: Able to lift leg independently and hold 5-10 seconds Total Score: 41 Static Sitting Balance Static Sitting - Level of Assistance: 7: Independent Dynamic Sitting Balance Dynamic Sitting - Level of Assistance: 5: Stand by assistance Static Standing Balance Static Standing - Level of Assistance: 5: Stand by assistance Dynamic Standing Balance Dynamic Standing - Balance Support: No upper extremity supported;During functional activity Dynamic Standing - Level of Assistance: 4: Min assist Dynamic Standing - Balance Activities: Forward lean/weight shifting Extremity Assessment      RLE Assessment RLE Assessment: Within Functional Limits LLE Assessment LLE Assessment: Within Functional Limits General Strength Comments: grossly 5/5 except hip flexion 4/5  Care Tool Care Tool Bed Mobility Roll left and right activity   Roll left and right assist level: Independent with assistive device    Sit to lying activity   Sit to lying assist level: Independent with assistive device    Lying to sitting edge of bed activity   Lying to sitting edge of bed assist level:  Independent with assistive device     Care Tool Transfers Sit to stand transfer   Sit to stand assist level: Minimal Assistance - Patient > 75%    Chair/bed transfer   Chair/bed transfer assist level: Minimal Assistance - Patient > 75%     Toilet transfer   Assist Level: Contact Guard/Touching assist    Car transfer   Car transfer assist level: Minimal Assistance - Patient > 75%      Care Tool Locomotion Ambulation   Assist level: Minimal Assistance - Patient > 75%   Max distance: 150  Walk 10 feet activity   Assist level: Minimal Assistance - Patient > 75%     Walk 50 feet with 2 turns activity   Assist level: Minimal Assistance - Patient > 75%    Walk 150 feet activity   Assist level: Minimal Assistance - Patient >  75%    Walk 10 feet on uneven surfaces activity   Assist level: Minimal Assistance - Patient > 75%    Stairs   Assist level: Moderate Assistance - Patient - 50 - 74% Stairs assistive device: No device Max number of stairs: 4  Walk up/down 1 step activity   Walk up/down 1 step (curb) assist level: Moderate Assistance - Patient - 50 - 74% Walk up/down 1 step or curb assistive device: No device    Walk up/down 4 steps activity Walk up/down 4 steps assist level: Moderate Assistance - Patient - 50 - 74%    Walk up/down 12 steps activity Walk up/down 12 steps activity did not occur: Safety/medical concerns      Pick up small objects from floor   Pick up small object from the floor assist level: Minimal Assistance - Patient > 75%    Wheelchair Will patient use wheelchair at discharge?: No Type of Wheelchair: Manual   Wheelchair assist level: Minimal Assistance - Patient > 75% Max wheelchair distance: 150  Wheel 50 feet with 2 turns activity   Assist Level: Minimal Assistance - Patient > 75%  Wheel 150 feet activity   Assist Level: Minimal Assistance - Patient > 75%    Refer to Care Plan for [place] 1   STG=LTG     Recommendations for other services: Neuropsych and Therapeutic Recreation  Stress management and Outing/community reintegration  Skilled Therapeutic Intervention  Pt received sitting in WC and agreeable to PT. PT instructed patient in PT Evaluation and initiated treatment intervention; see above for results. PT educated patient in [place], rehab potential, rehab goals, and discharge recommendations along with recommendation for follow-up rehabilitation services. Patient demonstrates increased fall risk as noted by score of   41/56 on Berg Balance Scale.  (<36= high risk for falls, close to 100%; 37-45 significant >80%; 46-51 moderate >50%; 52-55 lower >25%). Car transfer with min assist from PT for safety. Gait training without AD as listed in care tool and as listed below. Patient returned to room and left sitting in [facility] with call bell in reach and all needs met.       Mobility Bed Mobility Bed Mobility: Supine to Sit Supine to Sit: Independent with assistive device Transfers Transfers: Sit to Stand;Stand Pivot Transfers;Stand to Sit Sit to Stand: Contact Guard/Touching assist Stand to Sit: Contact Guard/Touching assist Stand Pivot Transfers: Contact Guard/Touching assist Transfer (Assistive device): Rolling walker Locomotion  Gait Ambulation: Yes Gait Assistance: Minimal Assistance - Patient > 75% Gait Distance (Feet): 150 Feet Assistive device: None Gait Gait: Yes Gait Pattern: Ataxic;Narrow base of support Stairs / Additional Locomotion Stairs: Yes Stairs Assistance: Minimal Assistance - Patient > 75% Stair Management Technique: Two rails Number of Stairs: 12 Height of Stairs: 6 Wheelchair Mobility Wheelchair Mobility: Yes Wheelchair Assistance: Minimal assistance - Patient >75% Wheelchair Propulsion: Both upper extremities Wheelchair Parts Management: Needs assistance Distance: 150   Discharge Criteria: Patient will be discharged from PT if patient refuses treatment 3  consecutive times without medical reason, if treatment goals not met, if there is a change in medical status, if patient makes no progress towards goals or if patient is discharged from hospital.  The above assessment, treatment plan, treatment alternatives and goals were discussed and mutually agreed upon: by patient and by family  Lorie Phenix 08/23/2020, 1:10 PM

## 2020-08-23 NOTE — Progress Notes (Signed)
PROGRESS NOTE   Subjective/Complaints:  Had OT this am , now with SLP.  Husband at bedside with lumbar orthosis  ROS- neg CP, SOB, N/V/D  Objective:   No results found. Recent Labs    08/22/20 1618 08/23/20 0600  WBC 12.6* 12.3*  HGB 10.8* 10.9*  HCT 33.2* 32.2*  PLT 440* 438*   Recent Labs    08/22/20 1618 08/23/20 0600  NA  --  139  K  --  3.7  CL  --  105  CO2  --  25  GLUCOSE  --  115*  BUN  --  11  CREATININE 0.74 0.57  CALCIUM  --  9.0    Intake/Output Summary (Last 24 hours) at 08/23/2020 0931 Last data filed at 08/23/2020 0732 Gross per 24 hour  Intake 720 ml  Output --  Net 720 ml        Physical Exam: Vital Signs Blood pressure (!) 160/70, pulse 64, temperature 98.3 F (36.8 C), temperature source Oral, resp. rate 18, height 5\' 6"  (1.676 m), weight 83.1 kg, last menstrual period 04/07/2019, SpO2 95 %.  General: No acute distress Mood and affect are appropriate Heart: Regular rate and rhythm no rubs murmurs or extra sounds Lungs: Clear to auscultation, breathing unlabored, no rales or wheezes Abdomen: Positive bowel sounds, soft nontender to palpation, nondistended Extremities: No clubbing, cyanosis, or edema Skin: No evidence of breakdown, no evidence of rash Neurologic: Cranial nerves II through XII intact, motor strength is 5/5 in bilateral deltoid, bicep, tricep, grip, hip flexor, knee extensors, ankle dorsiflexor and plantar flexor Sensory exam normal sensation to light touch and proprioception in bilateral upper and lower extremities Cerebellar exam normal finger to nose to finger as well as heel to shin in bilateral upper and lower extremities Musculoskeletal: Full range of motion in all 4 extremities. No joint swelling    Assessment/Plan: 1. Functional deficits which require 3+ hours per day of interdisciplinary therapy in a comprehensive inpatient rehab setting. Physiatrist is  providing close team supervision and 24 hour management of active medical problems listed below. Physiatrist and rehab team continue to assess barriers to discharge/monitor patient progress toward functional and medical goals  Care Tool:  Bathing    Body parts bathed by patient: Face, Left lower leg, Right arm, Left arm, Chest, Abdomen, Front perineal area, Buttocks, Right upper leg, Right lower leg, Left upper leg         Bathing assist Assist Level: Supervision/Verbal cueing (seated)     Upper Body Dressing/Undressing Upper body dressing   What is the patient wearing?: Bra, Pull over shirt    Upper body assist Assist Level: Minimal Assistance - Patient > 75%    Lower Body Dressing/Undressing Lower body dressing      What is the patient wearing?: Underwear/pull up, Pants     Lower body assist Assist for lower body dressing: Contact Guard/Touching assist     Toileting Toileting    Toileting assist Assist for toileting: Contact Guard/Touching assist     Transfers Chair/bed transfer  Transfers assist     Chair/bed transfer assist level: Contact Guard/Touching assist     Locomotion Ambulation   Ambulation  assist              Walk 10 feet activity   Assist           Walk 50 feet activity   Assist           Walk 150 feet activity   Assist           Walk 10 feet on uneven surface  activity   Assist           Wheelchair     Assist               Wheelchair 50 feet with 2 turns activity    Assist            Wheelchair 150 feet activity     Assist          Blood pressure (!) 160/70, pulse 64, temperature 98.3 F (36.8 C), temperature source Oral, resp. rate 18, height 5\' 6"  (1.676 m), weight 83.1 kg, last menstrual period 04/07/2019, SpO2 95 %.  Medical Problem List and Plan: 1.  Left-sided weakness secondary to right MCA infarct due to right ICA occlusion status post tPA complicated by small  SAH with later right ICA stenting complicated by right femoral artery pseudoaneurysm with repair 08/15/2020 per vascular surgery             -patient may shower             -ELOS/Goals: 4 to 6 days/revision/mod I             Admit to CIR 2.  Antithrombotics: -DVT/anticoagulation: Lovenox Mechanical: Sequential compression devices, entire leg Bilateral lower extremities Pharmaceutical: Lovenox             -antiplatelet therapy: Aspirin 81 mg daily and Brilinta 90 mg twice daily 3. Pain Management: Tramadol as needed 4. Mood: Paxil 10 mg daily             -antipsychotic agents: N/A 5. Neuropsych: This patient is capable of making decisions on her own behalf. 6. Skin/Wound Care: Routine skin checks 7. Fluids/Electrolytes/Nutrition: Routine in and outs             CMP ordered for tomorrow 8.  Hyperlipidemia: Lipitor 9.  GERD.  Protonix 10.  Hypothyroidism: Synthroid 11.  Hypokalemia             resolved 3.7 on 8/18    LOS: 1 days A FACE TO FACE EVALUATION WAS PERFORMED  9/18 08/23/2020, 9:31 AM

## 2020-08-24 ENCOUNTER — Telehealth (HOSPITAL_COMMUNITY): Payer: Self-pay | Admitting: Pharmacist

## 2020-08-24 NOTE — Progress Notes (Signed)
Speech Language Pathology Daily Session Note  Patient Details  Name: Vicki Gonzales MRN: [id number] Date of Birth: 1965-11-24  Today's Date: 08/24/2020 SLP Individual Time: 1005-1100 SLP Individual Time Calculation (min): 55 min  Short Term Goals: Week 1: SLP Short Term Goal 1 (Week 1): STG=LTG due to ELOS  Skilled Therapeutic Interventions: Patient agreeable to skilled ST intervention with focus on cognitive goals. Patient was accompanied by her spouse this date who was receptive to education throughout session to maximize attention, memory, and safety awareness. SLP facilitated working memory and selective attention using progressive sequential memory with the BITS. Patient able to recall 2-8 words and numbers in sequential order with approximately 88% accuracy and sup-to-min A verbal cues for recall/working memory. Patient sustained attention using BITS for approximately 15 minutes and benefited from sup A verbal cues to redirect and 2-3 minute breaks between trials within mildly distracting environment. Facilitated problem solving/reasoning and mental manipulation task in quiet environment with sup A fading to mod I with word scramble-like activity "word scapes." Patient required verbal cue to remember to lock her wheelchair breaks before standing upon returning to her room to transfer back to bed. Patient was left in bed with alarm activated and immediate needs within reach at end of session. Continue per current plan of care.      Pain Pain Assessment Pain Scale: 0-10 Pain Score: 0-No pain  Therapy/Group: Individual Therapy  Meta Kroenke T Aira Sallade 08/24/2020, 11:00 AM

## 2020-08-24 NOTE — Progress Notes (Signed)
Physical Therapy Session Note  Patient Details  Name: Vicki Gonzales MRN: [id number] Date of Birth: 11/17/1965  Today's Date: 08/24/2020 PT Individual Time: 0901-0958 PT Individual Time Calculation (min): 57 min   Short Term Goals: Week 1:  PT Short Term Goal 1 (Week 1): STG=LTG due to ELOS  Skilled Therapeutic Interventions/Progress Updates:  Patient seated on EOB on entrance to room. Husband present. Patient alert and agreeable to PT session. Patient denied pain throughout session. Husband invited to session as pt's ELOS is expected to be short and education can begin now.   Therapeutic Activity: Transfers: Patient performed STS and SPVT transfers throughout session with CGA/ supervision. Focus on rise to stand to no AD with hands on thighs. VC to reach back for armrests/ seat prior to descent to sit in order to assist with controlled descent. Also provided vc for pt's impulsive initiation and brake application prior to transfers.   Gait Training:  Patient ambulated 39' x2 with CGA and w/c follow. One bout using RW and one bout with no AD. Demonstrated decreased balance with difficulty weight shifting intermittently without AD. Provided vc/ tc for narrowing pt's WBOS without AD.   Neuromuscular Re-ed: NMR facilitated during session with focus on dynamic standing balance, motor control, proprioception. Pt guided in several passes through agility ladder. First 2 passes back and forth with instructions for one step per box. Initially pt demos difficulty managing shift in weight to LLE and steps both feet into each box. But is able to improve with practice. Progressed to next 2 passes back and forth with high knee stepping one foot in each box. Again, difficulty initially and improvement noted with practice. Final passes performed with lateral stepping. Leading with RLE performed with improved performance except for first step out with decreased balance over LLEonly. Leading with LLE  performance improved withvc for increased focus and conscious practice. NMR performed for improvements in motor control and coordination, balance, sequencing, judgement, and self confidence/ efficacy in performing all aspects of mobility at highest level of independence.   Patient seated EOB at end of session with brakes locked, bed alarm set, and all needs within reach.     Therapy Documentation Precautions:  Precautions Precautions: Fall Precaution Comments: L inattention Restrictions Weight Bearing Restrictions: No  Therapy/Group: Individual Therapy  Loel Dubonnet PT, DPT 08/24/2020, 7:55 AM

## 2020-08-24 NOTE — Progress Notes (Signed)
Occupational Therapy Session Note  Patient Details  Name: Vicki Gonzales MRN: [id number] Date of Birth: Jul 06, 1965  Today's Date: 08/24/2020 OT Individual Time: 4193-7902 OT Individual Time Calculation (min): 57 min    Short Term Goals: Week 1:  OT Short Term Goal 1 (Week 1): STG = LTG 2/2 ELOS  Skilled Therapeutic Interventions/Progress Updates:    Pt received semi-reclined in bed, agreeable to therapy. Husband present and no c/o pain. Session focus on self-care retraining, activity tolerance, LUE NMR, family edu, transfer training in prep for improved ADL/IADL/func mobility performance + decreased caregiver burden. Came to sitting EOB with mod I. STS and short amb > w/c with RW + CGA for balance. Reviewed w/c part management. Total A w/c transport > gym 2/2 time management and energy conservation. Stand-pivot no AD to and from mat table with CGA. Assessed B grip strength and administered 9HPT with the following results: R: 65, 60, 55; average of 60 lbs; 25 secs L: 44, 45, 51; average of 47 lbs; 42 secs  Provided level 3 theraband and completed 1x15 of the following with LUE: biceps curls, boxer punches, horizontal shoulder abduction, shoulder rows. Reviewed exercises to complete in room for cont practice. Finally, completed 8 modified push-ups in modified quadruped position with unstable surface.   Completed transfer training education with husband reviewing w/c part management, safety precautions, use of gait belt + RW. In room, facilitate amb toilet transfer with min Vcs from therapist for technique. No void, but pt completed LB clothing management + seated pericare with S.    Pt left seated in w/c with husband present, call bell in reach, and all immediate needs met.  NT and LPN aware of updated safety plan. Husband to notify nursing if gone for the day to replace for bed alarms.   Therapy Documentation Precautions:  Precautions Precautions: Fall Precaution Comments: L  inattention Restrictions Weight Bearing Restrictions: No  Pain: denies   ADL: See Care Tool for more details.  Therapy/Group: Individual Therapy  Volanda Napoleon MS, OTR/L  08/24/2020, 6:43 AM

## 2020-08-24 NOTE — IPOC Note (Signed)
Overall Plan of Care [facility]) Patient Details Name: Vicki Gonzales MRN: [id number] DOB: 07-Oct-1965  Admitting Diagnosis: Right middle cerebral artery stroke [facility])  Hospital Problems: Principal Problem:   Right middle cerebral artery stroke [facility])     Functional Problem List: Nursing Endurance, Pain, Medication Management, Safety, Bowel  PT Sensory, Balance, Behavior, Endurance, Motor, Nutrition, Pain, Safety, Perception  OT Balance, Cognition, Endurance, Perception, Safety, Skin Integrity, Motor, Vision  SLP Cognition  TR         Basic ADL's: OT Eating, Grooming, Bathing, Dressing, Toileting     Advanced  ADL's: OT Simple Meal Preparation     Transfers: PT Bed Mobility, Bed to Chair, Car, Furniture, Floor  OT Toilet, Research scientist (life sciences): PT Ambulation, Psychologist, prison and probation services, Stairs     Additional Impairments: OT Fuctional Use of Upper Extremity  SLP Social Cognition   Problem Solving, Awareness, Attention  TR      Anticipated Outcomes Item Anticipated Outcome  Self Feeding ind  Swallowing      Basic self-care  mod I  Toileting  mod I   Bathroom Transfers mod I and S  Bowel/Bladder  manage bowel w mod I assist  Transfers  Mod I with LRAD  Locomotion  Mod I - supervision assist with LRAD; ambulatory  Communication     Cognition  mod I  Pain  at or below level 4  Safety/Judgment  maintain safety w cues/reminders   Therapy Plan: PT Intensity: Minimum of 1-2 x/day ,45 to 90 minutes PT Frequency: 5 out of 7 days PT Duration Estimated Length of Stay: 6-9 days OT Intensity: Minimum of 1-2 x/day, 45 to 90 minutes OT Frequency: 5 out of 7 days OT Duration/Estimated Length of Stay: 7 to 10 days SLP Intensity: Minumum of 1-2 x/day, 30 to 90 minutes SLP Frequency: 3 to 5 out of 7 days SLP Duration/Estimated Length of Stay: 7-10 days   Due to the current state of emergency, patients may not be receiving their 3-hours of Medicare-mandated  therapy.   Team Interventions: Nursing Interventions Disease Management/Prevention, Medication Management, Bowel Management, Pain Management, Patient/Family Education, Discharge Planning  PT interventions Ambulation/gait training, Community reintegration, DME/adaptive equipment instruction, Neuromuscular re-education, Psychosocial support, Stair training, UE/LE Strength taining/ROM, Wheelchair propulsion/positioning, Warden/ranger, Discharge planning, Functional electrical stimulation, Pain management, Skin care/wound management, Therapeutic Activities, UE/LE Coordination activities, Cognitive remediation/compensation, Disease management/prevention, Functional mobility training, Splinting/orthotics, Patient/family education, Therapeutic Exercise, Visual/perceptual remediation/compensation  OT Interventions Warden/ranger, Community reintegration, Disease mangement/prevention, Neuromuscular re-education, Patient/family education, Self Care/advanced ADL retraining, Therapeutic Exercise, UE/LE Coordination activities, Wheelchair propulsion/positioning, Visual/perceptual remediation/compensation, Cognitive remediation/compensation, Discharge planning, DME/adaptive equipment instruction, UE/LE Strength taining/ROM, Therapeutic Activities, Skin care/wound managment, Psychosocial support, Pain management, Functional mobility training  SLP Interventions Cognitive remediation/compensation, Environmental controls, Internal/external aids, Cueing hierarchy, Functional tasks, Patient/family education  TR Interventions    SW/CM Interventions Discharge Planning, Psychosocial Support, Patient/Family Education   Barriers to Discharge MD  Lack of/limited family support  Nursing Decreased caregiver support, Home environment access/layout 2 level 1 ste 1/2 bath on main, b/b up13 steps w spouse  PT Inaccessible home environment, Decreased caregiver support, Home environment Best boy,  Community education officer for SNF coverage, Behavior    OT Decreased caregiver support    SLP      SW Decreased caregiver support, Community education officer for SNF coverage     Team Discharge Planning: Destination: PT-Home ,OT- Home , SLP-Home Projected Follow-up: PT-Home health PT, OT-  Outpatient OT, SLP-Outpatient SLP Projected Equipment Needs: PT-To be determined,  Rolling walker with 5" wheels, OT- To be determined, SLP-None recommended by SLP Equipment Details: PT-rollator, OT-  Patient/family involved in discharge planning: PT- Patient, Family member/caregiver,  OT-Patient, Family member/caregiver, SLP-Patient, Family member/caregiver  MD ELOS: 4-6 d Medical Rehab Prognosis:  Excellent Assessment: 55 year old right-handed female with history of hypertension hypothyroidism, migraine headaches and tobacco use.  History taken from chart review and patient.  Patient lives with spouse independent prior to admission.  Two-level home with one-step to entry.  Good family support noted.  She presented on 08/12/2020 to [facility] with dysarthria and left hemiparesis.  Cranial CT scan unremarkable for acute intracranial process.  CT angiogram head and neck proximal right ICA occlusion with intracranial reconstitution.  Findings suspicious for subocclusive embolus versus severe stenosis at the right MCA bifurcation.  Occlusion of the left vertebral artery its origin with distal reconstitution.  Patient did receive tPA.  Echocardiogram with ejection fraction of 60-65%, no wall motion abnormalities grade 1 diastolic dysfunction.  MRI and MRA showed multifocal areas of right MCA territory infarct including a new area in the right perirolandic region.  Few small foci of susceptibility artifact within the areas of restricted diffusion.  Stable trace right subarachnoid hemorrhage.  Patient was transferred to [facility] underwent four-vessel cerebral arteriogram with revascularization of occluded right ICA on 08/14/2020 per interventional  radiology.  Hospital course further complicated by post arteriogram right groin pain findings of right femoral pseudoaneurysm underwent repair of right femoral pseudoaneurysm 08/15/2020.  Hospital course TEE showed normal ejection fraction possible PFO TCD bubble study showed [name] degree 2 with Valsalva.  Hyper coag labs thus far remain negative.  Recommendations 30-day cardiac event monitor.  Patient was cleared to begin aspirin 81 mg daily and Brilinta for CVA prophylaxis.  Maintained on Lovenox for DVT prophylaxis.  Tolerating a regular consistency diet.  Therapy evaluations completed due to patient's left-sided hemiparesis was admitted for a comprehensive rehab program.  Please see preadmission assessment from earlier today as well.    See Team Conference Notes for weekly updates to the plan of care

## 2020-08-24 NOTE — Progress Notes (Signed)
Physical Therapy Session Note  Patient Details  Name: Vicki Gonzales MRN: [id number] Date of Birth: July 05, 1965  Today's Date: 08/24/2020 PT Individual Time: 1017-5102 PT Individual Time Calculation (min): 29 min   Short Term Goals: Week 1:  PT Short Term Goal 1 (Week 1): STG=LTG due to ELOS  Skilled Therapeutic Interventions/Progress Updates:    Patient in w/c in room with spouse present. Reports therapy she has already had today.  Patient assisted in w/c to therapy gym.  Performed sit to stand with CGA and ambulated min to mod A no AD x 220' with cues for wider BOS due to occasional scissoring with R over L and cues facilitation for L hip stability at times and noting L knee popping at times (reports h/o arthroscopic surgery for meniscus on that knee before).  Patient performed side stepping at wall rail with cues for shorter stride.  Standing step taps to numbered discs on 4" step with HHA tapping 1 then 2 then 3 different discs working on single limb balance, L LE coordination/strength and memory.  Patient ambulated x 120' with head turns to ID playing cards with min A no assistive device.  Ambulated through obstacle course over and around obstacles with min A and cues for slower pace.  Patient propelled w/c x 36' with S then assisted rest of distance to her room.  Stand pivot to recliner with CGA.  Left with needs in reach and spouse in the room (who reports OT checked off earlier today to assist to bathroom.)  Therapy Documentation Precautions:  Precautions Precautions: Fall Precaution Comments: L inattention Restrictions Weight Bearing Restrictions: No  Pain: Pain Assessment Pain Score: 0-No pain     Therapy/Group: Individual Therapy  Elray Mcgregor [place], PT 08/24/2020, 5:07 PM

## 2020-08-24 NOTE — Progress Notes (Signed)
PROGRESS NOTE   Subjective/Complaints: Asking about IV saline lock.  Eating and drinking well discussed labwork Notes numbness LLE  No bowel issues  ROS- neg CP, SOB, N/V/D  Objective:   No results found. Recent Labs    08/22/20 1618 08/23/20 0600  WBC 12.6* 12.3*  HGB 10.8* 10.9*  HCT 33.2* 32.2*  PLT 440* 438*    Recent Labs    08/22/20 1618 08/23/20 0600  NA  --  139  K  --  3.7  CL  --  105  CO2  --  25  GLUCOSE  --  115*  BUN  --  11  CREATININE 0.74 0.57  CALCIUM  --  9.0     Intake/Output Summary (Last 24 hours) at 08/24/2020 3532 Last data filed at 08/23/2020 1852 Gross per 24 hour  Intake 960 ml  Output --  Net 960 ml         Physical Exam: Vital Signs Blood pressure (!) 150/76, pulse 61, temperature 98 F (36.7 C), temperature source Oral, resp. rate 16, height 5\' 6"  (1.676 m), weight 83.1 kg, last menstrual period 04/07/2019, SpO2 97 %.  General: No acute distress Mood and affect are appropriate Heart: Regular rate and rhythm no rubs murmurs or extra sounds Lungs: Clear to auscultation, breathing unlabored, no rales or wheezes Abdomen: Positive bowel sounds, soft nontender to palpation, nondistended Extremities: No clubbing, cyanosis, or edema Skin: No evidence of breakdown, no evidence of rash  Sensory exam normal sensation to light touch and proprioception in bilateral upper and lower extremities Cerebellar exam normal finger to nose to finger as well as heel to shin in bilateral upper and lower extremities Musculoskeletal: Full range of motion in all 4 extremities. No joint swelling 4+/5 on left and 5/5 on RIght    Assessment/Plan: 1. Functional deficits which require 3+ hours per day of interdisciplinary therapy in a comprehensive inpatient rehab setting. Physiatrist is providing close team supervision and 24 hour management of active medical problems listed below. Physiatrist  and rehab team continue to assess barriers to discharge/monitor patient progress toward functional and medical goals  Care Tool:  Bathing    Body parts bathed by patient: Face, Left lower leg, Right arm, Left arm, Chest, Abdomen, Front perineal area, Buttocks, Right upper leg, Right lower leg, Left upper leg         Bathing assist Assist Level: Supervision/Verbal cueing (seated)     Upper Body Dressing/Undressing Upper body dressing   What is the patient wearing?: Bra, Pull over shirt    Upper body assist Assist Level: Minimal Assistance - Patient > 75%    Lower Body Dressing/Undressing Lower body dressing      What is the patient wearing?: Underwear/pull up, Pants     Lower body assist Assist for lower body dressing: Contact Guard/Touching assist     Toileting Toileting    Toileting assist Assist for toileting: Contact Guard/Touching assist     Transfers Chair/bed transfer  Transfers assist     Chair/bed transfer assist level: Minimal Assistance - Patient > 75%     Locomotion Ambulation   Ambulation assist      Assist level: Minimal  Assistance - Patient > 75%   Max distance: 150   Walk 10 feet activity   Assist     Assist level: Minimal Assistance - Patient > 75%     Walk 50 feet activity   Assist    Assist level: Minimal Assistance - Patient > 75%      Walk 150 feet activity   Assist    Assist level: Minimal Assistance - Patient > 75%      Walk 10 feet on uneven surface  activity   Assist     Assist level: Minimal Assistance - Patient > 75%     Wheelchair     Assist Will patient use wheelchair at discharge?: No Type of Wheelchair: Manual    Wheelchair assist level: Minimal Assistance - Patient > 75% Max wheelchair distance: 150    Wheelchair 50 feet with 2 turns activity    Assist        Assist Level: Minimal Assistance - Patient > 75%   Wheelchair 150 feet activity     Assist      Assist  Level: Minimal Assistance - Patient > 75%   Blood pressure (!) 150/76, pulse 61, temperature 98 F (36.7 C), temperature source Oral, resp. rate 16, height 5\' 6"  (1.676 m), weight 83.1 kg, last menstrual period 04/07/2019, SpO2 97 %.  Medical Problem List and Plan: 1.  Left-sided weakness secondary to right MCA infarct due to right ICA occlusion status post tPA complicated by small SAH with later right ICA stenting complicated by right femoral artery pseudoaneurysm with repair 08/15/2020 per vascular surgery             -patient may shower             -ELOS/Goals: 4 to 6 days/revision/mod I            cont CIR PT, OT  2.  Antithrombotics: -DVT/anticoagulation: Lovenox Mechanical: Sequential compression devices, entire leg Bilateral lower extremities Pharmaceutical: Lovenox             -antiplatelet therapy: Aspirin 81 mg daily and Brilinta 90 mg twice daily 3. Pain Management: Tramadol as needed 4. Mood: Paxil 10 mg daily             -antipsychotic agents: N/A 5. Neuropsych: This patient is capable of making decisions on her own behalf. 6. Skin/Wound Care: Routine skin checks 7. Fluids/Electrolytes/Nutrition: Routine in and outs             CMP ordered for tomorrow 8.  Hyperlipidemia: Lipitor 9.  GERD.  Protonix 10.  Hypothyroidism: Synthroid 11.  Hypokalemia             resolved 3.7 on 8/18    LOS: 2 days A FACE TO FACE EVALUATION WAS PERFORMED  9/18 08/24/2020, 7:12 AM

## 2020-08-25 DIAGNOSIS — I63511 Cerebral infarction due to unspecified occlusion or stenosis of right middle cerebral artery: Secondary | ICD-10-CM | POA: Diagnosis not present

## 2020-08-25 MED ORDER — LISINOPRIL 5 MG PO TABS
5.0000 mg | ORAL_TABLET | Freq: Every day | ORAL | Status: DC
  Administered 2020-08-26 – 2020-08-29 (×4): 5 mg via ORAL
  Filled 2020-08-25 (×4): qty 1

## 2020-08-25 NOTE — Plan of Care (Signed)
  Problem: Consults Goal: RH STROKE PATIENT EDUCATION Description: See Patient Education module for education specifics  Outcome: Progressing   Problem: RH BOWEL ELIMINATION Goal: RH STG MANAGE BOWEL WITH ASSISTANCE Description: STG Manage Bowel with mod I Assistance. Outcome: Progressing Goal: RH STG MANAGE BOWEL W/MEDICATION W/ASSISTANCE Description: STG Manage Bowel with Medication with mod I Assistance. Outcome: Progressing   Problem: RH SAFETY Goal: RH STG ADHERE TO SAFETY PRECAUTIONS W/ASSISTANCE/DEVICE Description: STG Adhere to Safety Precautions With cues/reminders Assistance/Device. Outcome: Progressing   Problem: RH PAIN MANAGEMENT Goal: RH STG PAIN MANAGED AT OR BELOW PT'S PAIN GOAL Description: At or below level 4 Outcome: Progressing   Problem: RH KNOWLEDGE DEFICIT Goal: RH STG INCREASE KNOWLEDGE OF DIABETES Description: Patient will be able to manage prediabetes with dietary modifications using handouts and educational tools independently Outcome: Progressing Goal: RH STG INCREASE KNOWLEDGE OF HYPERTENSION Description: Patient will be able to manage HTN with medications and dietary modifications using handouts and educational tools independently Outcome: Progressing Goal: RH STG INCREASE KNOWLEGDE OF HYPERLIPIDEMIA Description: Patient will be able to manageHLD with medications and dietary modifications using handouts and educational tools independently Outcome: Progressing Goal: RH STG INCREASE KNOWLEDGE OF STROKE PROPHYLAXIS Description: Patient will be able to manage secondary stroke risk with medications and dietary modifications using handouts and educational tools independently Outcome: Progressing

## 2020-08-25 NOTE — Progress Notes (Signed)
Occupational Therapy Session Note  Patient Details  Name: Vicki Gonzales MRN: [id number] Date of Birth: 05/06/1965  Today's Date: 08/25/2020 OT Individual Time: [phone] OT Individual Time Calculation (min): 58 min + 29 min   Short Term Goals: Week 1:  OT Short Term Goal 1 (Week 1): STG = LTG 2/2 ELOS  Skilled Therapeutic Interventions/Progress Updates:    Session 1 (2060-1561) : Pt received seated EOB with husband present, no c/o pain, agreeable to therapy. Session focus on self-care retraining, activity tolerance, func transfers, home set-up edu, family edu in prep for improved ADL/IADL/func mobility performance + decreased caregiver burden. STS and amb to shower with RW and close S. Close S for side-stepping into shower with use of L grab bar and min Vcs for safety/ technique. Doffed shirt + underwear/pants with close S and min Vcs to sit prior to removing pants from legs. Husband education throughout to facilitate amb shower transfer, shower set-up, reducing falls risk. Husband purchased grab bars for shower installation and nonslip point. Bathed full-body with close S while seated on 3in1 in shower, able to bathe periarea through cut-out. Transferred out of shower by swiveling to edge of 3in1 and then completing STS. Amb transfer to bed with close S and RW. Completed full-body dressing with close S and husband able to guard balance with good technique. Stand-pivot no AD > w/c with CGA. Completed seated hair care at sink with set-up A, reviewed placing BLE in cabinet for closer access. RN present for morning meds. Total A w/c transport to and from therapy apartment 2/2 time management and energy conservation. Pt completed 1 lateral step-over shower transfer with shower chair after demonstration. Discussed optimal placement for grab bars. Husband cleared to assist pt with showering in room, NT and safety plan updated.   Pt left seated EOB with husband present, call bell in reach, and all  immediate needs met.     Session 2 [phone]): Pt received semi-reclined in bed with husband present, no c/o pain, agreeable to therapy. Session focus on self-care retraining, activity tolerance, dynamic standing balance, func transfers in prep for improved ADL/IADL/func mobility performance + decreased caregiver burden. Pt came to sitting EOB mod I. Stand-pivot with RW > w/c with close S. Total A w/c transport to gym 2/2 time management and energy conservation. In [place], pt completed 4 passes of obstacle course with no AD involving weaving in/out cones, stepping over 4 inch step, and holding cup of water. No overt LOB throughout and req seated rest break in between passes. Amb transfer to sink no AD to wash hands with CGA. Total A w/c transport back to room and stand-pivot > EOB with CGA + no AD. NT in room providing lunch.  Pt left seated EOB with husband/NT present,  call bell in reach, and all immediate needs met.    Therapy Documentation Precautions:  Precautions Precautions: Fall Precaution Comments: L inattention Restrictions Weight Bearing Restrictions: No  Pain: no c/o    ADL: See Care Tool for more details.   Therapy/Group: Individual Therapy  Volanda Napoleon MS, OTR/L  08/25/2020, 6:47 AM

## 2020-08-25 NOTE — Progress Notes (Addendum)
Physical Therapy Session Note  Patient Details  Name: Vicki Gonzales MRN: [id number] Date of Birth: [birth year]  Today's Date: 08/25/2020 PT Individual Time: 1305-1405 PT Individual Time Calculation (min): 60 min   Short Term Goals: Week 1:  PT Short Term Goal 1 (Week 1): STG=LTG due to ELOS   Skilled Therapeutic Interventions/Progress Updates:   Pt received sitting EOB and agreeable to PT. Ambulatory transfer to [facility] with rollator and supervision assist. Cues for AD parts management.   Gait training with and without rollator x 140f each with supervision assist from PT with rollator and CGA without AD. Weave through 6 cones with rollator supervision asist and cues for attention to cones on the L.   Dynamic gait training with agility ladder each completed x 4 :  1 foot in each space.  Side stepping Out/in SLS with 3 sec hold.  Min assist throughout to prevent lateral LOB to the L as well as min-max cues for proper sequencing of movement to follow instruction  BITS maze RUE x 2( no errors, 15-20 sec) LUE x 2. 4 and 6 errors, 30 sec Visual pursuits 30 targets in 3 field sequence(red, white blue) R UE x 1 and LUE x 1.  Visual scanning number/letter sequence 1-10, A-J.   Pt returned to room and performed ambulatory transfer to bed with CGA and no AD. Sit>supine completed without assist, and left supine in bed with call bell in reach and all needs met.         Therapy Documentation Precautions:  Precautions Precautions: Fall Precaution Comments: L inattention Restrictions Weight Bearing Restrictions: No  Vital Signs: Therapy Vitals Temp: 98.5 F (36.9 C) Temp Source: Oral Pulse Rate: 73 Resp: 16 BP: (!) 151/85 Patient Position (if appropriate): Lying Oxygen Therapy SpO2: 96 % O2 Device: Room Air Pain: denies    Therapy/Group: Individual Therapy  ALorie Phenix8/20/2022, 2:08 PM

## 2020-08-25 NOTE — Progress Notes (Signed)
PROGRESS NOTE   Subjective/Complaints: No issues overnight, slept very well.  Had a good day in therapy yesterday. Has some numbness left leg since stroke ROS- neg CP, SOB, N/V/D  Objective:   No results found. Recent Labs    08/22/20 1618 08/23/20 0600  WBC 12.6* 12.3*  HGB 10.8* 10.9*  HCT 33.2* 32.2*  PLT 440* 438*    Recent Labs    08/22/20 1618 08/23/20 0600  NA  --  139  K  --  3.7  CL  --  105  CO2  --  25  GLUCOSE  --  115*  BUN  --  11  CREATININE 0.74 0.57  CALCIUM  --  9.0     Intake/Output Summary (Last 24 hours) at 08/25/2020 0741 Last data filed at 08/25/2020 0731 Gross per 24 hour  Intake 1680 ml  Output --  Net 1680 ml         Physical Exam: Vital Signs Blood pressure (!) 149/86, pulse 67, temperature 98.1 F (36.7 C), temperature source Oral, resp. rate 14, height 5\' 6"  (1.676 m), weight 83.1 kg, last menstrual period 04/07/2019, SpO2 97 %.   General: No acute distress Mood and affect are appropriate Heart: Regular rate and rhythm no rubs murmurs or extra sounds Lungs: Clear to auscultation, breathing unlabored, no rales or wheezes Abdomen: Positive bowel sounds, soft nontender to palpation, nondistended Extremities: No clubbing, cyanosis, or edema Skin: No evidence of breakdown, no evidence of rash   Sensory exam normal sensation to light touch and proprioception in bilateral upper and lower extremities Cerebellar exam normal finger to nose to finger as well as heel to shin in bilateral upper and lower extremities Musculoskeletal: Full range of motion in all 4 extremities. No joint swelling 4+/5 on left and 5/5 on RIght    Assessment/Plan: 1. Functional deficits which require 3+ hours per day of interdisciplinary therapy in a comprehensive inpatient rehab setting. Physiatrist is providing close team supervision and 24 hour management of active medical problems listed  below. Physiatrist and rehab team continue to assess barriers to discharge/monitor patient progress toward functional and medical goals  Care Tool:  Bathing    Body parts bathed by patient: Face, Left lower leg, Right arm, Left arm, Chest, Abdomen, Front perineal area, Buttocks, Right upper leg, Right lower leg, Left upper leg         Bathing assist Assist Level: Supervision/Verbal cueing (seated)     Upper Body Dressing/Undressing Upper body dressing   What is the patient wearing?: Bra, Pull over shirt    Upper body assist Assist Level: Minimal Assistance - Patient > 75%    Lower Body Dressing/Undressing Lower body dressing      What is the patient wearing?: Pants     Lower body assist Assist for lower body dressing: Contact Guard/Touching assist     Toileting Toileting    Toileting assist Assist for toileting: Contact Guard/Touching assist     Transfers Chair/bed transfer  Transfers assist     Chair/bed transfer assist level: Contact Guard/Touching assist     Locomotion Ambulation   Ambulation assist      Assist level: Minimal Assistance -  Patient > 75%   Max distance: 150   Walk 10 feet activity   Assist     Assist level: Minimal Assistance - Patient > 75%     Walk 50 feet activity   Assist    Assist level: Minimal Assistance - Patient > 75%      Walk 150 feet activity   Assist    Assist level: Minimal Assistance - Patient > 75%      Walk 10 feet on uneven surface  activity   Assist     Assist level: Minimal Assistance - Patient > 75%     Wheelchair     Assist Will patient use wheelchair at discharge?: No Type of Wheelchair: Manual    Wheelchair assist level: Minimal Assistance - Patient > 75% Max wheelchair distance: 150    Wheelchair 50 feet with 2 turns activity    Assist        Assist Level: Minimal Assistance - Patient > 75%   Wheelchair 150 feet activity     Assist      Assist  Level: Minimal Assistance - Patient > 75%   Blood pressure (!) 149/86, pulse 67, temperature 98.1 F (36.7 C), temperature source Oral, resp. rate 14, height 5\' 6"  (1.676 m), weight 83.1 kg, last menstrual period 04/07/2019, SpO2 97 %.  Medical Problem List and Plan: 1.  Left-sided weakness secondary to right MCA infarct due to right ICA occlusion status post tPA complicated by small SAH with later right ICA stenting complicated by right femoral artery pseudoaneurysm with repair 08/15/2020 per vascular surgery             -patient may shower             -ELOS/Goals: 4 to 6 days/revision/mod I            cont CIR PT, OT  2.  Antithrombotics: -DVT/anticoagulation: Lovenox Mechanical: Sequential compression devices, entire leg Bilateral lower extremities Pharmaceutical: Lovenox             -antiplatelet therapy: Aspirin 81 mg daily and Brilinta 90 mg twice daily 3. Pain Management: Tramadol as needed 4. Mood: Paxil 10 mg daily             -antipsychotic agents: N/A 5. Neuropsych: This patient is capable of making decisions on her own behalf. 6. Skin/Wound Care: Routine skin checks 7. Fluids/Electrolytes/Nutrition: Routine in and outs, good intake for fluids             CMP unremarkable 8.  Hyperlipidemia: Lipitor 9.  GERD.  Protonix 10.  Hypothyroidism: Synthroid 11.  Hypokalemia             resolved 3.7 on 8/18    LOS: 3 days A FACE TO FACE EVALUATION WAS PERFORMED  9/18 08/25/2020, 7:41 AM

## 2020-08-25 NOTE — Progress Notes (Signed)
Physical Therapy Session Note  Patient Details  Name: Vicki Gonzales MRN: [id number] Date of Birth: 30-Apr-1965  Today's Date: 08/25/2020 PT Individual Time: 0940-7680 PT Individual Time Calculation (min): 40 min   Short Term Goals: Week 1:  PT Short Term Goal 1 (Week 1): STG=LTG due to ELOS   Skilled Therapeutic Interventions/Progress Updates:   Pt received supine in bed and agreeable to PT. Supine>sit transfer without assist or cues. Stand pivot transfer to [facility] with supervision assist and no AD. Pt transported to entrance of [facility] in [place]. Gait training with ROllator on unlevel cement sidewalk 275f, [id number] through hospital atrium/hallway [id number] assist from PT for gait training with Rollator with min cues for improved heel contact on the LLE to improve step height/length as well as instruction for decreased speed and erect posture.  Gait training without AD 2 x 200ft36fh min assist from PT for safety with cues for attention to task as well as improved step width due to intermittent adducted step pattern with the LLE.  Pt returned to room and performed ambulatory transfer to sink for hand hygiene as then to bed with CGA for safety without AD.  Sit>supine completed without assist, and left supine in bed with call bell in reach and all needs met.        Therapy Documentation Precautions:  Precautions Precautions: Fall Precaution Comments: L inattention Restrictions Weight Bearing Restrictions: No    Vital Signs: Therapy Vitals Temp: 98.5 F (36.9 C) Temp Source: Oral Pulse Rate: 73 Resp: 16 BP: (!) 151/85 Patient Position (if appropriate): Lying Oxygen Therapy SpO2: 96 % O2 Device: Room Air Pain: denies    Therapy/Group: Individual Therapy  AustiLorie Phenix/2022, 4:41 PM

## 2020-08-26 NOTE — Plan of Care (Signed)
  Problem: Consults Goal: RH STROKE PATIENT EDUCATION Description: See Patient Education module for education specifics  Outcome: Progressing   Problem: RH BOWEL ELIMINATION Goal: RH STG MANAGE BOWEL WITH ASSISTANCE Description: STG Manage Bowel with mod I Assistance. Outcome: Progressing Goal: RH STG MANAGE BOWEL W/MEDICATION W/ASSISTANCE Description: STG Manage Bowel with Medication with mod I Assistance. Outcome: Progressing   Problem: RH SAFETY Goal: RH STG ADHERE TO SAFETY PRECAUTIONS W/ASSISTANCE/DEVICE Description: STG Adhere to Safety Precautions With cues/reminders Assistance/Device. Outcome: Progressing   Problem: RH PAIN MANAGEMENT Goal: RH STG PAIN MANAGED AT OR BELOW PT'S PAIN GOAL Description: At or below level 4 Outcome: Progressing   Problem: RH KNOWLEDGE DEFICIT Goal: RH STG INCREASE KNOWLEDGE OF DIABETES Description: Patient will be able to manage prediabetes with dietary modifications using handouts and educational tools independently Outcome: Progressing Goal: RH STG INCREASE KNOWLEDGE OF HYPERTENSION Description: Patient will be able to manage HTN with medications and dietary modifications using handouts and educational tools independently Outcome: Progressing Goal: RH STG INCREASE KNOWLEGDE OF HYPERLIPIDEMIA Description: Patient will be able to manageHLD with medications and dietary modifications using handouts and educational tools independently Outcome: Progressing Goal: RH STG INCREASE KNOWLEDGE OF STROKE PROPHYLAXIS Description: Patient will be able to manage secondary stroke risk with medications and dietary modifications using handouts and educational tools independently Outcome: Progressing   

## 2020-08-27 DIAGNOSIS — I63511 Cerebral infarction due to unspecified occlusion or stenosis of right middle cerebral artery: Secondary | ICD-10-CM | POA: Diagnosis not present

## 2020-08-27 NOTE — Progress Notes (Signed)
Occupational Therapy Session Note  Patient Details  Name: Vicki Gonzales MRN: [id number] Date of Birth: Feb 10, 1965  Today's Date: 08/27/2020 OT Individual Time: 1000-1100 OT Individual Time Calculation (min): 60 min    Short Term Goals: Week 1:  OT Short Term Goal 1 (Week 1): STG = LTG 2/2 ELOS  Skilled Therapeutic Interventions/Progress Updates:    Pt received in bed and she and her husband stated she showered and dressed earlier this am. Pt sat to EOB and then used rollator in hallway to practice ambulating 100 ft with brisk gait pattern.  She then was taken to gym via wc for time conservation.  In gym pt sat on mat and worked on a wide variety of exercises to challenge balance: -sit to stands holding 3# bar with over head reaches -standing and twisting bar side to side -standing with lateral reaches to knees -using gym ball, "kettlebell" swings and cross chops in each direction -bouncing and catching ball  Standing at // bars, heel raises, squats, hip abd lifts, and plies to challenge LLE strength and coordination.  Pt returned to room and then ambulated to sink to wash hands and then layed back down.  Bed alarm set an all needs met.  Excellent participation with pt taking rest breaks as needed.   Therapy Documentation Precautions:  Precautions Precautions: Fall Precaution Comments: L inattention Restrictions Weight Bearing Restrictions: No    Vital Signs: Therapy Vitals Temp: 97.9 F (36.6 C) Temp Source: Oral Pulse Rate: 60 Resp: 17 BP: (!) 141/79 Oxygen Therapy SpO2: 98 % Pain:  No c/o pain ADL: ADL Eating: Set up Where Assessed-Eating: Wheelchair Grooming: Supervision/safety Where Assessed-Grooming: Standing at sink, Sitting at sink Upper Body Bathing: Supervision/safety Where Assessed-Upper Body Bathing: Shower Lower Body Bathing: Contact guard Where Assessed-Lower Body Bathing: Shower Upper Body Dressing: Supervision/safety Where  Assessed-Upper Body Dressing: Sitting at sink Lower Body Dressing: Contact guard Where Assessed-Lower Body Dressing: Standing at sink Toileting: Contact guard Where Assessed-Toileting: Glass blower/designer: Therapist, music Method: Counselling psychologist: Raised toilet seat, Grab bars Tub/Shower Transfer: Metallurgist Method: Optometrist: Facilities manager: Not assessed   Therapy/Group: Individual Therapy  [place] 08/27/2020, 8:30 AM

## 2020-08-27 NOTE — Progress Notes (Signed)
Physical Therapy Session Note  Patient Details  Name: Vicki Gonzales MRN: [id number] Date of Birth: [birth year]  Today's Date: 08/27/2020 PT Individual Time: 0800-0915 PT Individual Time Calculation (min): 75 min   Short Term Goals: Week 1:  PT Short Term Goal 1 (Week 1): STG=LTG due to ELOS   Skilled Therapeutic Interventions/Progress Updates:    Pt received sitting in wc sink side with her husband present. Pt agreeable to therapy. Spouse present during therapy for education.   STS throughout session with CGA-close supervision, intermittent reminder to lock brakes prior to standing up or sitting down for safety.   Stair training in gym 3x4 6" steps CGA. Verbal cuing for proper LE technique throughout with pt intermittently forgetting and stepping up with LLE and/or reciprocally. Verbal cuing for slow and controlled navigation with stairs. 3rd round instructed pt on one railing to repair for training in stairwell. Pt taken to stairwell to complete 11 6" steps x2 for simulation of home stair step up CGA and single UE on handrail. First time, therapist demonstrated safe guarding with RUE on handrail. Second, spouse demonstrated safe guarding CGA.   Cone 3 way taps for hip strengthening, single leg stance balance, and stance leg stabilization. MinA for maintaining balance. Forward, lateral, backwards 2x10 each leg. Second round completed with single UE support on counter for improved mechanics and simulation of exercise in home set up.   Forward gait training 53ft x4 no AD CGA. Cone weaving over 35ft x4 no AD CGA. Pt demonstrated good step through gait pattern with consistent heel strike and toe off. No instances of LOB. Intermittent verbal cuing for controlled movement patterns with gait and pt reminding self to "not rush."   Environmental scanning with 5 color circles in hall over ~97ft distance no AD CGA. First round, locating circles. Second round, locating within a specific color  pattern. Pt verbalized and demonstrated good memory of color pattern requiring no cuing. Third round, locating with a new color pattern. Pt required intermittent cuing and assistance for remembering color pattern. Pt able to recall 2/5 this round and one instance of catching right LE toes on ground 2/2 dual task. Pt education provided on importance of maintaining controlled movements and focus on ambulatory task when dual tasking for safety when returning home.   Gait training ~363ft with rollator CGA-close supervision back towards room with one standing break in elevator. Verbal cuing to promote improved posture. Pt demonstrated increased step length bilaterally/gait speed which improved safety and proximity to rollator with verbal cuing.   Pt sitting EOB with husband present, bed alarm on, and call bell within reach.   Therapy Documentation Precautions:  Precautions Precautions: Fall Precaution Comments: L inattention Restrictions Weight Bearing Restrictions: No  Pain: Pain Assessment Pain Scale: 0-10 Pain Score: 0-No pain Faces Pain Scale: No hurt    Therapy/Group: Individual Therapy  Danel Requena, SPT 08/27/2020, 12:10 PM

## 2020-08-27 NOTE — Plan of Care (Signed)
Patient and husband educated on Stroke prevention as well as S/S of a Stroke. Continent of B&B

## 2020-08-27 NOTE — Progress Notes (Signed)
Speech Language Pathology Daily Session Note  Patient Details  Name: Dajane Valli MRN: [id number] Date of Birth: [birth year]  Today's Date: 08/27/2020 SLP Individual Time: 1300-1345 SLP Individual Time Calculation (min): 45 min  Short Term Goals: Week 1: SLP Short Term Goal 1 (Week 1): STG=LTG due to ELOS  Skilled Therapeutic Interventions: Patient agreeable to skilled ST intervention with focus on cognitive goals. SLP facilitated session by providing min A progressing to sup A verbal cues for planning, organization, and problem solving with mildly complex pill organization task. Patient identified medication administration errors in BID pillbox with 75% accuracy progressing to 100% accuracy with cues to slow down and double check. Required increased verbal explanation for comprehension of "take one tablet twice daily". Patient's self monitoring improved as task progressed. Patient was left in bed with alarm activated and immediate needs within reach at end of session. Continue per current plan of care.      Pain Pain Assessment Pain Scale: 0-10 Pain Score: 0-No pain Faces Pain Scale: No hurt  Therapy/Group: Individual Therapy  Tamala Ser 08/27/2020, 1:52 PM

## 2020-08-27 NOTE — Progress Notes (Signed)
PROGRESS NOTE   Subjective/Complaints: Had questions about air travel as well as bleeding in brain ROS- neg CP, SOB, N/V/D  Objective:   No results found. No results for input(s): WBC, HGB, HCT, PLT in the last 72 hours.  No results for input(s): NA, K, CL, CO2, GLUCOSE, BUN, CREATININE, CALCIUM in the last 72 hours.   Intake/Output Summary (Last 24 hours) at 08/27/2020 0803 Last data filed at 08/26/2020 1834 Gross per 24 hour  Intake 436 ml  Output --  Net 436 ml         Physical Exam: Vital Signs Blood pressure (!) 141/79, pulse 60, temperature 97.9 F (36.6 C), temperature source Oral, resp. rate 17, height 5\' 6"  (1.676 m), weight 83.1 kg, last menstrual period 04/07/2019, SpO2 98 %.  General: No acute distress Mood and affect are appropriate Heart: Regular rate and rhythm no rubs murmurs or extra sounds Lungs: Clear to auscultation, breathing unlabored, no rales or wheezes Abdomen: Positive bowel sounds, soft nontender to palpation, nondistended Extremities: No clubbing, cyanosis, or edema Skin: No evidence of breakdown, no evidence of rash  Sensory exam normal sensation to light touch and proprioception in bilateral upper and lower extremities Cerebellar exam normal finger to nose to finger as well as heel to shin in bilateral upper and lower extremities Musculoskeletal: Full range of motion in all 4 extremities. No joint swelling Motor strength 4+/5 on left and 5/5 on RIght    Assessment/Plan: 1. Functional deficits which require 3+ hours per day of interdisciplinary therapy in a comprehensive inpatient rehab setting. Physiatrist is providing close team supervision and 24 hour management of active medical problems listed below. Physiatrist and rehab team continue to assess barriers to discharge/monitor patient progress toward functional and medical goals  Care Tool:  Bathing    Body parts bathed by  patient: Face, Left lower leg, Right arm, Left arm, Chest, Abdomen, Front perineal area, Buttocks, Right upper leg, Right lower leg, Left upper leg         Bathing assist Assist Level: Supervision/Verbal cueing (seated)     Upper Body Dressing/Undressing Upper body dressing   What is the patient wearing?: Bra, Pull over shirt    Upper body assist Assist Level: Minimal Assistance - Patient > 75%    Lower Body Dressing/Undressing Lower body dressing      What is the patient wearing?: Pants     Lower body assist Assist for lower body dressing: Contact Guard/Touching assist     Toileting Toileting    Toileting assist Assist for toileting: Contact Guard/Touching assist     Transfers Chair/bed transfer  Transfers assist     Chair/bed transfer assist level: Contact Guard/Touching assist     Locomotion Ambulation   Ambulation assist      Assist level: Minimal Assistance - Patient > 75%   Max distance: 150   Walk 10 feet activity   Assist     Assist level: Minimal Assistance - Patient > 75%     Walk 50 feet activity   Assist    Assist level: Minimal Assistance - Patient > 75%      Walk 150 feet activity   Assist  Assist level: Minimal Assistance - Patient > 75%      Walk 10 feet on uneven surface  activity   Assist     Assist level: Minimal Assistance - Patient > 75%     Wheelchair     Assist Is the patient using a wheelchair?: No Type of Wheelchair: Manual    Wheelchair assist level: Minimal Assistance - Patient > 75% Max wheelchair distance: 150    Wheelchair 50 feet with 2 turns activity    Assist        Assist Level: Minimal Assistance - Patient > 75%   Wheelchair 150 feet activity     Assist      Assist Level: Minimal Assistance - Patient > 75%   Blood pressure (!) 141/79, pulse 60, temperature 97.9 F (36.6 C), temperature source Oral, resp. rate 17, height 5\' 6"  (1.676 m), weight 83.1 kg, last  menstrual period 04/07/2019, SpO2 98 %.  Medical Problem List and Plan: 1.  Left-sided weakness secondary to right MCA infarct due to right ICA occlusion status post tPA complicated by small SAH with later right ICA stenting complicated by right femoral artery pseudoaneurysm with repair 08/15/2020 per vascular surgery             -patient may shower             -ELOS/Goals: 4 to 6 days/revision/mod I            cont CIR PT, OT  2.  Antithrombotics: -DVT/anticoagulation: Lovenox Mechanical: Sequential compression devices, entire leg Bilateral lower extremities Pharmaceutical: Lovenox             -antiplatelet therapy: Aspirin 81 mg daily and Brilinta 90 mg twice daily 3. Pain Management: Tramadol as needed 4. Mood: Paxil 10 mg daily             -antipsychotic agents: N/A 5. Neuropsych: This patient is capable of making decisions on her own behalf. 6. Skin/Wound Care: Routine skin checks 7. Fluids/Electrolytes/Nutrition: Routine in and outs, good intake for fluids             CMP unremarkable 8.  Hyperlipidemia: Lipitor 9.  GERD.  Protonix 10.  Hypothyroidism: Synthroid 11.  Hypokalemia             resolved 3.7 on 8/18    LOS: 5 days A FACE TO FACE EVALUATION WAS PERFORMED  9/18 08/27/2020, 8:03 AM

## 2020-08-28 NOTE — Progress Notes (Signed)
Occupational Therapy Session Note  Patient Details  Name: Vicki Gonzales MRN: [id number] Date of Birth: 1965/08/08  Today's Date: 08/28/2020 OT Individual Time: 0702-0818 OT Individual Time Calculation (min): 76 min + 40 min   Short Term Goals: Week 1:  OT Short Term Goal 1 (Week 1): STG = LTG 2/2 ELOS  Skilled Therapeutic Interventions/Progress Updates:    Pt received seated up in bed, no c/o pain, agreeable to therapy. Session focus on self-care retraining, activity tolerance, L visual scanning, family education, daily routine management, func mobility, safety education in prep for improved ADL/IADL/func mobility performance + decreased caregiver burden. STS and amb to and from toilet with mod I and rolator, initially min Vcs for rollator placement and to lock brakes prior to transferring. Completed toileting tasks post continent void of bladder mod I. Pt with concerns re returning to daily routine upon DC. Reviewed energy conservation techniques, recovery timeline, and importance of easing back into daily routine. Short ambulatory transfer to sink, pt completed hand and oral hygiene with mod I. Short ambulatory transfer to shower with min A for Rolator placement/unlocking brakes. Side-step into shower with close S and use of grab bars. Doffed shirt/underwear/pants/socks with mod I at STS level in shower. Completed full-body bathing with close S while seated, able to cleanse periarea throughout cutout in 3in1. Amb transfer > bed with S for safe AD use.  Completed full-body dressing with overall mod I and min A to adjust shoes over B heels. Reports typically uses shoe horn at baseline. Husband present half-way through session and reports he will be off work to be home until Labor Day to provide 24/7 S. MD in/out morning assessment and discussed potential DC date of tomorrow. Amb 200+ ft in hallway, reviewing safe rollater use (parking against wall when taking seated rest break) and discussed L  visual scanning strategies (lighthosue, four corners methods). Req seated rest break half-way through.   Pt left seated EOB with husband present with call bell in reach, and all immediate needs met.    Session 2 (1350-1430): Pt received asleep in bed, easily awakened to voice, denies pain, agreeable to therapy. Session focus on self-care retraining, activity tolerance, CVA risk education, falls recovery education, L visual scanning, dynamic standing balance, IADL retraining in prep for improved ADL/IADL/func mobility performance + decreased caregiver burden. Pt came to sitting EOB with ind. DC reassessments completed as documented in DC note. Amb to and from hospital atrium with close S for safe rollator use/L visual scanning 2/2 increased distance. In gift shop, able to locate 5 items on "shopping list" given verbally, only requiring repeat of list x1. Good use of visual scanning to locate items within reasonable time. Reviewed CVA prevention and risk factors, provided printer sheet with BE FAST acronym. Reports having been completely unaware that she had a stroke at the time of incident. Reviewed falls recovery steps (checking for injuries, having charged cell phone on person, etc.) and pt able to complete floor transfer x1 with good technique. Pt made mod I in room, NT made aware. Pt verbalized understanding to have S from husband for showering in room.  Pt left seated EOB with call bell in reach, and all immediate needs met.    Therapy Documentation Precautions:  Precautions Precautions: Fall Precaution Comments: L inattention Restrictions Weight Bearing Restrictions: No  Pain: denies   ADL: See Care Tool for more details.  Therapy/Group: Individual Therapy  Volanda Napoleon MS, OTR/L  08/28/2020, 6:46 AM

## 2020-08-28 NOTE — Plan of Care (Signed)
  Problem: RH Balance Goal: LTG Patient will maintain dynamic standing with ADLs (OT) Description: LTG:  Patient will maintain dynamic standing balance with assist during activities of daily living (OT)  Outcome: Completed/Met   Problem: Sit to Stand Goal: LTG:  Patient will perform sit to stand in prep for activites of daily living with assistance level (OT) Description: LTG:  Patient will perform sit to stand in prep for activites of daily living with assistance level (OT) Outcome: Completed/Met   Problem: RH Eating Goal: LTG Patient will perform eating w/assist, cues/equip (OT) Description: LTG: Patient will perform eating with assist, with/without cues using equipment (OT) Outcome: Completed/Met   Problem: RH Grooming Goal: LTG Patient will perform grooming w/assist,cues/equip (OT) Description: LTG: Patient will perform grooming with assist, with/without cues using equipment (OT) Outcome: Completed/Met   Problem: RH Bathing Goal: LTG Patient will bathe all body parts with assist levels (OT) Description: LTG: Patient will bathe all body parts with assist levels (OT) Outcome: Completed/Met   Problem: RH Dressing Goal: LTG Patient will perform upper body dressing (OT) Description: LTG Patient will perform upper body dressing with assist, with/without cues (OT). Outcome: Completed/Met Goal: LTG Patient will perform lower body dressing w/assist (OT) Description: LTG: Patient will perform lower body dressing with assist, with/without cues in positioning using equipment (OT) Outcome: Completed/Met   Problem: RH Toileting Goal: LTG Patient will perform toileting task (3/3 steps) with assistance level (OT) Description: LTG: Patient will perform toileting task (3/3 steps) with assistance level (OT)  Outcome: Completed/Met   Problem: RH Functional Use of Upper Extremity Goal: LTG Patient will use RT/LT upper extremity as a (OT) Description: LTG: Patient will use right/left upper  extremity as a stabilizer/gross assist/diminished/nondominant/dominant level with assist, with/without cues during functional activity (OT) Outcome: Completed/Met   Problem: RH Simple Meal Prep Goal: LTG Patient will perform simple meal prep w/assist (OT) Description: LTG: Patient will perform simple meal prep with assistance, with/without cues (OT). Outcome: Completed/Met   Problem: RH Toilet Transfers Goal: LTG Patient will perform toilet transfers w/assist (OT) Description: LTG: Patient will perform toilet transfers with assist, with/without cues using equipment (OT) Outcome: Completed/Met   Problem: RH Tub/Shower Transfers Goal: LTG Patient will perform tub/shower transfers w/assist (OT) Description: LTG: Patient will perform tub/shower transfers with assist, with/without cues using equipment (OT) Outcome: Completed/Met   Problem: RH Furniture Transfers Goal: LTG Patient will perform furniture transfers w/assist (OT/PT) Description: LTG: Patient will perform furniture transfers  with assistance (OT/PT). Outcome: Completed/Met

## 2020-08-28 NOTE — Progress Notes (Signed)
Speech Language Pathology Discharge Summary  Patient Details  Name: Vicki Gonzales MRN: [id number] Date of Birth: 20-Oct-1965  Today's Date: 08/28/2020 SLP Individual Time: 3009-2330 SLP Individual Time Calculation (min): 45 min  Skilled Therapeutic Interventions: Patient agreeable to skilled ST intervention with focus on cognitive goals and education with spouse prior to discharge scheduled tomorrow. SLP facilitated session by providing overall sup A verbal cues for BID pillbox organization. Patient organized medications with 80% accuracy at supervision level and was known to confuse medication labels (take one pill twice daily, vs. take two pills once daily, vs. take two pills twice daily). Patient's spouse was known to quickly intervene in attempt to bring awareness to patient's errors, however he was educated and encouraged to allow patient additional processing time and providing the opportunity for patient to self monitor and self correct errors before providing assistance. With additional processing time, patient self corrected errors during ~90% of occasions. Patient plans to use BID pillbox organizer at discharge and is recommended to have supervision from her spouse with pillbox organization for safety and accuracy. Both verbalized agreement and understanding.  Patient was left in bed with alarm activated and immediate needs within reach at end of session. Continue per current plan of care.    Patient has met 3 of 3 long term goals.  Patient to discharge at overall Modified Independent;Supervision level.  Reasons goals not met: NA; all goals achieved   Clinical Impression/Discharge Summary:   Patient has made excellent gains and has met 3 of 3 long-term goals this admission due to improved selective attention, emergent awareness, self monitoring and error awareness, and problem solving skills. Patient is currently an overall mod I-to-sup A for higher level cognitive tasks and will  require supervision for iADLs such as money and medication management which was particularly discussed with patient and spouse prior to discharge. Both verbalized understanding and agreement. Patient and family education has been completed and patient's care partner is independent to provide the necessary physical and cognitive assistance at discharge. Patient would benefit from follow up SLP services as outpatient to further maximize cognitive-linguistic function and functional independence.    Care Partner:  Caregiver Able to Provide Assistance: Yes  Type of Caregiver Assistance: Cognitive;Physical  Recommendation:  Outpatient SLP  Rationale for SLP Follow Up: Maximize cognitive function and independence   Equipment: NA   Reasons for discharge: Treatment goals met;Discharged from hospital   Patient/Family Agrees with Progress Made and Goals Achieved: Yes    Patty Sermons 08/28/2020, 4:15 PM

## 2020-08-28 NOTE — Progress Notes (Signed)
PROGRESS NOTE   Subjective/Complaints: Per OT, ready for d/c tomorrow ,  ROS- neg CP, SOB, N/V/D  Objective:   No results found. No results for input(s): WBC, HGB, HCT, PLT in the last 72 hours.  No results for input(s): NA, K, CL, CO2, GLUCOSE, BUN, CREATININE, CALCIUM in the last 72 hours.   Intake/Output Summary (Last 24 hours) at 08/28/2020 0754 Last data filed at 08/27/2020 1800 Gross per 24 hour  Intake --  Output 1 ml  Net -1 ml         Physical Exam: Vital Signs Blood pressure (!) 152/78, pulse 62, temperature 98 F (36.7 C), temperature source Oral, resp. rate 17, height 5\' 6"  (1.676 m), weight 83.1 kg, last menstrual period 04/07/2019, SpO2 97 %.   General: No acute distress Mood and affect are appropriate Heart: Regular rate and rhythm no rubs murmurs or extra sounds Lungs: Clear to auscultation, breathing unlabored, no rales or wheezes Abdomen: Positive bowel sounds, soft nontender to palpation, nondistended Extremities: No clubbing, cyanosis, or edema Skin: No evidence of breakdown, no evidence of rash Neurologic: alert and oriented, no dysarthria, mildly reduced sensation LLE Musculoskeletal: Full range of motion in all 4 extremities. No joint swelling Motor strength 4+/5 on left and 5/5 on RIght    Assessment/Plan: 1. Functional deficits which require 3+ hours per day of interdisciplinary therapy in a comprehensive inpatient rehab setting. Physiatrist is providing close team supervision and 24 hour management of active medical problems listed below. Physiatrist and rehab team continue to assess barriers to discharge/monitor patient progress toward functional and medical goals  Care Tool:  Bathing    Body parts bathed by patient: Face, Left lower leg, Right arm, Left arm, Chest, Abdomen, Front perineal area, Buttocks, Right upper leg, Right lower leg, Left upper leg         Bathing assist  Assist Level: Supervision/Verbal cueing (seated)     Upper Body Dressing/Undressing Upper body dressing   What is the patient wearing?: Bra, Pull over shirt    Upper body assist Assist Level: Minimal Assistance - Patient > 75%    Lower Body Dressing/Undressing Lower body dressing      What is the patient wearing?: Pants     Lower body assist Assist for lower body dressing: Contact Guard/Touching assist     Toileting Toileting    Toileting assist Assist for toileting: Contact Guard/Touching assist     Transfers Chair/bed transfer  Transfers assist     Chair/bed transfer assist level: Contact Guard/Touching assist     Locomotion Ambulation   Ambulation assist      Assist level: Minimal Assistance - Patient > 75%   Max distance: 150   Walk 10 feet activity   Assist     Assist level: Minimal Assistance - Patient > 75%     Walk 50 feet activity   Assist    Assist level: Minimal Assistance - Patient > 75%      Walk 150 feet activity   Assist    Assist level: Minimal Assistance - Patient > 75%      Walk 10 feet on uneven surface  activity   Assist  Assist level: Minimal Assistance - Patient > 75%     Wheelchair     Assist Is the patient using a wheelchair?: No Type of Wheelchair: Manual    Wheelchair assist level: Minimal Assistance - Patient > 75% Max wheelchair distance: 150    Wheelchair 50 feet with 2 turns activity    Assist        Assist Level: Minimal Assistance - Patient > 75%   Wheelchair 150 feet activity     Assist      Assist Level: Minimal Assistance - Patient > 75%   Blood pressure (!) 152/78, pulse 62, temperature 98 F (36.7 C), temperature source Oral, resp. rate 17, height 5\' 6"  (1.676 m), weight 83.1 kg, last menstrual period 04/07/2019, SpO2 97 %.  Medical Problem List and Plan: 1.  Left-sided weakness secondary to right MCA infarct due to right ICA occlusion status post tPA  complicated by small SAH with later right ICA stenting complicated by right femoral artery pseudoaneurysm with repair 08/15/2020 per vascular surgery             -patient may shower             -ELOS/Goals: 4 to 6 days/revision/mod I- team conf in am - medically ready for d/c on 8/24            cont CIR PT, OT  2.  Antithrombotics: -DVT/anticoagulation: Lovenox Mechanical: Sequential compression devices, entire leg Bilateral lower extremities Pharmaceutical: Lovenox             -antiplatelet therapy: Aspirin 81 mg daily and Brilinta 90 mg twice daily 3. Pain Management: Tramadol as needed 4. Mood: Paxil 10 mg daily             -antipsychotic agents: N/A 5. Neuropsych: This patient is capable of making decisions on her own behalf. 6. Skin/Wound Care: Routine skin checks 7. Fluids/Electrolytes/Nutrition: Routine in and outs, good intake for fluids             CMP unremarkable 8.  Hyperlipidemia: Lipitor 9.  GERD.  Protonix 10.  Hypothyroidism: Synthroid 11.  Hypokalemia             resolved 3.7 on 8/18    LOS: 6 days A FACE TO FACE EVALUATION WAS PERFORMED  9/18 08/28/2020, 7:54 AM

## 2020-08-28 NOTE — Plan of Care (Signed)
  Problem: RH Problem Solving Goal: LTG Patient will demonstrate problem solving for (SLP) Description: LTG:  Patient will demonstrate problem solving for basic/complex daily situations with cues  (SLP) Outcome: Completed/Met   Problem: RH Attention Goal: LTG Patient will demonstrate this level of attention during functional activites (SLP) Description: LTG:  Patient will will demonstrate this level of attention during functional activites (SLP) Outcome: Completed/Met   Problem: RH Awareness Goal: LTG: Patient will demonstrate awareness during functional activites type of (SLP) Description: LTG: Patient will demonstrate awareness during functional activites type of (SLP) Outcome: Completed/Met   

## 2020-08-28 NOTE — Progress Notes (Signed)
Occupational Therapy Discharge Summary  Patient Details  Name: Vicki Gonzales MRN: [id number] Date of Birth: Jul 23, 1965  Patient has met 48 of 13 long term goals due to improved activity tolerance, improved balance, postural control, ability to compensate for deficits, functional use of  LEFT upper extremity, improved attention, improved awareness, and improved coordination.  Patient to discharge at overall Modified Independent level.  Patient's care partner is independent to provide the necessary physical and cognitive assistance at discharge.    Reasons goals not met: NA  Recommendation:  Patient will benefit from ongoing skilled OT services in outpatient setting to continue to advance functional skills in the area of BADL, iADL, and Reduce care partner burden.  Equipment: No equipment provided  Reasons for discharge: treatment goals met and discharge from hospital  Patient/family agrees with progress made and goals achieved: Yes  OT Discharge Precautions/Restrictions  Precautions Precautions: Fall Precaution Comments: L inattention Restrictions Weight Bearing Restrictions: No  Pain Pain Assessment Pain Scale: 0-10 Pain Score: 0-No pain ADL ADL Eating: Independent Where Assessed-Eating: Edge of bed, Wheelchair Grooming: Modified independent Where Assessed-Grooming: Standing at sink, Sitting at sink Upper Body Bathing: Supervision/safety Where Assessed-Upper Body Bathing: Shower Lower Body Bathing: Supervision/safety Where Assessed-Lower Body Bathing: Shower Upper Body Dressing: Independent Where Assessed-Upper Body Dressing: Sitting at sink Lower Body Dressing: Modified independent Where Assessed-Lower Body Dressing: Edge of bed Toileting: Modified independent Where Assessed-Toileting: Glass blower/designer: Diplomatic Services operational officer Method: Counselling psychologist: Energy manager: Close supervison Electrical engineer Method: Optometrist: Facilities manager: Close supervision Social research officer, government Method: Heritage manager: Civil engineer, contracting with back, Grab bars Vision Baseline Vision/History: 0 No visual deficits Patient Visual Report: No change from baseline Vision Assessment?: Yes Eye Alignment: Within Functional Limits Ocular Range of Motion: Within Functional Limits Alignment/Gaze Preference: Within Defined Limits Tracking/Visual Pursuits: Able to track stimulus in all quads without difficulty Saccades: Within functional limits Convergence: Within functional limits Visual Fields: Left visual field deficit Perception  Perception: Impaired Inattention/Neglect: Does not attend to left visual field Praxis Praxis: Intact Cognition Overall Cognitive Status: Impaired/Different from baseline Arousal/Alertness: Awake/alert Orientation Level: Oriented X4 Year: 2022 Month: August Day of Week: Correct Memory: Appears intact Immediate Memory Recall: Sock;Blue;Bed Memory Recall Sock: Without Cue Memory Recall Blue: Without Cue Memory Recall Bed: Without Cue Safety/Judgment: Impaired Comments: Pt able to ind verbalize safety precautions, cont to req minimal cuing at times for safe rollater use. Sensation Sensation Light Touch: Impaired Detail Light Touch Impaired Details: Impaired LLE Hot/Cold: Appears Intact Proprioception: Impaired Detail Proprioception Impaired Details: Impaired LLE Stereognosis: Appears Intact Coordination Gross Motor Movements are Fluid and Coordinated: No Fine Motor Movements are Fluid and Coordinated: No Coordination and Movement Description: mild L hemi Finger Nose Finger Test: mild L dysmetria, greatly improved from eval Motor  Motor Motor: Hemiplegia Motor - Discharge Observations: mild L sided hemiplegia Mobility  Bed Mobility Bed Mobility: Supine to Sit Supine to Sit:  Independent Transfers Sit to Stand: Independent Stand to Sit: Independent with assistive device  Trunk/Postural Assessment  Cervical Assessment Cervical Assessment: Within Functional Limits Thoracic Assessment Thoracic Assessment: Exceptions to [facility] (mildly rounded shoulders) Lumbar Assessment Lumbar Assessment: Exceptions to [facility] (posterior pelvic tilt in sitting) Postural Control Postural Control: Deficits on evaluation (decreased with dynamic standing tasks)  Balance Balance Balance Assessed: Yes Standardized Balance Assessment Standardized Balance Assessment: Dynamic Gait Index;Berg Balance Test Dynamic Gait Index Steps: Mild Impairment Static Sitting Balance Static Sitting -  Level of Assistance: 7: Independent Dynamic Sitting Balance Dynamic Sitting - Balance Support: Feet supported;During functional activity Dynamic Sitting - Level of Assistance: 6: Modified independent (Device/Increase time) Static Standing Balance Static Standing - Balance Support: During functional activity;Bilateral upper extremity supported Static Standing - Level of Assistance: 6: Modified independent (Device/Increase time) Dynamic Standing Balance Dynamic Standing - Balance Support: During functional activity;Bilateral upper extremity supported Dynamic Standing - Level of Assistance: 6: Modified independent (Device/Increase time) Extremity/Trunk Assessment RUE Assessment RUE Assessment: Within Functional Limits LUE Assessment LUE Assessment: Exceptions to [facility] Active Range of Motion (AROM) Comments: 3/4 to full shoulder flexion General Strength Comments: 4-/5 in shoulder flexion LUE Body System: Neuro Brunstrum levels for arm and hand: Arm;Hand Brunstrum level for arm: Stage V Relative Independence from Synergy Brunstrum level for hand: Stage VI Isolated joint movements   Volanda Napoleon MS, OTR/L  08/28/2020, 12:39 PM

## 2020-08-28 NOTE — Plan of Care (Signed)
  Problem: Consults Goal: RH STROKE PATIENT EDUCATION Description: See Patient Education module for education specifics  Outcome: Progressing   Problem: RH BOWEL ELIMINATION Goal: RH STG MANAGE BOWEL WITH ASSISTANCE Description: STG Manage Bowel with mod I Assistance. Outcome: Progressing Goal: RH STG MANAGE BOWEL W/MEDICATION W/ASSISTANCE Description: STG Manage Bowel with Medication with mod I Assistance. Outcome: Progressing   Problem: RH SAFETY Goal: RH STG ADHERE TO SAFETY PRECAUTIONS W/ASSISTANCE/DEVICE Description: STG Adhere to Safety Precautions With cues/reminders Assistance/Device. Outcome: Progressing   Problem: RH PAIN MANAGEMENT Goal: RH STG PAIN MANAGED AT OR BELOW PT'S PAIN GOAL Description: At or below level 4 Outcome: Progressing   Problem: RH KNOWLEDGE DEFICIT Goal: RH STG INCREASE KNOWLEDGE OF DIABETES Description: Patient will be able to manage prediabetes with dietary modifications using handouts and educational tools independently Outcome: Progressing Goal: RH STG INCREASE KNOWLEDGE OF HYPERTENSION Description: Patient will be able to manage HTN with medications and dietary modifications using handouts and educational tools independently Outcome: Progressing Goal: RH STG INCREASE KNOWLEGDE OF HYPERLIPIDEMIA Description: Patient will be able to manageHLD with medications and dietary modifications using handouts and educational tools independently Outcome: Progressing Goal: RH STG INCREASE KNOWLEDGE OF STROKE PROPHYLAXIS Description: Patient will be able to manage secondary stroke risk with medications and dietary modifications using handouts and educational tools independently Outcome: Progressing   

## 2020-08-28 NOTE — Discharge Summary (Signed)
Physician Discharge Summary  Patient ID: Vicki Gonzales MRN: [id number] DOB/AGE: [birth year] 55 y.o.  Admit date: 08/22/2020 Discharge date: 08/29/2020  Discharge Diagnoses:  Principal Problem:   Right middle cerebral artery stroke [facility] 2) DVT prophylaxis Hyperlipidemia GERD Mood stabilization Hypothyroidism Tobacco use Obesity  Discharged Condition: Stable  Significant Diagnostic Studies: CT HEAD WO CONTRAST ( )  Result Date: 08/12/2020 CLINICAL DATA:  Cerebral hemorrhage suspected EXAM: CT HEAD WITHOUT CONTRAST TECHNIQUE: Contiguous axial images were obtained from the base of the skull through the vertex without intravenous contrast. COMPARISON:  None. FINDINGS: Brain: There is an early subacute infarct of the right frontal operculum and insula. Faint subarachnoid hemorrhage over the right frontal lobe is unchanged. There is no new site of hemorrhage. No midline shift or other mass effect. Vascular: No abnormal hyperdensity of the major intracranial arteries or dural venous sinuses. No intracranial atherosclerosis. Skull: The visualized skull base, calvarium and extracranial soft tissues are normal. Sinuses/Orbits: No fluid levels or advanced mucosal thickening of the visualized paranasal sinuses. No mastoid or middle ear effusion. The orbits are normal. IMPRESSION: 1. Unchanged appearance of early subacute infarct of the right frontal operculum and insula. 2. Unchanged faint subarachnoid hemorrhage over the right frontal lobe. 3. No new site of hemorrhage. Electronically Signed   By: Deatra Robinson M.D.   On: 08/12/2020 23:38   CT HEAD WO CONTRAST ( )  Result Date: 08/12/2020 CLINICAL DATA:  Follow-up subarachnoid hemorrhage EXAM: CT HEAD WITHOUT CONTRAST TECHNIQUE: Contiguous axial images were obtained from the base of the skull through the vertex without intravenous contrast. COMPARISON:  08/12/2020 FINDINGS: Brain: There appears to continue to be a small amount of subarachnoid  hemorrhage in the right frontal region, unchanged since prior study. No new areas of hemorrhage. No hydrocephalus or acute infarction. Vascular: No hyperdense vessel or unexpected calcification. Skull: No acute calvarial abnormality. Sinuses/Orbits: No acute findings Other: None IMPRESSION: Stable minimal subarachnoid hemorrhage in the right frontal region, unchanged. Electronically Signed   By: Charlett Nose M.D.   On: 08/12/2020 18:49   CT HEAD WO CONTRAST ( )  Result Date: 08/12/2020 CLINICAL DATA:  Cerebral hemorrhage suspected. Follow-up hemorrhage status post tPA. EXAM: CT HEAD WITHOUT CONTRAST TECHNIQUE: Contiguous axial images were obtained from the base of the skull through the vertex without intravenous contrast. COMPARISON:  Head CT and MRI earlier today. FINDINGS: Brain: Minimal right frontal subarachnoid hemorrhage is unchanged in extent from the earlier CT though is slightly less conspicuous (potentially related to different scanners). No new intracranial hemorrhage, extra-axial fluid collection, mass, or midline shift is identified. Cytotoxic edema related to an acute right MCA infarct in the insula and frontal operculum is unchanged. The ventricles are normal in size. Vascular: Residual intravascular contrast material. Skull: No fracture or suspicious osseous lesion. Sinuses/Orbits: Minimal mucosal thickening in the right maxillary sinus. Clear mastoid air cells. Unremarkable orbits. Other: None. IMPRESSION: 1. Unchanged trace subarachnoid hemorrhage. 2. Unchanged acute right MCA infarct. Electronically Signed   By: Sebastian Ache M.D.   On: 08/12/2020 15:56   CT HEAD WO CONTRAST ( )  Result Date: 08/12/2020 CLINICAL DATA:  Cerebral hemorrhage suspected. Headache following tPA. EXAM: CT HEAD WITHOUT CONTRAST TECHNIQUE: Contiguous axial images were obtained from the base of the skull through the vertex without intravenous contrast. COMPARISON:  Head CT earlier today FINDINGS: Brain: Hypodensity  in the right insula and right frontal operculum is similar in extent to today's earlier CT and consistent with an acute infarct. There is a small amount  of scattered curvilinear hyperdensity in the right frontal lobe which is indeterminate for trace subarachnoid hemorrhage versus cortical contrast staining or cortical petechial hemorrhage. The ventricles are normal in size. No mass, midline shift, or extra-axial fluid collection is identified. Vascular: Residual intravascular contrast from earlier CTA. Skull: No fracture or suspicious osseous lesion. Sinuses/Orbits: Visualized paranasal sinuses and mastoid air cells are clear. Unremarkable orbits. Other: None. IMPRESSION: 1. New small superficial densities in the right frontal lobe, indeterminate for trace subarachnoid hemorrhage versus cortical contrast staining or cortical petechial hemorrhage. 2. Unchanged extent of acute right MCA infarct involving the insula and frontal operculum. The study was discussed by telephone with Dr. Erick Blinks on 08/12/2020 at 2:15 p.m. Electronically Signed   By: Sebastian Ache M.D.   On: 08/12/2020 14:42   MR ANGIO HEAD WO CONTRAST  Result Date: 08/13/2020 CLINICAL DATA:  Neuro deficit, acute, stroke suspected. EXAM: MRI HEAD WITHOUT CONTRAST MRA HEAD WITHOUT CONTRAST TECHNIQUE: Multiplanar, multi-echo pulse sequences of the brain and surrounding structures were acquired without intravenous contrast. Angiographic images of the Circle of Willis were acquired using MRA technique without intravenous contrast. COMPARISON:  Head CT and head and neck CTA August 12, 2020. FINDINGS: MRI HEAD FINDINGS Brain: Area of restricted diffusion involving the right perirolandic region, corresponding to acute infarct, new from prior CT. Area of restricted diffusion involving the right frontal operculum and insula and focus of restricted diffusion in the right posterior temporal lobe, corresponding to acute/subacute infarcts, seen on prior CT.  Foci of susceptibility artifact are seen in the right frontal lobe, parietal lobe, insula and temporal lobe, within the areas of restricted diffusion, consistent with petechial hemorrhage. There is also susceptibility artifact in right frontal high convexity sulci, corresponding to subarachnoid hemorrhage seen on prior studies. Scattered foci of T2 hyperintensity are seen within the white matter of the cerebral hemispheres and within the pons, nonspecific, most likely related to chronic small vessel ischemia. Vascular: Normal flow voids. Skull and upper cervical spine: Normal marrow signal. Sinuses/Orbits: Trace mucosal thickening throughout the paranasal sinuses and small mucous retention cyst in the right maxillary sinus. The orbits are maintained. Other: None. MRA HEAD FINDINGS Anterior circulation: Absent flow related enhancement in the upper cervical and intracranial right internal carotid artery with reconstitution of flow at the ophthalmic segment with diminished caliber. Normal caliber and flow related enhancement of the upper cervical and intracranial left ICA. Normal flow related enhancement of the right M1/MCA segment with occlusion of a right M3/MCA anterior division branch. The bilateral ACA and left MCA vascular trees are preserved. Posterior circulation: Absent flow related enhancement in the proximal left vertebral artery with normal flow distal to the PICA origin. The dominant right vertebral artery has normal course and caliber. The basilar artery and bilateral posterior cerebral arteries are unremarkable. Anatomic variants: None significant. IMPRESSION: 1. Multifocal areas of right MCA territory infarct including a new area in the right perirolandic region when compared to prior CT. 2. Few small foci of susceptibility artifact within the areas of restricted diffusion corresponding to petechial hemorrhage. 3. Occlusion of the intracranial right ICA with reconstitution at the ophthalmic segment. 4.  Occlusion of a right M3/MCA anterior division branch. 5. Stable trace right subarachnoid hemorrhage. 6. Moderate chronic white matter disease. These results were called by telephone at the time of interpretation on 08/13/2020 at 1:17 pm to Dr. Marvel Plan , who verbally acknowledged these results. Electronically Signed   By: Baldemar Lenis M.D.   On:  08/13/2020 13:18   MR BRAIN WO CONTRAST  Result Date: 08/13/2020 CLINICAL DATA:  Neuro deficit, acute, stroke suspected. EXAM: MRI HEAD WITHOUT CONTRAST MRA HEAD WITHOUT CONTRAST TECHNIQUE: Multiplanar, multi-echo pulse sequences of the brain and surrounding structures were acquired without intravenous contrast. Angiographic images of the Circle of Willis were acquired using MRA technique without intravenous contrast. COMPARISON:  Head CT and head and neck CTA August 12, 2020. FINDINGS: MRI HEAD FINDINGS Brain: Area of restricted diffusion involving the right perirolandic region, corresponding to acute infarct, new from prior CT. Area of restricted diffusion involving the right frontal operculum and insula and focus of restricted diffusion in the right posterior temporal lobe, corresponding to acute/subacute infarcts, seen on prior CT. Foci of susceptibility artifact are seen in the right frontal lobe, parietal lobe, insula and temporal lobe, within the areas of restricted diffusion, consistent with petechial hemorrhage. There is also susceptibility artifact in right frontal high convexity sulci, corresponding to subarachnoid hemorrhage seen on prior studies. Scattered foci of T2 hyperintensity are seen within the white matter of the cerebral hemispheres and within the pons, nonspecific, most likely related to chronic small vessel ischemia. Vascular: Normal flow voids. Skull and upper cervical spine: Normal marrow signal. Sinuses/Orbits: Trace mucosal thickening throughout the paranasal sinuses and small mucous retention cyst in the right maxillary sinus.  The orbits are maintained. Other: None. MRA HEAD FINDINGS Anterior circulation: Absent flow related enhancement in the upper cervical and intracranial right internal carotid artery with reconstitution of flow at the ophthalmic segment with diminished caliber. Normal caliber and flow related enhancement of the upper cervical and intracranial left ICA. Normal flow related enhancement of the right M1/MCA segment with occlusion of a right M3/MCA anterior division branch. The bilateral ACA and left MCA vascular trees are preserved. Posterior circulation: Absent flow related enhancement in the proximal left vertebral artery with normal flow distal to the PICA origin. The dominant right vertebral artery has normal course and caliber. The basilar artery and bilateral posterior cerebral arteries are unremarkable. Anatomic variants: None significant. IMPRESSION: 1. Multifocal areas of right MCA territory infarct including a new area in the right perirolandic region when compared to prior CT. 2. Few small foci of susceptibility artifact within the areas of restricted diffusion corresponding to petechial hemorrhage. 3. Occlusion of the intracranial right ICA with reconstitution at the ophthalmic segment. 4. Occlusion of a right M3/MCA anterior division branch. 5. Stable trace right subarachnoid hemorrhage. 6. Moderate chronic white matter disease. These results were called by telephone at the time of interpretation on 08/13/2020 at 1:17 pm to Dr. Marvel Plan , who verbally acknowledged these results. Electronically Signed   By: Baldemar Lenis M.D.   On: 08/13/2020 13:18   MR BRAIN WO CONTRAST  Result Date: 08/12/2020 CLINICAL DATA:  Brain mass or lesion. Code stroke. Trace subarachnoid hemorrhage versus cortical contrast staining or cortical petechial hemorrhage on CT following tPA. EXAM: MRI HEAD WITHOUT CONTRAST TECHNIQUE: Multiplanar, multiecho pulse sequences of the brain and surrounding structures were  obtained without intravenous contrast. COMPARISON:  Head CT 08/12/2020 FINDINGS: A limited study consisting of only axial susceptibility weighted imaging was performed at the direction of the stroke neurologist. There is mild-to-moderate motion artifact, however curvilinear foci of susceptibility artifact are identified in several right frontal sulci most consistent with small volume acute subarachnoid hemorrhage when correlating with the preceding CT. There may also be minimal subarachnoid hemorrhage in the right parietal region. IMPRESSION: Positive for small volume subarachnoid hemorrhage. These  results were called by telephone at the time of interpretation on 08/12/2020 at 2:53 p.m. to Dr. Erick Blinks, who verbally acknowledged these results. Electronically Signed   By: Sebastian Ache M.D.   On: 08/12/2020 15:03   IR INTRAVSC STENT CERV CAROTID W/O EMB-PROT MOD SED  Result Date: 08/16/2020 CLINICAL DATA:  Right cerebral hemisphere ischemic stroke secondary to acute occlusion of the right internal carotid artery proximally with delayed string sign initially. EXAM: INTRACRANIAL STENT (INCL PTA) COMPARISON:  CT angiogram of the head and neck of August 12, 2020, and MRI MRA of the brain of August 13, 2020. MEDICATIONS: Heparin 3,500 units IV. Vancomycin antibiotic was administered within 1 hour of the procedure. ANESTHESIA/SEDATION: General anesthesia. CONTRAST:  Omnipaque 300 approximately 180 mL. FLUOROSCOPY TIME:  Fluoroscopy Time: 62 minutes 30 seconds (2136 mGy). COMPLICATIONS: None immediate. TECHNIQUE: Informed written consent was obtained from the patient after a thorough discussion of the procedural risks, benefits and alternatives. All questions were addressed. Maximal Sterile Barrier Technique was utilized including caps, mask, sterile gowns, sterile gloves, sterile drape, hand hygiene and skin antiseptic. A timeout was performed prior to the initiation of the procedure. The right groin was prepped  and draped in the usual sterile fashion. Thereafter using modified Seldinger technique, transfemoral access into the right common femoral artery was obtained without difficulty. Over a 0.035 inch guidewire, an 8 [place] Pinnacle 25 cm sheath was inserted. Through this, and also over 0.035 inch guidewire, a 5 [place] JB 1 catheter was advanced to the aortic arch region and selectively positioned in the right common carotid artery, the right vertebral artery, the left common carotid artery and the left vertebral artery. FINDINGS: The left subclavian arteriogram demonstrates nonvisualization of the left vertebral artery. No distal reconstitution of the vessel is seen from branches arising from the ascending cervical branch of the thyrocervical trunk. Left common carotid arteriogram demonstrates the left external carotid artery and its major branches to be widely patent. The left internal carotid artery at the bulb to the cranial skull base is widely patent. The petrous, cavernous, and supraclinoid segments are patent as well. The left middle cerebral artery and the left anterior cerebral artery opacify into the capillary and venous phases. Prompt cross-filling via the anterior communicating artery of the right anterior cerebral A1 segment, and partial right middle cerebral artery distribution is noted with evidence of occlusion of the anterior perisylvian branches of the superior division of the right middle cerebral artery. The right vertebral artery origin is widely patent. The vessel is seen to opacify to the cranial skull base to the right posterior-inferior cerebellar artery and the right vertebrobasilar junction. The basilar artery, the posterior cerebral arteries, the superior cerebellar arteries and the anterior-inferior cerebellar arteries opacify into the capillary and venous phases. Collaterals arising from the right PCA P3 segment are seen to supply the right posterior parietal, cortical and subcortical  region. Also demonstrated is retrograde opacification of the left vertebrobasilar junction to the left posterior-inferior cerebellar artery. No evidence of antegrade flow from the proximal left vertebral artery is seen. Right common carotid arteriogram demonstrates the right external carotid artery and its major branches to be widely patent. Partial reconstitution of the right internal carotid artery at the cavernous segment is seen via the ethmoidal collaterals retrogradely opacifying the right ophthalmic artery. Delayed DSA demonstrates retrograde opacification of the right internal carotid artery to the cervical petrous junction. Partial opacification is seen of the right middle cerebral artery proximally in its branches. Unopacified blood  is seen from the contralateral left internal carotid artery via the anterior communicating artery. ENDOVASCULAR REVASCULARIZATION OF OCCLUDED RIGHT INTERNAL CAROTID ARTERY PROXIMALLY Over a 0.035 inch 300 cm Rosen exchange guidewire, an 087 Walrus balloon guide catheter which was prepped with 50% contrast and 50% heparinized saline infusion was advanced and positioned just proximal to the right common carotid bifurcation. The guidewire was removed. Good aspiration obtained from the hub of the balloon guide catheter. A control arteriogram performed through the balloon guide catheter demonstrated near complete occlusion of the right internal carotid artery distally with a proximal very faint string sign. Over an 014 inch standard Synchro micro guidewire with a moderate J configuration an 021 150 cm microcatheter was advanced distal to the balloon guide catheter. Using a torque device, multiple attempts were made to advance the micro guidewire through the severely narrowed paper thin string sign without success. Multiple attempts were then made using a different 0.014 inch Aristotle micro guidewire without success. A 125 cm 4 French slip catheter was then advanced with the balloon  guide catheter and positioned pointing into the occluded right internal carotid artery. Through this an 035 inch Roadrunner guidewire was then gently advanced with minimal resistance through the occluded proximal right internal carotid artery into the distal cervical petrous junction. The 4 French slip catheter was advanced to the mid cervical right ICA. Good aspiration of blood was obtained from the hub of the slip catheter. Very gentle contrast injection demonstrated antegrade flow into the distal right internal carotid artery. The 4 French slip catheter was then exchanged for an 014 inch 300 cm Zoom exchange micro guidewire with a moderate J-tip configuration. The distal end of the exchange micro guidewire was maintained in the horizontal petrous segment. A gentle control arteriogram performed through the balloon guide catheter demonstrated antegrade flow through the proximal right internal carotid artery more distally. A 15 mm smooth filling defect was now evident at the proximal right internal carotid artery. A 4 mm x 30 mm Viatrac 14 angioplasty balloon catheter was then prepped antegradely and retrogradely and then advanced using the rapid exchange technique to the distal end of the balloon guide catheter. Distal and proximal markers were positioned adequate distant from the severe ICA stenosis. A control angioplasty was then performed using micro inflation syringe device via micro tubing with balloon being expanded to just over 8 atmospheres where it was maintained for approximately 30 seconds. The balloon was then deflated and retrieved and removed. Control arteriogram performed through the guide catheter now demonstrated significant improved caliber and flow through the angioplastied segment and distally into the right internal carotid artery extra cranially and intracranially. The previously known occluded to anterior perisylvian branches of the superior division demonstrated string like flow in the M2 M3  region. Also noted was an occluded distal M3 branch of a parietal branch of the inferior division of the left middle cerebral artery. Following the angioplasty the proximal right internal carotid artery demonstrated smooth contour of the angioplastied segment most likely representing the phased atherosclerotic plaques following the angioplasty. No true intraluminal filling defects were seen. Measurements were then performed of the left internal carotid artery in its most normal segment distally, and proximally the left common carotid artery. It was decided to proceed with placement of a 6/8 mm x 40 mm Xact stent. The stent delivery apparatus was retrogradely purged with heparinized saline infusion. Thereafter using the rapid exchange technique, the stent delivery system was then advanced without difficulty and positioned adequate distance  from the site of the angioplastied segment of the right internal carotid proximally. The stent was deployed in the usual manner without difficulty. Proximal flow arrest was initiated by inflating the balloon at the distal end of the balloon guide catheter prior to the angioplasty, and at the time of the advancement of the stent with aspiration at the hub of the balloon guide catheter. Following the placement of the stent, balloon was deflated and aspiration discontinued. With the exchange distal micro guidewire and in the horizontal petrous segment, delivery apparatus was retrieved and removed. A control arteriogram performed through the guide catheter now demonstrated excellent apposition and improved flow through the previously occluded right internal carotid proximally and distally intracranially. Again no evidence of new intraluminal filling defects or occlusion was seen intracranially. Significant spasm was seen just distal to the distal part of the stent. At least 4 aliquots of 25 mcg of nitroglycerin were then infused in divided doses with only temporary relief of the spasm.  This also demonstrated caliber irregularity of the mid right internal carotid artery. Distal to this the artery remained smooth in caliber. Patient was also given 2.5 mg of verapamil intra-arterially with only partial temporary relief of the spasm. It was, therefore, decided to proceed with placement of a 4.5 mm x 30 mm Neuroform Atlas stent telescoping into the distal segment of the first stent. Over the exchange micro guidewire, an 021 microcatheter was advanced into the horizontal petrous segment. The guidewire was removed. Good aspiration obtained from the hub of the microcatheter. Gentle control arteriogram performed through microcatheter demonstrated safe position of the tip of the microcatheter. This was then connected to continuous heparinized saline infusion. The 4.5 mm x 30 mm Neuroform Atlas stent was then advanced to the distal end of the microcatheter. The entire system was then retrieved more proximally such that the proximal portion of the Neuroform stent was just telescoping into the distal portion of the initial stent. Thereafter, the O ring on the delivery micro guidewire was loosened. With slight forward gentle traction with the right hand on the delivery micro guidewire, with the left hand the distal, and then the proximal portion of the stent were deployed with the proximal end telescoping just inside of the first stent. The delivery micro guidewire was removed with the microcatheter. Gentle control arteriogram performed through the balloon guide catheter in the right common carotid artery now demonstrated excellent flow throughout the entirety of the right internal carotid artery intracranially and extracranially with no change angiographically. 4.5 mg of intra-arterial Integrilin were also infused in order to prevent platelet aggregation within the stents. None was observed. A final control arteriogram performed through the balloon guide catheter demonstrated continued excellent apposition  with inflow in caliber through the right internal carotid artery extra cranially and intracranially with no change in the right MCA distribution. The right anterior cerebral artery remained widely patent. Throughout the procedure, the patient's blood pressure and neurological status remained stable. Patient's ACT was maintained in the region of approximately 218 seconds. Balloon guide catheter was then retrieved and removed. The 8 French Pinnacle sheath was removed with the application of a 7 [place] ExoSeal closure device for hemostasis. Distal pulses remained palpable in both feet unchanged. A CT performed at the end of the procedure demonstrated no evidence of intracranial hemorrhage or mass effect. Patient's general anesthesia was then reversed and the patient was then extubated. Upon recovery, the patient was able to obey simple instructions without difficulty. She was able to bend  her left knee against gravity as well as her left hand against gravity. She was then transferred to PACU and then neuro ICU for overnight observations with strict management of the patient's blood pressure. Overnight, the patient had a 9/10 supraorbital headache on the right. This responded to Tylenol. Otherwise, neurologically, the patient had returned to her preprocedural NIH stroke score. The following morning, the patient was more alert, awake and appropriate. Her left upper and lower extremities had gotten to approximately 4/5 strength with minimal decreased to light touch in the left upper and lower extremities. The right groin appeared soft. However, ecchymosis was seen extending more laterally with focal tenderness just at the site of the quick clot. An ultrasound of the groin was requested to rule out the possibility of a small pseudoaneurysm. IMPRESSION: Status post endovascular complete revascularization of acutely occluded right internal carotid proximally with stent assisted angioplasty with proximal flow arrest.  Placement of a telescoping stent in the mid cervical right ICA due to persistent vasospasm. PLAN: Follow-up in the clinic 2-4 weeks post discharge. Electronically Signed   By: Julieanne Cotton M.D.   On: 08/15/2020 14:07   IR Intra Cran Stent  Result Date: 08/16/2020 CLINICAL DATA:  Right cerebral hemisphere ischemic stroke secondary to acute occlusion of the right internal carotid artery proximally with delayed string sign initially. EXAM: INTRACRANIAL STENT (INCL PTA) COMPARISON:  CT angiogram of the head and neck of August 12, 2020, and MRI MRA of the brain of August 13, 2020. MEDICATIONS: Heparin 3,500 units IV. Vancomycin antibiotic was administered within 1 hour of the procedure. ANESTHESIA/SEDATION: General anesthesia. CONTRAST:  Omnipaque 300 approximately 180 mL. FLUOROSCOPY TIME:  Fluoroscopy Time: 62 minutes 30 seconds (2136 mGy). COMPLICATIONS: None immediate. TECHNIQUE: Informed written consent was obtained from the patient after a thorough discussion of the procedural risks, benefits and alternatives. All questions were addressed. Maximal Sterile Barrier Technique was utilized including caps, mask, sterile gowns, sterile gloves, sterile drape, hand hygiene and skin antiseptic. A timeout was performed prior to the initiation of the procedure. The right groin was prepped and draped in the usual sterile fashion. Thereafter using modified Seldinger technique, transfemoral access into the right common femoral artery was obtained without difficulty. Over a 0.035 inch guidewire, an 8 [place] Pinnacle 25 cm sheath was inserted. Through this, and also over 0.035 inch guidewire, a 5 [place] JB 1 catheter was advanced to the aortic arch region and selectively positioned in the right common carotid artery, the right vertebral artery, the left common carotid artery and the left vertebral artery. FINDINGS: The left subclavian arteriogram demonstrates nonvisualization of the left vertebral artery. No distal  reconstitution of the vessel is seen from branches arising from the ascending cervical branch of the thyrocervical trunk. Left common carotid arteriogram demonstrates the left external carotid artery and its major branches to be widely patent. The left internal carotid artery at the bulb to the cranial skull base is widely patent. The petrous, cavernous, and supraclinoid segments are patent as well. The left middle cerebral artery and the left anterior cerebral artery opacify into the capillary and venous phases. Prompt cross-filling via the anterior communicating artery of the right anterior cerebral A1 segment, and partial right middle cerebral artery distribution is noted with evidence of occlusion of the anterior perisylvian branches of the superior division of the right middle cerebral artery. The right vertebral artery origin is widely patent. The vessel is seen to opacify to the cranial skull base to the right posterior-inferior cerebellar  artery and the right vertebrobasilar junction. The basilar artery, the posterior cerebral arteries, the superior cerebellar arteries and the anterior-inferior cerebellar arteries opacify into the capillary and venous phases. Collaterals arising from the right PCA P3 segment are seen to supply the right posterior parietal, cortical and subcortical region. Also demonstrated is retrograde opacification of the left vertebrobasilar junction to the left posterior-inferior cerebellar artery. No evidence of antegrade flow from the proximal left vertebral artery is seen. Right common carotid arteriogram demonstrates the right external carotid artery and its major branches to be widely patent. Partial reconstitution of the right internal carotid artery at the cavernous segment is seen via the ethmoidal collaterals retrogradely opacifying the right ophthalmic artery. Delayed DSA demonstrates retrograde opacification of the right internal carotid artery to the cervical petrous  junction. Partial opacification is seen of the right middle cerebral artery proximally in its branches. Unopacified blood is seen from the contralateral left internal carotid artery via the anterior communicating artery. ENDOVASCULAR REVASCULARIZATION OF OCCLUDED RIGHT INTERNAL CAROTID ARTERY PROXIMALLY Over a 0.035 inch 300 cm Rosen exchange guidewire, an 087 Walrus balloon guide catheter which was prepped with 50% contrast and 50% heparinized saline infusion was advanced and positioned just proximal to the right common carotid bifurcation. The guidewire was removed. Good aspiration obtained from the hub of the balloon guide catheter. A control arteriogram performed through the balloon guide catheter demonstrated near complete occlusion of the right internal carotid artery distally with a proximal very faint string sign. Over an 014 inch standard Synchro micro guidewire with a moderate J configuration an 021 150 cm microcatheter was advanced distal to the balloon guide catheter. Using a torque device, multiple attempts were made to advance the micro guidewire through the severely narrowed paper thin string sign without success. Multiple attempts were then made using a different 0.014 inch Aristotle micro guidewire without success. A 125 cm 4 French slip catheter was then advanced with the balloon guide catheter and positioned pointing into the occluded right internal carotid artery. Through this an 035 inch Roadrunner guidewire was then gently advanced with minimal resistance through the occluded proximal right internal carotid artery into the distal cervical petrous junction. The 4 French slip catheter was advanced to the mid cervical right ICA. Good aspiration of blood was obtained from the hub of the slip catheter. Very gentle contrast injection demonstrated antegrade flow into the distal right internal carotid artery. The 4 French slip catheter was then exchanged for an 014 inch 300 cm Zoom exchange micro  guidewire with a moderate J-tip configuration. The distal end of the exchange micro guidewire was maintained in the horizontal petrous segment. A gentle control arteriogram performed through the balloon guide catheter demonstrated antegrade flow through the proximal right internal carotid artery more distally. A 15 mm smooth filling defect was now evident at the proximal right internal carotid artery. A 4 mm x 30 mm Viatrac 14 angioplasty balloon catheter was then prepped antegradely and retrogradely and then advanced using the rapid exchange technique to the distal end of the balloon guide catheter. Distal and proximal markers were positioned adequate distant from the severe ICA stenosis. A control angioplasty was then performed using micro inflation syringe device via micro tubing with balloon being expanded to just over 8 atmospheres where it was maintained for approximately 30 seconds. The balloon was then deflated and retrieved and removed. Control arteriogram performed through the guide catheter now demonstrated significant improved caliber and flow through the angioplastied segment and distally into the right  internal carotid artery extra cranially and intracranially. The previously known occluded to anterior perisylvian branches of the superior division demonstrated string like flow in the M2 M3 region. Also noted was an occluded distal M3 branch of a parietal branch of the inferior division of the left middle cerebral artery. Following the angioplasty the proximal right internal carotid artery demonstrated smooth contour of the angioplastied segment most likely representing the phased atherosclerotic plaques following the angioplasty. No true intraluminal filling defects were seen. Measurements were then performed of the left internal carotid artery in its most normal segment distally, and proximally the left common carotid artery. It was decided to proceed with placement of a 6/8 mm x 40 mm Xact stent. The  stent delivery apparatus was retrogradely purged with heparinized saline infusion. Thereafter using the rapid exchange technique, the stent delivery system was then advanced without difficulty and positioned adequate distance from the site of the angioplastied segment of the right internal carotid proximally. The stent was deployed in the usual manner without difficulty. Proximal flow arrest was initiated by inflating the balloon at the distal end of the balloon guide catheter prior to the angioplasty, and at the time of the advancement of the stent with aspiration at the hub of the balloon guide catheter. Following the placement of the stent, balloon was deflated and aspiration discontinued. With the exchange distal micro guidewire and in the horizontal petrous segment, delivery apparatus was retrieved and removed. A control arteriogram performed through the guide catheter now demonstrated excellent apposition and improved flow through the previously occluded right internal carotid proximally and distally intracranially. Again no evidence of new intraluminal filling defects or occlusion was seen intracranially. Significant spasm was seen just distal to the distal part of the stent. At least 4 aliquots of 25 mcg of nitroglycerin were then infused in divided doses with only temporary relief of the spasm. This also demonstrated caliber irregularity of the mid right internal carotid artery. Distal to this the artery remained smooth in caliber. Patient was also given 2.5 mg of verapamil intra-arterially with only partial temporary relief of the spasm. It was, therefore, decided to proceed with placement of a 4.5 mm x 30 mm Neuroform Atlas stent telescoping into the distal segment of the first stent. Over the exchange micro guidewire, an 021 microcatheter was advanced into the horizontal petrous segment. The guidewire was removed. Good aspiration obtained from the hub of the microcatheter. Gentle control arteriogram  performed through microcatheter demonstrated safe position of the tip of the microcatheter. This was then connected to continuous heparinized saline infusion. The 4.5 mm x 30 mm Neuroform Atlas stent was then advanced to the distal end of the microcatheter. The entire system was then retrieved more proximally such that the proximal portion of the Neuroform stent was just telescoping into the distal portion of the initial stent. Thereafter, the O ring on the delivery micro guidewire was loosened. With slight forward gentle traction with the right hand on the delivery micro guidewire, with the left hand the distal, and then the proximal portion of the stent were deployed with the proximal end telescoping just inside of the first stent. The delivery micro guidewire was removed with the microcatheter. Gentle control arteriogram performed through the balloon guide catheter in the right common carotid artery now demonstrated excellent flow throughout the entirety of the right internal carotid artery intracranially and extracranially with no change angiographically. 4.5 mg of intra-arterial Integrilin were also infused in order to prevent platelet aggregation within the stents.  None was observed. A final control arteriogram performed through the balloon guide catheter demonstrated continued excellent apposition with inflow in caliber through the right internal carotid artery extra cranially and intracranially with no change in the right MCA distribution. The right anterior cerebral artery remained widely patent. Throughout the procedure, the patient's blood pressure and neurological status remained stable. Patient's ACT was maintained in the region of approximately 218 seconds. Balloon guide catheter was then retrieved and removed. The 8 French Pinnacle sheath was removed with the application of a 7 [place] ExoSeal closure device for hemostasis. Distal pulses remained palpable in both feet unchanged. A CT performed at the  end of the procedure demonstrated no evidence of intracranial hemorrhage or mass effect. Patient's general anesthesia was then reversed and the patient was then extubated. Upon recovery, the patient was able to obey simple instructions without difficulty. She was able to bend her left knee against gravity as well as her left hand against gravity. She was then transferred to PACU and then neuro ICU for overnight observations with strict management of the patient's blood pressure. Overnight, the patient had a 9/10 supraorbital headache on the right. This responded to Tylenol. Otherwise, neurologically, the patient had returned to her preprocedural NIH stroke score. The following morning, the patient was more alert, awake and appropriate. Her left upper and lower extremities had gotten to approximately 4/5 strength with minimal decreased to light touch in the left upper and lower extremities. The right groin appeared soft. However, ecchymosis was seen extending more laterally with focal tenderness just at the site of the quick clot. An ultrasound of the groin was requested to rule out the possibility of a small pseudoaneurysm. IMPRESSION: Status post endovascular complete revascularization of acutely occluded right internal carotid proximally with stent assisted angioplasty with proximal flow arrest. Placement of a telescoping stent in the mid cervical right ICA due to persistent vasospasm. PLAN: Follow-up in the clinic 2-4 weeks post discharge. Electronically Signed   By: Julieanne Cotton M.D.   On: 08/15/2020 14:07   IR CT Head Ltd  Result Date: 08/16/2020 CLINICAL DATA:  Right cerebral hemisphere ischemic stroke secondary to acute occlusion of the right internal carotid artery proximally with delayed string sign initially. EXAM: INTRACRANIAL STENT (INCL PTA) COMPARISON:  CT angiogram of the head and neck of August 12, 2020, and MRI MRA of the brain of August 13, 2020. MEDICATIONS: Heparin 3,500 units IV.  Vancomycin antibiotic was administered within 1 hour of the procedure. ANESTHESIA/SEDATION: General anesthesia. CONTRAST:  Omnipaque 300 approximately 180 mL. FLUOROSCOPY TIME:  Fluoroscopy Time: 62 minutes 30 seconds (2136 mGy). COMPLICATIONS: None immediate. TECHNIQUE: Informed written consent was obtained from the patient after a thorough discussion of the procedural risks, benefits and alternatives. All questions were addressed. Maximal Sterile Barrier Technique was utilized including caps, mask, sterile gowns, sterile gloves, sterile drape, hand hygiene and skin antiseptic. A timeout was performed prior to the initiation of the procedure. The right groin was prepped and draped in the usual sterile fashion. Thereafter using modified Seldinger technique, transfemoral access into the right common femoral artery was obtained without difficulty. Over a 0.035 inch guidewire, an 8 [place] Pinnacle 25 cm sheath was inserted. Through this, and also over 0.035 inch guidewire, a 5 [place] JB 1 catheter was advanced to the aortic arch region and selectively positioned in the right common carotid artery, the right vertebral artery, the left common carotid artery and the left vertebral artery. FINDINGS: The left subclavian arteriogram demonstrates nonvisualization of  the left vertebral artery. No distal reconstitution of the vessel is seen from branches arising from the ascending cervical branch of the thyrocervical trunk. Left common carotid arteriogram demonstrates the left external carotid artery and its major branches to be widely patent. The left internal carotid artery at the bulb to the cranial skull base is widely patent. The petrous, cavernous, and supraclinoid segments are patent as well. The left middle cerebral artery and the left anterior cerebral artery opacify into the capillary and venous phases. Prompt cross-filling via the anterior communicating artery of the right anterior cerebral A1 segment, and partial  right middle cerebral artery distribution is noted with evidence of occlusion of the anterior perisylvian branches of the superior division of the right middle cerebral artery. The right vertebral artery origin is widely patent. The vessel is seen to opacify to the cranial skull base to the right posterior-inferior cerebellar artery and the right vertebrobasilar junction. The basilar artery, the posterior cerebral arteries, the superior cerebellar arteries and the anterior-inferior cerebellar arteries opacify into the capillary and venous phases. Collaterals arising from the right PCA P3 segment are seen to supply the right posterior parietal, cortical and subcortical region. Also demonstrated is retrograde opacification of the left vertebrobasilar junction to the left posterior-inferior cerebellar artery. No evidence of antegrade flow from the proximal left vertebral artery is seen. Right common carotid arteriogram demonstrates the right external carotid artery and its major branches to be widely patent. Partial reconstitution of the right internal carotid artery at the cavernous segment is seen via the ethmoidal collaterals retrogradely opacifying the right ophthalmic artery. Delayed DSA demonstrates retrograde opacification of the right internal carotid artery to the cervical petrous junction. Partial opacification is seen of the right middle cerebral artery proximally in its branches. Unopacified blood is seen from the contralateral left internal carotid artery via the anterior communicating artery. ENDOVASCULAR REVASCULARIZATION OF OCCLUDED RIGHT INTERNAL CAROTID ARTERY PROXIMALLY Over a 0.035 inch 300 cm Rosen exchange guidewire, an 087 Walrus balloon guide catheter which was prepped with 50% contrast and 50% heparinized saline infusion was advanced and positioned just proximal to the right common carotid bifurcation. The guidewire was removed. Good aspiration obtained from the hub of the balloon guide  catheter. A control arteriogram performed through the balloon guide catheter demonstrated near complete occlusion of the right internal carotid artery distally with a proximal very faint string sign. Over an 014 inch standard Synchro micro guidewire with a moderate J configuration an 021 150 cm microcatheter was advanced distal to the balloon guide catheter. Using a torque device, multiple attempts were made to advance the micro guidewire through the severely narrowed paper thin string sign without success. Multiple attempts were then made using a different 0.014 inch Aristotle micro guidewire without success. A 125 cm 4 French slip catheter was then advanced with the balloon guide catheter and positioned pointing into the occluded right internal carotid artery. Through this an 035 inch Roadrunner guidewire was then gently advanced with minimal resistance through the occluded proximal right internal carotid artery into the distal cervical petrous junction. The 4 French slip catheter was advanced to the mid cervical right ICA. Good aspiration of blood was obtained from the hub of the slip catheter. Very gentle contrast injection demonstrated antegrade flow into the distal right internal carotid artery. The 4 French slip catheter was then exchanged for an 014 inch 300 cm Zoom exchange micro guidewire with a moderate J-tip configuration. The distal end of the exchange micro guidewire was maintained in  the horizontal petrous segment. A gentle control arteriogram performed through the balloon guide catheter demonstrated antegrade flow through the proximal right internal carotid artery more distally. A 15 mm smooth filling defect was now evident at the proximal right internal carotid artery. A 4 mm x 30 mm Viatrac 14 angioplasty balloon catheter was then prepped antegradely and retrogradely and then advanced using the rapid exchange technique to the distal end of the balloon guide catheter. Distal and proximal markers  were positioned adequate distant from the severe ICA stenosis. A control angioplasty was then performed using micro inflation syringe device via micro tubing with balloon being expanded to just over 8 atmospheres where it was maintained for approximately 30 seconds. The balloon was then deflated and retrieved and removed. Control arteriogram performed through the guide catheter now demonstrated significant improved caliber and flow through the angioplastied segment and distally into the right internal carotid artery extra cranially and intracranially. The previously known occluded to anterior perisylvian branches of the superior division demonstrated string like flow in the M2 M3 region. Also noted was an occluded distal M3 branch of a parietal branch of the inferior division of the left middle cerebral artery. Following the angioplasty the proximal right internal carotid artery demonstrated smooth contour of the angioplastied segment most likely representing the phased atherosclerotic plaques following the angioplasty. No true intraluminal filling defects were seen. Measurements were then performed of the left internal carotid artery in its most normal segment distally, and proximally the left common carotid artery. It was decided to proceed with placement of a 6/8 mm x 40 mm Xact stent. The stent delivery apparatus was retrogradely purged with heparinized saline infusion. Thereafter using the rapid exchange technique, the stent delivery system was then advanced without difficulty and positioned adequate distance from the site of the angioplastied segment of the right internal carotid proximally. The stent was deployed in the usual manner without difficulty. Proximal flow arrest was initiated by inflating the balloon at the distal end of the balloon guide catheter prior to the angioplasty, and at the time of the advancement of the stent with aspiration at the hub of the balloon guide catheter. Following the  placement of the stent, balloon was deflated and aspiration discontinued. With the exchange distal micro guidewire and in the horizontal petrous segment, delivery apparatus was retrieved and removed. A control arteriogram performed through the guide catheter now demonstrated excellent apposition and improved flow through the previously occluded right internal carotid proximally and distally intracranially. Again no evidence of new intraluminal filling defects or occlusion was seen intracranially. Significant spasm was seen just distal to the distal part of the stent. At least 4 aliquots of 25 mcg of nitroglycerin were then infused in divided doses with only temporary relief of the spasm. This also demonstrated caliber irregularity of the mid right internal carotid artery. Distal to this the artery remained smooth in caliber. Patient was also given 2.5 mg of verapamil intra-arterially with only partial temporary relief of the spasm. It was, therefore, decided to proceed with placement of a 4.5 mm x 30 mm Neuroform Atlas stent telescoping into the distal segment of the first stent. Over the exchange micro guidewire, an 021 microcatheter was advanced into the horizontal petrous segment. The guidewire was removed. Good aspiration obtained from the hub of the microcatheter. Gentle control arteriogram performed through microcatheter demonstrated safe position of the tip of the microcatheter. This was then connected to continuous heparinized saline infusion. The 4.5 mm x 30 mm Neuroform Atlas  stent was then advanced to the distal end of the microcatheter. The entire system was then retrieved more proximally such that the proximal portion of the Neuroform stent was just telescoping into the distal portion of the initial stent. Thereafter, the O ring on the delivery micro guidewire was loosened. With slight forward gentle traction with the right hand on the delivery micro guidewire, with the left hand the distal, and then  the proximal portion of the stent were deployed with the proximal end telescoping just inside of the first stent. The delivery micro guidewire was removed with the microcatheter. Gentle control arteriogram performed through the balloon guide catheter in the right common carotid artery now demonstrated excellent flow throughout the entirety of the right internal carotid artery intracranially and extracranially with no change angiographically. 4.5 mg of intra-arterial Integrilin were also infused in order to prevent platelet aggregation within the stents. None was observed. A final control arteriogram performed through the balloon guide catheter demonstrated continued excellent apposition with inflow in caliber through the right internal carotid artery extra cranially and intracranially with no change in the right MCA distribution. The right anterior cerebral artery remained widely patent. Throughout the procedure, the patient's blood pressure and neurological status remained stable. Patient's ACT was maintained in the region of approximately 218 seconds. Balloon guide catheter was then retrieved and removed. The 8 French Pinnacle sheath was removed with the application of a 7 [place] ExoSeal closure device for hemostasis. Distal pulses remained palpable in both feet unchanged. A CT performed at the end of the procedure demonstrated no evidence of intracranial hemorrhage or mass effect. Patient's general anesthesia was then reversed and the patient was then extubated. Upon recovery, the patient was able to obey simple instructions without difficulty. She was able to bend her left knee against gravity as well as her left hand against gravity. She was then transferred to PACU and then neuro ICU for overnight observations with strict management of the patient's blood pressure. Overnight, the patient had a 9/10 supraorbital headache on the right. This responded to Tylenol. Otherwise, neurologically, the patient had  returned to her preprocedural NIH stroke score. The following morning, the patient was more alert, awake and appropriate. Her left upper and lower extremities had gotten to approximately 4/5 strength with minimal decreased to light touch in the left upper and lower extremities. The right groin appeared soft. However, ecchymosis was seen extending more laterally with focal tenderness just at the site of the quick clot. An ultrasound of the groin was requested to rule out the possibility of a small pseudoaneurysm. IMPRESSION: Status post endovascular complete revascularization of acutely occluded right internal carotid proximally with stent assisted angioplasty with proximal flow arrest. Placement of a telescoping stent in the mid cervical right ICA due to persistent vasospasm. PLAN: Follow-up in the clinic 2-4 weeks post discharge. Electronically Signed   By: Julieanne Cotton M.D.   On: 08/15/2020 14:07   IR CT Head Ltd  Result Date: 08/16/2020 CLINICAL DATA:  Right cerebral hemisphere ischemic stroke secondary to acute occlusion of the right internal carotid artery proximally with delayed string sign initially. EXAM: INTRACRANIAL STENT (INCL PTA) COMPARISON:  CT angiogram of the head and neck of August 12, 2020, and MRI MRA of the brain of August 13, 2020. MEDICATIONS: Heparin 3,500 units IV. Vancomycin antibiotic was administered within 1 hour of the procedure. ANESTHESIA/SEDATION: General anesthesia. CONTRAST:  Omnipaque 300 approximately 180 mL. FLUOROSCOPY TIME:  Fluoroscopy Time: 62 minutes 30 seconds (2136 mGy). COMPLICATIONS:  None immediate. TECHNIQUE: Informed written consent was obtained from the patient after a thorough discussion of the procedural risks, benefits and alternatives. All questions were addressed. Maximal Sterile Barrier Technique was utilized including caps, mask, sterile gowns, sterile gloves, sterile drape, hand hygiene and skin antiseptic. A timeout was performed prior to the  initiation of the procedure. The right groin was prepped and draped in the usual sterile fashion. Thereafter using modified Seldinger technique, transfemoral access into the right common femoral artery was obtained without difficulty. Over a 0.035 inch guidewire, an 8 [place] Pinnacle 25 cm sheath was inserted. Through this, and also over 0.035 inch guidewire, a 5 [place] JB 1 catheter was advanced to the aortic arch region and selectively positioned in the right common carotid artery, the right vertebral artery, the left common carotid artery and the left vertebral artery. FINDINGS: The left subclavian arteriogram demonstrates nonvisualization of the left vertebral artery. No distal reconstitution of the vessel is seen from branches arising from the ascending cervical branch of the thyrocervical trunk. Left common carotid arteriogram demonstrates the left external carotid artery and its major branches to be widely patent. The left internal carotid artery at the bulb to the cranial skull base is widely patent. The petrous, cavernous, and supraclinoid segments are patent as well. The left middle cerebral artery and the left anterior cerebral artery opacify into the capillary and venous phases. Prompt cross-filling via the anterior communicating artery of the right anterior cerebral A1 segment, and partial right middle cerebral artery distribution is noted with evidence of occlusion of the anterior perisylvian branches of the superior division of the right middle cerebral artery. The right vertebral artery origin is widely patent. The vessel is seen to opacify to the cranial skull base to the right posterior-inferior cerebellar artery and the right vertebrobasilar junction. The basilar artery, the posterior cerebral arteries, the superior cerebellar arteries and the anterior-inferior cerebellar arteries opacify into the capillary and venous phases. Collaterals arising from the right PCA P3 segment are seen to supply the  right posterior parietal, cortical and subcortical region. Also demonstrated is retrograde opacification of the left vertebrobasilar junction to the left posterior-inferior cerebellar artery. No evidence of antegrade flow from the proximal left vertebral artery is seen. Right common carotid arteriogram demonstrates the right external carotid artery and its major branches to be widely patent. Partial reconstitution of the right internal carotid artery at the cavernous segment is seen via the ethmoidal collaterals retrogradely opacifying the right ophthalmic artery. Delayed DSA demonstrates retrograde opacification of the right internal carotid artery to the cervical petrous junction. Partial opacification is seen of the right middle cerebral artery proximally in its branches. Unopacified blood is seen from the contralateral left internal carotid artery via the anterior communicating artery. ENDOVASCULAR REVASCULARIZATION OF OCCLUDED RIGHT INTERNAL CAROTID ARTERY PROXIMALLY Over a 0.035 inch 300 cm Rosen exchange guidewire, an 087 Walrus balloon guide catheter which was prepped with 50% contrast and 50% heparinized saline infusion was advanced and positioned just proximal to the right common carotid bifurcation. The guidewire was removed. Good aspiration obtained from the hub of the balloon guide catheter. A control arteriogram performed through the balloon guide catheter demonstrated near complete occlusion of the right internal carotid artery distally with a proximal very faint string sign. Over an 014 inch standard Synchro micro guidewire with a moderate J configuration an 021 150 cm microcatheter was advanced distal to the balloon guide catheter. Using a torque device, multiple attempts were made to advance the micro guidewire  through the severely narrowed paper thin string sign without success. Multiple attempts were then made using a different 0.014 inch Aristotle micro guidewire without success. A 125 cm 4  French slip catheter was then advanced with the balloon guide catheter and positioned pointing into the occluded right internal carotid artery. Through this an 035 inch Roadrunner guidewire was then gently advanced with minimal resistance through the occluded proximal right internal carotid artery into the distal cervical petrous junction. The 4 French slip catheter was advanced to the mid cervical right ICA. Good aspiration of blood was obtained from the hub of the slip catheter. Very gentle contrast injection demonstrated antegrade flow into the distal right internal carotid artery. The 4 French slip catheter was then exchanged for an 014 inch 300 cm Zoom exchange micro guidewire with a moderate J-tip configuration. The distal end of the exchange micro guidewire was maintained in the horizontal petrous segment. A gentle control arteriogram performed through the balloon guide catheter demonstrated antegrade flow through the proximal right internal carotid artery more distally. A 15 mm smooth filling defect was now evident at the proximal right internal carotid artery. A 4 mm x 30 mm Viatrac 14 angioplasty balloon catheter was then prepped antegradely and retrogradely and then advanced using the rapid exchange technique to the distal end of the balloon guide catheter. Distal and proximal markers were positioned adequate distant from the severe ICA stenosis. A control angioplasty was then performed using micro inflation syringe device via micro tubing with balloon being expanded to just over 8 atmospheres where it was maintained for approximately 30 seconds. The balloon was then deflated and retrieved and removed. Control arteriogram performed through the guide catheter now demonstrated significant improved caliber and flow through the angioplastied segment and distally into the right internal carotid artery extra cranially and intracranially. The previously known occluded to anterior perisylvian branches of the  superior division demonstrated string like flow in the M2 M3 region. Also noted was an occluded distal M3 branch of a parietal branch of the inferior division of the left middle cerebral artery. Following the angioplasty the proximal right internal carotid artery demonstrated smooth contour of the angioplastied segment most likely representing the phased atherosclerotic plaques following the angioplasty. No true intraluminal filling defects were seen. Measurements were then performed of the left internal carotid artery in its most normal segment distally, and proximally the left common carotid artery. It was decided to proceed with placement of a 6/8 mm x 40 mm Xact stent. The stent delivery apparatus was retrogradely purged with heparinized saline infusion. Thereafter using the rapid exchange technique, the stent delivery system was then advanced without difficulty and positioned adequate distance from the site of the angioplastied segment of the right internal carotid proximally. The stent was deployed in the usual manner without difficulty. Proximal flow arrest was initiated by inflating the balloon at the distal end of the balloon guide catheter prior to the angioplasty, and at the time of the advancement of the stent with aspiration at the hub of the balloon guide catheter. Following the placement of the stent, balloon was deflated and aspiration discontinued. With the exchange distal micro guidewire and in the horizontal petrous segment, delivery apparatus was retrieved and removed. A control arteriogram performed through the guide catheter now demonstrated excellent apposition and improved flow through the previously occluded right internal carotid proximally and distally intracranially. Again no evidence of new intraluminal filling defects or occlusion was seen intracranially. Significant spasm was seen just distal to the  distal part of the stent. At least 4 aliquots of 25 mcg of nitroglycerin were then  infused in divided doses with only temporary relief of the spasm. This also demonstrated caliber irregularity of the mid right internal carotid artery. Distal to this the artery remained smooth in caliber. Patient was also given 2.5 mg of verapamil intra-arterially with only partial temporary relief of the spasm. It was, therefore, decided to proceed with placement of a 4.5 mm x 30 mm Neuroform Atlas stent telescoping into the distal segment of the first stent. Over the exchange micro guidewire, an 021 microcatheter was advanced into the horizontal petrous segment. The guidewire was removed. Good aspiration obtained from the hub of the microcatheter. Gentle control arteriogram performed through microcatheter demonstrated safe position of the tip of the microcatheter. This was then connected to continuous heparinized saline infusion. The 4.5 mm x 30 mm Neuroform Atlas stent was then advanced to the distal end of the microcatheter. The entire system was then retrieved more proximally such that the proximal portion of the Neuroform stent was just telescoping into the distal portion of the initial stent. Thereafter, the O ring on the delivery micro guidewire was loosened. With slight forward gentle traction with the right hand on the delivery micro guidewire, with the left hand the distal, and then the proximal portion of the stent were deployed with the proximal end telescoping just inside of the first stent. The delivery micro guidewire was removed with the microcatheter. Gentle control arteriogram performed through the balloon guide catheter in the right common carotid artery now demonstrated excellent flow throughout the entirety of the right internal carotid artery intracranially and extracranially with no change angiographically. 4.5 mg of intra-arterial Integrilin were also infused in order to prevent platelet aggregation within the stents. None was observed. A final control arteriogram performed through the  balloon guide catheter demonstrated continued excellent apposition with inflow in caliber through the right internal carotid artery extra cranially and intracranially with no change in the right MCA distribution. The right anterior cerebral artery remained widely patent. Throughout the procedure, the patient's blood pressure and neurological status remained stable. Patient's ACT was maintained in the region of approximately 218 seconds. Balloon guide catheter was then retrieved and removed. The 8 French Pinnacle sheath was removed with the application of a 7 [place] ExoSeal closure device for hemostasis. Distal pulses remained palpable in both feet unchanged. A CT performed at the end of the procedure demonstrated no evidence of intracranial hemorrhage or mass effect. Patient's general anesthesia was then reversed and the patient was then extubated. Upon recovery, the patient was able to obey simple instructions without difficulty. She was able to bend her left knee against gravity as well as her left hand against gravity. She was then transferred to PACU and then neuro ICU for overnight observations with strict management of the patient's blood pressure. Overnight, the patient had a 9/10 supraorbital headache on the right. This responded to Tylenol. Otherwise, neurologically, the patient had returned to her preprocedural NIH stroke score. The following morning, the patient was more alert, awake and appropriate. Her left upper and lower extremities had gotten to approximately 4/5 strength with minimal decreased to light touch in the left upper and lower extremities. The right groin appeared soft. However, ecchymosis was seen extending more laterally with focal tenderness just at the site of the quick clot. An ultrasound of the groin was requested to rule out the possibility of a small pseudoaneurysm. IMPRESSION: Status post endovascular complete revascularization  of acutely occluded right internal carotid proximally  with stent assisted angioplasty with proximal flow arrest. Placement of a telescoping stent in the mid cervical right ICA due to persistent vasospasm. PLAN: Follow-up in the clinic 2-4 weeks post discharge. Electronically Signed   By: Julieanne Cotton M.D.   On: 08/15/2020 14:07   VAS [place] TRANSCRANIAL DOPPLER W BUBBLES  Result Date: 08/17/2020  Transcranial Doppler with Bubble Patient Name:  KIERSTON PLASENCIA  Date of Exam:   08/17/2020 Medical Rec #: [id number]                Accession #:    [id number] Date of Birth: 08-Nov-1965                Patient Gender: F Patient Age:   22 years Exam Location:  [facility] Procedure:      VAS [place] TRANSCRANIAL DOPPLER W BUBBLES Referring Phys: Scheryl Marten XU --------------------------------------------------------------------------------  Indications: Stroke with PFO. Performing Technologist: [place] Ponder RVS  Examination Guidelines: A complete evaluation includes B-mode imaging, spectral Doppler, color Doppler, and power Doppler as needed of all accessible portions of each vessel. Bilateral testing is considered an integral part of a complete examination. Limited examinations for reoccurring indications may be performed as noted.  Summary:  A vascular evaluation was performed. The right middle cerebral artery was studied. An IV was inserted into the patient's right forearm . Verbal informed consent was obtained.  HITS: [name] grade 1 (< 10) - at rest [name] grade 2 (10-30) - with valsalva Above result indicating a small right to left intracardiac communication. *See table(s) above for TCD measurements and observations.  Diagnosing physician: Marvel Plan MD Electronically signed by Marvel Plan MD on 08/17/2020 at 4:27:12 PM.    Final    VAS [place] GROIN PSEUDOANEURYSM  Result Date: 08/15/2020  ARTERIAL PSEUDOANEURYSM  Patient Name:  MONQUIE FULGHAM  Date of Exam:   08/15/2020 Medical Rec #: [id number]                Accession #:    [id number] Date of Birth:  [birth year]                Patient Gender: F Patient Age:   78 years Exam Location:  [facility] Procedure:      VAS [place] Bobetta Lime Referring Phys: Alwyn Ren --------------------------------------------------------------------------------  Exam: Right groin Indications: Patient complains of groin pain. History: S/p catheterization. Comparison Study: No prior study Performing Technologist: Gertie Fey MHA, RDMS, RVT, RDCS  Examination Guidelines: A complete evaluation includes B-mode imaging, spectral Doppler, color Doppler, and power Doppler as needed of all accessible portions of each vessel. Bilateral testing is considered an integral part of a complete examination. Limited examinations for reoccurring indications may be performed as noted. +------------+----------+---------+------+----------+ Right DuplexPSV (cm/s)Waveform PlaqueComment(s) +------------+----------+---------+------+----------+ CFA            134    triphasic                 +------------+----------+---------+------+----------+ PFA             74    biphasic                  +------------+----------+---------+------+----------+ Prox SFA       111    triphasic                 +------------+----------+---------+------+----------+  Findings: An area with well defined borders measuring 2.3 cm x 2.0 cm was  visualized arising off of the right CFA with ultrasound characteristics of a pseudoaneurysm. Mixed echos within the structure suggest that it is partially thrombosed with a residual diameter  of 1.5 cm x 1.3 cm. The neck measures approximately 0.4 cm wide and 0.3 cm long.  Summary: Ultrasound characteristics of a pseudoaneurysm is visualized arising from the right CFA. Diagnosing physician: Coral ElseVance Brabham MD Electronically signed by Coral ElseVance Brabham MD on 08/15/2020 at 6:54:37 PM.    --------------------------------------------------------------------------------    Final    ECHOCARDIOGRAM  COMPLETE  Result Date: 08/13/2020    ECHOCARDIOGRAM REPORT   Patient Name:   Marisa CyphersNNABELLE F VAN ALSTYNE Date of Exam: 08/13/2020 Medical Rec #:  [id number]               Height:       66.0 in Accession #:    [id number]              Weight:       186.3 lb Date of Birth:  06/29/1965               BSA:          1.940 m Patient Age:    54 years                BP:           130/80 mmHg Patient Gender: F                       HR:           69 bpm. Exam Location:  Inpatient Procedure: 2D Echo, Cardiac Doppler and Color Doppler Indications:    CVA  History:        Patient has no prior history of Echocardiogram examinations.                 Risk Factors:Hypertension.  Sonographer:    Lavenia AtlasBrooke Strickland Referring Phys: [id number] [facility] Center[facility] KHALIQDINA IMPRESSIONS  1. Left ventricular ejection fraction, by estimation, is 60 to 65%. The left ventricle has normal function. The left ventricle has no regional wall motion abnormalities. Left ventricular diastolic parameters are consistent with Grade I diastolic dysfunction (impaired relaxation).  2. Right ventricular systolic function is normal. The right ventricular size is normal. There is mildly elevated pulmonary artery systolic pressure.  3. The mitral valve is normal in structure. No evidence of mitral valve regurgitation. No evidence of mitral stenosis.  4. The aortic valve is tricuspid. Aortic valve regurgitation is not visualized. Mild aortic valve sclerosis is present, with no evidence of aortic valve stenosis.  5. The inferior vena cava is normal in size with greater than 50% respiratory variability, suggesting right atrial pressure of 3 mmHg. Comparison(s): No prior Echocardiogram. FINDINGS  Left Ventricle: Left ventricular ejection fraction, by estimation, is 60 to 65%. The left ventricle has normal function. The left ventricle has no regional wall motion abnormalities. The left ventricular internal cavity size was normal in size. There is  no left ventricular hypertrophy. Left  ventricular diastolic parameters are consistent with Grade I diastolic dysfunction (impaired relaxation). Right Ventricle: The right ventricular size is normal. Right ventricular systolic function is normal. There is mildly elevated pulmonary artery systolic pressure. The tricuspid regurgitant velocity is 2.95 m/s, and with an assumed right atrial pressure of 3 mmHg, the estimated right ventricular systolic pressure is 37.8 mmHg. Left Atrium: Left atrial size was normal in size. Right Atrium: Right atrial size was normal in size. Pericardium:  There is no evidence of pericardial effusion. Mitral Valve: The mitral valve is normal in structure. Mild mitral annular calcification. No evidence of mitral valve regurgitation. No evidence of mitral valve stenosis. Tricuspid Valve: The tricuspid valve is normal in structure. Tricuspid valve regurgitation is mild . No evidence of tricuspid stenosis. Aortic Valve: The aortic valve is tricuspid. Aortic valve regurgitation is not visualized. Mild aortic valve sclerosis is present, with no evidence of aortic valve stenosis. Pulmonic Valve: The pulmonic valve was not well visualized. Pulmonic valve regurgitation is not visualized. No evidence of pulmonic stenosis. Aorta: The aortic root is normal in size and structure. Venous: The inferior vena cava is normal in size with greater than 50% respiratory variability, suggesting right atrial pressure of 3 mmHg. IAS/Shunts: No atrial level shunt detected by color flow Doppler.  LEFT VENTRICLE PLAX 2D LVIDd:         4.40 cm  Diastology LVIDs:         2.80 cm  LV e' medial:    6.20 cm/s LV PW:         1.10 cm  LV E/e' medial:  11.7 LV IVS:        1.10 cm  LV e' lateral:   6.53 cm/s LVOT diam:     2.00 cm  LV E/e' lateral: 11.1 LV SV:         60 LV SV Index:   31 LVOT Area:     3.14 cm  RIGHT VENTRICLE RV Basal diam:  3.30 cm RV S prime:     8.92 cm/s TAPSE (M-mode): 2.9 cm LEFT ATRIUM             Index       RIGHT ATRIUM           Index  LA diam:        4.00 cm 2.06 cm/m  RA Area:     13.40 cm LA Vol (A2C):   31.1 ml 16.03 ml/m RA Volume:   34.60 ml  17.83 ml/m LA Vol (A4C):   42.7 ml 22.01 ml/m LA Biplane Vol: 38.1 ml 19.64 ml/m  AORTIC VALVE LVOT Vmax:   108.00 cm/s LVOT Vmean:  72.400 cm/s LVOT VTI:    0.191 m  AORTA Ao Root diam: 2.70 cm MITRAL VALVE               TRICUSPID VALVE MV Area (PHT): 3.24 cm    TR Peak grad:   34.8 mmHg MV Decel Time: 234 msec    TR Vmax:        295.00 cm/s MV E velocity: 72.40 cm/s MV A velocity: 87.80 cm/s  SHUNTS MV E/A ratio:  0.82        Systemic VTI:  0.19 m                            Systemic Diam: 2.00 cm Olga Millers MD Electronically signed by Olga Millers MD Signature Date/Time: 08/13/2020/12:12:42 PM    Final    ECHO TEE  Result Date: 08/17/2020    TRANSESOPHOGEAL ECHO REPORT   Patient Name:   KHRISTI SCHILLER ALSTYNE Date of Exam: 08/17/2020 Medical Rec #:  [id number]               Height:       66.0 in Accession #:    [id number]              Weight:  186.3 lb Date of Birth:  1965-03-17               BSA:          1.940 m Patient Age:    54 years                BP:           120/58 mmHg Patient Gender: F                       HR:           65 bpm. Exam Location:  Inpatient Procedure: Transesophageal Echo, Cardiac Doppler, Color Doppler and 3D Echo Indications:     Stroke  History:         Patient has prior history of Echocardiogram examinations, most                  recent 08/13/2020. Risk Factors:Hypertension.  Sonographer:     Ross Ludwig RDCS (AE) Referring Phys:  [id number] Cyndi Bender Diagnosing Phys: Jodelle Red MD PROCEDURE: After discussion of the risks and benefits of a TEE, an informed consent was obtained from the patient. The transesophogeal probe was passed without difficulty through the esophogus of the patient. Local oropharyngeal anesthetic was provided with Cetacaine. Sedation performed by different physician. The patient was monitored while under deep sedation.  Anesthestetic sedation was provided intravenously by Anesthesiology: 190.13mg  of Propofol. Image quality was good. The patient's vital signs; including heart rate, blood pressure, and oxygen saturation; remained stable throughout the procedure. The patient developed no complications during the procedure. IMPRESSIONS  1. Left ventricular ejection fraction, by estimation, is 60 to 65%. The left ventricle has normal function.  2. Right ventricular systolic function is normal. The right ventricular size is normal.  3. No left atrial/left atrial appendage thrombus was detected.  4. The mitral valve is normal in structure. Trivial mitral valve regurgitation. No evidence of mitral stenosis.  5. Tricuspid valve regurgitation is mild to moderate.  6. The aortic valve is tricuspid. Aortic valve regurgitation is not visualized. Mild aortic valve sclerosis is present, with no evidence of aortic valve stenosis.  7. There is mild (Grade II) plaque involving the descending aorta.  8. Evidence of atrial level shunting detected by color flow Doppler. Agitated saline contrast bubble study was negative, with no evidence of any interatrial shunt. There is a small patent foramen ovale with bidirectional shunting across atrial septum. Conclusion(s)/Recommendation(s): PFO present, with both right to left and left to right shunting. Communicated with Dr. Roda Shutters, neurology. FINDINGS  Left Ventricle: Left ventricular ejection fraction, by estimation, is 60 to 65%. The left ventricle has normal function. The left ventricular internal cavity size was normal in size. Right Ventricle: The right ventricular size is normal. No increase in right ventricular wall thickness. Right ventricular systolic function is normal. Left Atrium: Left atrial size was normal in size. No left atrial/left atrial appendage thrombus was detected. Right Atrium: Right atrial size was normal in size. Pericardium: There is no evidence of pericardial effusion. Mitral Valve:  The mitral valve is normal in structure. Trivial mitral valve regurgitation. No evidence of mitral valve stenosis. Tricuspid Valve: The tricuspid valve is normal in structure. Tricuspid valve regurgitation is mild to moderate. No evidence of tricuspid stenosis. Aortic Valve: The aortic valve is tricuspid. Aortic valve regurgitation is not visualized. Mild aortic valve sclerosis is present, with no evidence of aortic valve stenosis. Pulmonic Valve: The pulmonic valve  was grossly normal. Pulmonic valve regurgitation is not visualized. No evidence of pulmonic stenosis. Aorta: The aortic root is normal in size and structure. There is mild (Grade II) plaque involving the descending aorta. IAS/Shunts: Evidence of atrial level shunting detected by color flow Doppler. Agitated saline contrast was given intravenously to evaluate for intracardiac shunting. Agitated saline contrast bubble study was negative, with no evidence of any interatrial shunt. A small patent foramen ovale is detected with bidirectional shunting across atrial septum.  TRICUSPID VALVE TR Peak grad:   34.6 mmHg TR Vmax:        294.00 cm/s Jodelle Red MD Electronically signed by Jodelle Red MD Signature Date/Time: 08/17/2020/2:25:52 PM    Final    CT HEAD CODE STROKE WO CONTRAST  Result Date: 08/12/2020 CLINICAL DATA:  Code stroke.  Slurred speech EXAM: CT HEAD WITHOUT CONTRAST TECHNIQUE: Contiguous axial images were obtained from the base of the skull through the vertex without intravenous contrast. COMPARISON:  None. FINDINGS: Brain: Cytotoxic edema appearance in the right insula and frontal operculum no ganglionic or supraganglionic infarct is seen. No acute hemorrhage, hydrocephalus, or collection. Vascular: Hyperdense right M1 suggested on sagittal images, although potentially accentuated by acute infarct in not persistent on the other planes. Skull: No acute finding Sinuses/Orbits: Negative Other: Critical Value/emergent results  were called by telephone at the time of interpretation on 08/12/2020 at 11:43 am to provider Dorothea Glassman , who verbally acknowledged these results. ASPECTS [facility] Stroke Program Early CT Score) - Ganglionic level infarction (caudate, lentiform nuclei, internal capsule, insula, M1-M3 cortex): 5 - Supraganglionic infarction (M4-M6 cortex): 3 Total score (0-10 with 10 being normal): 8 IMPRESSION: 1. Acute infarct in the right insula and frontal operculum, ASPECTS is 8. 2.  Equivocal hyperdensity of the right M1. 3. No acute hemorrhage. Electronically Signed   By: Marnee Spring M.D.   On: 08/12/2020 11:45   VAS US CAROTID  Result Date: 08/15/2020 Carotid Arterial Duplex Study Patient Name:  JASMEET MANTON  Date of Exam:   08/15/2020 Medical Rec #: [id number]                Accession #:    [id number] Date of Birth: 11-29-1965                Patient Gender: F Patient Age:   73 years Exam Location:  [facility] Procedure:      VAS US CAROTID Referring Phys: Alwyn Ren --------------------------------------------------------------------------------  Indications:       History of right ICA occlusion with recent stent angioplasty Comparison Study:  CTA neck 08/12/20- right ICA occlusion, left vertebral artery                    occlusion. Performing Technologist: Gertie Fey MHA, RDMS, RVT, RDCS  Examination Guidelines: A complete evaluation includes B-mode imaging, spectral Doppler, color Doppler, and power Doppler as needed of all accessible portions of each vessel. Bilateral testing is considered an integral part of a complete examination. Limited examinations for reoccurring indications may be performed as noted.  Right Carotid Findings: +----------+--------+--------+--------+------------------+------------------+           PSV cm/sEDV cm/sStenosisPlaque DescriptionComments           +----------+--------+--------+--------+------------------+------------------+ CCA Prox  92      15                                                    +----------+--------+--------+--------+------------------+------------------+  CCA Distal69      15                                intimal thickening +----------+--------+--------+--------+------------------+------------------+ ICA Distal67      30                                                   +----------+--------+--------+--------+------------------+------------------+ ECA       152     13                                                   +----------+--------+--------+--------+------------------+------------------+ +----------+--------+-------+----------------+-------------------+           PSV cm/sEDV cmsDescribe        Arm Pressure (mmHG) +----------+--------+-------+----------------+-------------------+ [id number]            Multiphasic, WNL                    +----------+--------+-------+----------------+-------------------+ +---------+--------+--+--------+--+---------+ VertebralPSV cm/s89EDV cm/s32Antegrade +---------+--------+--+--------+--+---------+  Right Stent(s): +---------------+--+--++++ Prox to Stent  6915 +---------------+--+--++++ Proximal Stent 5519 +---------------+--+--++++ Mid Stent      6628 +---------------+--+--++++ Distal Stent   9940 +---------------+--+--++++ Distal to [id number] +---------------+--+--++++   Left Carotid Findings: +----------+--------+--------+--------+------------------+------------------+           PSV cm/sEDV cm/sStenosisPlaque DescriptionComments           +----------+--------+--------+--------+------------------+------------------+ CCA Prox  111     27                                                   +----------+--------+--------+--------+------------------+------------------+ CCA Distal88      26                                                    +----------+--------+--------+--------+------------------+------------------+ ICA Prox  82      28                                intimal thickening +----------+--------+--------+--------+------------------+------------------+ ICA Distal95      42                                                   +----------+--------+--------+--------+------------------+------------------+ ECA       86      13                                                   +----------+--------+--------+--------+------------------+------------------+ +----------+--------+--------+----------------+-------------------+           PSV cm/sEDV cm/sDescribe  Arm Pressure (mmHG) +----------+--------+--------+----------------+-------------------+ [id number]             Multiphasic, WNL                    +----------+--------+--------+----------------+-------------------+ +---------+--------+--------+------+ VertebralPSV cm/sEDV cm/sAbsent +---------+--------+--------+------+   Summary: Right Carotid: The right ICA stent appears <50% stenosed. Left Carotid: The extracranial vessels were near-normal with only minimal wall               thickening or plaque. Vertebrals:  Right vertebral artery demonstrates antegrade flow. Left vertebral              artery demonstrates an occlusion, consistent with CTA neck. Subclavians: Normal flow hemodynamics were seen in bilateral subclavian              arteries. *See table(s) above for measurements and observations.  Electronically signed by Coral Else MD on 08/15/2020 at 6:56:39 PM.    Final    VAS [place] LOWER EXTREMITY VENOUS (DVT)  Result Date: 08/18/2020  Lower Venous DVT Study Patient Name:  ALIVIANA BURDELL  Date of Exam:   08/17/2020 Medical Rec #: [id number]                Accession #:    [id number] Date of Birth: June 13, 1965                Patient Gender: F Patient Age:   41 years Exam Location:  [facility] Procedure:      VAS [place] LOWER EXTREMITY  VENOUS (DVT) Referring Phys: Scheryl Marten XU --------------------------------------------------------------------------------  Indications: Stroke with pfo.  Comparison Study: no prior Performing Technologist: [place] Ponder RVS  Examination Guidelines: A complete evaluation includes B-mode imaging, spectral Doppler, color Doppler, and power Doppler as needed of all accessible portions of each vessel. Bilateral testing is considered an integral part of a complete examination. Limited examinations for reoccurring indications may be performed as noted. The reflux portion of the exam is performed with the patient in reverse Trendelenburg.  +---------+---------------+---------+-----------+----------+-------------------+ RIGHT    CompressibilityPhasicitySpontaneityPropertiesThrombus Aging      +---------+---------------+---------+-----------+----------+-------------------+ CFV                     Yes      Yes                  pt unable to                                                              tolerate                                                                  compression         +---------+---------------+---------+-----------+----------+-------------------+ SFJ                                                   Not well visualized +---------+---------------+---------+-----------+----------+-------------------+ FV  Prox  Full                                                             +---------+---------------+---------+-----------+----------+-------------------+ FV Mid   Full                                                             +---------+---------------+---------+-----------+----------+-------------------+ FV DistalFull                                                             +---------+---------------+---------+-----------+----------+-------------------+ PFV      Full                                                              +---------+---------------+---------+-----------+----------+-------------------+ POP      Full           Yes      Yes                                      +---------+---------------+---------+-----------+----------+-------------------+ PTV      Full                                                             +---------+---------------+---------+-----------+----------+-------------------+ PERO     Full                                                             +---------+---------------+---------+-----------+----------+-------------------+   +---------+---------------+---------+-----------+----------+--------------+ LEFT     CompressibilityPhasicitySpontaneityPropertiesThrombus Aging +---------+---------------+---------+-----------+----------+--------------+ CFV      Full           Yes      Yes                                 +---------+---------------+---------+-----------+----------+--------------+ SFJ      Full                                                        +---------+---------------+---------+-----------+----------+--------------+ FV Prox  Full                                                        +---------+---------------+---------+-----------+----------+--------------+  FV Mid   Full                                                        +---------+---------------+---------+-----------+----------+--------------+ FV DistalFull                                                        +---------+---------------+---------+-----------+----------+--------------+ PFV      Full                                                        +---------+---------------+---------+-----------+----------+--------------+ POP      Full           Yes      Yes                                 +---------+---------------+---------+-----------+----------+--------------+ PTV      Full                                                         +---------+---------------+---------+-----------+----------+--------------+ PERO     Full                                                        +---------+---------------+---------+-----------+----------+--------------+     Summary: BILATERAL: - No evidence of deep vein thrombosis seen in the lower extremities, bilaterally. -No evidence of popliteal cyst, bilaterally.   *See table(s) above for measurements and observations. Electronically signed by Coral Else MD on 08/18/2020 at 1:26:34 PM.    Final    CT ANGIO HEAD NECK W WO CM W PERF (CODE STROKE)  Result Date: 08/12/2020 CLINICAL DATA:  Slurred speech. EXAM: CT ANGIOGRAPHY HEAD AND NECK CT PERFUSION BRAIN TECHNIQUE: Multidetector CT imaging of the head and neck was performed using the standard protocol during bolus administration of intravenous contrast. Multiplanar CT image reconstructions and MIPs were obtained to evaluate the vascular anatomy. Carotid stenosis measurements (when applicable) are obtained utilizing NASCET criteria, using the distal internal carotid diameter as the denominator. Multiphase CT imaging of the brain was performed following IV bolus contrast injection. Subsequent parametric perfusion maps were calculated using RAPID software. CONTRAST:  OMNIPAQUE IOHEXOL 350 MG/ML SOLN COMPARISON:  None. FINDINGS: CTA NECK FINDINGS Aortic arch: Standard 3 vessel aortic arch with widely patent arch vessel origins. Right carotid system: The common carotid artery is patent. There is predominantly soft plaque at the carotid bifurcation, and the ICA is occluded proximally. Left carotid system: Patent without evidence of stenosis or dissection. Vertebral arteries: The right vertebral artery is patent and dominant without evidence of a significant stenosis or  dissection. The left vertebral artery is occluded at its origin, and there is distal reconstitution of small V2 and V3 segments. Skeleton: No acute osseous abnormality or suspicious  osseous lesion. Mild cervical spondylosis. Asymmetrically advanced left facet arthrosis at C2-3. Other neck: No evidence of cervical lymphadenopathy or mass. Upper chest: Clear lung apices. Review of the MIP images confirms the above findings CTA HEAD FINDINGS Anterior circulation: There is reconstitution of the right ICA beginning in the mid to distal petrous segment with the vessel being patent more distally through the terminus though small in caliber and irregular including a possible severe distal cavernous segment stenosis. The right MCA is patent proximally, however there is diminished enhancement and poor delineation of the right MCA bifurcation suspicious for a subocclusive embolus or severe stenosis. The branches distal to this are grossly patent. The intracranial left ICA is patent with mild atherosclerotic plaque not resulting in a significant stenosis. The left MCA and left ACA are patent without evidence of a flow limiting proximal stenosis. The right ACA is patent although the right A1 segment is small and irregular with moderate narrowing in its midportion. No aneurysm is identified. Posterior circulation: The intracranial vertebral arteries are patent to the basilar. Patent PICA and SCA origins are seen bilaterally. The basilar artery is widely patent. There are small right and diminutive or absent left posterior communicating arteries. The PCAs are patent without evidence of a significant proximal stenosis. No aneurysm is identified. Venous sinuses: Patent. Anatomic variants: None. Review of the MIP images confirms the above findings CT Brain Perfusion Findings: ASPECTS: 8 CBF (<30%) Volume: 0mL Perfusion (Tmax>6.0s) volume: 25mL Mismatch Volume: 25mL Infarction Location: The core infarct in the right insula and right frontal operculum on the earlier noncontrast head CT is not detected by the automated RAPID processing. The 25 mL penumbra indicated by RAPID appears to be located slightly posterior  to the infarct on CT. IMPRESSION: 1. 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 2. Finding suspicious for a subocclusive embolus versus severe stenosis at the right MCA bifurcation. 3. Occlusion of the left vertebral artery at its origin with distal reconstitution. 4. CTP results as detailed above including a 25 mL penumbra. These results were communicated to Dr. Selina Cooley at 12:31 pm on 08/12/2020 by text page via the [facility] messaging system. Electronically Signed   By: Sebastian Ache M.D.   On: 08/12/2020 12:54   IR ANGIO INTRA EXTRACRAN SEL COM CAROTID INNOMINATE UNI L MOD SED  Result Date: 08/16/2020 CLINICAL DATA:  Right cerebral hemisphere ischemic stroke secondary to acute occlusion of the right internal carotid artery proximally with delayed string sign initially. EXAM: INTRACRANIAL STENT (INCL PTA) COMPARISON:  CT angiogram of the head and neck of August 12, 2020, and MRI MRA of the brain of August 13, 2020. MEDICATIONS: Heparin 3,500 units IV. Vancomycin antibiotic was administered within 1 hour of the procedure. ANESTHESIA/SEDATION: General anesthesia. CONTRAST:  Omnipaque 300 approximately 180 mL. FLUOROSCOPY TIME:  Fluoroscopy Time: 62 minutes 30 seconds (2136 mGy). COMPLICATIONS: None immediate. TECHNIQUE: Informed written consent was obtained from the patient after a thorough discussion of the procedural risks, benefits and alternatives. All questions were addressed. Maximal Sterile Barrier Technique was utilized including caps, mask, sterile gowns, sterile gloves, sterile drape, hand hygiene and skin antiseptic. A timeout was performed prior to the initiation of the procedure. The right groin was prepped and draped in the usual sterile fashion. Thereafter using modified Seldinger technique, transfemoral access into the right common femoral artery  was obtained without difficulty. Over a 0.035 inch guidewire, an 8 [place] Pinnacle 25 cm sheath was inserted. Through this, and also over 0.035  inch guidewire, a 5 [place] JB 1 catheter was advanced to the aortic arch region and selectively positioned in the right common carotid artery, the right vertebral artery, the left common carotid artery and the left vertebral artery. FINDINGS: The left subclavian arteriogram demonstrates nonvisualization of the left vertebral artery. No distal reconstitution of the vessel is seen from branches arising from the ascending cervical branch of the thyrocervical trunk. Left common carotid arteriogram demonstrates the left external carotid artery and its major branches to be widely patent. The left internal carotid artery at the bulb to the cranial skull base is widely patent. The petrous, cavernous, and supraclinoid segments are patent as well. The left middle cerebral artery and the left anterior cerebral artery opacify into the capillary and venous phases. Prompt cross-filling via the anterior communicating artery of the right anterior cerebral A1 segment, and partial right middle cerebral artery distribution is noted with evidence of occlusion of the anterior perisylvian branches of the superior division of the right middle cerebral artery. The right vertebral artery origin is widely patent. The vessel is seen to opacify to the cranial skull base to the right posterior-inferior cerebellar artery and the right vertebrobasilar junction. The basilar artery, the posterior cerebral arteries, the superior cerebellar arteries and the anterior-inferior cerebellar arteries opacify into the capillary and venous phases. Collaterals arising from the right PCA P3 segment are seen to supply the right posterior parietal, cortical and subcortical region. Also demonstrated is retrograde opacification of the left vertebrobasilar junction to the left posterior-inferior cerebellar artery. No evidence of antegrade flow from the proximal left vertebral artery is seen. Right common carotid arteriogram demonstrates the right external carotid  artery and its major branches to be widely patent. Partial reconstitution of the right internal carotid artery at the cavernous segment is seen via the ethmoidal collaterals retrogradely opacifying the right ophthalmic artery. Delayed DSA demonstrates retrograde opacification of the right internal carotid artery to the cervical petrous junction. Partial opacification is seen of the right middle cerebral artery proximally in its branches. Unopacified blood is seen from the contralateral left internal carotid artery via the anterior communicating artery. ENDOVASCULAR REVASCULARIZATION OF OCCLUDED RIGHT INTERNAL CAROTID ARTERY PROXIMALLY Over a 0.035 inch 300 cm Rosen exchange guidewire, an 087 [place] balloon guide catheter which was prepped with 50% contrast and 50% heparinized saline infusion was advanced and positioned just proximal to the right common carotid bifurcation. The guidewire was removed. Good aspiration obtained from the hub of the balloon guide catheter. A control arteriogram performed through the balloon guide catheter demonstrated near complete occlusion of the right internal carotid artery distally with a proximal very faint string sign. Over an 014 inch standard Synchro micro guidewire with a moderate J configuration an 021 150 cm microcatheter was advanced distal to the balloon guide catheter. Using a torque device, multiple attempts were made to advance the micro guidewire through the severely narrowed paper thin string sign without success. Multiple attempts were then made using a different 0.014 inch Aristotle micro guidewire without success. A 125 cm 4 French slip catheter was then advanced with the balloon guide catheter and positioned pointing into the occluded right internal carotid artery. Through this an 035 inch Roadrunner guidewire was then gently advanced with minimal resistance through the occluded proximal right internal carotid artery into the distal cervical petrous junction. The 4  [place]  slip catheter was advanced to the mid cervical right ICA. Good aspiration of blood was obtained from the hub of the slip catheter. Very gentle contrast injection demonstrated antegrade flow into the distal right internal carotid artery. The 4 French slip catheter was then exchanged for an 014 inch 300 cm Zoom exchange micro guidewire with a moderate J-tip configuration. The distal end of the exchange micro guidewire was maintained in the horizontal petrous segment. A gentle control arteriogram performed through the balloon guide catheter demonstrated antegrade flow through the proximal right internal carotid artery more distally. A 15 mm smooth filling defect was now evident at the proximal right internal carotid artery. A 4 mm x 30 mm Viatrac 14 angioplasty balloon catheter was then prepped antegradely and retrogradely and then advanced using the rapid exchange technique to the distal end of the balloon guide catheter. Distal and proximal markers were positioned adequate distant from the severe ICA stenosis. A control angioplasty was then performed using micro inflation syringe device via micro tubing with balloon being expanded to just over 8 atmospheres where it was maintained for approximately 30 seconds. The balloon was then deflated and retrieved and removed. Control arteriogram performed through the guide catheter now demonstrated significant improved caliber and flow through the angioplastied segment and distally into the right internal carotid artery extra cranially and intracranially. The previously known occluded to anterior perisylvian branches of the superior division demonstrated string like flow in the M2 M3 region. Also noted was an occluded distal M3 branch of a parietal branch of the inferior division of the left middle cerebral artery. Following the angioplasty the proximal right internal carotid artery demonstrated smooth contour of the angioplastied segment most likely representing the  phased atherosclerotic plaques following the angioplasty. No true intraluminal filling defects were seen. Measurements were then performed of the left internal carotid artery in its most normal segment distally, and proximally the left common carotid artery. It was decided to proceed with placement of a 6/8 mm x 40 mm Xact stent. The stent delivery apparatus was retrogradely purged with heparinized saline infusion. Thereafter using the rapid exchange technique, the stent delivery system was then advanced without difficulty and positioned adequate distance from the site of the angioplastied segment of the right internal carotid proximally. The stent was deployed in the usual manner without difficulty. Proximal flow arrest was initiated by inflating the balloon at the distal end of the balloon guide catheter prior to the angioplasty, and at the time of the advancement of the stent with aspiration at the hub of the balloon guide catheter. Following the placement of the stent, balloon was deflated and aspiration discontinued. With the exchange distal micro guidewire and in the horizontal petrous segment, delivery apparatus was retrieved and removed. A control arteriogram performed through the guide catheter now demonstrated excellent apposition and improved flow through the previously occluded right internal carotid proximally and distally intracranially. Again no evidence of new intraluminal filling defects or occlusion was seen intracranially. Significant spasm was seen just distal to the distal part of the stent. At least 4 aliquots of 25 mcg of nitroglycerin were then infused in divided doses with only temporary relief of the spasm. This also demonstrated caliber irregularity of the mid right internal carotid artery. Distal to this the artery remained smooth in caliber. Patient was also given 2.5 mg of verapamil intra-arterially with only partial temporary relief of the spasm. It was, therefore, decided to proceed  with placement of a 4.5 mm x 30 mm Neuroform  Atlas stent telescoping into the distal segment of the first stent. Over the exchange micro guidewire, an 021 microcatheter was advanced into the horizontal petrous segment. The guidewire was removed. Good aspiration obtained from the hub of the microcatheter. Gentle control arteriogram performed through microcatheter demonstrated safe position of the tip of the microcatheter. This was then connected to continuous heparinized saline infusion. The 4.5 mm x 30 mm Neuroform Atlas stent was then advanced to the distal end of the microcatheter. The entire system was then retrieved more proximally such that the proximal portion of the Neuroform stent was just telescoping into the distal portion of the initial stent. Thereafter, the O ring on the delivery micro guidewire was loosened. With slight forward gentle traction with the right hand on the delivery micro guidewire, with the left hand the distal, and then the proximal portion of the stent were deployed with the proximal end telescoping just inside of the first stent. The delivery micro guidewire was removed with the microcatheter. Gentle control arteriogram performed through the balloon guide catheter in the right common carotid artery now demonstrated excellent flow throughout the entirety of the right internal carotid artery intracranially and extracranially with no change angiographically. 4.5 mg of intra-arterial Integrilin were also infused in order to prevent platelet aggregation within the stents. None was observed. A final control arteriogram performed through the balloon guide catheter demonstrated continued excellent apposition with inflow in caliber through the right internal carotid artery extra cranially and intracranially with no change in the right MCA distribution. The right anterior cerebral artery remained widely patent. Throughout the procedure, the patient's blood pressure and neurological status  remained stable. Patient's ACT was maintained in the region of approximately 218 seconds. Balloon guide catheter was then retrieved and removed. The 8 French Pinnacle sheath was removed with the application of a 7 [place] ExoSeal closure device for hemostasis. Distal pulses remained palpable in both feet unchanged. A CT performed at the end of the procedure demonstrated no evidence of intracranial hemorrhage or mass effect. Patient's general anesthesia was then reversed and the patient was then extubated. Upon recovery, the patient was able to obey simple instructions without difficulty. She was able to bend her left knee against gravity as well as her left hand against gravity. She was then transferred to PACU and then neuro ICU for overnight observations with strict management of the patient's blood pressure. Overnight, the patient had a 9/10 supraorbital headache on the right. This responded to Tylenol. Otherwise, neurologically, the patient had returned to her preprocedural NIH stroke score. The following morning, the patient was more alert, awake and appropriate. Her left upper and lower extremities had gotten to approximately 4/5 strength with minimal decreased to light touch in the left upper and lower extremities. The right groin appeared soft. However, ecchymosis was seen extending more laterally with focal tenderness just at the site of the quick clot. An ultrasound of the groin was requested to rule out the possibility of a small pseudoaneurysm. IMPRESSION: Status post endovascular complete revascularization of acutely occluded right internal carotid proximally with stent assisted angioplasty with proximal flow arrest. Placement of a telescoping stent in the mid cervical right ICA due to persistent vasospasm. PLAN: Follow-up in the clinic 2-4 weeks post discharge. Electronically Signed   By: Julieanne Cotton M.D.   On: 08/15/2020 14:07   IR ANGIO INTRA EXTRACRAN SEL INTERNAL CAROTID UNI R MOD  SED  Result Date: 08/16/2020 CLINICAL DATA:  Right cerebral hemisphere ischemic stroke secondary to acute  occlusion of the right internal carotid artery proximally with delayed string sign initially. EXAM: INTRACRANIAL STENT (INCL PTA) COMPARISON:  CT angiogram of the head and neck of August 12, 2020, and MRI MRA of the brain of August 13, 2020. MEDICATIONS: Heparin 3,500 units IV. Vancomycin antibiotic was administered within 1 hour of the procedure. ANESTHESIA/SEDATION: General anesthesia. CONTRAST:  Omnipaque 300 approximately 180 mL. FLUOROSCOPY TIME:  Fluoroscopy Time: 62 minutes 30 seconds (2136 mGy). COMPLICATIONS: None immediate. TECHNIQUE: Informed written consent was obtained from the patient after a thorough discussion of the procedural risks, benefits and alternatives. All questions were addressed. Maximal Sterile Barrier Technique was utilized including caps, mask, sterile gowns, sterile gloves, sterile drape, hand hygiene and skin antiseptic. A timeout was performed prior to the initiation of the procedure. The right groin was prepped and draped in the usual sterile fashion. Thereafter using modified Seldinger technique, transfemoral access into the right common femoral artery was obtained without difficulty. Over a 0.035 inch guidewire, an 8 [place] Pinnacle 25 cm sheath was inserted. Through this, and also over 0.035 inch guidewire, a 5 [place] JB 1 catheter was advanced to the aortic arch region and selectively positioned in the right common carotid artery, the right vertebral artery, the left common carotid artery and the left vertebral artery. FINDINGS: The left subclavian arteriogram demonstrates nonvisualization of the left vertebral artery. No distal reconstitution of the vessel is seen from branches arising from the ascending cervical branch of the thyrocervical trunk. Left common carotid arteriogram demonstrates the left external carotid artery and its major branches to be widely patent. The  left internal carotid artery at the bulb to the cranial skull base is widely patent. The petrous, cavernous, and supraclinoid segments are patent as well. The left middle cerebral artery and the left anterior cerebral artery opacify into the capillary and venous phases. Prompt cross-filling via the anterior communicating artery of the right anterior cerebral A1 segment, and partial right middle cerebral artery distribution is noted with evidence of occlusion of the anterior perisylvian branches of the superior division of the right middle cerebral artery. The right vertebral artery origin is widely patent. The vessel is seen to opacify to the cranial skull base to the right posterior-inferior cerebellar artery and the right vertebrobasilar junction. The basilar artery, the posterior cerebral arteries, the superior cerebellar arteries and the anterior-inferior cerebellar arteries opacify into the capillary and venous phases. Collaterals arising from the right PCA P3 segment are seen to supply the right posterior parietal, cortical and subcortical region. Also demonstrated is retrograde opacification of the left vertebrobasilar junction to the left posterior-inferior cerebellar artery. No evidence of antegrade flow from the proximal left vertebral artery is seen. Right common carotid arteriogram demonstrates the right external carotid artery and its major branches to be widely patent. Partial reconstitution of the right internal carotid artery at the cavernous segment is seen via the ethmoidal collaterals retrogradely opacifying the right ophthalmic artery. Delayed DSA demonstrates retrograde opacification of the right internal carotid artery to the cervical petrous junction. Partial opacification is seen of the right middle cerebral artery proximally in its branches. Unopacified blood is seen from the contralateral left internal carotid artery via the anterior communicating artery. ENDOVASCULAR REVASCULARIZATION OF  OCCLUDED RIGHT INTERNAL CAROTID ARTERY PROXIMALLY Over a 0.035 inch 300 cm Rosen exchange guidewire, an 087 [place] balloon guide catheter which was prepped with 50% contrast and 50% heparinized saline infusion was advanced and positioned just proximal to the right common carotid bifurcation. The guidewire was  removed. Good aspiration obtained from the hub of the balloon guide catheter. A control arteriogram performed through the balloon guide catheter demonstrated near complete occlusion of the right internal carotid artery distally with a proximal very faint string sign. Over an 014 inch standard Synchro micro guidewire with a moderate J configuration an 021 150 cm microcatheter was advanced distal to the balloon guide catheter. Using a torque device, multiple attempts were made to advance the micro guidewire through the severely narrowed paper thin string sign without success. Multiple attempts were then made using a different 0.014 inch Aristotle micro guidewire without success. A 125 cm 4 French slip catheter was then advanced with the balloon guide catheter and positioned pointing into the occluded right internal carotid artery. Through this an 035 inch Roadrunner guidewire was then gently advanced with minimal resistance through the occluded proximal right internal carotid artery into the distal cervical petrous junction. The 4 French slip catheter was advanced to the mid cervical right ICA. Good aspiration of blood was obtained from the hub of the slip catheter. Very gentle contrast injection demonstrated antegrade flow into the distal right internal carotid artery. The 4 French slip catheter was then exchanged for an 014 inch 300 cm Zoom exchange micro guidewire with a moderate J-tip configuration. The distal end of the exchange micro guidewire was maintained in the horizontal petrous segment. A gentle control arteriogram performed through the balloon guide catheter demonstrated antegrade flow through the  proximal right internal carotid artery more distally. A 15 mm smooth filling defect was now evident at the proximal right internal carotid artery. A 4 mm x 30 mm Viatrac 14 angioplasty balloon catheter was then prepped antegradely and retrogradely and then advanced using the rapid exchange technique to the distal end of the balloon guide catheter. Distal and proximal markers were positioned adequate distant from the severe ICA stenosis. A control angioplasty was then performed using micro inflation syringe device via micro tubing with balloon being expanded to just over 8 atmospheres where it was maintained for approximately 30 seconds. The balloon was then deflated and retrieved and removed. Control arteriogram performed through the guide catheter now demonstrated significant improved caliber and flow through the angioplastied segment and distally into the right internal carotid artery extra cranially and intracranially. The previously known occluded to anterior perisylvian branches of the superior division demonstrated string like flow in the M2 M3 region. Also noted was an occluded distal M3 branch of a parietal branch of the inferior division of the left middle cerebral artery. Following the angioplasty the proximal right internal carotid artery demonstrated smooth contour of the angioplastied segment most likely representing the phased atherosclerotic plaques following the angioplasty. No true intraluminal filling defects were seen. Measurements were then performed of the left internal carotid artery in its most normal segment distally, and proximally the left common carotid artery. It was decided to proceed with placement of a 6/8 mm x 40 mm Xact stent. The stent delivery apparatus was retrogradely purged with heparinized saline infusion. Thereafter using the rapid exchange technique, the stent delivery system was then advanced without difficulty and positioned adequate distance from the site of the  angioplastied segment of the right internal carotid proximally. The stent was deployed in the usual manner without difficulty. Proximal flow arrest was initiated by inflating the balloon at the distal end of the balloon guide catheter prior to the angioplasty, and at the time of the advancement of the stent with aspiration at the hub of the balloon guide  catheter. Following the placement of the stent, balloon was deflated and aspiration discontinued. With the exchange distal micro guidewire and in the horizontal petrous segment, delivery apparatus was retrieved and removed. A control arteriogram performed through the guide catheter now demonstrated excellent apposition and improved flow through the previously occluded right internal carotid proximally and distally intracranially. Again no evidence of new intraluminal filling defects or occlusion was seen intracranially. Significant spasm was seen just distal to the distal part of the stent. At least 4 aliquots of 25 mcg of nitroglycerin were then infused in divided doses with only temporary relief of the spasm. This also demonstrated caliber irregularity of the mid right internal carotid artery. Distal to this the artery remained smooth in caliber. Patient was also given 2.5 mg of verapamil intra-arterially with only partial temporary relief of the spasm. It was, therefore, decided to proceed with placement of a 4.5 mm x 30 mm Neuroform Atlas stent telescoping into the distal segment of the first stent. Over the exchange micro guidewire, an 021 microcatheter was advanced into the horizontal petrous segment. The guidewire was removed. Good aspiration obtained from the hub of the microcatheter. Gentle control arteriogram performed through microcatheter demonstrated safe position of the tip of the microcatheter. This was then connected to continuous heparinized saline infusion. The 4.5 mm x 30 mm Neuroform Atlas stent was then advanced to the distal end of the  microcatheter. The entire system was then retrieved more proximally such that the proximal portion of the Neuroform stent was just telescoping into the distal portion of the initial stent. Thereafter, the O ring on the delivery micro guidewire was loosened. With slight forward gentle traction with the right hand on the delivery micro guidewire, with the left hand the distal, and then the proximal portion of the stent were deployed with the proximal end telescoping just inside of the first stent. The delivery micro guidewire was removed with the microcatheter. Gentle control arteriogram performed through the balloon guide catheter in the right common carotid artery now demonstrated excellent flow throughout the entirety of the right internal carotid artery intracranially and extracranially with no change angiographically. 4.5 mg of intra-arterial Integrilin were also infused in order to prevent platelet aggregation within the stents. None was observed. A final control arteriogram performed through the balloon guide catheter demonstrated continued excellent apposition with inflow in caliber through the right internal carotid artery extra cranially and intracranially with no change in the right MCA distribution. The right anterior cerebral artery remained widely patent. Throughout the procedure, the patient's blood pressure and neurological status remained stable. Patient's ACT was maintained in the region of approximately 218 seconds. Balloon guide catheter was then retrieved and removed. The 8 French Pinnacle sheath was removed with the application of a 7 [place] ExoSeal closure device for hemostasis. Distal pulses remained palpable in both feet unchanged. A CT performed at the end of the procedure demonstrated no evidence of intracranial hemorrhage or mass effect. Patient's general anesthesia was then reversed and the patient was then extubated. Upon recovery, the patient was able to obey simple instructions without  difficulty. She was able to bend her left knee against gravity as well as her left hand against gravity. She was then transferred to PACU and then neuro ICU for overnight observations with strict management of the patient's blood pressure. Overnight, the patient had a 9/10 supraorbital headache on the right. This responded to Tylenol. Otherwise, neurologically, the patient had returned to her preprocedural NIH stroke score. The following morning,  the patient was more alert, awake and appropriate. Her left upper and lower extremities had gotten to approximately 4/5 strength with minimal decreased to light touch in the left upper and lower extremities. The right groin appeared soft. However, ecchymosis was seen extending more laterally with focal tenderness just at the site of the quick clot. An ultrasound of the groin was requested to rule out the possibility of a small pseudoaneurysm. IMPRESSION: Status post endovascular complete revascularization of acutely occluded right internal carotid proximally with stent assisted angioplasty with proximal flow arrest. Placement of a telescoping stent in the mid cervical right ICA due to persistent vasospasm. PLAN: Follow-up in the clinic 2-4 weeks post discharge. Electronically Signed   By: Julieanne Cotton M.D.   On: 08/15/2020 14:07   IR ANGIO VERTEBRAL SEL SUBCLAVIAN INNOMINATE BILAT MOD SED  Result Date: 08/16/2020 CLINICAL DATA:  Right cerebral hemisphere ischemic stroke secondary to acute occlusion of the right internal carotid artery proximally with delayed string sign initially. EXAM: INTRACRANIAL STENT (INCL PTA) COMPARISON:  CT angiogram of the head and neck of August 12, 2020, and MRI MRA of the brain of August 13, 2020. MEDICATIONS: Heparin 3,500 units IV. Vancomycin antibiotic was administered within 1 hour of the procedure. ANESTHESIA/SEDATION: General anesthesia. CONTRAST:  Omnipaque 300 approximately 180 mL. FLUOROSCOPY TIME:  Fluoroscopy Time: 62 minutes  30 seconds (2136 mGy). COMPLICATIONS: None immediate. TECHNIQUE: Informed written consent was obtained from the patient after a thorough discussion of the procedural risks, benefits and alternatives. All questions were addressed. Maximal Sterile Barrier Technique was utilized including caps, mask, sterile gowns, sterile gloves, sterile drape, hand hygiene and skin antiseptic. A timeout was performed prior to the initiation of the procedure. The right groin was prepped and draped in the usual sterile fashion. Thereafter using modified Seldinger technique, transfemoral access into the right common femoral artery was obtained without difficulty. Over a 0.035 inch guidewire, an 8 [place] Pinnacle 25 cm sheath was inserted. Through this, and also over 0.035 inch guidewire, a 5 [place] JB 1 catheter was advanced to the aortic arch region and selectively positioned in the right common carotid artery, the right vertebral artery, the left common carotid artery and the left vertebral artery. FINDINGS: The left subclavian arteriogram demonstrates nonvisualization of the left vertebral artery. No distal reconstitution of the vessel is seen from branches arising from the ascending cervical branch of the thyrocervical trunk. Left common carotid arteriogram demonstrates the left external carotid artery and its major branches to be widely patent. The left internal carotid artery at the bulb to the cranial skull base is widely patent. The petrous, cavernous, and supraclinoid segments are patent as well. The left middle cerebral artery and the left anterior cerebral artery opacify into the capillary and venous phases. Prompt cross-filling via the anterior communicating artery of the right anterior cerebral A1 segment, and partial right middle cerebral artery distribution is noted with evidence of occlusion of the anterior perisylvian branches of the superior division of the right middle cerebral artery. The right vertebral artery  origin is widely patent. The vessel is seen to opacify to the cranial skull base to the right posterior-inferior cerebellar artery and the right vertebrobasilar junction. The basilar artery, the posterior cerebral arteries, the superior cerebellar arteries and the anterior-inferior cerebellar arteries opacify into the capillary and venous phases. Collaterals arising from the right PCA P3 segment are seen to supply the right posterior parietal, cortical and subcortical region. Also demonstrated is retrograde opacification of the left vertebrobasilar junction  to the left posterior-inferior cerebellar artery. No evidence of antegrade flow from the proximal left vertebral artery is seen. Right common carotid arteriogram demonstrates the right external carotid artery and its major branches to be widely patent. Partial reconstitution of the right internal carotid artery at the cavernous segment is seen via the ethmoidal collaterals retrogradely opacifying the right ophthalmic artery. Delayed DSA demonstrates retrograde opacification of the right internal carotid artery to the cervical petrous junction. Partial opacification is seen of the right middle cerebral artery proximally in its branches. Unopacified blood is seen from the contralateral left internal carotid artery via the anterior communicating artery. ENDOVASCULAR REVASCULARIZATION OF OCCLUDED RIGHT INTERNAL CAROTID ARTERY PROXIMALLY Over a 0.035 inch 300 cm Rosen exchange guidewire, an 087 Walrus balloon guide catheter which was prepped with 50% contrast and 50% heparinized saline infusion was advanced and positioned just proximal to the right common carotid bifurcation. The guidewire was removed. Good aspiration obtained from the hub of the balloon guide catheter. A control arteriogram performed through the balloon guide catheter demonstrated near complete occlusion of the right internal carotid artery distally with a proximal very faint string sign. Over an 014  inch standard Synchro micro guidewire with a moderate J configuration an 021 150 cm microcatheter was advanced distal to the balloon guide catheter. Using a torque device, multiple attempts were made to advance the micro guidewire through the severely narrowed paper thin string sign without success. Multiple attempts were then made using a different 0.014 inch Aristotle micro guidewire without success. A 125 cm 4 French slip catheter was then advanced with the balloon guide catheter and positioned pointing into the occluded right internal carotid artery. Through this an 035 inch Roadrunner guidewire was then gently advanced with minimal resistance through the occluded proximal right internal carotid artery into the distal cervical petrous junction. The 4 French slip catheter was advanced to the mid cervical right ICA. Good aspiration of blood was obtained from the hub of the slip catheter. Very gentle contrast injection demonstrated antegrade flow into the distal right internal carotid artery. The 4 French slip catheter was then exchanged for an 014 inch 300 cm Zoom exchange micro guidewire with a moderate J-tip configuration. The distal end of the exchange micro guidewire was maintained in the horizontal petrous segment. A gentle control arteriogram performed through the balloon guide catheter demonstrated antegrade flow through the proximal right internal carotid artery more distally. A 15 mm smooth filling defect was now evident at the proximal right internal carotid artery. A 4 mm x 30 mm Viatrac 14 angioplasty balloon catheter was then prepped antegradely and retrogradely and then advanced using the rapid exchange technique to the distal end of the balloon guide catheter. Distal and proximal markers were positioned adequate distant from the severe ICA stenosis. A control angioplasty was then performed using micro inflation syringe device via micro tubing with balloon being expanded to just over 8 atmospheres  where it was maintained for approximately 30 seconds. The balloon was then deflated and retrieved and removed. Control arteriogram performed through the guide catheter now demonstrated significant improved caliber and flow through the angioplastied segment and distally into the right internal carotid artery extra cranially and intracranially. The previously known occluded to anterior perisylvian branches of the superior division demonstrated string like flow in the M2 M3 region. Also noted was an occluded distal M3 branch of a parietal branch of the inferior division of the left middle cerebral artery. Following the angioplasty the proximal right internal carotid artery  demonstrated smooth contour of the angioplastied segment most likely representing the phased atherosclerotic plaques following the angioplasty. No true intraluminal filling defects were seen. Measurements were then performed of the left internal carotid artery in its most normal segment distally, and proximally the left common carotid artery. It was decided to proceed with placement of a 6/8 mm x 40 mm Xact stent. The stent delivery apparatus was retrogradely purged with heparinized saline infusion. Thereafter using the rapid exchange technique, the stent delivery system was then advanced without difficulty and positioned adequate distance from the site of the angioplastied segment of the right internal carotid proximally. The stent was deployed in the usual manner without difficulty. Proximal flow arrest was initiated by inflating the balloon at the distal end of the balloon guide catheter prior to the angioplasty, and at the time of the advancement of the stent with aspiration at the hub of the balloon guide catheter. Following the placement of the stent, balloon was deflated and aspiration discontinued. With the exchange distal micro guidewire and in the horizontal petrous segment, delivery apparatus was retrieved and removed. A control  arteriogram performed through the guide catheter now demonstrated excellent apposition and improved flow through the previously occluded right internal carotid proximally and distally intracranially. Again no evidence of new intraluminal filling defects or occlusion was seen intracranially. Significant spasm was seen just distal to the distal part of the stent. At least 4 aliquots of 25 mcg of nitroglycerin were then infused in divided doses with only temporary relief of the spasm. This also demonstrated caliber irregularity of the mid right internal carotid artery. Distal to this the artery remained smooth in caliber. Patient was also given 2.5 mg of verapamil intra-arterially with only partial temporary relief of the spasm. It was, therefore, decided to proceed with placement of a 4.5 mm x 30 mm Neuroform Atlas stent telescoping into the distal segment of the first stent. Over the exchange micro guidewire, an 021 microcatheter was advanced into the horizontal petrous segment. The guidewire was removed. Good aspiration obtained from the hub of the microcatheter. Gentle control arteriogram performed through microcatheter demonstrated safe position of the tip of the microcatheter. This was then connected to continuous heparinized saline infusion. The 4.5 mm x 30 mm Neuroform Atlas stent was then advanced to the distal end of the microcatheter. The entire system was then retrieved more proximally such that the proximal portion of the Neuroform stent was just telescoping into the distal portion of the initial stent. Thereafter, the O ring on the delivery micro guidewire was loosened. With slight forward gentle traction with the right hand on the delivery micro guidewire, with the left hand the distal, and then the proximal portion of the stent were deployed with the proximal end telescoping just inside of the first stent. The delivery micro guidewire was removed with the microcatheter. Gentle control arteriogram  performed through the balloon guide catheter in the right common carotid artery now demonstrated excellent flow throughout the entirety of the right internal carotid artery intracranially and extracranially with no change angiographically. 4.5 mg of intra-arterial Integrilin were also infused in order to prevent platelet aggregation within the stents. None was observed. A final control arteriogram performed through the balloon guide catheter demonstrated continued excellent apposition with inflow in caliber through the right internal carotid artery extra cranially and intracranially with no change in the right MCA distribution. The right anterior cerebral artery remained widely patent. Throughout the procedure, the patient's blood pressure and neurological status remained stable.  Patient's ACT was maintained in the region of approximately 218 seconds. Balloon guide catheter was then retrieved and removed. The 8 French Pinnacle sheath was removed with the application of a 7 [place] ExoSeal closure device for hemostasis. Distal pulses remained palpable in both feet unchanged. A CT performed at the end of the procedure demonstrated no evidence of intracranial hemorrhage or mass effect. Patient's general anesthesia was then reversed and the patient was then extubated. Upon recovery, the patient was able to obey simple instructions without difficulty. She was able to bend her left knee against gravity as well as her left hand against gravity. She was then transferred to PACU and then neuro ICU for overnight observations with strict management of the patient's blood pressure. Overnight, the patient had a 9/10 supraorbital headache on the right. This responded to Tylenol. Otherwise, neurologically, the patient had returned to her preprocedural NIH stroke score. The following morning, the patient was more alert, awake and appropriate. Her left upper and lower extremities had gotten to approximately 4/5 strength with minimal  decreased to light touch in the left upper and lower extremities. The right groin appeared soft. However, ecchymosis was seen extending more laterally with focal tenderness just at the site of the quick clot. An ultrasound of the groin was requested to rule out the possibility of a small pseudoaneurysm. IMPRESSION: Status post endovascular complete revascularization of acutely occluded right internal carotid proximally with stent assisted angioplasty with proximal flow arrest. Placement of a telescoping stent in the mid cervical right ICA due to persistent vasospasm. PLAN: Follow-up in the clinic 2-4 weeks post discharge. Electronically Signed   By: Julieanne Cotton M.D.   On: 08/15/2020 14:07    Labs:  Basic Metabolic Panel: Recent Labs  Lab 08/22/20 1618 08/23/20 0600  NA  --  139  K  --  3.7  CL  --  105  CO2  --  25  GLUCOSE  --  115*  BUN  --  11  CREATININE 0.74 0.57  CALCIUM  --  9.0    CBC: Recent Labs  Lab 08/22/20 1618 08/23/20 0600  WBC 12.6* 12.3*  NEUTROABS  --  8.9*  HGB 10.8* 10.9*  HCT 33.2* 32.2*  MCV 86.2 84.7  PLT 440* 438*    CBG: No results for input(s): GLUCAP in the last 168 hours.  Family history.  Positive for hypertension as well as hyperlipidemia.  Denies any colon cancer esophageal cancer or rectal cancer  Brief HPI:   Vicki Gonzales is a 55 y.o. right-handed female with history of hypertension hypothyroidism migraine headaches and tobacco use.  Per chart review lives with spouse independent prior to admission.  Presented 08/12/2020 to [facility] with dysarthria and left-sided weakness of acute onset.  Cranial CT scan unremarkable for acute intracranial process.  CT angiogram head and neck proximal right ICA occlusion with intracranial reconstitution.  Findings suspicious for subocclusive embolus versus severe stenosis at the right MCA bifurcation.  Occlusion of the left vertebral artery at its origin.  Patient did receive tPA.  Echocardiogram with  ejection fraction of 60 to 65% no wall motion abnormalities grade 1 diastolic dysfunction.  MRI/MRA showed multifocal areas of right MCA territory infarction including a new area in the right perirolandic region.  Few small foci of susceptibility artifact within the areas of restricted diffusion.  Stable trace right subarachnoid hemorrhage.  She was transferred to [facility] underwent four-vessel cerebral arteriogram with revascularization of occluded right ICA 08/14/2020 per  interventional radiology.  Hospital course complicated by post arteriogram right groin pain findings of right femoral pseudoaneurysm underwent repair of right femoral pseudoaneurysm 08/15/2020.  Hospital course TEE showed normal ejection fraction possible PFO TCD study showed [name] degree 2 with Valsalva.  Hyper coag labs negative.  Recommendations 30-day cardiac event monitor.  Maintained on aspirin 81 mg daily as well as Brilinta for CVA prophylaxis.  Subcutaneous Lovenox for DVT prophylaxis.  Therapy evaluations completed due to patient's left-sided weakness was admitted for a comprehensive rehab program.   Hospital Course: Idonia Zollinger was admitted to rehab 08/22/2020 for inpatient therapies to consist of PT, ST and OT at least three hours five days a week. Past admission physiatrist, therapy team and rehab RN have worked together to provide customized collaborative inpatient rehab.  Pertain to patient's right MCA infarction due to right ICA occlusion status post tPA complicated by small SAH with later right ICA stenting complicated by right femoral artery pseudoaneurysm with repair 08/15/2020 per vascular surgery.  She tolerated aspirin and Brilinta for CVA prophylaxis.  Subcutaneous Lovenox for DVT prophylaxis.  Pain managed with use of tramadol.  Mood stabilization with Paxil.  Her blood pressure were controlled monitoring for orthostasis.  Lipitor ongoing for hyperlipidemia.  Protonix for GERD.  Thyroid replacement  for hypothyroidism.  Obesity with BMI 29.57 dietary follow-up   Blood pressures were monitored on TID basis and controlled     Rehab course: During patient's stay in rehab weekly team conferences were held to monitor patient's progress, set goals and discuss barriers to discharge. At admission, patient required minimal assist stand pivot transfers minimal guard 300 feet without assistive device  Physical exam blood pressure 158/76 pulse 60 temperature 99 respirations 18 oxygen saturations 98% room air Constitutional.  No acute distress HEENT Head.  Normocephalic and atraumatic Eyes.  Pupils round and reactive to light no discharge.nystagmus Neck.  Supple nontender no JVD without thyromegaly Cardiac regular rate rhythm not extra sounds or murmur heard Abdomen.  Soft nontender positive bowel sounds without rebound Respiratory effort normal no respiratory distress without wheeze Skin.  Warm and dry Neurologic.  Alert oriented.  Makes good eye contact with examiner. Motor.  Left upper left lower extremity 4+/5 proximal distal Right upper/right lower extremity 5/5 proximal distal Sensation intact light touch Mild ataxia left upper extremity  He/She  has had improvement in activity tolerance, balance, postural control as well as ability to compensate for deficits. He/She has had improvement in functional use RUE/LUE  and RLE/LLE as well as improvement in awareness.  Patient with excellent overall progress.  STS throughout sessions contact-guard close supervision.  Stair climbing contact-guard assist.  Navigate through cones minimal assistance.  Standby assist grooming contact-guard lower body dressing.  SLP facilitated sessions by providing minimal assist progressing to supervision assist verbal cues for planning organization and problem solving.  Full family teaching completed plan discharged home       Disposition: Discharged to home    Diet: Regular  Special Instructions: No  driving smoking or alcohol  Medications at discharge. 1.  Tylenol as needed 2.  Aspirin 81 mg p.o. daily 3.  Lipitor 80 mg p.o. daily 4.  Synthroid 50 mcg p.o. daily 5.  Lisinopril 5 mg p.o. daily 6.  Protonix 40 mg p.o. daily 7.  Paxil 10 mg p.o. daily 8.  Brilinta 90 mg p.o. twice daily 9.  Tramadol 50 mg every 6 hours as needed pain  30-35 minutes were spent completing discharge summary and  discharge planning  Discharge Instructions     Ambulatory referral to Neurology   Complete by: As directed    An appointment is requested in approximately: 4 weeks right MCA infarction   Ambulatory referral to Physical Medicine Rehab   Complete by: As directed    Moderate complexity follow-up 1 to 2 weeks right MCA infarction        Follow-up Information     Kirsteins, Victorino Sparrow, MD Follow up.   Specialty: Physical Medicine and Rehabilitation Why: Office to call for appointment Contact information: [address] [place] [address] [phone]         Nada Libman, MD Follow up.   Specialties: Vascular Surgery, Cardiology Why: Call for appointment Contact information: [address] [place] [address] [phone]         Julieanne Cotton, MD Follow up.   Specialties: Interventional Radiology, Radiology Why: Call for appointment Contact information: [address] [place] [address] [phone]                 Signed: Charlton Amor 08/29/2020, 5:45 AM

## 2020-08-29 LAB — CREATININE, SERUM
Creatinine, Ser: 0.64 mg/dL (ref 0.44–1.00)
GFR, Estimated: >60 mL/min (ref >60)

## 2020-08-29 MED ORDER — TRAMADOL HCL 50 MG PO TABS: 50.0000 mg | ORAL_TABLET | Freq: Four times a day (QID) | ORAL | 0 refills | Status: DC | PRN

## 2020-08-29 MED ORDER — ATORVASTATIN CALCIUM 80 MG PO TABS: 80.0000 mg | ORAL_TABLET | Freq: Every day | ORAL | 0 refills | Status: DC

## 2020-08-29 MED ORDER — PAROXETINE HCL 10 MG PO TABS: 10.0000 mg | ORAL_TABLET | Freq: Every day | ORAL | 0 refills | Status: DC

## 2020-08-29 MED ORDER — LISINOPRIL 5 MG PO TABS: 5.0000 mg | ORAL_TABLET | Freq: Every day | ORAL | 0 refills | Status: DC

## 2020-08-29 MED ORDER — PANTOPRAZOLE SODIUM 40 MG PO TBEC: 40.0000 mg | DELAYED_RELEASE_TABLET | Freq: Every day | ORAL | 0 refills | Status: DC

## 2020-08-29 MED ORDER — SENNOSIDES-DOCUSATE SODIUM 8.6-50 MG PO TABS: 1.0000 | ORAL_TABLET | Freq: Two times a day (BID) | ORAL | Status: DC

## 2020-08-29 MED ORDER — TICAGRELOR 90 MG PO TABS: 90.0000 mg | ORAL_TABLET | Freq: Two times a day (BID) | ORAL | 0 refills | Status: DC

## 2020-08-29 MED ORDER — LEVOTHYROXINE SODIUM 50 MCG PO TABS: 50.0000 ug | ORAL_TABLET | Freq: Every morning | ORAL | 0 refills | Status: DC

## 2020-08-29 NOTE — Patient Care Conference (Signed)
Inpatient RehabilitationTeam Conference and Plan of Care Update Date: 08/29/2020   Time:09:50 AM   Patient Name: Vicki Gonzales      Medical Record Number: [id number]  Date of Birth: 1965/07/23 Sex: Female         Room/Bed: 5C07C/5C07C-01 Payor Info: Payor: BLUE CROSS BLUE SHIELD / Plan: BCBS COMM PPO / Product Type: *No Product type* /    Admit Date/Time:  08/22/2020  3:29 PM  Primary Diagnosis:  Right middle cerebral artery stroke [facility])  Hospital Problems: Principal Problem:   Right middle cerebral artery stroke [facility])    Expected Discharge Date: Expected Discharge Date: 08/29/20  Team Members Present: Physician leading conference: Dr. Sula Soda Social Worker Present: Cecile Sheerer, LCSWA Nurse Present: Chana Bode, RN PT Present: Grier Rocher, PT OT Present: Annye English, OT PPS Coordinator present : Fae Pippin, SLP     Current Status/Progress Goal Weekly Team Focus  Bowel/Bladder             Swallow/Nutrition/ Hydration             ADL's             Mobility   Mod I with rollator and no AD  Mod I ambulatory  family training, balance safety   Communication             Safety/Cognition/ Behavioral Observations  mod I-to-sup A  mod I-to-sup A  discharging 8/24.   Pain             Skin               Discharge Planning:  HOme with husband who is in and out for his job, will be alone at times.   Team Discussion: Made good progress. Medically stable per MD and ready for discharge to home. Patient on target to meet rehab goals: yes  *See Care Plan and progress notes for long and short-term goals.   Revisions to Treatment Plan:    Teaching Needs: Safety, medications, secondary stroke risk management and dietary modifications, transfers, etc  Current Barriers to Discharge: Decreased caregiver support and Home enviroment access/layout  Possible Resolutions to Barriers: Family education with spouse     Medical  Summary Current Status: hypokalemia, hypothyroidism, GERD  Barriers to Discharge: Medical stability  Barriers to Discharge Comments: hypokalemia, hypothyroidism, GERD Possible Resolutions to [employer] Focus: resolved, continue synthroid, continue protonix   Continued Need for Acute Rehabilitation Level of Care: The patient requires daily medical management by a physician with specialized training in physical medicine and rehabilitation for the following reasons: Direction of a multidisciplinary physical rehabilitation program to maximize functional independence : Yes Medical management of patient stability for increased activity during participation in an intensive rehabilitation regime.: Yes Analysis of laboratory values and/or radiology reports with any subsequent need for medication adjustment and/or medical intervention. : Yes   I attest that I was present, lead the team conference, and concur with the assessment and plan of the team.   Chana Bode B 08/29/2020, 2:12 PM

## 2020-08-29 NOTE — Progress Notes (Signed)
Physical Therapy Discharge Summary  Patient Details  Name: Vicki Gonzales MRN: [id number] Date of Birth: 1965-10-14  Today's Date: 08/28/2020 PT Individual Time:  4627-0350  PT Individual Time Calculation (min): 73 min     Patient has met 9 of 9 long term goals due to improved activity tolerance, improved balance, increased strength, functional use of  left upper extremity and left lower extremity, improved attention, and improved coordination.  Patient to discharge at an ambulatory level Modified Independent.   Patient's care partner is independent to provide the necessary physical assistance at discharge.  Reasons goals not met: n/a  Recommendation:  Patient will benefit from ongoing skilled PT services in outpatient setting to continue to advance safe functional mobility, address ongoing impairments in  coordination, balance, cognition, safety awareness, and to minimize fall risk.  Equipment: RW  Reasons for discharge: treatment goals met and discharge from hospital  Patient/family agrees with progress made and goals achieved: Yes  PT Discharge Precautions/Restrictions  Precautions Precautions: Fall Precaution Comments: mild L inattention Restrictions Weight Bearing Restrictions: No   Pain Pain Assessment Pain Scale: 0-10 Pain Score: 0-No pain Pain Interference Pain Effect on Sleep: 0. Does not apply - I have not had any pain or hurting in the past 5 days Pain Interference with Therapy Activities: 0. Does not apply - I have not received rehabilitation/ therapy in the past 5 days Pain Interference with Day-to-Day Activities: 1. Rarely or not at all Vision/Perception  Baseline Vision/History: 0 No visual deficits Patient Visual Report: No change from baseline Vision Assessment?: Yes Visual Fields: Left visual field deficit Perception  Perception: Impaired Inattention/Neglect: Does not attend to left visual field (mild) Praxis Praxis: Intact    Cognition Overall Cognitive Status: Impaired/Different from baseline Arousal/Alertness: Awake/alert Orientation Level: Oriented X4 Awareness: Impaired Awareness Impairment: Emergent Impairment Behaviors: Impulsive Safety/Judgment: Impaired Comments: Pt able to ind verbalize safety precautions, cont to req minimal cuing at times for safe rollater use.  Sensation Sensation Light Touch: Appears Intact Proprioception Impaired Details: Impaired LLE Coordination Gross Motor Movements are Fluid and Coordinated: No Fine Motor Movements are Fluid and Coordinated: No Coordination and Movement Description: mild L hemi Heel Shin Test: mild ataxia on the L especially with fatigue and with fine motor/ controlled target finding Motor  Motor Motor: Hemipareisis Motor - Discharge Observations: mild L sided hemipareisis Mobility  Bed Mobility Bed Mobility: Supine to Sit, Sit to Supine Supine to Sit: Independent Sit to Supine: independent Transfers Sit to Stand: Independent Stand to Sit: Independent Stand Pivot Transfers: Independent with assistive device Transfer Programmer, multimedia): Rollator Locomotion  Gait Ambulation: Yes Gait Assistance: Independent with assistive device Gait Distance (Feet): 300 Feet Assistive Device: Rollator Gait Assistance Details: Verbal cues for precautions/safety Gait Assistance Details: intermittent vc for brake application of rollator Gait: Yes Gait Pattern: Decreased hip/knee flexion - left Gait velocity: decreased Stairs: Yes Stairs Assistance: Independent with assistive device; Supervision/ verbal cueing Stair Management Technique: One rail Right Number of Stairs: 12 Height of Stairs: 6 Ramp: Independent Curb: Supervision/ verbal cueing Wheelchair Mobility: No Trunk/Postural Assessment  Cervical Assessment Cervical Assessment: Within Functional Limits Thoracic Assessment Thoracic Assessment: Exceptions to [facility] (mildly rounded shoulders) Lumbar  Assessment Lumbar Assessment: Within Functional Limits Postural Control Postural Control: Within Functional Limits  Balance Balance Balance Assessed: Yes Standardized Balance Assessment Standardized Balance Assessment: Dynamic Gait Index;Berg Balance Test Dynamic Gait Index Level Surface: Mild Impairment Change in Gait Speed: Mild Impairment Gait with Horizontal Head Turns: Mild Impairment Gait with Vertical  Head Turns: Normal Gait and Pivot Turn: Mild Impairment Step Over Obstacle: Mild Impairment Step Around Obstacles: Mild Impairment Steps: Normal Total Score: 18 Berg Balance Test Sit to Stand: Able to stand  without using hands and stabilize independently Standing Unsupported: Able to stand safely 2 minutes Sitting with Back Unsupported but Feet Supported on Floor or Stool: Able to sit safely and securely 2 minutes Stand to Sit: Sits safely with minimal use of hands Transfers: Able to transfer safely, minor use of hands Standing Unsupported with Eyes Closed: Able to stand 10 seconds with supervision Standing Ubsupported with Feet Together: Able to place feet together independently and stand for 1 minute safely From Standing, Reach Forward with Outstretched Arm: Can reach confidently >25 cm (10") From Standing Position, Pick up Object from Floor: Able to pick up shoe, needs supervision From Standing Position, Turn to Look Behind Over each Shoulder: Looks behind from both sides and weight shifts well Turn 360 Degrees: Able to turn 360 degrees safely but slowly Standing Unsupported, Alternately Place Feet on Step/Stool: Able to stand independently and complete 8 steps >20 seconds Standing Unsupported, One Foot in Front: Able to plae foot ahead of the other independently and hold 5-10 seconds Standing on One Leg: Able to lift leg independently and hold 5-10 seconds Total Score: 49 Static Sitting Balance Static Sitting - Level of Assistance: 7: Independent Dynamic Sitting  Balance Dynamic Sitting - Balance Support: Feet supported;During functional activity Dynamic Sitting - Level of Assistance: 6: Modified independent (Device/Increase time) Static Standing Balance Static Standing - Balance Support: During functional activity;Bilateral upper extremity supported Static Standing - Level of Assistance: 6: Modified independent (Device/Increase time) Dynamic Standing Balance Dynamic Standing - Balance Support: During functional activity;Bilateral upper extremity supported Dynamic Standing - Level of Assistance: 6: Modified independent (Device/Increase time)    Extremity Assessment  RUE Assessment RUE Assessment: Within Functional Limits LUE Assessment LUE Assessment: Exceptions to [facility] Active Range of Motion (AROM) Comments: 3/4 to full shoulder flexion General Strength Comments: 4-/5 in shoulder flexion     Alger Simons PT, DPT 08/28/2020, 3:46 PM

## 2020-08-29 NOTE — Progress Notes (Addendum)
Patient ID: Vicki Gonzales, female   DOB: 11/16/1965, 54 y.o.   MRN: [id number] Met with pt and husband who informed worker she is going home today according to MD who rounded on her yesterday. Discussed equipment needs she has all needed equipment. Team recommends OP will fax referral to OP neuro and will ask them to call husband to set up appointments. 

## 2020-08-29 NOTE — Progress Notes (Signed)
INPATIENT REHABILITATION DISCHARGE NOTE   Discharge instructions by: Jesusita Oka, PA  Verbalized understanding: yes  Skin care/Wound care: n/a  Pain: none  IV's: n/a  Tubes/Drains: n/a  Safety instructions: explained  Patient belongings: taken home with husband  Discharged to: home  Discharged via: wheelchair on private transport  Notes: All questions answered.

## 2020-08-29 NOTE — Plan of Care (Signed)
  Problem: RH Balance Goal: LTG Patient will maintain dynamic standing balance (PT) Description: LTG:  Patient will maintain dynamic standing balance with assistance during mobility activities (PT) Outcome: Completed/Met Flowsheets (Taken 08/29/2020 0837) LTG: Pt will maintain dynamic standing balance during mobility activities with:: Independent with assistive device    Problem: RH Bed Mobility Goal: LTG Patient will perform bed mobility with assist (PT) Description: LTG: Patient will perform bed mobility with assistance, with/without cues (PT). Outcome: Completed/Met Flowsheets (Taken 08/29/2020 0837) LTG: Pt will perform bed mobility with assistance level of: Independent   Problem: RH Bed to Chair Transfers Goal: LTG Patient will perform bed/chair transfers w/assist (PT) Description: LTG: Patient will perform bed to chair transfers with assistance (PT). Outcome: Completed/Met Flowsheets (Taken 08/29/2020 0837) LTG: Pt will perform Bed to Chair Transfers with assistance level: Independent with assistive device    Problem: RH Car Transfers Goal: LTG Patient will perform car transfers with assist (PT) Description: LTG: Patient will perform car transfers with assistance (PT). Outcome: Completed/Met Flowsheets (Taken 08/29/2020 0837) LTG: Pt will perform car transfers with assist:: Independent with assistive device    Problem: RH Floor Transfers Goal: LTG Patient will perform floor transfers w/assist (PT) Description: LTG: Patient will perform floor transfers with assistance (PT). Outcome: Completed/Met Flowsheets (Taken 08/29/2020 0837) LTG: PT WILL PERFORM FLOOR TRANFERS  WITH  ASSIST:: Minimal Assistance - Patient > 75%   Problem: RH Ambulation Goal: LTG Patient will ambulate in controlled environment (PT) Description: LTG: Patient will ambulate in a controlled environment, # of feet with assistance (PT). Outcome: Completed/Met Flowsheets Taken 08/29/2020 3887 by Alger Simons,  PT LTG: Pt will ambulate in controlled environ  assist needed:: Independent with assistive device Taken 08/23/2020 1615 by Lorie Phenix, PT LTG: Ambulation distance in controlled environment: 125ft with LRAD Goal: LTG Patient will ambulate in home environment (PT) Description: LTG: Patient will ambulate in home environment, # of feet with assistance (PT). Outcome: Completed/Met Flowsheets Taken 08/29/2020 1959 by Alger Simons, PT LTG: Pt will ambulate in home environ  assist needed:: Independent with assistive device Taken 08/23/2020 1615 by Lorie Phenix, PT LTG: Ambulation distance in home environment: 62ft with LRAD Goal: LTG Patient will ambulate in community environment (PT) Description: LTG: Patient will ambulate in community environment, # of feet with assistance (PT). Outcome: Completed/Met Flowsheets (Taken 08/23/2020 1615 by Lorie Phenix, PT) LTG: Pt will ambulate in community environ  assist needed:: Supervision/Verbal cueing LTG: Ambulation distance in community environment: 349ft with LRAD   Problem: RH Stairs Goal: LTG Patient will ambulate up and down stairs w/assist (PT) Description: LTG: Patient will ambulate up and down # of stairs with assistance (PT) Outcome: Completed/Met Flowsheets Taken 08/29/2020 0837 by Alger Simons, PT LTG: Pt will ambulate up/down stairs assist needed:: Supervision/Verbal cueing Taken 08/23/2020 1615 by Lorie Phenix, PT LTG: Pt will  ambulate up and down number of stairs: 12 steps with BUE support

## 2020-08-29 NOTE — Progress Notes (Signed)
PROGRESS NOTE   Subjective/Complaints: No new issues today. Feels well and prepared to go home today!  ROS: Patient denies fever, rash, sore throat, blurred vision, nausea, vomiting, diarrhea, cough, shortness of breath or chest pain, joint or back pain, headache, or mood change.    Objective:   No results found. No results for input(s): WBC, HGB, HCT, PLT in the last 72 hours.  Recent Labs    08/29/20 0551  CREATININE 0.64    Intake/Output Summary (Last 24 hours) at 08/29/2020 0849 Last data filed at 08/28/2020 1851 Gross per 24 hour  Intake 594 ml  Output --  Net 594 ml        Physical Exam: Vital Signs Blood pressure (!) 153/88, pulse 67, temperature 97.8 F (36.6 C), temperature source Oral, resp. rate 14, height 5\' 6"  (1.676 m), weight 83.1 kg, last menstrual period 04/07/2019, SpO2 97 %.   Constitutional: No distress . Vital signs reviewed. HEENT: NCAT, EOMI, oral membranes moist Neck: supple Cardiovascular: RRR without murmur. No JVD    Respiratory/Chest: CTA Bilaterally without wheezes or rales. Normal effort    GI/Abdomen: BS +, non-tender, non-distended Ext: no clubbing, cyanosis, or edema Psych: pleasant and cooperative  Skin: No evidence of breakdown, no evidence of rash Neurologic: alert and oriented, no dysarthria, mildly reduced sensation LLE Musculoskeletal: Full range of motion in all 4 extremities. No joint swelling Motor strength 4+/5 on left and 5/5 on right--stable   Assessment/Plan: 1. Functional deficits which require 3+ hours per day of interdisciplinary therapy in a comprehensive inpatient rehab setting. Physiatrist is providing close team supervision and 24 hour management of active medical problems listed below. Physiatrist and rehab team continue to assess barriers to discharge/monitor patient progress toward functional and medical goals  Care Tool:  Bathing    Body parts  bathed by patient: Face, Left lower leg, Right arm, Left arm, Chest, Abdomen, Front perineal area, Buttocks, Right upper leg, Right lower leg, Left upper leg         Bathing assist Assist Level: Supervision/Verbal cueing     Upper Body Dressing/Undressing Upper body dressing   What is the patient wearing?: Bra, Pull over shirt    Upper body assist Assist Level: Independent with assistive device    Lower Body Dressing/Undressing Lower body dressing      What is the patient wearing?: Pants, Underwear/pull up     Lower body assist Assist for lower body dressing: Independent with assitive device     Toileting Toileting    Toileting assist Assist for toileting: Independent with assistive device     Transfers Chair/bed transfer  Transfers assist     Chair/bed transfer assist level: Contact Guard/Touching assist     Locomotion Ambulation   Ambulation assist      Assist level: Minimal Assistance - Patient > 75%   Max distance: 150   Walk 10 feet activity   Assist     Assist level: Minimal Assistance - Patient > 75%     Walk 50 feet activity   Assist    Assist level: Minimal Assistance - Patient > 75%      Walk 150 feet activity   Assist  Assist level: Minimal Assistance - Patient > 75%      Walk 10 feet on uneven surface  activity   Assist     Assist level: Minimal Assistance - Patient > 75%     Wheelchair     Assist Is the patient using a wheelchair?: No Type of Wheelchair: Manual    Wheelchair assist level: Minimal Assistance - Patient > 75% Max wheelchair distance: 150    Wheelchair 50 feet with 2 turns activity    Assist        Assist Level: Minimal Assistance - Patient > 75%   Wheelchair 150 feet activity     Assist      Assist Level: Minimal Assistance - Patient > 75%   Blood pressure (!) 153/88, pulse 67, temperature 97.8 F (36.6 C), temperature source Oral, resp. rate 14, height 5\' 6"  (1.676  m), weight 83.1 kg, last menstrual period 04/07/2019, SpO2 97 %.  Medical Problem List and Plan: 1.  Left-sided weakness secondary to right MCA infarct due to right ICA occlusion status post tPA complicated by small SAH with later right ICA stenting complicated by right femoral artery pseudoaneurysm with repair 08/15/2020 per vascular surgery             -patient may shower             -dc home today -f/u with [facility], primary, neuro           2.  Antithrombotics: -DVT/anticoagulation: Lovenox Mechanical: Sequential compression devices, entire leg Bilateral lower extremities Pharmaceutical: Lovenox--dc             -antiplatelet therapy: Aspirin 81 mg daily and Brilinta 90 mg twice daily 3. Pain Management: Tramadol as needed 4. Mood: Paxil 10 mg daily             -antipsychotic agents: N/A 5. Neuropsych: This patient is capable of making decisions on her own behalf. 6. Skin/Wound Care: Routine skin checks 7. Fluids/Electrolytes/Nutrition: Routine in and outs, good intake for fluids             CMP unremarkable 8.  Hyperlipidemia: Lipitor 9.  GERD.  Protonix 10.  Hypothyroidism: Synthroid 11.  Hypokalemia             resolved 3.7 on 8/18    LOS: 7 days A FACE TO FACE EVALUATION WAS PERFORMED  9/18 08/29/2020, 8:49 AM

## 2020-08-29 NOTE — Progress Notes (Signed)
Inpatient Rehabilitation Care Coordinator Discharge Note   Patient Details  Name: Vicki Gonzales MRN: [id number] Date of Birth: Mar 16, 1965   Discharge location: Home with husband who is in and out  Length of Stay: 7 days  Discharge activity level: mod/i-supervision level  Home/community participation: yes  Patient response [id number] Literacy - How often do you need to have someone help you when you read instructions, pamphlets, or other written material from your doctor or pharmacy?: Never  Patient response HA:[id number] Isolation - How often do you feel lonely or isolated from those around you?: Rarely  Services provided included: MD, RD, PT, OT, SLP, RN, CM, Pharmacy, SW  Financial Services:  Financial Services Utilized: [employer]  Choices offered to/list presented to: pt and husband  Follow-up services arranged:  Outpatient    Outpatient Servicies: [facility]-PT,OT SP will call husband to set up appointments      Patient response to transportation need: Is the patient able to respond to transportation needs?: Yes In the past 12 months, has lack of transportation kept you from medical appointments or from getting medications?: No In the past 12 months, has lack of transportation kept you from meetings, work, or from getting things needed for daily living?: No    Comments (or additional information):Husband was here daily and participated in therapies with pt, aware of needs at discharge. Both happy to be going home and with pt's recovery  Patient/Family verbalized understanding of follow-up arrangements:  Yes  Individual responsible for coordination of the follow-up plan: Stephen-husband [phone]  Confirmed correct DME delivered: [name] 08/29/2020    Greggory Safranek, Lemar Livings

## 2020-09-11 ENCOUNTER — Telehealth (HOSPITAL_COMMUNITY): Payer: Self-pay | Admitting: Cardiology

## 2020-09-11 NOTE — Telephone Encounter (Signed)
I called patient to reschedule Myoview and she does not wish to reschedule at this time. She has an appt with the Neurologist tomorrow and will discuss with him if she still needs. She will call us back if she needs. Order will be removed from the WQ and if pt calls back we will reinstate the order or create a new one. Thank you

## 2020-09-12 ENCOUNTER — Encounter
Payer: BC Managed Care – PPO | Attending: Physical Medicine and Rehabilitation | Admitting: Physical Medicine and Rehabilitation

## 2020-09-12 ENCOUNTER — Encounter: Payer: Self-pay | Admitting: Physical Medicine and Rehabilitation

## 2020-09-12 ENCOUNTER — Other Ambulatory Visit: Payer: Self-pay

## 2020-09-12 VITALS — BP 144/92 | HR 67 | Temp 98.4°F | Ht 66.0 in | Wt 172.4 lb

## 2020-09-12 DIAGNOSIS — Q211 Atrial septal defect: Secondary | ICD-10-CM | POA: Diagnosis present

## 2020-09-12 DIAGNOSIS — I63511 Cerebral infarction due to unspecified occlusion or stenosis of right middle cerebral artery: Secondary | ICD-10-CM | POA: Insufficient documentation

## 2020-09-12 DIAGNOSIS — E876 Hypokalemia: Secondary | ICD-10-CM | POA: Diagnosis present

## 2020-09-12 DIAGNOSIS — E785 Hyperlipidemia, unspecified: Secondary | ICD-10-CM | POA: Insufficient documentation

## 2020-09-12 DIAGNOSIS — E039 Hypothyroidism, unspecified: Secondary | ICD-10-CM | POA: Insufficient documentation

## 2020-09-12 DIAGNOSIS — Q2112 Patent foramen ovale: Secondary | ICD-10-CM

## 2020-09-12 DIAGNOSIS — I6521 Occlusion and stenosis of right carotid artery: Secondary | ICD-10-CM | POA: Diagnosis present

## 2020-09-12 NOTE — Patient Instructions (Signed)
RETURN TO DRIVING PLAN:  WITH THE SUPERVISION OF A LICENSED DRIVER, PLEASE DRIVE IN AN EMPTY PARKING LOT FOR AT LEAST 2-3 TRIALS TO TEST REACTION TIME, VISION, USE OF EQUIPMENT IN CAR, ETC.  IF SUCCESSFUL WITH THE PARKING LOT DRIVING, PROCEED TO SUPERVISED DRIVING TRIALS IN YOUR NEIGHBORHOOD STREETS AT LOW TRAFFIC TIMES TO TEST OBSERVATION TO TRAFFIC SIGNALS, REACTION TIME, ETC. PLEASE ATTEMPT AT LEAST 2-3 TRIALS IN YOUR NEIGHBORHOOD.  IF NEIGHBORHOOD DRIVING IS SUCCESSFUL, YOU MAY PROCEED TO DRIVING IN BUSIER AREAS IN YOUR COMMUNITY WITH SUPERVISION OF A LICENSED DRIVER. PLEASE ATTEMPT AT LEAST 4-5 TRIALS.  IF COMMUNITY DRIVING IS SUCCESSFUL, YOU MAY PROCEED TO DRIVING ALONE, DURING THE DAY TIME, IN NON-PEAK TRAFFIC TIMES.   HTN: -BP is 144/92 today.  -Advised checking BP daily at home and logging results to bring into follow-up appointment with her PCP and myself. -Reviewed BP meds today.  -Advised regarding healthy foods that can help lower blood pressure and provided with a list: 1) citrus foods- high in vitamins and minerals 2) salmon and other fatty fish - reduces inflammation and oxylipins 3) swiss chard (leafy green)- high level of nitrates 4) pumpkin seeds- one of the best natural sources of magnesium 5) Beans and lentils- high in fiber, magnesium, and potassium 6) Berries- high in flavonoids 7) Amaranth (whole grain, can be cooked similarly to rice and oats)- high in magnesium and fiber 8) Pistachios- even more effective at reducing BP than other nuts 9) Carrots- high in phenolic compounds that relax blood vessels and reduce inflammation 10) Celery- contain phthalides that relax tissues of arterial walls 11) Tomatoes- can also improve cholesterol and reduce risk of heart disease 12) Broccoli- good source of magnesium, calcium, and potassium 13) Greek yogurt: high in potassium and calcium 14) Herbs and spices: Celery seed, cilantro, saffron, lemongrass, black cumin, ginseng,  cinnamon, cardamom, sweet basil, and ginger 15) Chia and flax seeds- also help to lower cholesterol and blood sugar 16) Beets- high levels of nitrates that relax blood vessels  17) spinach and bananas- high in potassium  -Provided lise of supplements that can help with hypertension:  1) magnesium: one high quality brand is Bioptemizers since it contains all 7 types of magnesium, otherwise over the counter magnesium gluconate 400mg  is a good option 2) B vitamins 3) vitamin D 4) potassium 5) CoQ10 6) L-arginine 7) Vitamin C 8) Beetroot -Educated that goal BP is 120/80. -Made goal to incorporate some of the above foods into diet.

## 2020-09-12 NOTE — Progress Notes (Signed)
Subjective:    Patient ID: Vicki Gonzales, female    DOB: 1965/02/07, 55 y.o.   MRN: [id number]  HPI Vicki Gonzales is a 55 year old woman who presents for hospital follow-up after CIR admission for MCA stroke.  Getting around well, going up and down stairs with more confidence, waking clothes, made scrambled egges for herself. Her only concern is driving.   No longer has "the blankness" on the left side.   Her husband is very supportive.   He used to be a lunch lady for 22 years. She resigned in April 29th. She hopes to find another job  She starts therapy next Wednesday.   She takes Tramadol at night for her herniated disc- has done so chronically   Pain Inventory Average Pain 0 Pain Right Now 0 My pain is  none  LOCATION OF PAIN  back  BOWEL Number of stools per week: normal  BLADDER Normal   Mobility walk with assistance use a walker ability to climb steps?  yes  Function not employed: date last employed .  Neuro/Psych trouble walking  Prior Studies Transitions of care  Physicians involved in your care Transitions of care   History reviewed. No pertinent family history. Social History   Socioeconomic History   Marital status: Married    Spouse name: Not on file   Number of children: Not on file   Years of education: Not on file   Highest education level: Not on file  Occupational History   Not on file  Tobacco Use   Smoking status: Former   Smokeless tobacco: Never  Substance and Sexual Activity   Alcohol use: Not on file   Drug use: Not on file   Sexual activity: Not on file  Other Topics Concern   Not on file  Social History Narrative   Not on file   Social Determinants of Health   Financial Resource Strain: Not on file  Food Insecurity: Not on file  Transportation Needs: Not on file  Physical Activity: Not on file  Stress: Not on file  Social Connections: Not on file   Past Surgical History:  Procedure Laterality  Date   ANTERIOR CRUCIATE LIGAMENT REPAIR Left 2021   APPENDECTOMY     BUBBLE STUDY  08/17/2020   Procedure: BUBBLE STUDY;  Surgeon: Jodelle Red, MD;  Location: [facility] ENDOSCOPY;  Service: Cardiovascular;;   FALSE ANEURYSM REPAIR Right 08/15/2020   Procedure: REPAIR RIGHT FEMORAL PSEUDOANEURYSM;  Surgeon: Nada Libman, MD;  Location: [facility] OR;  Service: Vascular;  Laterality: Right;   IR ANGIO INTRA EXTRACRAN SEL COM CAROTID INNOMINATE UNI L MOD SED  08/14/2020   IR ANGIO INTRA EXTRACRAN SEL INTERNAL CAROTID UNI R MOD SED  08/14/2020   IR ANGIO VERTEBRAL SEL SUBCLAVIAN INNOMINATE BILAT MOD SED  08/14/2020   IR CT HEAD LTD  08/14/2020   IR CT HEAD LTD  08/14/2020   IR INTRA CRAN STENT  08/14/2020   IR INTRAVSC STENT CERV CAROTID W/O EMB-PROT MOD SED INC ANGIO  08/14/2020   RADIOLOGY WITH ANESTHESIA N/A 08/14/2020   Procedure: IR WITH ANESTHESIA;  Surgeon: Julieanne Cotton, MD;  Location: [facility] OR;  Service: Radiology;  Laterality: N/A;   TEE WITHOUT CARDIOVERSION N/A 08/17/2020   Procedure: TRANSESOPHAGEAL ECHOCARDIOGRAM (TEE);  Surgeon: Jodelle Red, MD;  Location: [facility] ENDOSCOPY;  Service: Cardiovascular;  Laterality: N/A;   TUBAL LIGATION     Past Medical History:  Diagnosis Date   ADHD  Anxiety    Carpal tunnel syndrome on right    Depression    HTN (hypertension)    Hypothyroidism    Migraines    Spondylosis    BP (!) 144/92   Pulse 67   Temp 98.4 F (36.9 C)   Ht 5\' 6"  (1.676 m)   Wt 172 lb 6.4 oz (78.2 kg)   LMP 04/07/2019 (Approximate)   SpO2 97%   BMI 27.83 kg/m   Opioid Risk Score:   Fall Risk Score:  `1  Depression screen PHQ 2/9  No flowsheet data found.  Review of Systems  Constitutional: Negative.   HENT: Negative.    Eyes: Negative.   Respiratory: Negative.    Cardiovascular: Negative.   Gastrointestinal: Negative.   Genitourinary: Negative.   Musculoskeletal:  Positive for back pain and gait problem.  Skin: Negative.   Allergic/Immunologic:  Negative.   Neurological:  Positive for numbness.  Hematological: Negative.   Psychiatric/Behavioral: Negative.    All other systems reviewed and are negative.     Objective:   Physical Exam Gen: no distress, normal appearing, BP 144/92 HEENT: oral mucosa pink and moist, NCAT, EOMI intact Cardio: Reg rate Chest: normal effort, normal rate of breathing Abd: soft, non-distended Ext: no edema Psych: pleasant, normal affect Skin: intact Neuro: Alert and oriented x3 Musculoskeletal: 5/5 strength throughout. Ambulating with RW.        Assessment & Plan:   Vicki Gonzales is a very pleasant woman who presents for [facility] after CIR admission for MCA CVA.   1) Impaired mobility 2/2 MCA CVA -RETURN TO DRIVING PLAN:  WITH THE SUPERVISION OF A LICENSED DRIVER, PLEASE DRIVE IN AN EMPTY PARKING LOT FOR AT LEAST 2-3 TRIALS TO TEST REACTION TIME, VISION, USE OF EQUIPMENT IN CAR, ETC.  IF SUCCESSFUL WITH THE PARKING LOT DRIVING, PROCEED TO SUPERVISED DRIVING TRIALS IN YOUR NEIGHBORHOOD STREETS AT LOW TRAFFIC TIMES TO TEST OBSERVATION TO TRAFFIC SIGNALS, REACTION TIME, ETC. PLEASE ATTEMPT AT LEAST 2-3 TRIALS IN YOUR NEIGHBORHOOD.  IF NEIGHBORHOOD DRIVING IS SUCCESSFUL, YOU MAY PROCEED TO DRIVING IN BUSIER AREAS IN YOUR COMMUNITY WITH SUPERVISION OF A LICENSED DRIVER. PLEASE ATTEMPT AT LEAST 4-5 TRIALS.  IF COMMUNITY DRIVING IS SUCCESSFUL, YOU MAY PROCEED TO DRIVING ALONE, DURING THE DAY TIME, IN NON-PEAK TRAFFIC TIMES.   Start outpatient therapy this week.  Continue RW for balance deficits  Reviewed all medications and she does not need refills  2) HTN: -BP is 144/92 today.  -Advised checking BP daily at home and logging results to bring into follow-up appointment with her PCP and myself. -Reviewed BP meds today.  -Advised regarding healthy foods that can help lower blood pressure and provided with a list: 1) citrus foods- high in vitamins and minerals 2) salmon and other fatty fish -  reduces inflammation and oxylipins 3) swiss chard (leafy green)- high level of nitrates 4) pumpkin seeds- one of the best natural sources of magnesium 5) Beans and lentils- high in fiber, magnesium, and potassium 6) Berries- high in flavonoids 7) Amaranth (whole grain, can be cooked similarly to rice and oats)- high in magnesium and fiber 8) Pistachios- even more effective at reducing BP than other nuts 9) Carrots- high in phenolic compounds that relax blood vessels and reduce inflammation 10) Celery- contain phthalides that relax tissues of arterial walls 11) Tomatoes- can also improve cholesterol and reduce risk of heart disease 12) Broccoli- good source of magnesium, calcium, and potassium 13) Greek yogurt: high in potassium and calcium  14) Herbs and spices: Celery seed, cilantro, saffron, lemongrass, black cumin, ginseng, cinnamon, cardamom, sweet basil, and ginger 15) Chia and flax seeds- also help to lower cholesterol and blood sugar 16) Beets- high levels of nitrates that relax blood vessels  17) spinach and bananas- high in potassium  -Provided lise of supplements that can help with hypertension:  1) magnesium: one high quality brand is Bioptemizers since it contains all 7 types of magnesium, otherwise over the counter magnesium gluconate 400mg  is a good option 2) B vitamins 3) vitamin D 4) potassium 5) CoQ10 6) L-arginine 7) Vitamin C 8) Beetroot -Educated that goal BP is 120/80. -Made goal to incorporate some of the above foods into diet.    3) Chronic back pain -provided with a pain relief journal -continue tramadol  Current impairments: Impaired gait requiring RW  Patient will require home OT and PT; orders have been placed  The patient's medical and/or psychosocial problems require moderate decision-making during transitions in care from inpatient rehabilitation to home.  This was a face-to-face visit with me within 7-14 calendar days of the patient's discharge. This  transitional care appointment included review of the patient's hospital discharge summary, review of the patient's hospital diagnostic tests and discussion of appropriate follow-up, education of the patient regarding their condition, re-establishment of necessary referrals. I will be reviewing patient's home and/or outpatient therapy notes as they progress through therapy and corresponding with therapists accordingly. I have encouraged compliance with current medication regimen (with adjustment to regimen as needed), follow-up with necessary providers, and the importance of following a healthy diet and exercise routine to maximize recovery, health, and quality of life.

## 2020-09-14 ENCOUNTER — Encounter (HOSPITAL_COMMUNITY): Payer: Self-pay

## 2020-09-14 ENCOUNTER — Emergency Department (HOSPITAL_COMMUNITY): Payer: BC Managed Care – PPO

## 2020-09-14 ENCOUNTER — Emergency Department (HOSPITAL_COMMUNITY)
Admission: EM | Admit: 2020-09-14 | Discharge: 2020-09-14 | Disposition: A | Discharge status: Home / Self Care | Payer: BC Managed Care – PPO | Attending: Emergency Medicine | Admitting: Emergency Medicine

## 2020-09-14 ENCOUNTER — Other Ambulatory Visit: Payer: Self-pay

## 2020-09-14 DIAGNOSIS — I10 Essential (primary) hypertension: Secondary | ICD-10-CM | POA: Insufficient documentation

## 2020-09-14 DIAGNOSIS — E039 Hypothyroidism, unspecified: Secondary | ICD-10-CM | POA: Diagnosis not present

## 2020-09-14 DIAGNOSIS — Z87891 Personal history of nicotine dependence: Secondary | ICD-10-CM | POA: Insufficient documentation

## 2020-09-14 DIAGNOSIS — Z79899 Other long term (current) drug therapy: Secondary | ICD-10-CM | POA: Diagnosis not present

## 2020-09-14 DIAGNOSIS — Z7982 Long term (current) use of aspirin: Secondary | ICD-10-CM | POA: Diagnosis not present

## 2020-09-14 DIAGNOSIS — R112 Nausea with vomiting, unspecified: Secondary | ICD-10-CM | POA: Insufficient documentation

## 2020-09-14 DIAGNOSIS — R42 Dizziness and giddiness: Secondary | ICD-10-CM | POA: Insufficient documentation

## 2020-09-14 HISTORY — DX: Cerebral infarction, unspecified: I63.9

## 2020-09-14 LAB — CBC
HCT: 37.4 % (ref 36.0–46.0)
Hemoglobin: 11.8 g/dL — ABNORMAL LOW (ref 12.0–15.0)
MCH: 27.4 pg (ref 26.0–34.0)
MCHC: 31.6 g/dL (ref 30.0–36.0)
MCV: 87 fL (ref 80.0–100.0)
Platelets: 278 10*3/uL (ref 150–400)
RBC: 4.3 MIL/uL (ref 3.87–5.11)
RDW: 14.5 % (ref 11.5–15.5)
WBC: 9.4 10*3/uL (ref 4.0–10.5)
nRBC: 0 % (ref 0.0–0.2)

## 2020-09-14 LAB — COMPREHENSIVE METABOLIC PANEL
ALT: 17 U/L (ref 0–44)
AST: 26 U/L (ref 15–41)
Albumin: 3.6 g/dL (ref 3.5–5.0)
Alkaline Phosphatase: 88 U/L (ref 38–126)
Anion gap: 9 (ref 5–15)
BUN: 6 mg/dL (ref 6–20)
CO2: 28 mmol/L (ref 22–32)
Calcium: 9 mg/dL (ref 8.9–10.3)
Chloride: 104 mmol/L (ref 98–111)
Creatinine, Ser: 0.63 mg/dL (ref 0.44–1.00)
GFR, Estimated: >60 mL/min (ref >60)
Glucose, Bld: 119 mg/dL — ABNORMAL HIGH (ref 70–99)
Potassium: 3.5 mmol/L (ref 3.5–5.1)
Sodium: 141 mmol/L (ref 135–145)
Total Bilirubin: 1 mg/dL (ref 0.3–1.2)
Total Protein: 6.7 g/dL (ref 6.5–8.1)

## 2020-09-14 MED ORDER — METOCLOPRAMIDE HCL 10 MG PO TABS
10.0000 mg | ORAL_TABLET | Freq: Once | ORAL | Status: AC
  Administered 2020-09-14: 10 mg via ORAL
  Filled 2020-09-14: qty 1

## 2020-09-14 MED ORDER — ONDANSETRON HCL 4 MG/2ML IJ SOLN
4.0000 mg | Freq: Once | INTRAMUSCULAR | Status: AC
  Administered 2020-09-14: 4 mg via INTRAVENOUS
  Filled 2020-09-14: qty 2

## 2020-09-14 MED ORDER — KETOROLAC TROMETHAMINE 15 MG/ML IJ SOLN
15.0000 mg | Freq: Once | INTRAMUSCULAR | Status: AC
  Administered 2020-09-14: 15 mg via INTRAVENOUS
  Filled 2020-09-14: qty 1

## 2020-09-14 MED ORDER — SODIUM CHLORIDE 0.9 % IV BOLUS
500.0000 mL | Freq: Once | INTRAVENOUS | Status: AC
  Administered 2020-09-14: 500 mL via INTRAVENOUS

## 2020-09-14 MED ORDER — ACETAMINOPHEN 500 MG PO TABS
1000.0000 mg | ORAL_TABLET | Freq: Once | ORAL | Status: AC
  Administered 2020-09-14: 1000 mg via ORAL
  Filled 2020-09-14: qty 2

## 2020-09-14 MED ORDER — TRAMADOL HCL 50 MG PO TABS
50.0000 mg | ORAL_TABLET | Freq: Once | ORAL | Status: DC
Start: 2020-09-14 — End: 2020-09-14
  Filled 2020-09-14: qty 1

## 2020-09-14 NOTE — ED Provider Notes (Signed)
[facility] EMERGENCY DEPARTMENT Provider Note   CSN: [id number] Arrival date & time: 09/14/20  1014     History Chief Complaint  Patient presents with   Dizziness   Nausea    Vicki Gonzales is a 55 y.o. female.  Patient with hx right MCA cva last month presents with acute onset dizziness, nausea/vomiting. Pt indicates she felt unsteady and unbalanced this AM. Last felt normal last PM, had episode nv then. Spouse indicates did eat [place] food, but no one else sick, and pt does not feel is related. Pt worried about possible stroke. States had recovered from prior cva, felt well/at baseline during day yesterday. No new numbness/weakness. No change in speech or vision. Mild head pressure sensation earlier, no acute, abrupt or severe head pain. No neck pain or stiffness. No fever or chills. Emesis clear, not bloody or bilious. No abd distension. Having normal bms. No dysuria or gu c/o. Compliant w home meds.   The history is provided by the patient, a significant other, medical records and the EMS personnel.      Past Medical History:  Diagnosis Date   ADHD    Anxiety    Carpal tunnel syndrome on right    Depression    HTN (hypertension)    Hypothyroidism    Migraines    Spondylosis    Stroke [facility])     Patient Active Problem List   Diagnosis Date Noted   Right middle cerebral artery stroke (HCC) 08/22/2020   Hypokalemia    Hypothyroidism    Dyslipidemia    Patent foramen ovale    Internal carotid artery occlusion, right 08/14/2020   Acute right MCA stroke (HCC) 08/12/2020   Cerebrovascular accident (CVA) [facility])     Past Surgical History:  Procedure Laterality Date   ANTERIOR CRUCIATE LIGAMENT REPAIR Left 2021   APPENDECTOMY     BUBBLE STUDY  08/17/2020   Procedure: BUBBLE STUDY;  Surgeon: Jodelle Red, MD;  Location: [facility] ENDOSCOPY;  Service: Cardiovascular;;   FALSE ANEURYSM REPAIR Right 08/15/2020   Procedure: REPAIR RIGHT FEMORAL  PSEUDOANEURYSM;  Surgeon: Nada Libman, MD;  Location: [facility] OR;  Service: Vascular;  Laterality: Right;   IR ANGIO INTRA EXTRACRAN SEL COM CAROTID INNOMINATE UNI L MOD SED  08/14/2020   IR ANGIO INTRA EXTRACRAN SEL INTERNAL CAROTID UNI R MOD SED  08/14/2020   IR ANGIO VERTEBRAL SEL SUBCLAVIAN INNOMINATE BILAT MOD SED  08/14/2020   IR CT HEAD LTD  08/14/2020   IR CT HEAD LTD  08/14/2020   IR INTRA CRAN STENT  08/14/2020   IR INTRAVSC STENT CERV CAROTID W/O EMB-PROT MOD SED INC ANGIO  08/14/2020   RADIOLOGY WITH ANESTHESIA N/A 08/14/2020   Procedure: IR WITH ANESTHESIA;  Surgeon: Julieanne Cotton, MD;  Location: [facility] OR;  Service: Radiology;  Laterality: N/A;   TEE WITHOUT CARDIOVERSION N/A 08/17/2020   Procedure: TRANSESOPHAGEAL ECHOCARDIOGRAM (TEE);  Surgeon: Jodelle Red, MD;  Location: [facility] ENDOSCOPY;  Service: Cardiovascular;  Laterality: N/A;   TUBAL LIGATION       OB History   No obstetric history on file.     History reviewed. No pertinent family history.  Social History   Tobacco Use   Smoking status: Former   Smokeless tobacco: Never    Home Medications Prior to Admission medications   Medication Sig Start Date End Date Taking? Authorizing Provider  acetaminophen (TYLENOL) 500 MG tablet Take 500 mg by mouth every 6 (six) hours as needed for  mild pain, fever or headache.    [provider]  aspirin 81 MG chewable tablet Chew 1 tablet (81 mg total) by mouth daily. 08/23/20   Merry Lofty, NP  atorvastatin (LIPITOR) 80 MG tablet Take 1 tablet (80 mg total) by mouth daily. 08/29/20   Angiulli, Mcarthur Rossetti, PA-C  levothyroxine (SYNTHROID) 50 MCG tablet Take 1 tablet (50 mcg total) by mouth every morning. 08/29/20   Angiulli, Mcarthur Rossetti, PA-C  lisinopril (ZESTRIL) 5 MG tablet Take 1 tablet (5 mg total) by mouth daily. 08/29/20   Angiulli, Mcarthur Rossetti, PA-C  pantoprazole (PROTONIX) 40 MG tablet Take 1 tablet (40 mg total) by mouth daily. 08/29/20   Angiulli, Mcarthur Rossetti, PA-C   PARoxetine (PAXIL) 10 MG tablet Take 1 tablet (10 mg total) by mouth daily. 08/29/20   Angiulli, Mcarthur Rossetti, PA-C  senna-docusate (SENOKOT-S) 8.6-50 MG tablet Take 1 tablet by mouth 2 (two) times daily. 08/29/20   Angiulli, Mcarthur Rossetti, PA-C  ticagrelor (BRILINTA) 90 MG TABS tablet Take 1 tablet (90 mg total) by mouth 2 (two) times daily. 08/29/20   Angiulli, Mcarthur Rossetti, PA-C  traMADol (ULTRAM) 50 MG tablet Take 1 tablet (50 mg total) by mouth every 6 (six) hours as needed for moderate pain or severe pain. 08/29/20   Angiulli, Mcarthur Rossetti, PA-C    Allergies    Keflex [cephalexin]  Review of Systems   Review of Systems  Constitutional:  Negative for chills and fever.  HENT:  Negative for sore throat.   Eyes:  Negative for redness and visual disturbance.  Respiratory:  Negative for shortness of breath.   Cardiovascular:  Negative for chest pain and leg swelling.  Gastrointestinal:  Positive for nausea and vomiting. Negative for abdominal pain.  Genitourinary:  Negative for dysuria and flank pain.  Musculoskeletal:  Negative for neck pain and neck stiffness.  Skin:  Negative for rash.  Neurological:  Positive for dizziness. Negative for speech difficulty, weakness and numbness.  Hematological:  Does not bruise/bleed easily.  Psychiatric/Behavioral:  Negative for confusion.    Physical Exam Updated Vital Signs BP (!) 155/105 (BP Location: Left Arm)   Pulse 63   Temp 98.6 F (37 C) (Oral)   Resp 13   Ht 1.676 m (5\' 6" )   Wt 78 kg   LMP 04/07/2019 (Approximate)   SpO2 98%   BMI 27.76 kg/m   Physical Exam Vitals and nursing note reviewed.  Constitutional:      Appearance: Normal appearance. She is well-developed.  HENT:     Head: Atraumatic.     Right Ear: Tympanic membrane normal.     Left Ear: Tympanic membrane normal.     Nose: Nose normal.     Mouth/Throat:     Mouth: Mucous membranes are moist.  Eyes:     General: No scleral icterus.    Extraocular Movements: Extraocular  movements intact.     Conjunctiva/sclera: Conjunctivae normal.     Pupils: Pupils are equal, round, and reactive to light.  Neck:     Vascular: No carotid bruit.     Trachea: No tracheal deviation.  Cardiovascular:     Rate and Rhythm: Normal rate and regular rhythm.     Pulses: Normal pulses.     Heart sounds: Normal heart sounds. No murmur heard.   No friction rub. No gallop.  Pulmonary:     Effort: Pulmonary effort is normal. No respiratory distress.     Breath sounds: Normal breath sounds.  Abdominal:  General: Bowel sounds are normal. There is no distension.     Palpations: Abdomen is soft.     Tenderness: There is no abdominal tenderness.  Genitourinary:    Comments: No cva tenderness.  Musculoskeletal:        General: No swelling.     Cervical back: Normal range of motion and neck supple. No rigidity. No muscular tenderness.  Skin:    General: Skin is warm and dry.     Findings: No rash.  Neurological:     Mental Status: She is alert.     Comments: Alert, speech normal. No aphasia or dysarthria. Motor fxn grossly intact, stre 5/5. No pronator drift. Sens grossly intact.   Psychiatric:        Mood and Affect: Mood normal.    ED Results / Procedures / Treatments   Labs (all labs ordered are listed, but only abnormal results are displayed) Results for orders placed or performed during the hospital encounter of 08/22/20  CBC  Result Value Ref Range   WBC 12.6 (H) 4.0 - 10.5 K/uL   RBC 3.85 (L) 3.87 - 5.11 MIL/uL   Hemoglobin 10.8 (L) 12.0 - 15.0 g/dL   HCT 16.1 (L) 09.6 - 04.5 %   MCV 86.2 80.0 - 100.0 fL   MCH 28.1 26.0 - 34.0 pg   MCHC 32.5 30.0 - 36.0 g/dL   RDW 40.9 81.1 - 91.4 %   Platelets 440 (H) 150 - 400 K/uL   nRBC 0.0 0.0 - 0.2 %  Creatinine, serum  Result Value Ref Range   Creatinine, Ser 0.74 0.44 - 1.00 mg/dL   GFR, Estimated >78 >29 mL/min  Comprehensive metabolic panel  Result Value Ref Range   Sodium 139 135 - 145 mmol/L   Potassium 3.7  3.5 - 5.1 mmol/L   Chloride 105 98 - 111 mmol/L   CO2 25 22 - 32 mmol/L   Glucose, Bld 115 (H) 70 - 99 mg/dL   BUN 11 6 - 20 mg/dL   Creatinine, Ser 5.62 0.44 - 1.00 mg/dL   Calcium 9.0 8.9 - 13.0 mg/dL   Total Protein 6.3 (L) 6.5 - 8.1 g/dL   Albumin 3.1 (L) 3.5 - 5.0 g/dL   AST 16 15 - 41 U/L   ALT 22 0 - 44 U/L   Alkaline Phosphatase 62 38 - 126 U/L   Total Bilirubin 1.6 (H) 0.3 - 1.2 mg/dL   GFR, Estimated >86 >57 mL/min   Anion gap 9 5 - 15  CBC WITH DIFFERENTIAL  Result Value Ref Range   WBC 12.3 (H) 4.0 - 10.5 K/uL   RBC 3.80 (L) 3.87 - 5.11 MIL/uL   Hemoglobin 10.9 (L) 12.0 - 15.0 g/dL   HCT 84.6 (L) 96.2 - 95.2 %   MCV 84.7 80.0 - 100.0 fL   MCH 28.7 26.0 - 34.0 pg   MCHC 33.9 30.0 - 36.0 g/dL   RDW 84.1 32.4 - 40.1 %   Platelets 438 (H) 150 - 400 K/uL   nRBC 0.0 0.0 - 0.2 %   Neutrophils Relative % 72 %   Neutro Abs 8.9 (H) 1.7 - 7.7 K/uL   Lymphocytes Relative 15 %   Lymphs Abs 1.8 0.7 - 4.0 K/uL   Monocytes Relative 8 %   Monocytes Absolute 1.0 0.1 - 1.0 K/uL   Eosinophils Relative 3 %   Eosinophils Absolute 0.4 0.0 - 0.5 K/uL   Basophils Relative 1 %   Basophils Absolute 0.1 0.0 -  0.1 K/uL   Immature Granulocytes 1 %   Abs Immature Granulocytes 0.06 0.00 - 0.07 K/uL  Creatinine, serum  Result Value Ref Range   Creatinine, Ser 0.64 0.44 - 1.00 mg/dL   GFR, Estimated >16 >10 mL/min   MR BRAIN WO CONTRAST  Result Date: 09/14/2020 CLINICAL DATA:  Dizziness, pressure in head EXAM: MRI HEAD WITHOUT CONTRAST TECHNIQUE: Multiplanar, multiecho pulse sequences of the brain and surrounding structures were obtained without intravenous contrast. COMPARISON:  Brain MRI 08/13/2020 FINDINGS: Brain: There is patchy diffusion restriction and evolving encephalomalacia in the right MCA distribution consistent with evolving late subacute infarcts, seen on the prior MRI from 08/13/2020. There are small foci of petechial hemorrhage in the right parietal lobe, also present on the  prior study. There is no evidence of new acute infarct. There is no new acute intracranial hemorrhage. Additional foci of FLAIR signal abnormality in the bilateral cerebral hemisphere white matter are nonspecific but likely reflects sequela of chronic white matter microangiopathy. The ventricles are not enlarged. There is no midline shift. There is no mass lesion. Vascular: Normal flow voids. Skull and upper cervical spine: Normal marrow signal. Sinuses/Orbits: The paranasal sinuses are clear. The globes and orbits are unremarkable. Other: None. IMPRESSION: 1. Expected evolution of now late subacute infarcts in the right MCA distribution with increasing encephalomalacia and stable small foci of petechial hemorrhage. 2. No new infarct or intracranial hemorrhage. Electronically Signed   By: Lesia Hausen M.D.   On: 09/14/2020 12:54   VAS [place] TRANSCRANIAL DOPPLER W BUBBLES  Result Date: 08/17/2020  Transcranial Doppler with Bubble Patient Name:  LATERRA LUBINSKI  Date of Exam:   08/17/2020 Medical Rec #: [id number]                Accession #:    [id number] Date of Birth: [birth year]                Patient Gender: F Patient Age:   68 years Exam Location:  [facility] Procedure:      VAS [place] TRANSCRANIAL DOPPLER W BUBBLES Referring Phys: Scheryl Marten XU --------------------------------------------------------------------------------  Indications: Stroke with PFO. Performing Technologist: [place] Ponder RVS  Examination Guidelines: A complete evaluation includes B-mode imaging, spectral Doppler, color Doppler, and power Doppler as needed of all accessible portions of each vessel. Bilateral testing is considered an integral part of a complete examination. Limited examinations for reoccurring indications may be performed as noted.  Summary:  A vascular evaluation was performed. The right middle cerebral artery was studied. An IV was inserted into the patient's right forearm . Verbal informed consent was obtained.   HITS: [name] grade 1 (< 10) - at rest [name] grade 2 (10-30) - with valsalva Above result indicating a small right to left intracardiac communication. *See table(s) above for TCD measurements and observations.  Diagnosing physician: Marvel Plan MD Electronically signed by Marvel Plan MD on 08/17/2020 at 4:27:12 PM.    Final    ECHO TEE  Result Date: 08/17/2020    TRANSESOPHOGEAL ECHO REPORT   Patient Name:   JASPER RUMINSKI ALSTYNE Date of Exam: 08/17/2020 Medical Rec #:  [id number]               Height:       66.0 in Accession #:    [id number]              Weight:       186.3 lb Date of Birth:  Mar 01, 1965  BSA:          1.940 m Patient Age:    54 years                BP:           120/58 mmHg Patient Gender: F                       HR:           65 bpm. Exam Location:  Inpatient Procedure: Transesophageal Echo, Cardiac Doppler, Color Doppler and 3D Echo Indications:     Stroke  History:         Patient has prior history of Echocardiogram examinations, most                  recent 08/13/2020. Risk Factors:Hypertension.  Sonographer:     Ross Ludwig RDCS (AE) Referring Phys:  [id number] Cyndi Bender Diagnosing Phys: Jodelle Red MD PROCEDURE: After discussion of the risks and benefits of a TEE, an informed consent was obtained from the patient. The transesophogeal probe was passed without difficulty through the esophogus of the patient. Local oropharyngeal anesthetic was provided with Cetacaine. Sedation performed by different physician. The patient was monitored while under deep sedation. Anesthestetic sedation was provided intravenously by Anesthesiology: 190.13mg  of Propofol. Image quality was good. The patient's vital signs; including heart rate, blood pressure, and oxygen saturation; remained stable throughout the procedure. The patient developed no complications during the procedure. IMPRESSIONS  1. Left ventricular ejection fraction, by estimation, is 60 to 65%. The left ventricle has normal  function.  2. Right ventricular systolic function is normal. The right ventricular size is normal.  3. No left atrial/left atrial appendage thrombus was detected.  4. The mitral valve is normal in structure. Trivial mitral valve regurgitation. No evidence of mitral stenosis.  5. Tricuspid valve regurgitation is mild to moderate.  6. The aortic valve is tricuspid. Aortic valve regurgitation is not visualized. Mild aortic valve sclerosis is present, with no evidence of aortic valve stenosis.  7. There is mild (Grade II) plaque involving the descending aorta.  8. Evidence of atrial level shunting detected by color flow Doppler. Agitated saline contrast bubble study was negative, with no evidence of any interatrial shunt. There is a small patent foramen ovale with bidirectional shunting across atrial septum. Conclusion(s)/Recommendation(s): PFO present, with both right to left and left to right shunting. Communicated with Dr. Roda Shutters, neurology. FINDINGS  Left Ventricle: Left ventricular ejection fraction, by estimation, is 60 to 65%. The left ventricle has normal function. The left ventricular internal cavity size was normal in size. Right Ventricle: The right ventricular size is normal. No increase in right ventricular wall thickness. Right ventricular systolic function is normal. Left Atrium: Left atrial size was normal in size. No left atrial/left atrial appendage thrombus was detected. Right Atrium: Right atrial size was normal in size. Pericardium: There is no evidence of pericardial effusion. Mitral Valve: The mitral valve is normal in structure. Trivial mitral valve regurgitation. No evidence of mitral valve stenosis. Tricuspid Valve: The tricuspid valve is normal in structure. Tricuspid valve regurgitation is mild to moderate. No evidence of tricuspid stenosis. Aortic Valve: The aortic valve is tricuspid. Aortic valve regurgitation is not visualized. Mild aortic valve sclerosis is present, with no evidence of aortic  valve stenosis. Pulmonic Valve: The pulmonic valve was grossly normal. Pulmonic valve regurgitation is not visualized. No evidence of pulmonic stenosis. Aorta: The aortic root is normal in  size and structure. There is mild (Grade II) plaque involving the descending aorta. IAS/Shunts: Evidence of atrial level shunting detected by color flow Doppler. Agitated saline contrast was given intravenously to evaluate for intracardiac shunting. Agitated saline contrast bubble study was negative, with no evidence of any interatrial shunt. A small patent foramen ovale is detected with bidirectional shunting across atrial septum.  TRICUSPID VALVE TR Peak grad:   34.6 mmHg TR Vmax:        294.00 cm/s Jodelle RedBridgette Christopher MD Electronically signed by Jodelle RedBridgette Christopher MD Signature Date/Time: 08/17/2020/2:25:52 PM    Final    VAS US LOWER EXTREMITY VENOUS (DVT)  Result Date: 08/18/2020  Lower Venous DVT Study Patient Name:  Lyla GlassingNNABELLE F VAN ALSTYNE  Date of Exam:   08/17/2020 Medical Rec #: [id number]                Accession #:    [id number] Date of Birth: 04/23/1965                Patient Gender: F Patient Age:   [age] years [facility] Procedure:      VAS US LOWER EXTREMITY VENOUS (DVT) Referring Phys: Scheryl MartenJINDONG XU --------------------------------------------------------------------------------  Indications: Stroke with pfo.  Comparison Study: no prior Performing Technologist: [place] PonderMegan Stricklin RVS  Examination Guidelines: A complete evaluation includes B-mode imaging, spectral Doppler, color Doppler, and power Doppler as needed of all accessible portions of each vessel. Bilateral testing is considered an integral part of a complete examination. Limited examinations for reoccurring indications may be performed as noted. The reflux portion of the exam is performed with the patient in reverse Trendelenburg.  +---------+---------------+---------+-----------+----------+-------------------+ RIGHT     CompressibilityPhasicitySpontaneityPropertiesThrombus Aging      +---------+---------------+---------+-----------+----------+-------------------+ CFV                     Yes      Yes                  pt unable to                                                              tolerate                                                                  compression         +---------+---------------+---------+-----------+----------+-------------------+ SFJ                                                   Not well visualized +---------+---------------+---------+-----------+----------+-------------------+ FV Prox  Full                                                             +---------+---------------+---------+-----------+----------+-------------------+  FV Mid   Full                                                             +---------+---------------+---------+-----------+----------+-------------------+ FV DistalFull                                                             +---------+---------------+---------+-----------+----------+-------------------+ PFV      Full                                                             +---------+---------------+---------+-----------+----------+-------------------+ POP      Full           Yes      Yes                                      +---------+---------------+---------+-----------+----------+-------------------+ PTV      Full                                                             +---------+---------------+---------+-----------+----------+-------------------+ PERO     Full                                                             +---------+---------------+---------+-----------+----------+-------------------+   +---------+---------------+---------+-----------+----------+--------------+ LEFT     CompressibilityPhasicitySpontaneityPropertiesThrombus Aging  +---------+---------------+---------+-----------+----------+--------------+ CFV      Full           Yes      Yes                                 +---------+---------------+---------+-----------+----------+--------------+ SFJ      Full                                                        +---------+---------------+---------+-----------+----------+--------------+ FV Prox  Full                                                        +---------+---------------+---------+-----------+----------+--------------+ FV Mid   Full                                                        +---------+---------------+---------+-----------+----------+--------------+  FV DistalFull                                                        +---------+---------------+---------+-----------+----------+--------------+ PFV      Full                                                        +---------+---------------+---------+-----------+----------+--------------+ POP      Full           Yes      Yes                                 +---------+---------------+---------+-----------+----------+--------------+ PTV      Full                                                        +---------+---------------+---------+-----------+----------+--------------+ PERO     Full                                                        +---------+---------------+---------+-----------+----------+--------------+     Summary: BILATERAL: - No evidence of deep vein thrombosis seen in the lower extremities, bilaterally. -No evidence of popliteal cyst, bilaterally.   *See table(s) above for measurements and observations. Electronically signed by Coral Else MD on 08/18/2020 at 1:26:34 PM.    Final     EKG None  Radiology MR BRAIN WO CONTRAST  Result Date: 09/14/2020 CLINICAL DATA:  Dizziness, pressure in head EXAM: MRI HEAD WITHOUT CONTRAST TECHNIQUE: Multiplanar, multiecho pulse sequences of the brain  and surrounding structures were obtained without intravenous contrast. COMPARISON:  Brain MRI 08/13/2020 FINDINGS: Brain: There is patchy diffusion restriction and evolving encephalomalacia in the right MCA distribution consistent with evolving late subacute infarcts, seen on the prior MRI from 08/13/2020. There are small foci of petechial hemorrhage in the right parietal lobe, also present on the prior study. There is no evidence of new acute infarct. There is no new acute intracranial hemorrhage. Additional foci of FLAIR signal abnormality in the bilateral cerebral hemisphere white matter are nonspecific but likely reflects sequela of chronic white matter microangiopathy. The ventricles are not enlarged. There is no midline shift. There is no mass lesion. Vascular: Normal flow voids. Skull and upper cervical spine: Normal marrow signal. Sinuses/Orbits: The paranasal sinuses are clear. The globes and orbits are unremarkable. Other: None. IMPRESSION: 1. Expected evolution of now late subacute infarcts in the right MCA distribution with increasing encephalomalacia and stable small foci of petechial hemorrhage. 2. No new infarct or intracranial hemorrhage. Electronically Signed   By: Lesia Hausen M.D.   On: 09/14/2020 12:54    Procedures Procedures   Medications Ordered in ED Medications  sodium chloride 0.9 % bolus 500 mL (has no administration in time range)  ondansetron (ZOFRAN) injection 4 mg (has no administration in time range)    ED Course  I have reviewed the triage vital signs and the nursing notes.  Pertinent labs & imaging results that were available during my care of the patient were reviewed by me and considered in my medical decision making (see chart for details).    MDM Rules/Calculators/A&P                          Iv ns bolus. Zofran iv. Labs and imaging ordered.   Reviewed nursing notes and prior charts for additional history.   Labs reviewed/interpreted by me - no   MRI  reviewed/interpreted by me - no new cva.   Po fluids. Food.   Acetaminophen po.   Pt tolerates po.   Ambulate in hall - no ataxia.   Pt currently appears stable for d/c.   Return precautions provided.      Final Clinical Impression(s) / ED Diagnoses Final diagnoses:  None    Rx / DC Orders ED Discharge Orders     None        Cathren Laine, MD 09/14/20 1325

## 2020-09-14 NOTE — ED Notes (Signed)
Pt transported to MRI 

## 2020-09-14 NOTE — Discharge Instructions (Addendum)
It was our pleasure to provide your ER care today - we hope that you feel better.  Drink plenty of fluids/make sure to stay well hydrated. Take zofran as need for nausea. Take acetaminophen as need. You may try antivert/meclizine as need for symptom relief if dizzy - available over the counter.   Follow up with primary care doctor in the next 1-2 days for recheck if symptoms fail to improve/resolve. Also, your blood pressure is mildly high today  - follow up with your doctor in the coming week.  Return to ER if worse, new symptoms, fevers, new, worsening or severe pain, change in speech or vision, numbness/weakness, chest pain, trouble breathing, or other concern.   You were given pain meds in the ER - no driving for the next 6 hours.

## 2020-09-14 NOTE — ED Triage Notes (Signed)
Pt BIB [facility] from home c/o dizziness and pressure in her head. Pt is also unable to lay flat or she starts vomiting. Pt has a hx of a stroke/bleed about 3 weeks ago, only deficit she had was some finger tingling. Pt states she is on Brillenta. Pt is concerned about another stroke. Per EMS no orthostatic BP changes.

## 2020-09-14 NOTE — ED Notes (Signed)
Pt still in MRI. Will update vitals when pt returns. 

## 2020-09-17 ENCOUNTER — Other Ambulatory Visit: Payer: Self-pay

## 2020-09-17 ENCOUNTER — Ambulatory Visit (INDEPENDENT_AMBULATORY_CARE_PROVIDER_SITE_OTHER): Payer: BC Managed Care – PPO | Admitting: Physician Assistant

## 2020-09-17 VITALS — BP 120/80 | HR 86 | Temp 98.0°F | Resp 14 | Ht 66.0 in | Wt 167.0 lb

## 2020-09-17 DIAGNOSIS — I729 Aneurysm of unspecified site: Secondary | ICD-10-CM

## 2020-09-17 DIAGNOSIS — T81718A Complication of other artery following a procedure, not elsewhere classified, initial encounter: Secondary | ICD-10-CM

## 2020-09-17 NOTE — Progress Notes (Signed)
  POST OPERATIVE OFFICE NOTE    CC:  F/u for surgery  HPI:  This is a 55 y.o. female who is s/p  55 year old female who underwent neuro intervention  08/14/20 through a right femoral approach.  She began having right groin pain and was found to have a pseudoaneurysm that was not amenable to percutaneous treatment.  She is s/p Repair of right femoral pseudoaneurysm.    Pt returns today for follow up.  Pt states she has no pain at the incision.  She denise rest pain, non healing wounds and no symptoms of claudication.    Allergies  Allergen Reactions   Keflex [Cephalexin]     Current Outpatient Medications  Medication Sig Dispense Refill   acetaminophen (TYLENOL) 500 MG tablet Take 500 mg by mouth every 6 (six) hours as needed for mild pain, fever or headache.     aspirin 81 MG chewable tablet Chew 1 tablet (81 mg total) by mouth daily.     atorvastatin (LIPITOR) 80 MG tablet Take 1 tablet (80 mg total) by mouth daily. 30 tablet 0   levothyroxine (SYNTHROID) 50 MCG tablet Take 1 tablet (50 mcg total) by mouth every morning. 30 tablet 0   lisinopril (ZESTRIL) 5 MG tablet Take 1 tablet (5 mg total) by mouth daily. 30 tablet 0   pantoprazole (PROTONIX) 40 MG tablet Take 1 tablet (40 mg total) by mouth daily. (Patient taking differently: Take 40 mg by mouth daily as needed (acid/indigestion).) 30 tablet 0   PARoxetine (PAXIL) 10 MG tablet Take 1 tablet (10 mg total) by mouth daily. (Patient taking differently: Take 10 mg by mouth at bedtime.) 30 tablet 0   senna-docusate (SENOKOT-S) 8.6-50 MG tablet Take 1 tablet by mouth 2 (two) times daily. (Patient taking differently: Take 1 tablet by mouth daily as needed for mild constipation.)     ticagrelor (BRILINTA) 90 MG TABS tablet Take 1 tablet (90 mg total) by mouth 2 (two) times daily. 60 tablet 0   traMADol (ULTRAM) 50 MG tablet Take 1 tablet (50 mg total) by mouth every 6 (six) hours as needed for moderate pain or severe pain. 30 tablet 0    lisinopril-hydrochlorothiazide (ZESTORETIC) 20-12.5 MG tablet Take 1 tablet by mouth daily. (Patient not taking: No sig reported)     No current facility-administered medications for this visit.     ROS:  See HPI  Physical Exam:    Incision:  well healed with out erythema or edema  Extremities:  Palpable pedal pulses Neuro: sensation intact and equal B LE Heart RRR   Assessment/Plan:  This is a 55 y.o. female who is s/p:s/p Repair of right femoral pseudoaneurysm.    She is doing better over all.  She has increased her activity around her home and working towards starting a walking program.  Prior to this intervention she and her husband were walking 5 miles at a time.  She has new vertigo that she is being worked up for with neurology.    F/U PRN   Mosetta Pigeon PA-C [facility] [phone]   Clinic MD:  Myra Gianotti

## 2020-09-19 ENCOUNTER — Ambulatory Visit (HOSPITAL_COMMUNITY)
Admission: RE | Admit: 2020-09-19 | Discharge: 2020-09-19 | Disposition: A | Discharge status: Home / Self Care | Payer: BC Managed Care – PPO | Source: Ambulatory Visit | Attending: Student | Admitting: Student

## 2020-09-19 ENCOUNTER — Other Ambulatory Visit: Payer: Self-pay

## 2020-09-19 DIAGNOSIS — I6521 Occlusion and stenosis of right carotid artery: Secondary | ICD-10-CM

## 2020-09-24 HISTORY — PX: IR RADIOLOGIST EVAL & MGMT: IMG5224

## 2020-10-01 ENCOUNTER — Encounter: Payer: Self-pay | Admitting: Cardiovascular Disease

## 2020-10-01 ENCOUNTER — Ambulatory Visit: Payer: BC Managed Care – PPO | Admitting: Cardiovascular Disease

## 2020-10-01 ENCOUNTER — Other Ambulatory Visit: Payer: Self-pay

## 2020-10-01 VITALS — BP 140/100 | HR 75 | Ht 66.0 in | Wt 170.8 lb

## 2020-10-01 DIAGNOSIS — Q211 Atrial septal defect: Secondary | ICD-10-CM

## 2020-10-01 DIAGNOSIS — Q2112 Patent foramen ovale: Secondary | ICD-10-CM

## 2020-10-01 NOTE — Patient Instructions (Signed)
Medication Instructions:  No changes *If you need a refill on your cardiac medications before your next appointment, please call your pharmacy*   Lab Work: None today If you have labs (blood work) drawn today and your tests are completely normal, you will receive your results only by: MyChart Message (if you have MyChart) OR A paper copy in the mail If you have any lab test that is abnormal or we need to change your treatment, we will call you to review the results.   Testing/Procedures: CLOSURE INSTRUCTIONS: You will be scheduled for a PFO/ASD CLOSURE with Dr. Tonny Bollman.  1. Please arrive at the [facility] (Main Entrance A) at [facility]: [address] [place], [place] [address] at __________ (This time is two hours before your procedure to ensure your preparation). Free valet parking service is available. You are allowed ONE visitor in the waiting room during your procedure. Both you and your guest must wear masks. Special note: Every effort is made to have your procedure done on time. Please understand that emergencies sometimes delay scheduled procedures.  2. Diet: Make sure to stay well hydrated the day before your procedure! Do not eat solid foods after midnight.  You may have clear liquids until 5am upon the day of the procedure.  3. Labs: You will need to have blood drawn (CBC, BMET within 30 days of procedure) You do not need to be fasting.  4. Medication instructions in preparation for your procedure:  1)  MAKE SURE TO TAKE YOUR ASPIRIN AND BRILINTA the morning of your closure  2) Other meds may be taken as directed with a sip of water.  5. Plan for one night stay--bring personal belongings (this is a disclaimer, not an intention. We want you to go home!). 6. Bring a current list of your medications and current insurance cards. 7. You MUST have a responsible person to drive you home. 8. Someone MUST be with you the first 24 hours after you arrive home or your  discharge will be delayed. 9. Please wear clothes that are easy to get on and off and wear slip-on shoes.   FOLLOW-UP APPOINTMENT: You will be scheduled for your 1 month follow-up with Carlean Jews, PA. [address]

## 2020-10-01 NOTE — H&P (View-Only) (Signed)
[facility]   STRUCTURAL HEART TEAM  Date:  10/04/2020   ID:  Vicki Gonzales, DOB [birth year], MRN [id number]  PCP:  Darrow Bussing, MD   Chief Complaint  Patient presents with   PFO      HISTORY OF PRESENT ILLNESS: Vicki Gonzales is a 55 y.o. female who presents for evaluation of PFO, referred by Dr Roda Shutters.   The patient has a past medical history of hypertension, migraine headaches, hypothyroidism, and depression.  She was in her normal state of health until August of this year when she developed sudden onset of left facial droop and slurred speech.  A code stroke was called and emergent CTA showed proximal occlusion of the right internal carotid artery with intracranial reconstitution.  There is also suspicion of subocclusive embolus in the right MCA bifurcation and occlusion of the left vertebral artery at its origin with distal reconstitution.  She was treated with tPA and she underwent emergent revascularization by interventional neuroradiology.  She has subsequently been treated with aspirin and ticagrelor.  She demonstrated a right groin hematoma with pseudoaneurysm and required surgical repair.  TEE was ultimately completed and demonstrated a PFO.  The patient presents for consultation for transcatheter PFO closure.  The patient is here alone today.  She is currently doing well. Today, she denies symptoms of palpitations, chest pain, shortness of breath, orthopnea, PND, lower extremity edema, dizziness, or syncope. The patient has had a localized skin reaction to some cheap jewelry but denies any clear clinical reaction.  Past Medical History:  Diagnosis Date   ADHD    Anxiety    Carpal tunnel syndrome on right    Depression    HTN (hypertension)    Hypothyroidism    Migraines    Spondylosis    Stroke [facility])     Current Outpatient Medications  Medication Sig Dispense Refill   acetaminophen (TYLENOL) 500 MG tablet Take 500 mg by mouth every 6  (six) hours as needed for mild pain, fever or headache.     aspirin 81 MG chewable tablet Chew 1 tablet (81 mg total) by mouth daily.     atorvastatin (LIPITOR) 80 MG tablet Take 1 tablet (80 mg total) by mouth daily. 30 tablet 0   levothyroxine (SYNTHROID) 50 MCG tablet Take 1 tablet (50 mcg total) by mouth every morning. 30 tablet 0   lisinopril (ZESTRIL) 5 MG tablet Take 1 tablet (5 mg total) by mouth daily. 30 tablet 0   pantoprazole (PROTONIX) 40 MG tablet Take 40 mg by mouth daily.     PARoxetine (PAXIL) 10 MG tablet Take 10 mg by mouth daily.     senna-docusate (SENOKOT-S) 8.6-50 MG tablet Take 1 tablet by mouth 2 (two) times daily. (Patient taking differently: Take 1 tablet by mouth daily as needed for mild constipation.)     ticagrelor (BRILINTA) 90 MG TABS tablet Take 1 tablet (90 mg total) by mouth 2 (two) times daily. 60 tablet 0   amLODipine (NORVASC) 5 MG tablet Take 1 tablet (5 mg total) by mouth daily. 90 tablet 3   No current facility-administered medications for this visit.    ALLERGIES:   Keflex [cephalexin]   SOCIAL HISTORY:  The patient  reports that she has quit smoking. She has never used smokeless tobacco.   FAMILY HISTORY:  The patient's family history is not on file.   REVIEW OF SYSTEMS:  Positive for headache.   All other systems are reviewed and negative.  PHYSICAL EXAM: VS:  BP (!) 140/100   Pulse 75   Ht 5\' 6"  (1.676 m)   Wt 170 lb 12.8 oz (77.5 kg)   LMP 04/07/2019 (Approximate)   SpO2 98%   BMI 27.57 kg/m  , BMI Body mass index is 27.57 kg/m. GEN: Well nourished, well developed, in no acute distress HEENT: normal Neck: No JVD. carotids 2+ without bruits or masses Cardiac: The heart is RRR without murmurs, rubs, or gallops. No edema. Pedal pulses 2+ = bilaterally  Respiratory:  clear to auscultation bilaterally GI: soft, nontender, nondistended, + BS MS: no deformity or atrophy Skin: warm and dry, no rash Neuro:  Strength and sensation are  intact Psych: euthymic mood, full affect  RECENT LABS: 08/16/2020: TSH 1.578 09/14/2020: ALT 17; BUN 6; Creatinine, Ser 0.63; Hemoglobin 11.8; Platelets 278; Potassium 3.5; Sodium 141  08/12/2020: Cholesterol 253; HDL 38; LDL Cholesterol 172; Total CHOL/HDL Ratio 6.7; Triglycerides 217; VLDL 43   Estimated Creatinine Clearance: 82.6 mL/min (by C-G formula based on SCr of 0.63 mg/dL).   Wt Readings from Last 3 Encounters:  10/01/20 170 lb 12.8 oz (77.5 kg)  09/17/20 167 lb (75.8 kg)  09/14/20 172 lb (78 kg)     PERTINENT STUDIES: Transcranial Doppler:  HITS:  [name] grade 1 (< 10) - at rest  [name] grade 2 (10-30) - with valsalva  Above result indicating a small right to left intracardiac communication.   MRI Brain:  IMPRESSION: 1. Multifocal areas of right MCA territory infarct including a new area in the right perirolandic region when compared to prior CT. 2. Few small foci of susceptibility artifact within the areas of restricted diffusion corresponding to petechial hemorrhage. 3. Occlusion of the intracranial right ICA with reconstitution at the ophthalmic segment. 4. Occlusion of a right M3/MCA anterior division branch. 5. Stable trace right subarachnoid hemorrhage. 6. Moderate chronic white matter disease.  TEE:   1. Left ventricular ejection fraction, by estimation, is 60 to 65%. The  left ventricle has normal function.   2. Right ventricular systolic function is normal. The right ventricular  size is normal.   3. No left atrial/left atrial appendage thrombus was detected.   4. The mitral valve is normal in structure. Trivial mitral valve  regurgitation. No evidence of mitral stenosis.   5. Tricuspid valve regurgitation is mild to moderate.   6. The aortic valve is tricuspid. Aortic valve regurgitation is not  visualized. Mild aortic valve sclerosis is present, with no evidence of  aortic valve stenosis.   7. There is mild (Grade II) plaque involving the descending  aorta.   8. Evidence of atrial level shunting detected by color flow Doppler.  Agitated saline contrast bubble study was negative, with no evidence of  any interatrial shunt. There is a small patent foramen ovale with  bidirectional shunting across atrial septum.   Conclusion(s)/Recommendation(s): PFO present, with both right to left and  left to right shunting. Communicated with Dr. 11/14/20, neurology.   ASSESSMENT AND PLAN: 1.  PFO: Radiographic results, hospital notes, lab results, and cardiac imaging data are reviewed. TEE images are reviewed and demonstrate normal LV/RV function, mild-moderate TR, no other significant valvular disease, and a moderate to large PFO based on the presence of color flow through the defect indicated bidirectional shunt and a strongly positive bubble study indicating large PFO.  Other hospital records, radiographic studies, and lab studies were reviewed.  Her hypercoagulable panel is normal with no evidence of anticardiolipin antibody, negative lupus anticoagulant,  negative beta-2 glycoprotein.  The patient's Rope Score is 6, indicating a moderate probability that the patient's stroke is 'PFO-related.'  The patient is counseled about the association of PFO and cryptogenic stroke. Available clinical trial data is reviewed, specifically those trials comparing transcatheter PFO closure and medical therapy with antiplatelet drugs. The patient understands the potential benefit of PFO closure with respect to secondary stroke reduction compared with medical therapy alone. Specific risks of transcatheter PFO closure are reviewed with the patient. These risks include bleeding, infection, device embolization, stroke, cardiac perforation, tamponade, arrhythmia, MI, and late device erosion. She understands these serious risks occur at low incidence of < 1%.  As part of her evaluation today I demonstrated the procedural steps as well as the Amplatzer PFO occluder device.  We reviewed each  step of the procedure as well as potential complications.  We discussed expected recovery.  The patient understands the need to take dual antiplatelet therapy for a period of 3 to 6 months following transcatheter PFO closure.  We will probably just continue her on aspirin and ticagrelor since this is been recommended after her stroke intervention.  They understand the need to follow SBE prophylaxis per guidelines for a period of 6 months following PFO closure.  The patient provides full informed consent for the procedure.   Tonny Bollman 10/04/2020 4:44 PM

## 2020-10-01 NOTE — Progress Notes (Signed)
[facility]   STRUCTURAL HEART TEAM  Date:  10/04/2020   ID:  Vicki Gonzales, DOB 02/09/1965, MRN [id number]  PCP:  Koirala, Dibas, MD   Chief Complaint  Patient presents with   PFO      HISTORY OF PRESENT ILLNESS: Vicki Gonzales is a 54 y.o. female who presents for evaluation of PFO, referred by Dr Xu.   The patient has a past medical history of hypertension, migraine headaches, hypothyroidism, and depression.  She was in her normal state of health until August of this year when she developed sudden onset of left facial droop and slurred speech.  A code stroke was called and emergent CTA showed proximal occlusion of the right internal carotid artery with intracranial reconstitution.  There is also suspicion of subocclusive embolus in the right MCA bifurcation and occlusion of the left vertebral artery at its origin with distal reconstitution.  She was treated with tPA and she underwent emergent revascularization by interventional neuroradiology.  She has subsequently been treated with aspirin and ticagrelor.  She demonstrated a right groin hematoma with pseudoaneurysm and required surgical repair.  TEE was ultimately completed and demonstrated a PFO.  The patient presents for consultation for transcatheter PFO closure.  The patient is here alone today.  She is currently doing well. Today, she denies symptoms of palpitations, chest pain, shortness of breath, orthopnea, PND, lower extremity edema, dizziness, or syncope. The patient has had a localized skin reaction to some cheap jewelry but denies any clear clinical reaction.  Past Medical History:  Diagnosis Date   ADHD    Anxiety    Carpal tunnel syndrome on right    Depression    HTN (hypertension)    Hypothyroidism    Migraines    Spondylosis    Stroke (HCC)     Current Outpatient Medications  Medication Sig Dispense Refill   acetaminophen (TYLENOL) 500 MG tablet Take 500 mg by mouth every 6  (six) hours as needed for mild pain, fever or headache.     aspirin 81 MG chewable tablet Chew 1 tablet (81 mg total) by mouth daily.     atorvastatin (LIPITOR) 80 MG tablet Take 1 tablet (80 mg total) by mouth daily. 30 tablet 0   levothyroxine (SYNTHROID) 50 MCG tablet Take 1 tablet (50 mcg total) by mouth every morning. 30 tablet 0   lisinopril (ZESTRIL) 5 MG tablet Take 1 tablet (5 mg total) by mouth daily. 30 tablet 0   pantoprazole (PROTONIX) 40 MG tablet Take 40 mg by mouth daily.     PARoxetine (PAXIL) 10 MG tablet Take 10 mg by mouth daily.     senna-docusate (SENOKOT-S) 8.6-50 MG tablet Take 1 tablet by mouth 2 (two) times daily. (Patient taking differently: Take 1 tablet by mouth daily as needed for mild constipation.)     ticagrelor (BRILINTA) 90 MG TABS tablet Take 1 tablet (90 mg total) by mouth 2 (two) times daily. 60 tablet 0   amLODipine (NORVASC) 5 MG tablet Take 1 tablet (5 mg total) by mouth daily. 90 tablet 3   No current facility-administered medications for this visit.    ALLERGIES:   Keflex [cephalexin]   SOCIAL HISTORY:  The patient  reports that she has quit smoking. She has never used smokeless tobacco.   FAMILY HISTORY:  The patient's family history is not on file.   REVIEW OF SYSTEMS:  Positive for headache.   All other systems are reviewed and negative.     PHYSICAL EXAM: VS:  BP (!) 140/100   Pulse 75   Ht 5\' 6"  (1.676 m)   Wt 170 lb 12.8 oz (77.5 kg)   LMP 04/07/2019 (Approximate)   SpO2 98%   BMI 27.57 kg/m  , BMI Body mass index is 27.57 kg/m. GEN: Well nourished, well developed, in no acute distress HEENT: normal Neck: No JVD. carotids 2+ without bruits or masses Cardiac: The heart is RRR without murmurs, rubs, or gallops. No edema. Pedal pulses 2+ = bilaterally  Respiratory:  clear to auscultation bilaterally GI: soft, nontender, nondistended, + BS MS: no deformity or atrophy Skin: warm and dry, no rash Neuro:  Strength and sensation are  intact Psych: euthymic mood, full affect  RECENT LABS: 08/16/2020: TSH 1.578 09/14/2020: ALT 17; BUN 6; Creatinine, Ser 0.63; Hemoglobin 11.8; Platelets 278; Potassium 3.5; Sodium 141  08/12/2020: Cholesterol 253; HDL 38; LDL Cholesterol 172; Total CHOL/HDL Ratio 6.7; Triglycerides 217; VLDL 43   Estimated Creatinine Clearance: 82.6 mL/min (by C-G formula based on SCr of 0.63 mg/dL).   Wt Readings from Last 3 Encounters:  10/01/20 170 lb 12.8 oz (77.5 kg)  09/17/20 167 lb (75.8 kg)  09/14/20 172 lb (78 kg)     PERTINENT STUDIES: Transcranial Doppler:  HITS:  [name] grade 1 (< 10) - at rest  [name] grade 2 (10-30) - with valsalva  Above result indicating a small right to left intracardiac communication.   MRI Brain:  IMPRESSION: 1. Multifocal areas of right MCA territory infarct including a new area in the right perirolandic region when compared to prior CT. 2. Few small foci of susceptibility artifact within the areas of restricted diffusion corresponding to petechial hemorrhage. 3. Occlusion of the intracranial right ICA with reconstitution at the ophthalmic segment. 4. Occlusion of a right M3/MCA anterior division branch. 5. Stable trace right subarachnoid hemorrhage. 6. Moderate chronic white matter disease.  TEE:   1. Left ventricular ejection fraction, by estimation, is 60 to 65%. The  left ventricle has normal function.   2. Right ventricular systolic function is normal. The right ventricular  size is normal.   3. No left atrial/left atrial appendage thrombus was detected.   4. The mitral valve is normal in structure. Trivial mitral valve  regurgitation. No evidence of mitral stenosis.   5. Tricuspid valve regurgitation is mild to moderate.   6. The aortic valve is tricuspid. Aortic valve regurgitation is not  visualized. Mild aortic valve sclerosis is present, with no evidence of  aortic valve stenosis.   7. There is mild (Grade II) plaque involving the descending  aorta.   8. Evidence of atrial level shunting detected by color flow Doppler.  Agitated saline contrast bubble study was negative, with no evidence of  any interatrial shunt. There is a small patent foramen ovale with  bidirectional shunting across atrial septum.   Conclusion(s)/Recommendation(s): PFO present, with both right to left and  left to right shunting. Communicated with Dr. 11/14/20, neurology.   ASSESSMENT AND PLAN: 1.  PFO: Radiographic results, hospital notes, lab results, and cardiac imaging data are reviewed. TEE images are reviewed and demonstrate normal LV/RV function, mild-moderate TR, no other significant valvular disease, and a moderate to large PFO based on the presence of color flow through the defect indicated bidirectional shunt and a strongly positive bubble study indicating large PFO.  Other hospital records, radiographic studies, and lab studies were reviewed.  Her hypercoagulable panel is normal with no evidence of anticardiolipin antibody, negative lupus anticoagulant,  negative beta-2 glycoprotein.  The patient's Rope Score is 6, indicating a moderate probability that the patient's stroke is 'PFO-related.'  The patient is counseled about the association of PFO and cryptogenic stroke. Available clinical trial data is reviewed, specifically those trials comparing transcatheter PFO closure and medical therapy with antiplatelet drugs. The patient understands the potential benefit of PFO closure with respect to secondary stroke reduction compared with medical therapy alone. Specific risks of transcatheter PFO closure are reviewed with the patient. These risks include bleeding, infection, device embolization, stroke, cardiac perforation, tamponade, arrhythmia, MI, and late device erosion. She understands these serious risks occur at low incidence of < 1%.  As part of her evaluation today I demonstrated the procedural steps as well as the Amplatzer PFO occluder device.  We reviewed each  step of the procedure as well as potential complications.  We discussed expected recovery.  The patient understands the need to take dual antiplatelet therapy for a period of 3 to 6 months following transcatheter PFO closure.  We will probably just continue her on aspirin and ticagrelor since this is been recommended after her stroke intervention.  They understand the need to follow SBE prophylaxis per guidelines for a period of 6 months following PFO closure.  The patient provides full informed consent for the procedure.   Axl Rodino 10/04/2020 4:44 PM  

## 2020-10-02 ENCOUNTER — Telehealth: Payer: Self-pay

## 2020-10-02 DIAGNOSIS — Q2112 Patent foramen ovale: Secondary | ICD-10-CM

## 2020-10-02 DIAGNOSIS — Q211 Atrial septal defect: Secondary | ICD-10-CM

## 2020-10-02 MED ORDER — AMLODIPINE BESYLATE 5 MG PO TABS: 5.0000 mg | ORAL_TABLET | Freq: Every day | ORAL | 3 refills | Status: DC

## 2020-10-02 NOTE — Telephone Encounter (Signed)
Called to arrange PFO closure. Scheduled the patient for PFO closure 10/24/2020. She will come 10/15/2020 to update blood work.  Will send message with instructions after confirming with Dr. Excell Seltzer.  Message sent to precert.

## 2020-10-02 NOTE — Telephone Encounter (Signed)
Discussed with Dr. Excell Seltzer.  Confirmed the patient will stay on Brilinta, uninterrupted, for procedure scheduled 10/19. Will START AMLODIPINE 5 mg daily. Will send MyChart message with instructions per patient request.

## 2020-10-15 ENCOUNTER — Other Ambulatory Visit: Payer: BC Managed Care – PPO | Admitting: *Deleted

## 2020-10-15 ENCOUNTER — Other Ambulatory Visit: Payer: Self-pay

## 2020-10-15 DIAGNOSIS — Q2112 Patent foramen ovale: Secondary | ICD-10-CM

## 2020-10-15 LAB — CBC WITH DIFFERENTIAL/PLATELET
Basophils Absolute: 0.1 10*3/uL (ref 0.0–0.2)
Basos: 1 %
EOS (ABSOLUTE): 0.2 10*3/uL (ref 0.0–0.4)
Eos: 2 %
Hematocrit: 39.9 % (ref 34.0–46.6)
Hemoglobin: 12.9 g/dL (ref 11.1–15.9)
Immature Grans (Abs): 0 10*3/uL (ref 0.0–0.1)
Immature Granulocytes: 0 %
Lymphocytes Absolute: 2 10*3/uL (ref 0.7–3.1)
Lymphs: 20 %
MCH: 27.6 pg (ref 26.6–33.0)
MCHC: 32.3 g/dL (ref 31.5–35.7)
MCV: 85 fL (ref 79–97)
Monocytes Absolute: 0.7 10*3/uL (ref 0.1–0.9)
Monocytes: 7 %
Neutrophils Absolute: 7 10*3/uL (ref 1.4–7.0)
Neutrophils: 70 %
Platelets: 384 10*3/uL (ref 150–450)
RBC: 4.67 x10E6/uL (ref 3.77–5.28)
RDW: 14 % (ref 11.7–15.4)
WBC: 9.9 10*3/uL (ref 3.4–10.8)

## 2020-10-15 LAB — BASIC METABOLIC PANEL
BUN/Creatinine Ratio: 17 (ref 9–23)
BUN: 15 mg/dL (ref 6–24)
CO2: 23 mmol/L (ref 20–29)
Calcium: 9.8 mg/dL (ref 8.7–10.2)
Chloride: 100 mmol/L (ref 96–106)
Creatinine, Ser: 0.87 mg/dL (ref 0.57–1.00)
Glucose: 110 mg/dL — ABNORMAL HIGH (ref 70–99)
Potassium: 4 mmol/L (ref 3.5–5.2)
Sodium: 138 mmol/L (ref 134–144)
eGFR: 78 mL/min/{1.73_m2} (ref >59)

## 2020-10-23 ENCOUNTER — Telehealth: Payer: Self-pay | Admitting: *Deleted

## 2020-10-23 NOTE — Telephone Encounter (Signed)
PFO closure  scheduled at [facility] for: Wednesday October 24, 2020 11:30 AM [name] Main Entr[facility]) at: 9:30 AM   No solid food after midnight prior to cath, clear liquids until 5 AM day of procedure.  Usual morning medications can be taken pre-cath with sips of water including: -aspirin 81 mg -Brilinta 90 mg    Confirmed patient has responsible adult to drive home post procedure and be with patient first 24 hours after arriving home.  [facility] does allow one visitor to accompany you and wait in the hospital waiting room while you are there for your procedure. You and your visitor will be asked to wear a mask once you enter the hospital.   Patient reports does not currently have any symptoms concerning for COVID-19 and no household members with COVID-19 like illness.      Reviewed procedure/mask/visitor instructions with patient.

## 2020-10-24 ENCOUNTER — Ambulatory Visit (HOSPITAL_BASED_OUTPATIENT_CLINIC_OR_DEPARTMENT_OTHER): Payer: BC Managed Care – PPO

## 2020-10-24 ENCOUNTER — Encounter (HOSPITAL_COMMUNITY)
Admission: RE | Disposition: A | Discharge status: Home / Self Care | Payer: Self-pay | Source: Home / Self Care | Attending: Cardiovascular Disease

## 2020-10-24 ENCOUNTER — Ambulatory Visit (HOSPITAL_COMMUNITY)
Admission: RE | Admit: 2020-10-24 | Discharge: 2020-10-24 | Disposition: A | Discharge status: Home / Self Care | Payer: BC Managed Care – PPO | Attending: Cardiovascular Disease | Admitting: Cardiovascular Disease

## 2020-10-24 ENCOUNTER — Other Ambulatory Visit: Payer: Self-pay

## 2020-10-24 DIAGNOSIS — Q2112 Patent foramen ovale: Secondary | ICD-10-CM

## 2020-10-24 DIAGNOSIS — I639 Cerebral infarction, unspecified: Secondary | ICD-10-CM | POA: Diagnosis not present

## 2020-10-24 DIAGNOSIS — Z87891 Personal history of nicotine dependence: Secondary | ICD-10-CM | POA: Insufficient documentation

## 2020-10-24 DIAGNOSIS — Z79899 Other long term (current) drug therapy: Secondary | ICD-10-CM | POA: Diagnosis not present

## 2020-10-24 DIAGNOSIS — Q211 Atrial septal defect, unspecified: Secondary | ICD-10-CM | POA: Diagnosis not present

## 2020-10-24 DIAGNOSIS — Z881 Allergy status to other antibiotic agents status: Secondary | ICD-10-CM | POA: Insufficient documentation

## 2020-10-24 DIAGNOSIS — I10 Essential (primary) hypertension: Secondary | ICD-10-CM | POA: Diagnosis not present

## 2020-10-24 DIAGNOSIS — E039 Hypothyroidism, unspecified: Secondary | ICD-10-CM | POA: Insufficient documentation

## 2020-10-24 DIAGNOSIS — Z7982 Long term (current) use of aspirin: Secondary | ICD-10-CM | POA: Diagnosis not present

## 2020-10-24 DIAGNOSIS — Z7902 Long term (current) use of antithrombotics/antiplatelets: Secondary | ICD-10-CM | POA: Insufficient documentation

## 2020-10-24 DIAGNOSIS — F32A Depression, unspecified: Secondary | ICD-10-CM | POA: Insufficient documentation

## 2020-10-24 HISTORY — PX: PATENT FORAMEN OVALE(PFO) CLOSURE: CATH118300

## 2020-10-24 LAB — ECHOCARDIOGRAM LIMITED
Height: 66 in
Weight: 2640 oz

## 2020-10-24 LAB — POCT ACTIVATED CLOTTING TIME: Activated Clotting Time: 225 seconds

## 2020-10-24 SURGERY — PATENT FORAMEN OVALE (PFO) CLOSURE
Anesthesia: LOCAL

## 2020-10-24 MED ORDER — SODIUM CHLORIDE 0.9% FLUSH: 3.0000 mL | Freq: Two times a day (BID) | INTRAVENOUS | Status: DC

## 2020-10-24 MED ORDER — HEPARIN SODIUM (PORCINE) 1000 UNIT/ML IJ SOLN
INTRAMUSCULAR | Status: DC | PRN
  Administered 2020-10-24: 5000 [IU] via INTRAVENOUS

## 2020-10-24 MED ORDER — ONDANSETRON HCL 4 MG/2ML IJ SOLN: 4.0000 mg | Freq: Four times a day (QID) | INTRAMUSCULAR | Status: DC | PRN

## 2020-10-24 MED ORDER — LIDOCAINE HCL (PF) 1 % IJ SOLN
INTRAMUSCULAR | Status: DC | PRN
  Administered 2020-10-24: 20 mL

## 2020-10-24 MED ORDER — VANCOMYCIN HCL IN DEXTROSE 1-5 GM/200ML-% IV SOLN
1000.0000 mg | INTRAVENOUS | Status: AC
  Administered 2020-10-24: 1000 mg via INTRAVENOUS

## 2020-10-24 MED ORDER — MIDAZOLAM HCL 2 MG/2ML IJ SOLN
INTRAMUSCULAR | Status: AC
  Filled 2020-10-24: qty 2

## 2020-10-24 MED ORDER — SODIUM CHLORIDE 0.9 % IV SOLN: 250.0000 mL | INTRAVENOUS | Status: DC | PRN

## 2020-10-24 MED ORDER — SODIUM CHLORIDE 0.9% FLUSH: 3.0000 mL | INTRAVENOUS | Status: DC | PRN

## 2020-10-24 MED ORDER — SODIUM CHLORIDE 0.9 % WEIGHT BASED INFUSION: 1.0000 mL/kg/h | INTRAVENOUS | Status: DC

## 2020-10-24 MED ORDER — HEPARIN (PORCINE) IN NACL 1000-0.9 UT/500ML-% IV SOLN
INTRAVENOUS | Status: AC
  Filled 2020-10-24: qty 1000

## 2020-10-24 MED ORDER — LABETALOL HCL 5 MG/ML IV SOLN: 10.0000 mg | INTRAVENOUS | Status: DC | PRN

## 2020-10-24 MED ORDER — SODIUM CHLORIDE 0.9 % IV SOLN: INTRAVENOUS | Status: AC

## 2020-10-24 MED ORDER — HYDRALAZINE HCL 20 MG/ML IJ SOLN: 10.0000 mg | INTRAMUSCULAR | Status: DC | PRN

## 2020-10-24 MED ORDER — SODIUM CHLORIDE 0.9 % WEIGHT BASED INFUSION
3.0000 mL/kg/h | INTRAVENOUS | Status: AC
  Administered 2020-10-24: 3 mL/kg/h via INTRAVENOUS

## 2020-10-24 MED ORDER — HEPARIN (PORCINE) IN NACL 1000-0.9 UT/500ML-% IV SOLN
INTRAVENOUS | Status: DC | PRN
  Administered 2020-10-24: 500 mL

## 2020-10-24 MED ORDER — MIDAZOLAM HCL 2 MG/2ML IJ SOLN
INTRAMUSCULAR | Status: DC | PRN
  Administered 2020-10-24: 2 mg via INTRAVENOUS

## 2020-10-24 MED ORDER — FENTANYL CITRATE (PF) 100 MCG/2ML IJ SOLN
INTRAMUSCULAR | Status: AC
  Filled 2020-10-24: qty 2

## 2020-10-24 MED ORDER — ASPIRIN 81 MG PO CHEW: 81.0000 mg | CHEWABLE_TABLET | ORAL | Status: DC

## 2020-10-24 MED ORDER — HEPARIN SODIUM (PORCINE) 1000 UNIT/ML IJ SOLN
INTRAMUSCULAR | Status: AC
  Filled 2020-10-24: qty 1

## 2020-10-24 MED ORDER — TICAGRELOR 90 MG PO TABS: 90.0000 mg | ORAL_TABLET | ORAL | Status: DC

## 2020-10-24 MED ORDER — ACETAMINOPHEN 325 MG PO TABS: 650.0000 mg | ORAL_TABLET | ORAL | Status: DC | PRN

## 2020-10-24 MED ORDER — FENTANYL CITRATE (PF) 100 MCG/2ML IJ SOLN
INTRAMUSCULAR | Status: DC | PRN
  Administered 2020-10-24: 25 ug via INTRAVENOUS

## 2020-10-24 SURGICAL SUPPLY — 14 items
CATH ACUNAV 8FR 90CM (CATHETERS) ×1 IMPLANT
CATH INFINITI 6F MPA2 100CM (CATHETERS) ×1 IMPLANT
CLOSURE PERCLOSE PROSTYLE (VASCULAR PRODUCTS) ×2 IMPLANT
COVER SWIFTLINK CONNECTOR (BAG) ×1 IMPLANT
GUIDEWIRE AMPLATZER 1.5JX260 (WIRE) ×1 IMPLANT
OCCLUDER PFO TALISMAN 18-18 (Prosthesis & Implant Heart) IMPLANT
PACK CARDIAC CATHETERIZATION (CUSTOM PROCEDURE TRAY) ×2 IMPLANT
SHEATH DELIVERY TALISMAN 8F 80 (SHEATH) IMPLANT
SHEATH PINNACLE 8F 10CM (SHEATH) ×1 IMPLANT
SHEATH PINNACLE 9F 10CM (SHEATH) ×1 IMPLANT
TALISMAN DELIVERY SHEATH 8F 80 (SHEATH) ×2
TALISMAN PFO OCCLUDER 18-18 (Prosthesis & Implant Heart) ×2 IMPLANT
WIRE EMERALD 3MM-J .025X260CM (WIRE) ×1 IMPLANT
WIRE EMERALD 3MM-J .035X260CM (WIRE) ×1 IMPLANT

## 2020-10-24 NOTE — Interval H&P Note (Signed)
History and Physical Interval Note:  10/24/2020 12:19 PM  Vicki Gonzales  has presented today for surgery, with the diagnosis of PFO.  The various methods of treatment have been discussed with the patient and family. After consideration of risks, benefits and other options for treatment, the patient has consented to  Procedure(s): PATENT FORAMEN OVALE (PFO) CLOSURE (N/A) as a surgical intervention.  The patient's history has been reviewed, patient examined, no change in status, stable for surgery.  I have reviewed the patient's chart and labs.  Questions were answered to the patient's satisfaction.     Tonny Bollman

## 2020-10-24 NOTE — Progress Notes (Signed)
  Echocardiogram 2D Echocardiogram has been performed.  Augustine Radar 10/24/2020, 2:37 PM

## 2020-10-24 NOTE — Discharge Instructions (Signed)

## 2020-10-25 ENCOUNTER — Encounter (HOSPITAL_COMMUNITY): Payer: Self-pay | Admitting: Cardiovascular Disease

## 2020-11-05 ENCOUNTER — Institutional Professional Consult (permissible substitution): Payer: BC Managed Care – PPO | Admitting: Cardiovascular Disease

## 2020-11-20 ENCOUNTER — Encounter: Payer: Self-pay | Admitting: Neurology

## 2020-11-20 ENCOUNTER — Ambulatory Visit: Payer: BC Managed Care – PPO | Admitting: Neurology

## 2020-11-20 VITALS — BP 118/68 | HR 67 | Ht 66.0 in | Wt 171.0 lb

## 2020-11-20 DIAGNOSIS — I6521 Occlusion and stenosis of right carotid artery: Secondary | ICD-10-CM | POA: Diagnosis not present

## 2020-11-20 DIAGNOSIS — I63411 Cerebral infarction due to embolism of right middle cerebral artery: Secondary | ICD-10-CM | POA: Diagnosis not present

## 2020-11-20 DIAGNOSIS — Q2112 Patent foramen ovale: Secondary | ICD-10-CM | POA: Diagnosis not present

## 2020-11-20 DIAGNOSIS — E782 Mixed hyperlipidemia: Secondary | ICD-10-CM | POA: Diagnosis not present

## 2020-11-20 MED ORDER — COENZYME Q10 30 MG PO CAPS: 200.0000 mg | ORAL_CAPSULE | Freq: Three times a day (TID) | ORAL | 1 refills | Status: DC

## 2020-11-20 NOTE — Progress Notes (Signed)
[name] [address] [place]. [place] [address] [phone]       OFFICE FOLLOW-UP NOTE  Ms. Ardelle Anton Date of Birth:  November 24, 1965 Medical Record Number:  [id number]   HPI: Ms. [name] is a pleasant 55 year old Caucasian lady seen today for initial office follow-up visit following hospital admission for stroke in August 2022.  History is obtained from the patient and review of electronic medical records and I have personally reviewed pertinent available imaging films in PACS. 55 yo woman with pmhx HTN, depression, migraines hypothyroidism BIB EMS on 08/12/2020 to [facility] after developing acute osnet of L facial droop and slurred speech at 0900  AM. On initial neurological examination slurred speech had resolved and she had only L NLF flattening for NIHSS = 0. tPA was initially not administered due to low NIHSS. CT head wo contrast showed hypodensity R insula and frontal operculum, ASPECTS 8, with equivocal hyperdensity of the R M1. CTA was performed which showed prox R ICA occlusion with intracranial reconstitution, finding suspicious for subocclusive embolus vs severe stenosis at the R MCA bifurcation, occlusion of L vert at origin with distal reconstitution, and CTP showing 25cc penumbra R MCA territory. Based on CTA/CTP findings Case was discussed with interventional neuroradiologist on-call who recommended administration of tPA and activation of Code IR.  After detailed discussion about the risks/benefits of both tPA and thrombectomy with the patient, including 10% risk of hemorrhage with the latter,  she gave informed consent. She had no exclusion criteria for tPA. She did have a slip out of bed this morning and bumped her head on the dresser lightly but there was no LOC or other sx and no blood on CT head. Patient was not on anticoagulation.  Patient underwent successful rescue right proximal carotid artery angioplasty and stenting followed by  mechanical thrombectomy of the occluded right M3 branch.  2D echo showed ejection fraction 6065%.  TEE showed no clot but possible PFO.  TCD bubble study confirmed [name] grade 2 right to left shunt with Valsalva.  Rope score was 6.  Lower extremity venous Dopplers were negative for DVT.  LDL cholesterol was elevated at 172 mg percent hemoglobin A1c was 6.2.  Hospital course was complicated by slight small right groin hematoma and Doppler showed 2.3 x 2 cm pseudoaneurysm vascular surgery was consulted and it was repaired patient was started on aspirin and Brilinta and did well and was discharged.  She subsequently had outpatient elective endovascular PFO closure on 10/24/2020 by Dr. Burt Knack which went well.  Patient was also suspected to have obstructive sleep apnea and after discussion agreed to participate in the sleep smart study and tested positive on the overnight NOx 3 monitor.  She was randomized to the CPAP treatment arm and states she is doing well and has been quite compliant with using it and feels better.  She is tolerating aspirin and Brilinta well without any bleeding but only minor bruising.  She is complaining of joint aches and pains on Lipitor.  She still has some diminished fine motor skills on the left and feels that her left leg is dragging at times.  She is not able to exercise to her full capacity and gets tired easily.  At times she feels she may be off balance though she has had no falls or injuries.  She is returned back to work full-time.  He has no new complaints today. ROS:   14 system review of systems is positive for  leg weakness, dragging of the leg, difficulty walking diminished fine motor skills, decreased stamina, sleep apnea, tiredness all other systems negative  PMH:  Past Medical History:  Diagnosis Date   ADHD    Anxiety    Carpal tunnel syndrome on right    Depression    HTN (hypertension)    Hypothyroidism    Migraines    Spondylosis    Stroke [facility])      Social History:  Social History   Socioeconomic History   Marital status: Married    Spouse name: Not on file   Number of children: 5   Years of education: 12   Highest education level: High school graduate  Occupational History   Occupation: Retired  Tobacco Use   Smoking status: Former   Smokeless tobacco: Never   Tobacco comments:    Quit smoking in her 20's.  Substance and Sexual Activity   Alcohol use: Not Currently   Drug use: Never   Sexual activity: Not on file  Other Topics Concern   Not on file  Social History Narrative   Lives with family.   Right-handed.   Caffeine use: one cup coffee each morning.   Social Determinants of Health   Financial Resource Strain: Not on file  Food Insecurity: Not on file  Transportation Needs: Not on file  Physical Activity: Not on file  Stress: Not on file  Social Connections: Not on file  Intimate Partner Violence: Not on file    Medications:   Current Outpatient Medications on File Prior to Visit  Medication Sig Dispense Refill   acetaminophen (TYLENOL) 325 MG tablet Take 325 mg by mouth every 6 (six) hours as needed for moderate pain.     amLODipine (NORVASC) 5 MG tablet Take 1 tablet (5 mg total) by mouth daily. 90 tablet 3   aspirin 81 MG chewable tablet Chew 1 tablet (81 mg total) by mouth daily.     atorvastatin (LIPITOR) 80 MG tablet Take 1 tablet (80 mg total) by mouth daily. 30 tablet 0   calcium carbonate (TUMS - DOSED IN MG ELEMENTAL CALCIUM) 500 MG chewable tablet Chew 0.5 tablets by mouth daily as needed for indigestion or heartburn.     levothyroxine (SYNTHROID) 50 MCG tablet Take 1 tablet (50 mcg total) by mouth every morning. 30 tablet 0   lisinopril (ZESTRIL) 5 MG tablet Take 1 tablet (5 mg total) by mouth daily. 30 tablet 0   pantoprazole (PROTONIX) 40 MG tablet Take 40 mg by mouth daily.     PARoxetine (PAXIL) 10 MG tablet Take 10 mg by mouth daily.     ticagrelor (BRILINTA) 90 MG TABS tablet Take 1  tablet (90 mg total) by mouth 2 (two) times daily. 60 tablet 0   No current facility-administered medications on file prior to visit.    Allergies:   Allergies  Allergen Reactions   Keflex [Cephalexin] Itching    Physical Exam General: Mildly obese middle-age Caucasian lady, seated, in no evident distress Head: head normocephalic and atraumatic.  Neck: supple with no carotid or supraclavicular bruits Cardiovascular: regular rate and rhythm, no murmurs Musculoskeletal: no deformity Skin:  no rash/petichiae Vascular:  Normal pulses all extremities Vitals:   11/20/20 0955  BP: 118/68  Pulse: 67   Neurologic Exam Mental Status: Awake and fully alert. Oriented to place and time. Recent and remote memory intact. Attention span, concentration and fund of knowledge appropriate. Mood and affect appropriate.  Cranial Nerves: Fundoscopic exam reveals sharp disc  margins. Pupils equal, briskly reactive to light. Extraocular movements full without nystagmus. Visual fields full to confrontation. Hearing intact. Facial sensation intact. Face, tongue, palate moves normally and symmetrically.  Motor: Normal bulk and tone. Normal strength in all tested extremity muscles.  Diminished fine finger movements on the left.  Mild left grip weakness.  Orbits right over left upper extremity. Sensory.: intact to touch ,pinprick .position and vibratory sensation.  Coordination: Rapid alternating movements normal in all extremities. Finger-to-nose and heel-to-shin performed accurately bilaterally. Gait and Station: Arises from chair without difficulty. Stance is normal. Gait demonstrates normal stride length and balance but mild dragging of the left leg.  Wearing a left knee brace for support.. Able to heel, toe and tandem walk with mild difficulty.  Reflexes: 1+ and symmetric. Toes downgoing.   NIHSS  0 Modified Rankin  2   ASSESSMENT: 55 year old Caucasian lady with right middle cerebral artery branch  infarct and August 2022 secondary to proximal right carotid occlusion with distal embolization treated with IV tPA and emergent rescue proximal right internal carotid artery angioplasty and stenting with excellent clinical outcome.  She also has patent foramen ovale for which she underwent elective endovascular PFO closure on 10/24/2020 which has gone well.  She is also participating in the sleep smart study for sleep apnea and stroke prevention and has been randomized to this CPAP treatment.  Vascular risk factors of hypertension, hyperlipidemia, sleep apnea, carotid stenosis and mild obesity     PLAN: I had a long d/w patient about her recent stroke, carotid stent, PFO closure,risk for recurrent stroke/TIAs, personally independently reviewed imaging studies and stroke evaluation results and answered questions.Continue aspirin 81 mg daily and Brilinta (ticagrelor) 90 mg bid  for secondary stroke prevention and maintain strict control of hypertension with blood pressure goal below 130/90, diabetes with hemoglobin A1c goal below 6.5% and lipids with LDL cholesterol goal below 70 mg/dL. I also advised the patient to eat a healthy diet with plenty of whole grains, cereals, fruits and vegetables, exercise regularly and maintain ideal body weight .check follow-up lipid profile, carotid ultrasound and TCD bubble study for adequacy of PFO closure.  Add co-Q10 200 mg daily for her poor statin myalgias and if she does not improve may consider switching her to the new 6 monthly injectable medicine  Lequio in the future.  Continue CPAP treatment for her participation in the sleep apnea stroke prevention study and I counseled her to be compliant with using it.  Followup in the future with my nurse practitioner in 3 months or call earlier if necessary. Greater than 50% of time during this prolonged 40 minute visit was spent on counseling,explanation of diagnosis, planning of further management, discussion with patient and  family and coordination of care Delia Heady, MD  Note: This document was prepared with digital dictation and possible smart phrase technology. Any transcriptional errors that result from this process are unintentional

## 2020-11-20 NOTE — Progress Notes (Signed)
[facility]   MULTIDISCIPLINARY HEART VALVE CLINIC                                     Cardiology Office Note:    Date:  11/26/2020   ID:  Vicki Gonzales, DOB 02-12-1965, MRN [id number]  PCP:  Darrow Bussing, MD  [facility] Cardiologist:  None  [facility] Electrophysiologist:  Lanier Prude, MD   Referring MD: Darrow Bussing, MD   Chief Complaint  Patient presents with   Follow-up    S/p PFO    History of Present Illness:    Vicki Gonzales is a 55 y.o. female with a hx of hypertension, migraine headaches, hypothyroidism, depression, recent R ICA occlusive CVA, OSA on CPAP, and PFO s/p PFO closure per Dr. Excell Seltzer 10/24/20.   In 08/2020 the patient developed sudden onset of left facial droop and slurred speech. She presented to the ED at which time a code stroke was called. Emergent CTA showed proximal occlusion of the right internal carotid artery with intracranial reconstitution. There was also suspicion of subocclusive embolus in the right MCA bifurcation and occlusion of the left vertebral artery at its origin with distal reconstitution. She was treated with tPA and she underwent emergent revascularization by interventional neuroradiology. She subsequently was treated with ASA and Plavix. TEE was ultimately completed and demonstrated a PFO. She was then referred to Dr. Excell Seltzer for PFO closure.   TEE imaging showed normal LV/RV function, mild-moderate TR, no other significant valvular disease, and a moderate to large PFO based on the presence of color flow through the defect indicated bidirectional shunt and a strongly positive bubble study indicating large PFO.  She ultimately underwent PFO closure on 10/24/20 with an 18 mm Amplatzer PFO occluder deployed under intracardiac echo and fluoroscopic guidance. Recommendations were to  continue ASA x1 month and Brilinta x6 months. After 6 months of Brilinta, patient should resume ASA 81 mg daily  indefinitely.  She will also need SBE prophylaxis x6 months.Post procedure echocardiogram showed a well-seated Amplatzer septal occluder with no visible shunt by color doppler.  She presents today for follow up. She reports that she has been doing very well since her procedure. She was seen by Dr. Pearlean Brownie 11/20/20 and was continued on ASA and Brilinta. She was scheduled for follow up carotid dopplers and bubble study. She is also in the process of completing a sleep study for suspected OSA. She reports she has been having myalgias with her statin and Co-Q 10 was added to her regimen. If she continues to have issues, plan for possible referral to lipid clinic. We discussed this. She states her BP has improved and she has had no further issues with dizziness. She denies chest pain, LE edema, bleeding in stool or urine. No orthopnea, SOB, or syncope.   Past Medical History:  Diagnosis Date   ADHD    Anxiety    Carpal tunnel syndrome on right    Depression    HTN (hypertension)    Hypothyroidism    Migraines    Spondylosis    Stroke [facility])     Past Surgical History:  Procedure Laterality Date   ANTERIOR CRUCIATE LIGAMENT REPAIR Left 2021   APPENDECTOMY     BUBBLE STUDY  08/17/2020   Procedure: BUBBLE STUDY;  Surgeon: Jodelle Red, MD;  Location: [facility] ENDOSCOPY;  Service: Cardiovascular;;   FALSE  ANEURYSM REPAIR Right 08/15/2020   Procedure: REPAIR RIGHT FEMORAL PSEUDOANEURYSM;  Surgeon: Nada Libman, MD;  Location: [facility] OR;  Service: Vascular;  Laterality: Right;   IR ANGIO INTRA EXTRACRAN SEL COM CAROTID INNOMINATE UNI L MOD SED  08/14/2020   IR ANGIO INTRA EXTRACRAN SEL INTERNAL CAROTID UNI R MOD SED  08/14/2020   IR ANGIO VERTEBRAL SEL SUBCLAVIAN INNOMINATE BILAT MOD SED  08/14/2020   IR CT HEAD LTD  08/14/2020   IR CT HEAD LTD  08/14/2020   IR INTRA CRAN STENT  08/14/2020   IR INTRAVSC STENT CERV CAROTID W/O EMB-PROT MOD SED INC ANGIO  08/14/2020   IR RADIOLOGIST EVAL & MGMT  09/24/2020    PATENT FORAMEN OVALE(PFO) CLOSURE N/A 10/24/2020   Procedure: PATENT FORAMEN OVALE (PFO) CLOSURE;  Surgeon: Tonny Bollman, MD;  Location: [facility] INVASIVE CV LAB;  Service: Cardiovascular;  Laterality: N/A;   RADIOLOGY WITH ANESTHESIA N/A 08/14/2020   Procedure: IR WITH ANESTHESIA;  Surgeon: Julieanne Cotton, MD;  Location: [facility] OR;  Service: Radiology;  Laterality: N/A;   TEE WITHOUT CARDIOVERSION N/A 08/17/2020   Procedure: TRANSESOPHAGEAL ECHOCARDIOGRAM (TEE);  Surgeon: Jodelle Red, MD;  Location: [facility] ENDOSCOPY;  Service: Cardiovascular;  Laterality: N/A;   TUBAL LIGATION      Current Medications: Current Meds  Medication Sig   acetaminophen (TYLENOL) 325 MG tablet Take 325 mg by mouth every 6 (six) hours as needed for moderate pain.   amLODipine (NORVASC) 5 MG tablet Take 1 tablet (5 mg total) by mouth daily.   amoxicillin (AMOXIL) 500 MG tablet Take 4 tablets (2,000 mg total) by mouth as needed (DENTAL PROCEDURES).   aspirin 81 MG chewable tablet Chew 1 tablet (81 mg total) by mouth daily.   atorvastatin (LIPITOR) 80 MG tablet Take 1 tablet (80 mg total) by mouth daily.   calcium carbonate (TUMS - DOSED IN MG ELEMENTAL CALCIUM) 500 MG chewable tablet Chew 0.5 tablets by mouth daily as needed for indigestion or heartburn.   co-enzyme Q-10 30 MG capsule Take 7 capsules (210 mg total) by mouth 3 (three) times daily.   levothyroxine (SYNTHROID) 50 MCG tablet Take 1 tablet (50 mcg total) by mouth every morning.   lisinopril (ZESTRIL) 5 MG tablet Take 1 tablet (5 mg total) by mouth daily.   pantoprazole (PROTONIX) 40 MG tablet Take 40 mg by mouth daily.   PARoxetine (PAXIL) 10 MG tablet Take 10 mg by mouth daily.   ticagrelor (BRILINTA) 90 MG TABS tablet Take 1 tablet (90 mg total) by mouth 2 (two) times daily.     Allergies:   Keflex [cephalexin]   Social History   Socioeconomic History   Marital status: Married    Spouse name: Not on file   Number of children: 5   Years of  education: 12   Highest education level: High school graduate  Occupational History   Occupation: Retired  Tobacco Use   Smoking status: Former   Smokeless tobacco: Never   Tobacco comments:    Quit smoking in her 20's.  Substance and Sexual Activity   Alcohol use: Not Currently   Drug use: Never   Sexual activity: Not on file  Other Topics Concern   Not on file  Social History Narrative   Lives with family.   Right-handed.   Caffeine use: one cup coffee each morning.   Social Determinants of Health   Financial Resource Strain: Not on file  Food Insecurity: Not on file  Transportation Needs: Not on file  Physical Activity: Not on file  Stress: Not on file  Social Connections: Not on file     Family History: The patient's family history includes Congestive Heart Failure in her father; Dementia in her mother.  ROS:   Please see the history of present illness.    All other systems reviewed and are negative.  EKGs/Labs/Other Studies Reviewed:    The following studies were reviewed today:   Echocardiogram 10/24/20:   1. Left ventricular ejection fraction, by estimation, is 60 to 65%. The  left ventricle has normal function. The left ventricle has no regional  wall motion abnormalities. Left ventricular diastolic function could not  be evaluated.   2. Right ventricular systolic function is normal. The right ventricular  size is normal.   3. Amplatzer septal occluder well-seated on the atrial septum without  visible shunt by color Doppler.   4. The mitral valve is normal in structure.   5. The aortic valve is tricuspid. There is mild thickening of the aortic  valve. Mild aortic valve sclerosis is present, with no evidence of aortic  valve stenosis.   6. The inferior vena cava is normal in size with greater than 50%  respiratory variability, suggesting right atrial pressure of 3 mmHg.   TEE:   1. Left ventricular ejection fraction, by estimation, is 60 to 65%. The   left ventricle has normal function.   2. Right ventricular systolic function is normal. The right ventricular  size is normal.   3. No left atrial/left atrial appendage thrombus was detected.   4. The mitral valve is normal in structure. Trivial mitral valve  regurgitation. No evidence of mitral stenosis.   5. Tricuspid valve regurgitation is mild to moderate.   6. The aortic valve is tricuspid. Aortic valve regurgitation is not  visualized. Mild aortic valve sclerosis is present, with no evidence of  aortic valve stenosis.   7. There is mild (Grade II) plaque involving the descending aorta.   8. Evidence of atrial level shunting detected by color flow Doppler.  Agitated saline contrast bubble study was negative, with no evidence of  any interatrial shunt. There is a small patent foramen ovale with  bidirectional shunting across atrial septum.   Conclusion(s)/Recommendation(s): PFO present, with both right to left and  left to right shunting. Communicated with Dr. Roda Shutters, neurology.    EKG:  EKG is not ordered today.    Recent Labs: 08/16/2020: TSH 1.578 09/14/2020: ALT 17 10/15/2020: BUN 15; Creatinine, Ser 0.87; Hemoglobin 12.9; Platelets 384; Potassium 4.0; Sodium 138   Recent Lipid Panel    Component Value Date/Time   CHOL 169 11/20/2020 1042   TRIG 338 (H) 11/20/2020 1042   HDL 35 (L) 11/20/2020 1042   CHOLHDL 4.8 (H) 11/20/2020 1042   CHOLHDL 6.7 08/12/2020 1628   VLDL 43 (H) 08/12/2020 1628   LDLCALC 79 11/20/2020 1042    Physical Exam:    VS:  BP 118/74   Pulse 72   Ht 5\' 6"  (1.676 m)   Wt 171 lb 9.6 oz (77.8 kg)   LMP 04/07/2019 (Approximate)   SpO2 98%   BMI 27.70 kg/m     Wt Readings from Last 3 Encounters:  11/26/20 171 lb 9.6 oz (77.8 kg)  11/20/20 171 lb (77.6 kg)  10/24/20 165 lb (74.8 kg)    General: Well developed, well nourished, NAD Neck: Negative for carotid bruits. No JVD Lungs:Clear to ausculation bilaterally. Breathing is  unlabored. Cardiovascular: RRR with S1 S2. No murmur  Extremities: No edema.  Neuro: Alert and oriented. No focal deficits. No facial asymmetry. MAE spontaneously. Psych: Responds to questions appropriately with normal affect.    ASSESSMENT/PLAN:    1. PFO closure: Underwent successful PFO closure 10/24/20 with an 18 mm Amplatzer PFO occluder deployed under intracardiac echo and fluoroscopic guidance. Recommendations were to continue ASA x1 month and Brilinta x6 months. After 6 months of Brilinta, patient should resume ASA 81 mg daily indefinitely although she was recently seen by Dr. Pearlean Brownie with recommendations to continue ASA and Brilinta. Will message Dr. Pearlean Brownie and confirm if he is ok with her coming off Brilinta at the 6 month mark (04/2021) and continue ASA indefinately. She sees neuro again in 02/2021. Rx sent for Amoxicillin 2g one hour prior to dental procedures or cleanings for 6 months only. Post procedure echocardiogram showed a well-seated Amplatzer septal occluder with no visible shunt by color doppler. Plan for repeat at 1 year post procedure.   2. CVA: Follows closely with Dr. Pearlean Brownie. Plan for carotids and transcranial doppler with bubble this month. Continue with excellent HLD, HTN control. No new neuro changes. Need to confirm medication regimen with neuro.   3. HLD: Last LDL, 79. Continue statin with the addition of Co-Q10 per neuro. If she continues to have issues with myalgias, consider referral to Lipid clinic of possible PCSK-9 inhibitors.   4. HTN: Great control today with the addition of amlodipine. No change.    Medication Adjustments/Labs and Tests Ordered: Current medicines are reviewed at length with the patient today.  Concerns regarding medicines are outlined above.  Orders Placed This Encounter  Procedures   ECHOCARDIOGRAM COMPLETE BUBBLE STUDY    Meds ordered this encounter  Medications   amoxicillin (AMOXIL) 500 MG tablet    Sig: Take 4 tablets (2,000 mg total)  by mouth as needed (DENTAL PROCEDURES).    Dispense:  8 tablet    Refill:  0    Patient Instructions  Medication Instructions:  Your physician has recommended you make the following change in your medication:  START AMOXICILLIN 500 MG TAKE 4 TABLETS (2,000) 1 HOUR PRIOR TO DENTAL APPOINTMENTS   *If you need a refill on your cardiac medications before your next appointment, please call your pharmacy*   Lab Work: NONE If you have labs (blood work) drawn today and your tests are completely normal, you will receive your results only by: MyChart Message (if you have MyChart) OR A paper copy in the mail If you have any lab test that is abnormal or we need to change your treatment, we will call you to review the results.   Testing/Procedures: Your physician has requested that you have an echocardiogram BUBBLE STUDY. Echocardiography is a painless test that uses sound waves to create images of your heart. It provides your doctor with information about the size and shape of your heart and how well your heart's chambers and valves are working. This procedure takes approximately one hour. There are no restrictions for this procedure.    Follow-Up: At [facility], you and your health needs are our priority.  As part of our continuing mission to provide you with exceptional heart care, we have created designated Provider Care Teams.  These Care Teams include your primary Cardiologist (physician) and Advanced Practice Providers (APPs -  Physician Assistants and Nurse Practitioners) who all work together to provide you with the care you need, when you need it.  We recommend signing up for the patient portal called "MyChart".  Sign up information is provided on this After Visit Summary.  MyChart is used to connect with patients for Virtual Visits (Telemedicine).  Patients are able to view lab/test results, encounter notes, upcoming appointments, etc.  Non-urgent messages can be sent to your provider  as well.   To learn more about what you can do with MyChart, go to [email or web address].    Your next appointment:   1 year(s)  The format for your next appointment:   In Person  Provider:   Georgie Chard, [facility]

## 2020-11-20 NOTE — Patient Instructions (Signed)
I had a long d/w patient about her recent stroke, carotid stent, PFO closure,risk for recurrent stroke/TIAs, personally independently reviewed imaging studies and stroke evaluation results and answered questions.Continue aspirin 81 mg daily and Brilinta (ticagrelor) 90 mg bid  for secondary stroke prevention and maintain strict control of hypertension with blood pressure goal below 130/90, diabetes with hemoglobin A1c goal below 6.5% and lipids with LDL cholesterol goal below 70 mg/dL. I also advised the patient to eat a healthy diet with plenty of whole grains, cereals, fruits and vegetables, exercise regularly and maintain ideal body weight .check follow-up lipid profile, carotid ultrasound and TCD bubble study for adequacy of PFO closure.  Add co-Q10 200 mg daily for her poor statin myalgias and if she does not improve may consider switching her to the new 6 monthly injectable medicine  Lequio in the future.  Continue CPAP treatment for her participation in the sleep apnea stroke prevention study and I counseled her to be compliant with using it.  Followup in the future with my nurse practitioner in 3 months or call earlier if necessary. Stroke Prevention Some medical conditions and behaviors can lead to a higher chance of having a stroke. You can help prevent a stroke by eating healthy, exercising, not smoking, and managing any medical conditions you have. Stroke is a leading cause of functional impairment. Primary prevention is particularly important because a majority of strokes are first-time events. Stroke changes the lives of not only those who experience a stroke but also their family and other caregivers. How can this condition affect me? A stroke is a medical emergency and should be treated right away. A stroke can lead to brain damage and can sometimes be life-threatening. If a person gets medical treatment right away, there is a better chance of surviving and recovering from a stroke. What can  increase my risk? The following medical conditions may increase your risk of a stroke: Cardiovascular disease. High blood pressure (hypertension). Diabetes. High cholesterol. Sickle cell disease. Blood clotting disorders (hypercoagulable state). Obesity. Sleep disorders (obstructive sleep apnea). Other risk factors include: Being older than age 43. Having a history of blood clots, stroke, or mini-stroke (transient ischemic attack, TIA). Genetic factors, such as race, ethnicity, or a family history of stroke. Smoking cigarettes or using other tobacco products. Taking birth control pills, especially if you also use tobacco. Heavy use of alcohol or drugs, especially cocaine and methamphetamine. Physical inactivity. What actions can I take to prevent this? Manage your health conditions High cholesterol levels. Eating a healthy diet is important for preventing high cholesterol. If cholesterol cannot be managed through diet alone, you may need to take medicines. Take any prescribed medicines to control your cholesterol as told by your health care provider. Hypertension. To reduce your risk of stroke, try to keep your blood pressure below 130/80. Eating a healthy diet and exercising regularly are important for controlling blood pressure. If these steps are not enough to manage your blood pressure, you may need to take medicines. Take any prescribed medicines to control hypertension as told by your health care provider. Ask your health care provider if you should monitor your blood pressure at home. Have your blood pressure checked every year, even if your blood pressure is normal. Blood pressure increases with age and some medical conditions. Diabetes. Eating a healthy diet and exercising regularly are important parts of managing your blood sugar (glucose). If your blood sugar cannot be managed through diet and exercise, you may need to take medicines.  Take any prescribed medicines to control  your diabetes as told by your health care provider. Get evaluated for obstructive sleep apnea. Talk to your health care provider about getting a sleep evaluation if you snore a lot or have excessive sleepiness. Make sure that any other medical conditions you have, such as atrial fibrillation or atherosclerosis, are managed. Nutrition Follow instructions from your health care provider about what to eat or drink to help manage your health condition. These instructions may include: Reducing your daily calorie intake. Limiting how much salt (sodium) you use to 1,500 milligrams (mg) each day. Using only healthy fats for cooking, such as olive oil, canola oil, or sunflower oil. Eating healthy foods. You can do this by: Choosing foods that are high in fiber, such as whole grains, and fresh fruits and vegetables. Eating at least 5 servings of fruits and vegetables a day. Try to fill one-half of your plate with fruits and vegetables at each meal. Choosing lean protein foods, such as lean cuts of meat, poultry without skin, fish, tofu, beans, and nuts. Eating low-fat dairy products. Avoiding foods that are high in sodium. This can help lower blood pressure. Avoiding foods that have saturated fat, trans fat, and cholesterol. This can help prevent high cholesterol. Avoiding processed and prepared foods. Counting your daily carbohydrate intake.  Lifestyle If you drink alcohol: Limit how much you have to: 0-1 drink a day for women who are not pregnant. 0-2 drinks a day for men. Know how much alcohol is in your drink. In the [place] one drink equals one 12 oz bottle of beer ( ), one 5 oz glass of wine ( ), or one 1 oz glass of hard liquor (50mL). Do not use any products that contain nicotine or tobacco. These products include cigarettes, chewing tobacco, and vaping devices, such as e-cigarettes. If you need help quitting, ask your health care provider. Avoid secondhand smoke. Do not use  drugs. Activity  Try to stay at a healthy weight. Get at least 30 minutes of exercise on most days, such as: Fast walking. Biking. Swimming. Medicines Take over-the-counter and prescription medicines only as told by your health care provider. Aspirin or blood thinners (antiplatelets or anticoagulants) may be recommended to reduce your risk of forming blood clots that can lead to stroke. Avoid taking birth control pills. Talk to your health care provider about the risks of taking birth control pills if: You are over 13 years old. You smoke. You get very bad headaches. You have had a blood clot. Where to find more information American Stroke Association: www.strokeassociation.org Get help right away if: You or a loved one has any symptoms of a stroke. "BE FAST" is an easy way to remember the main warning signs of a stroke: B - Balance. Signs are dizziness, sudden trouble walking, or loss of balance. E - Eyes. Signs are trouble seeing or a sudden change in vision. F - Face. Signs are sudden weakness or numbness of the face, or the face or eyelid drooping on one side. A - Arms. Signs are weakness or numbness in an arm. This happens suddenly and usually on one side of the body. S - Speech. Signs are sudden trouble speaking, slurred speech, or trouble understanding what people say. T - Time. Time to call emergency services. Write down what time symptoms started. You or a loved one has other signs of a stroke, such as: A sudden, severe headache with no known cause. Nausea or vomiting. Seizure. These symptoms may represent  a serious problem that is an emergency. Do not wait to see if the symptoms will go away. Get medical help right away. Call your local emergency services (911 in the [place] Do not drive yourself to the hospital. Summary You can help to prevent a stroke by eating healthy, exercising, not smoking, limiting alcohol intake, and managing any medical conditions you may have. Do  not use any products that contain nicotine or tobacco. These include cigarettes, chewing tobacco, and vaping devices, such as e-cigarettes. If you need help quitting, ask your health care provider. Remember "BE FAST" for warning signs of a stroke. Get help right away if you or a loved one has any of these signs. This information is not intended to replace advice given to you by your health care provider. Make sure you discuss any questions you have with your health care provider. Document Revised: 07/25/2019 Document Reviewed: 07/25/2019 Elsevier Patient Education  2022 [employer].

## 2020-11-21 ENCOUNTER — Encounter: Payer: Self-pay | Admitting: *Deleted

## 2020-11-21 LAB — LIPID PANEL
Chol/HDL Ratio: 4.8 ratio — ABNORMAL HIGH (ref 0.0–4.4)
Cholesterol, Total: 169 mg/dL (ref 100–199)
HDL: 35 mg/dL — ABNORMAL LOW (ref >39)
LDL Chol Calc (NIH): 79 mg/dL (ref 0–99)
Triglycerides: 338 mg/dL — ABNORMAL HIGH (ref 0–149)
VLDL Cholesterol Cal: 55 mg/dL — ABNORMAL HIGH (ref 5–40)

## 2020-11-22 ENCOUNTER — Telehealth: Payer: Self-pay | Admitting: Neurology

## 2020-11-22 NOTE — Telephone Encounter (Signed)
[employer] no Chestine Spore, sent message to Lupita Leash she will reach out to the patient to schedule.

## 2020-11-26 ENCOUNTER — Ambulatory Visit: Payer: BC Managed Care – PPO | Admitting: Cardiology

## 2020-11-26 ENCOUNTER — Encounter: Payer: Self-pay | Admitting: Cardiology

## 2020-11-26 ENCOUNTER — Other Ambulatory Visit: Payer: Self-pay

## 2020-11-26 VITALS — BP 118/74 | HR 72 | Ht 66.0 in | Wt 171.6 lb

## 2020-11-26 DIAGNOSIS — Q2112 Patent foramen ovale: Secondary | ICD-10-CM | POA: Diagnosis not present

## 2020-11-26 DIAGNOSIS — I10 Essential (primary) hypertension: Secondary | ICD-10-CM | POA: Diagnosis not present

## 2020-11-26 DIAGNOSIS — Z87891 Personal history of nicotine dependence: Secondary | ICD-10-CM

## 2020-11-26 DIAGNOSIS — Z8249 Family history of ischemic heart disease and other diseases of the circulatory system: Secondary | ICD-10-CM

## 2020-11-26 DIAGNOSIS — I6340 Cerebral infarction due to embolism of unspecified cerebral artery: Secondary | ICD-10-CM

## 2020-11-26 MED ORDER — AMOXICILLIN 500 MG PO TABS: 2000.0000 mg | ORAL_TABLET | ORAL | 0 refills | Status: DC | PRN

## 2020-11-26 NOTE — Patient Outreach (Signed)
[facility]) Care Management  11/26/2020  Vicki Gonzales 04-26-1965 [id number]   No telephone outreach to patient. Dr. Pearlean Brownie obtained mRS on 11/20/20. MRS= 2   [name] Sarah [facility] Care Management Assistant

## 2020-11-26 NOTE — Patient Instructions (Signed)
Medication Instructions:  Your physician has recommended you make the following change in your medication:  START AMOXICILLIN 500 MG TAKE 4 TABLETS (2,000) 1 HOUR PRIOR TO DENTAL APPOINTMENTS   *If you need a refill on your cardiac medications before your next appointment, please call your pharmacy*   Lab Work: NONE If you have labs (blood work) drawn today and your tests are completely normal, you will receive your results only by: MyChart Message (if you have MyChart) OR A paper copy in the mail If you have any lab test that is abnormal or we need to change your treatment, we will call you to review the results.   Testing/Procedures: Your physician has requested that you have an echocardiogram BUBBLE STUDY. Echocardiography is a painless test that uses sound waves to create images of your heart. It provides your doctor with information about the size and shape of your heart and how well your heart's chambers and valves are working. This procedure takes approximately one hour. There are no restrictions for this procedure.    Follow-Up: At [facility], you and your health needs are our priority.  As part of our continuing mission to provide you with exceptional heart care, we have created designated Provider Care Teams.  These Care Teams include your primary Cardiologist (physician) and Advanced Practice Providers (APPs -  Physician Assistants and Nurse Practitioners) who all work together to provide you with the care you need, when you need it.  We recommend signing up for the patient portal called "MyChart".  Sign up information is provided on this After Visit Summary.  MyChart is used to connect with patients for Virtual Visits (Telemedicine).  Patients are able to view lab/test results, encounter notes, upcoming appointments, etc.  Non-urgent messages can be sent to your provider as well.   To learn more about what you can do with MyChart, go to [email or web address].    Your next  appointment:   1 year(s)  The format for your next appointment:   In Person  Provider:   Georgie Chard, [facility]

## 2020-12-04 ENCOUNTER — Ambulatory Visit (HOSPITAL_COMMUNITY)
Admission: RE | Admit: 2020-12-04 | Discharge: 2020-12-04 | Disposition: A | Discharge status: Home / Self Care | Payer: BC Managed Care – PPO | Source: Ambulatory Visit | Attending: Neurology | Admitting: Neurology

## 2020-12-04 DIAGNOSIS — I63411 Cerebral infarction due to embolism of right middle cerebral artery: Secondary | ICD-10-CM | POA: Insufficient documentation

## 2020-12-04 NOTE — Progress Notes (Signed)
Carotid duplex bilateral and TCD w/ bubble study completed.   Please see CV Proc for preliminary results.   Rhonda Linan, RDMS, RVT  

## 2020-12-13 ENCOUNTER — Other Ambulatory Visit: Payer: Self-pay | Admitting: *Deleted

## 2020-12-13 ENCOUNTER — Telehealth: Payer: Self-pay | Admitting: *Deleted

## 2020-12-13 MED ORDER — TICAGRELOR 90 MG PO TABS: 90.0000 mg | ORAL_TABLET | Freq: Two times a day (BID) | ORAL | 0 refills | Status: DC

## 2020-12-13 MED ORDER — ATORVASTATIN CALCIUM 80 MG PO TABS: 80.0000 mg | ORAL_TABLET | Freq: Every day | ORAL | 0 refills | Status: DC

## 2020-12-13 NOTE — Telephone Encounter (Signed)
Note opened in error.

## 2021-01-01 ENCOUNTER — Other Ambulatory Visit (HOSPITAL_COMMUNITY): Payer: Self-pay | Admitting: Interventional Radiology

## 2021-01-01 ENCOUNTER — Telehealth (HOSPITAL_COMMUNITY): Payer: Self-pay

## 2021-01-01 DIAGNOSIS — I6521 Occlusion and stenosis of right carotid artery: Secondary | ICD-10-CM

## 2021-01-01 NOTE — Telephone Encounter (Signed)
Called to schedule cta head/neck, no answer, left vm. AW 

## 2021-01-02 ENCOUNTER — Encounter (HOSPITAL_BASED_OUTPATIENT_CLINIC_OR_DEPARTMENT_OTHER): Payer: Self-pay | Admitting: Family Medicine

## 2021-01-02 ENCOUNTER — Other Ambulatory Visit: Payer: Self-pay

## 2021-01-02 ENCOUNTER — Ambulatory Visit (INDEPENDENT_AMBULATORY_CARE_PROVIDER_SITE_OTHER): Payer: BC Managed Care – PPO | Admitting: Family Medicine

## 2021-01-02 VITALS — BP 110/84 | HR 68 | Ht 66.0 in | Wt 173.0 lb

## 2021-01-02 DIAGNOSIS — F32A Depression, unspecified: Secondary | ICD-10-CM | POA: Diagnosis not present

## 2021-01-02 DIAGNOSIS — E785 Hyperlipidemia, unspecified: Secondary | ICD-10-CM

## 2021-01-02 DIAGNOSIS — I10 Essential (primary) hypertension: Secondary | ICD-10-CM

## 2021-01-02 DIAGNOSIS — E039 Hypothyroidism, unspecified: Secondary | ICD-10-CM

## 2021-01-02 MED ORDER — ESCITALOPRAM OXALATE 5 MG PO TABS: 5.0000 mg | ORAL_TABLET | Freq: Every day | ORAL | 1 refills | Status: DC

## 2021-01-02 NOTE — Assessment & Plan Note (Signed)
History of anxiety and depression, symptoms not adequately controlled at present Will transition patient to Lexapro, starting at lower dose and titrating as needed Plan for follow-up in about 2 weeks after SSRI initiation to monitor progress with medication If tolerating well, continue with medication, consider titration to 10 mg dose Did discuss potential for this to contribute to sleep disturbances and to consider taking medication in the morning to limit these effects Will also refer for counseling/therapy which can be of benefit related to anxiety and depression as well as insomnia

## 2021-01-02 NOTE — Patient Instructions (Signed)
°  Medication Instructions:  Your physician has recommended you make the following change in your medication:  -- Stop Paroxetine -- START Escitalopram (lexapro) 5 mg - Take 1 tablet by mouth daily --If you need a refill on any your medications before your next appointment, please call your pharmacy first. If no refills are authorized on file call the office.-- Referrals/Procedures/Imaging: A referral has been placed for you to [facility] for evaluation and treatment. Someone from the scheduling department will be in contact with you in regards to coordinating your consultation. If you do not hear from any of the schedulers within 7-10 business days please give their office a call.  [facility]  [address], [place], [place] [address] Phone: [phone]  Follow-Up: Your next appointment:   Your physician recommends that you schedule a follow-up appointment in: 2 WEEKS with Dr. [name]  You will receive a text message or e-mail with a link to a survey about your care and experience with [place] today! We would greatly appreciate your feedback!   Thanks for letting us be apart of your health journey!!  Primary Care and Sports Medicine   Dr. Ceasar Mons Peru   We encourage you to activate your patient portal called "MyChart".  Sign up information is provided on this After Visit Summary.  MyChart is used to connect with patients for Virtual Visits (Telemedicine).  Patients are able to view lab/test results, encounter notes, upcoming appointments, etc.  Non-urgent messages can be sent to your provider as well. To learn more about what you can do with MyChart, please visit --  [email or web address].

## 2021-01-02 NOTE — Assessment & Plan Note (Signed)
Most recent TSH was within normal limits, will continue with current dosing of levothyroxine, plan for recheck of TSH in the coming months

## 2021-01-02 NOTE — Progress Notes (Signed)
New Patient Office Visit  Subjective:  Patient ID: Vicki Gonzales, female    DOB: Nov 19, 1965  Age: 55 y.o. MRN: [id number]  CC:  Chief Complaint  Patient presents with   Establish Care    Prior PCP - [facility]   Insomnia    Patient presents today with concerns about interrupted sleep patterns. She suffered from a stroke in August and states that when she was discharged from the hospital she noticed that she will fall asleep super early and be up all night.   Medication Problem    Patient is currently taking Paroxetine for anxiety, she would like to know if there are alternatives to this medication as she states she has been on it for a while and feels it is no longer effective for her     HPI Vicki Gonzales is a 55 year old female presenting to establish in clinic.  She has current concerns as outlined above.  Past medical history significant for anxiety/depression, hypothyroidism, hypertension, recent stroke.  Hypertension: Current medications include amlodipine and lisinopril.  Has been tolerating medications well.  Denies any current issues with chest pain, headaches, lightheadedness or dizziness.  Recent stroke: In August had symptoms of stroke, presented to emergency department and was admitted for further evaluation and treatment.  She was subsequently found to have PFO which has been closed.  She does follow with cardiology and neurology.  Current medications include atorvastatin, Brilinta, aspirin 81 mg.  Residual symptoms include some left sided numbness.  Denies any motor weakness or balance issues/gait problems.  Anxiety/depression: Reports history of these.  Indicates that she was on Prozac at 1 point in the past.  Most recently she has been on paroxetine for about 1 year.  Indicates that she does not feel that the paroxetine is adequately controlling her symptoms.  She is requesting alternative medication today.  Feels that she did have adequate relief  of symptoms with use of Prozac in the past.  Has not done counseling before, however is interested in initiating this.  Insomnia: Reports primary issues related to staying asleep.  Often she will find herself waking during the night and unable to fall back asleep.  Has not had any specific treatment in the past.  Hypothyroidism: Currently managed on levothyroxine 50 mcg, no recent change in dose.  Last labs were completed about 4 months ago and were within normal limits.  Patient is currently not working, but wants to return to work in the near future.  She is originally from near [place], [place].  She has been living in the area for about 26 years.  She enjoys walking although she has not been able to do it as often as before her stroke, spending time with family, going to church.  Past Medical History:  Diagnosis Date   ADHD    Anxiety    Carpal tunnel syndrome on right    Depression    HTN (hypertension)    Hypothyroidism    Migraines    Spondylosis    Stroke [facility])     Past Surgical History:  Procedure Laterality Date   ANTERIOR CRUCIATE LIGAMENT REPAIR Left 2021   APPENDECTOMY     BUBBLE STUDY  08/17/2020   Procedure: BUBBLE STUDY;  Surgeon: Jodelle Red, MD;  Location: [facility] ENDOSCOPY;  Service: Cardiovascular;;   FALSE ANEURYSM REPAIR Right 08/15/2020   Procedure: REPAIR RIGHT FEMORAL PSEUDOANEURYSM;  Surgeon: Nada Libman, MD;  Location: [facility] OR;  Service: Vascular;  Laterality:  Right;   IR ANGIO INTRA EXTRACRAN SEL COM CAROTID INNOMINATE UNI L MOD SED  08/14/2020   IR ANGIO INTRA EXTRACRAN SEL INTERNAL CAROTID UNI R MOD SED  08/14/2020   IR ANGIO VERTEBRAL SEL SUBCLAVIAN INNOMINATE BILAT MOD SED  08/14/2020   IR CT HEAD LTD  08/14/2020   IR CT HEAD LTD  08/14/2020   IR INTRA CRAN STENT  08/14/2020   IR INTRAVSC STENT CERV CAROTID W/O EMB-PROT MOD SED INC ANGIO  08/14/2020   IR RADIOLOGIST EVAL & MGMT  09/24/2020   PATENT FORAMEN OVALE(PFO) CLOSURE N/A 10/24/2020   Procedure: PATENT  FORAMEN OVALE (PFO) CLOSURE;  Surgeon: Tonny Bollman, MD;  Location: [facility] INVASIVE CV LAB;  Service: Cardiovascular;  Laterality: N/A;   RADIOLOGY WITH ANESTHESIA N/A 08/14/2020   Procedure: IR WITH ANESTHESIA;  Surgeon: Julieanne Cotton, MD;  Location: [facility] OR;  Service: Radiology;  Laterality: N/A;   TEE WITHOUT CARDIOVERSION N/A 08/17/2020   Procedure: TRANSESOPHAGEAL ECHOCARDIOGRAM (TEE);  Surgeon: Jodelle Red, MD;  Location: [facility] ENDOSCOPY;  Service: Cardiovascular;  Laterality: N/A;   TUBAL LIGATION      Family History  Problem Relation Age of Onset   Dementia Mother    Congestive Heart Failure Father     Social History   Socioeconomic History   Marital status: Married    Spouse name: Not on file   Number of children: 5   Years of education: 12   Highest education level: High school graduate  Occupational History   Occupation: Retired  Tobacco Use   Smoking status: Former   Smokeless tobacco: Never   Tobacco comments:    Quit smoking in her 20's.  Substance and Sexual Activity   Alcohol use: Not Currently   Drug use: Never   Sexual activity: Not on file  Other Topics Concern   Not on file  Social History Narrative   Lives with family.   Right-handed.   Caffeine use: one cup coffee each morning.   Social Determinants of Health   Financial Resource Strain: Not on file  Food Insecurity: Not on file  Transportation Needs: Not on file  Physical Activity: Not on file  Stress: Not on file  Social Connections: Not on file  Intimate Partner Violence: Not on file    Objective:   Today's Vitals: BP 110/84    Pulse 68    Ht 5\' 6"  (1.676 m)    Wt 173 lb (78.5 kg)    LMP 04/07/2019 (Approximate)    SpO2 99%    BMI 27.92 kg/m   Physical Exam  55 year old female in no acute distress Cardiovascular exam with regular rate and rhythm Lungs clear to auscultation bilaterally  Assessment & Plan:   Problem List Items Addressed This Visit       Cardiovascular  and Mediastinum   Hypertension    Blood pressure at goal in office today Will continue with current regimen of amlodipine and lisinopril Continue close follow-up with cardiology        Endocrine   Hypothyroidism    Most recent TSH was within normal limits, will continue with current dosing of levothyroxine, plan for recheck of TSH in the coming months        Other   Dyslipidemia - Primary    Currently manages with use of atorvastatin Has had occasional myalgias, if continuing to have issues, may need referral to lipid clinic      Depression    History of anxiety and depression, symptoms not adequately  controlled at present Will transition patient to Lexapro, starting at lower dose and titrating as needed Plan for follow-up in about 2 weeks after SSRI initiation to monitor progress with medication If tolerating well, continue with medication, consider titration to 10 mg dose Did discuss potential for this to contribute to sleep disturbances and to consider taking medication in the morning to limit these effects Will also refer for counseling/therapy which can be of benefit related to anxiety and depression as well as insomnia      Relevant Medications   escitalopram (LEXAPRO) 5 MG tablet    Outpatient Encounter Medications as of 01/02/2021  Medication Sig   acetaminophen (TYLENOL) 325 MG tablet Take 325 mg by mouth every 6 (six) hours as needed for moderate pain.   amLODipine (NORVASC) 5 MG tablet Take 1 tablet (5 mg total) by mouth daily.   amoxicillin (AMOXIL) 500 MG tablet Take 4 tablets (2,000 mg total) by mouth as needed (DENTAL PROCEDURES).   aspirin 81 MG chewable tablet Chew 1 tablet (81 mg total) by mouth daily.   atorvastatin (LIPITOR) 80 MG tablet Take 1 tablet (80 mg total) by mouth daily.   calcium carbonate (TUMS - DOSED IN MG ELEMENTAL CALCIUM) 500 MG chewable tablet Chew 0.5 tablets by mouth daily as needed for indigestion or heartburn.   co-enzyme Q-10 30 MG  capsule Take 7 capsules (210 mg total) by mouth 3 (three) times daily.   escitalopram (LEXAPRO) 5 MG tablet Take 1 tablet (5 mg total) by mouth daily.   levothyroxine (SYNTHROID) 50 MCG tablet Take 1 tablet (50 mcg total) by mouth every morning.   lisinopril (ZESTRIL) 5 MG tablet Take 1 tablet (5 mg total) by mouth daily.   pantoprazole (PROTONIX) 40 MG tablet Take 40 mg by mouth daily.   ticagrelor (BRILINTA) 90 MG TABS tablet Take 1 tablet (90 mg total) by mouth 2 (two) times daily.   [DISCONTINUED] PARoxetine (PAXIL) 10 MG tablet Take 10 mg by mouth daily.   No facility-administered encounter medications on file as of 01/02/2021.    Follow-up: Return in about 2 weeks (around 01/16/2021).   Maila Dukes J [name], MD

## 2021-01-02 NOTE — Assessment & Plan Note (Signed)
Blood pressure at goal in office today Will continue with current regimen of amlodipine and lisinopril Continue close follow-up with cardiology

## 2021-01-02 NOTE — Assessment & Plan Note (Signed)
Currently manages with use of atorvastatin Has had occasional myalgias, if continuing to have issues, may need referral to lipid clinic

## 2021-01-03 ENCOUNTER — Telehealth (HOSPITAL_COMMUNITY): Payer: Self-pay

## 2021-01-03 NOTE — Telephone Encounter (Signed)
Called to schedule cta head/neck. Pt just had an ultrasound done and wants Deveshwar to view that first. He is off this week but I will have him take a look when he returns and give her a call. She agreed with this plan. AW

## 2021-01-16 ENCOUNTER — Other Ambulatory Visit: Payer: Self-pay

## 2021-01-16 ENCOUNTER — Encounter (HOSPITAL_BASED_OUTPATIENT_CLINIC_OR_DEPARTMENT_OTHER): Payer: Self-pay | Admitting: Family Medicine

## 2021-01-16 ENCOUNTER — Ambulatory Visit (INDEPENDENT_AMBULATORY_CARE_PROVIDER_SITE_OTHER): Payer: BC Managed Care – PPO | Admitting: Family Medicine

## 2021-01-16 VITALS — BP 100/62 | HR 81 | Ht 66.0 in | Wt 169.0 lb

## 2021-01-16 DIAGNOSIS — F32A Depression, unspecified: Secondary | ICD-10-CM

## 2021-01-16 MED ORDER — ESCITALOPRAM OXALATE 10 MG PO TABS: 10.0000 mg | ORAL_TABLET | Freq: Every day | ORAL | 1 refills | Status: DC

## 2021-01-16 NOTE — Patient Instructions (Signed)
°  Medication Instructions:  Your physician has recommended you make the following change in your medication:  -- INCREASE Escitalopram (Lexapro) to 10 mg - Take 1 tablet by mouth daily --If you need a refill on any your medications before your next appointment, please call your pharmacy first. If no refills are authorized on file call the office.-- Follow-Up: Your next appointment:   Your physician recommends that you schedule a follow-up appointment in: 4 WEEKS with Dr. [name]  You will receive a text message or e-mail with a link to a survey about your care and experience with [place] today! We would greatly appreciate your feedback!   Thanks for letting us be apart of your health journey!!  Primary Care and Sports Medicine   Dr. Ceasar Mons [place]   We encourage you to activate your patient portal called "MyChart".  Sign up information is provided on this After Visit Summary.  MyChart is used to connect with patients for Virtual Visits (Telemedicine).  Patients are able to view lab/test results, encounter notes, upcoming appointments, etc.  Non-urgent messages can be sent to your provider as well. To learn more about what you can do with MyChart, please visit --  [email or web address].

## 2021-01-16 NOTE — Progress Notes (Signed)
° ° °  Procedures performed today:    None.  Independent interpretation of notes and tests performed by another provider:   None.  Brief History, Exam, Impression, and Recommendations:    BP 100/62    Pulse 81    Ht 5\' 6"  (1.676 m)    Wt 169 lb (76.7 kg)    LMP 04/07/2019 (Approximate)    SpO2 100%    BMI 27.28 kg/m   Depression Has been doing well with escitalopram 5 mg, denies any significant GI symptoms Also feels that sleep has generally improved She has been contacted by [facility] for counseling services, has not yet arranged establishing visit Feels the current dose has helped with symptoms, would be interested in titrating to 10 mg dose which is reasonable Prescription for 10 mg dose sent to pharmacy on file Plan for follow-up in about 4 weeks to monitor progress She is planning to leave for [place] shortly after February 15 in order to be with her daughter who is pregnant   ___________________________________________ Vicki Gonzales de February 17, MD, ABFM, CAQSM Primary Care and Sports Medicine [facility]

## 2021-01-16 NOTE — Assessment & Plan Note (Signed)
Has been doing well with escitalopram 5 mg, denies any significant GI symptoms Also feels that sleep has generally improved She has been contacted by [facility] for counseling services, has not yet arranged establishing visit Feels the current dose has helped with symptoms, would be interested in titrating to 10 mg dose which is reasonable Prescription for 10 mg dose sent to pharmacy on file Plan for follow-up in about 4 weeks to monitor progress She is planning to leave for [place] shortly after February 15 in order to be with her daughter who is pregnant

## 2021-01-19 ENCOUNTER — Other Ambulatory Visit: Payer: Self-pay | Admitting: Neurology

## 2021-01-29 ENCOUNTER — Other Ambulatory Visit (HOSPITAL_BASED_OUTPATIENT_CLINIC_OR_DEPARTMENT_OTHER): Payer: Self-pay | Admitting: Family Medicine

## 2021-01-29 ENCOUNTER — Telehealth: Payer: Self-pay | Admitting: Neurology

## 2021-01-29 ENCOUNTER — Other Ambulatory Visit: Payer: Self-pay | Admitting: Neurology

## 2021-01-29 MED ORDER — LISINOPRIL 5 MG PO TABS: 5.0000 mg | ORAL_TABLET | Freq: Every day | ORAL | 0 refills | Status: DC

## 2021-01-29 NOTE — Telephone Encounter (Signed)
Patient also called office.  I was able to speak with her.  She is requesting refills on her lisinopril, Norvasc, and atorvastatin.  I advised her that we recommended these RXs come from her PCP. We gave her a one month supply of lipitor to get her to her PCP appointment. Patient will contact her PCP office for these refills.

## 2021-01-29 NOTE — Telephone Encounter (Signed)
Keisha from [facility] called requesting more information in order to refill pt's medication. Please advise at [phone].

## 2021-01-29 NOTE — Telephone Encounter (Signed)
I called [facility]. I spoke with Deanna Artis, pharmacist. Patient told them that Dr. Terrace Arabia is her PCP and we would need to refill all of her medications.  I advised Deanna Artis that we are not patient's PCP and if she needs refill on medications she should contact that office.

## 2021-01-30 ENCOUNTER — Encounter (HOSPITAL_BASED_OUTPATIENT_CLINIC_OR_DEPARTMENT_OTHER): Payer: Self-pay | Admitting: Family Medicine

## 2021-01-30 MED ORDER — ATORVASTATIN CALCIUM 80 MG PO TABS: 80.0000 mg | ORAL_TABLET | Freq: Every day | ORAL | 2 refills | Status: DC

## 2021-02-04 ENCOUNTER — Other Ambulatory Visit: Payer: Self-pay

## 2021-02-04 ENCOUNTER — Ambulatory Visit (HOSPITAL_COMMUNITY)
Admission: RE | Admit: 2021-02-04 | Discharge: 2021-02-04 | Disposition: A | Discharge status: Home / Self Care | Payer: BC Managed Care – PPO | Source: Ambulatory Visit | Attending: Interventional Radiology | Admitting: Interventional Radiology

## 2021-02-04 DIAGNOSIS — I6521 Occlusion and stenosis of right carotid artery: Secondary | ICD-10-CM | POA: Insufficient documentation

## 2021-02-07 ENCOUNTER — Telehealth: Payer: Self-pay | Admitting: Adult Health

## 2021-02-07 NOTE — Telephone Encounter (Signed)
LVM and sent mychart msg informing pt of r/s needed- NP out. 

## 2021-02-09 ENCOUNTER — Other Ambulatory Visit (HOSPITAL_BASED_OUTPATIENT_CLINIC_OR_DEPARTMENT_OTHER): Payer: Self-pay | Admitting: Family Medicine

## 2021-02-12 NOTE — Telephone Encounter (Signed)
Patient is on a trial taper, follow up is scheduled to follow up on 02/08

## 2021-02-13 ENCOUNTER — Other Ambulatory Visit: Payer: Self-pay

## 2021-02-13 ENCOUNTER — Encounter (HOSPITAL_BASED_OUTPATIENT_CLINIC_OR_DEPARTMENT_OTHER): Payer: Self-pay

## 2021-02-13 ENCOUNTER — Encounter (HOSPITAL_BASED_OUTPATIENT_CLINIC_OR_DEPARTMENT_OTHER): Payer: Self-pay | Admitting: Family Medicine

## 2021-02-13 ENCOUNTER — Ambulatory Visit (INDEPENDENT_AMBULATORY_CARE_PROVIDER_SITE_OTHER): Payer: BC Managed Care – PPO | Admitting: Family Medicine

## 2021-02-13 VITALS — BP 130/88 | HR 64 | Ht 66.0 in | Wt 175.2 lb

## 2021-02-13 DIAGNOSIS — F32A Depression, unspecified: Secondary | ICD-10-CM | POA: Diagnosis not present

## 2021-02-13 DIAGNOSIS — I10 Essential (primary) hypertension: Secondary | ICD-10-CM | POA: Diagnosis not present

## 2021-02-13 MED ORDER — ESCITALOPRAM OXALATE 10 MG PO TABS: 10.0000 mg | ORAL_TABLET | Freq: Every day | ORAL | 0 refills | Status: DC

## 2021-02-13 NOTE — Assessment & Plan Note (Signed)
Blood pressure at goal in office today, borderline She does indicate that she has been out of amlodipine recently, wondering if she needs to continue with medication or not Has continued with lisinopril Does not check blood pressure regularly at home Denies any issues with chest pain, headaches, lightheadedness or dizziness Given blood pressure in the office today, feel that it is reasonable to hold amlodipine for now, can continue with lisinopril Recommend checking blood pressure intermittently at home to monitor Discussed DASH diet, working towards healthy, gradual weight loss

## 2021-02-13 NOTE — Patient Instructions (Signed)
°  Medication Instructions:  °Your physician recommends that you continue on your current medications as directed. Please refer to the Current Medication list given to you today. °--If you need a refill on any your medications before your next appointment, please call your pharmacy first. If no refills are authorized on file call the office.-- ° °Follow-Up: °Your next appointment:   °Your physician recommends that you schedule a follow-up appointment in: 6-8 WEEKS with Dr. de [place] ° °You will receive a text message or e-mail with a link to a survey about your care and experience with us today! We would greatly appreciate your feedback!  ° °Thanks for letting us be apart of your health journey!!  °Primary Care and Sports Medicine  ° °Dr. Raymond de Cuba  ° °We encourage you to activate your patient portal called "MyChart".  Sign up information is provided on this After Visit Summary.  MyChart is used to connect with patients for Virtual Visits (Telemedicine).  Patients are able to view lab/test results, encounter notes, upcoming appointments, etc.  Non-urgent messages can be sent to your provider as well. To learn more about what you can do with MyChart, please visit --  [email or web address]   ° °

## 2021-02-13 NOTE — Progress Notes (Signed)
° ° °  Procedures performed today:    None.  Independent interpretation of notes and tests performed by another provider:   None.  Brief History, Exam, Impression, and Recommendations:    BP 130/88    Pulse 64    Ht 5\' 6"  (1.676 m)    Wt 175 lb 3.2 oz (79.5 kg)    LMP 04/07/2019 (Approximate)    SpO2 100%    BMI 28.28 kg/m   Hypertension Blood pressure at goal in office today, borderline She does indicate that she has been out of amlodipine recently, wondering if she needs to continue with medication or not Has continued with lisinopril Does not check blood pressure regularly at home Denies any issues with chest pain, headaches, lightheadedness or dizziness Given blood pressure in the office today, feel that it is reasonable to hold amlodipine for now, can continue with lisinopril Recommend checking blood pressure intermittently at home to monitor Discussed DASH diet, working towards healthy, gradual weight loss  Depression Has been doing well with Lexapro thus far, requesting refill today Given good control of symptoms, feel that continuing with the milligram dose is reasonable, refill sent to pharmacy on file  Provided note for patient to fly with CPAP machine  Plan for follow-up in about 6 to 8 weeks to monitor progress related to blood pressure, Lexapro   ___________________________________________ Vicki Gonzales de 06/07/2019, MD, ABFM, [facility] Primary Care and Sports Medicine [facility]

## 2021-02-13 NOTE — Assessment & Plan Note (Signed)
Has been doing well with Lexapro thus far, requesting refill today Given good control of symptoms, feel that continuing with the milligram dose is reasonable, refill sent to pharmacy on file

## 2021-02-19 ENCOUNTER — Encounter (HOSPITAL_BASED_OUTPATIENT_CLINIC_OR_DEPARTMENT_OTHER): Payer: Self-pay | Admitting: Family Medicine

## 2021-02-19 ENCOUNTER — Other Ambulatory Visit: Payer: Self-pay

## 2021-02-19 ENCOUNTER — Ambulatory Visit (HOSPITAL_BASED_OUTPATIENT_CLINIC_OR_DEPARTMENT_OTHER): Payer: BC Managed Care – PPO | Admitting: Family Medicine

## 2021-02-19 DIAGNOSIS — J069 Acute upper respiratory infection, unspecified: Secondary | ICD-10-CM | POA: Diagnosis not present

## 2021-02-19 NOTE — Assessment & Plan Note (Signed)
Patient presents for evaluation of headache, sinus congestion, body aches.  Symptoms have been present for about 3 days.  Has been using Coricidin HBP and Tylenol.  Reports that Vicki Gonzales is also prescribed amoxicillin to use for dental procedures and since Vicki Gonzales had this on hand, Vicki Gonzales did take some doses of this.  Vicki Gonzales denies any fevers, no recent sick contact that Vicki Gonzales is aware of.  Has not completed at home COVID test.  No cough or shortness of breath. On exam, cardiovascular exam with regular rate and rhythm, lungs clear to auscultation bilaterally.  No significant pharyngeal erythema, no exudate, no significant cervical lymphadenopathy. Discussed that current symptoms are likely related to viral etiology. Recommend symptomatic management at this time Can continue with OTC medications Would also recommend consideration of nasal saline spray, intranasal steroid spray, instructed on proper use Would recommend against antibiotics at present as this is likely a viral infection.  Did discuss that if symptoms are not improving or if Vicki Gonzales has subsequent or worsening of symptoms over the weekend, can consider antibiotic therapy at that time. Recommend ensuring adequate rest and hydration Vicki Gonzales will complete at home COVID test and let us know if this returns positive.  Did discuss potential COVID-specific treatment if positive, also discussed potential risks and benefits related to COVID-specific treatment Plan for follow-up at previously scheduled appointment.

## 2021-02-19 NOTE — Progress Notes (Signed)
° ° °  Procedures performed today:    None.  Independent interpretation of notes and tests performed by another provider:   None.  Brief History, Exam, Impression, and Recommendations:    BP 128/88    Pulse 71    Ht 5\' 6"  (1.676 m)    Wt 172 lb (78 kg)    LMP 04/07/2019 (Approximate)    SpO2 97%    BMI 27.76 kg/m   Viral URI Patient presents for evaluation of headache, sinus congestion, body aches.  Symptoms have been present for about 3 days.  Has been using Coricidin HBP and Tylenol.  Reports that she is also prescribed amoxicillin to use for dental procedures and since she had this on hand, she did take some doses of this.  She denies any fevers, no recent sick contact that she is aware of.  Has not completed at home COVID test.  No cough or shortness of breath. On exam, cardiovascular exam with regular rate and rhythm, lungs clear to auscultation bilaterally.  No significant pharyngeal erythema, no exudate, no significant cervical lymphadenopathy. Discussed that current symptoms are likely related to viral etiology. Recommend symptomatic management at this time Can continue with OTC medications Would also recommend consideration of nasal saline spray, intranasal steroid spray, instructed on proper use Would recommend against antibiotics at present as this is likely a viral infection.  Did discuss that if symptoms are not improving or if she has subsequent or worsening of symptoms over the weekend, can consider antibiotic therapy at that time. Recommend ensuring adequate rest and hydration She will complete at home COVID test and let 06/07/2019 know if this returns positive.  Did discuss potential COVID-specific treatment if positive, also discussed potential risks and benefits related to COVID-specific treatment Plan for follow-up at previously scheduled appointment.   ___________________________________________ Andros Channing de [place], MD, ABFM, [facility]

## 2021-02-20 ENCOUNTER — Ambulatory Visit: Payer: BC Managed Care – PPO | Admitting: Adult Health

## 2021-02-25 ENCOUNTER — Encounter (HOSPITAL_BASED_OUTPATIENT_CLINIC_OR_DEPARTMENT_OTHER): Payer: Self-pay | Admitting: Family Medicine

## 2021-03-12 ENCOUNTER — Other Ambulatory Visit: Payer: Self-pay | Admitting: Neurology

## 2021-04-01 ENCOUNTER — Ambulatory Visit (INDEPENDENT_AMBULATORY_CARE_PROVIDER_SITE_OTHER): Payer: BC Managed Care – PPO | Admitting: Adult Health

## 2021-04-01 ENCOUNTER — Encounter: Payer: Self-pay | Admitting: Adult Health

## 2021-04-01 VITALS — BP 145/77 | HR 62 | Ht 66.0 in | Wt 176.0 lb

## 2021-04-01 DIAGNOSIS — I63411 Cerebral infarction due to embolism of right middle cerebral artery: Secondary | ICD-10-CM | POA: Diagnosis not present

## 2021-04-01 MED ORDER — ROSUVASTATIN CALCIUM 40 MG PO TABS: 40.0000 mg | ORAL_TABLET | Freq: Every day | ORAL | 5 refills | Status: DC

## 2021-04-01 NOTE — Progress Notes (Signed)
?[name] ?[name] ?[place]. [name] [address] ?[phone] ? ?     OFFICE FOLLOW-UP NOTE ? ?Vicki Gonzales ?Date of Birth:  Jan 25, 1965 ?Medical Record Number:  [id number]  ? ? ?Primary neurologist: Dr. Leonie Man ?PCP: [name], [name], MD ? ?Reason for visit: Stroke follow-up ? ? ?Chief Complaint  ?Patient presents with  ? Follow-up  ?  RM 3 alone ?Pt is well and stable, no new stroke concerns   ?  ? ? ?HPI:  ? ? ?Initial visit 01/21/2020 Dr. Leonie Man: [name] is a pleasant 56 year old Caucasian lady seen today for initial office follow-up visit following hospital admission for stroke in August 2022.  History is obtained from the patient and review of electronic medical records and I have personally reviewed pertinent available imaging films in PACS. ?56 yo woman with pmhx HTN, depression, migraines hypothyroidism BIB EMS on 08/12/2020 to [facility] after developing acute osnet of L facial droop and slurred speech at 0900  AM. On initial neurological examination slurred speech had resolved and she had only L NLF flattening for NIHSS = 0. tPA was initially not administered due to low NIHSS. CT head wo contrast showed hypodensity R insula and frontal operculum, ASPECTS 8, with equivocal hyperdensity of the R M1. CTA was performed which showed prox R ICA occlusion with intracranial reconstitution, finding suspicious for subocclusive embolus vs severe stenosis at the R MCA bifurcation, occlusion of L vert at origin with distal reconstitution, and CTP showing 25cc penumbra R MCA territory. Based on CTA/CTP findings Case was discussed with interventional neuroradiologist on-call who recommended administration of tPA and activation of Code IR.  After detailed discussion about the risks/benefits of both tPA and thrombectomy with the patient, including 10% risk of hemorrhage with the latter,  she gave informed consent. She had no exclusion criteria for tPA. She did have a slip  out of bed this morning and bumped her head on the dresser lightly but there was no LOC or other sx and no blood on CT head. Patient was not on anticoagulation.  Patient underwent successful rescue right proximal carotid artery angioplasty and stenting followed by mechanical thrombectomy of the occluded right M3 branch.  2D echo showed ejection fraction 6065%.  TEE showed no clot but possible PFO.  TCD bubble study confirmed [name] grade 2 right to left shunt with Valsalva.  Rope score was 6.  Lower extremity venous Dopplers were negative for DVT.  LDL cholesterol was elevated at 172 mg percent hemoglobin A1c was 6.2.  Hospital course was complicated by slight small right groin hematoma and Doppler showed 2.3 x 2 cm pseudoaneurysm vascular surgery was consulted and it was repaired patient was started on aspirin and Brilinta and did well and was discharged.  She subsequently had outpatient elective endovascular PFO closure on 10/24/2020 by Dr. Burt Knack which went well.  Patient was also suspected to have obstructive sleep apnea and after discussion agreed to participate in the sleep smart study and tested positive on the overnight NOx 3 monitor.  She was randomized to the CPAP treatment arm and states she is doing well and has been quite compliant with using it and feels better.  She is tolerating aspirin and Brilinta well without any bleeding but only minor bruising.  She is complaining of joint aches and pains on Lipitor.  She still has some diminished fine motor skills on the left and feels that her left leg is dragging at times.  She  is not able to exercise to her full capacity and gets tired easily.  At times she feels she may be off balance though she has had no falls or injuries.  She is returned back to work full-time.  He has no new complaints today. ? ? ?Update 04/01/2021 JM: Patient returns for stroke follow-up after prior visit with Dr. Leonie Man 11/20/2020. She is unaccompanied.  Overall stable from stroke  standpoint without new stroke/TIA symptoms. Continued left sided decreased sensation and occasional leg dragging with some improvement. Does report improvement of multi-tasking and having anxiety about different tasks such as driving and traveling alone. Currently taking Lexapro for anxiety with good benefit.  She does report occasional frontal headaches but feels likely from seasonal allergies and at times difficulty sleeping due to mind wandering. Compliant on aspirin, Brilinta and atorvastatin, does complain of continued myalgias on statin therapy. Tried Co-q 10 but c/o constipation and no improvement of pain therefore she stopped co-q 10.  Blood pressure today 145/77. Does not routinely monitor at home. She plans on getting back to the [facility] with hopes of weight loss. She has since established care with new PCP Dr. [name] and closely followed by cardiology. Per cards OV note, recommended continuation of Brilinta for 6 months post PFO closure and then aspirin indefinitely.  TCD negative for PFO, carotid ultrasound he had right ICA stent. MRA head 01/2021 stable - followed by Dr. Estanislado Pandy.  She has completed sleep smart study trial and reports continue nightly CPAP use.  No further concerns at this time. ? ? ? ? ?ROS:   ?14 system review of systems is positive for those listed in HPI and all other systems negative ? ?PMH:  ?Past Medical History:  ?Diagnosis Date  ? ADHD   ? Anxiety   ? Carpal tunnel syndrome on right   ? Depression   ? HTN (hypertension)   ? Hypothyroidism   ? Migraines   ? Spondylosis   ? Stroke [facility])   ? ? ?Social History:  ?Social History  ? ?Socioeconomic History  ? Marital status: Married  ?  Spouse name: Not on file  ? Number of children: 5  ? Years of education: 28  ? Highest education level: High school graduate  ?Occupational History  ? Occupation: Retired  ?Tobacco Use  ? Smoking status: Former  ? Smokeless tobacco: Never  ? Tobacco comments:  ?  Quit smoking in her 20's.  ?Substance and  Sexual Activity  ? Alcohol use: Not Currently  ? Drug use: Never  ? Sexual activity: Not on file  ?Other Topics Concern  ? Not on file  ?Social History Narrative  ? Lives with family.  ? Right-handed.  ? Caffeine use: one cup coffee each morning.  ? ?Social Determinants of Health  ? ?Financial Resource Strain: Not on file  ?Food Insecurity: Not on file  ?Transportation Needs: Not on file  ?Physical Activity: Not on file  ?Stress: Not on file  ?Social Connections: Not on file  ?Intimate Partner Violence: Not on file  ? ? ?Medications:   ?Current Outpatient Medications on File Prior to Visit  ?Medication Sig Dispense Refill  ? acetaminophen (TYLENOL) 325 MG tablet Take 325 mg by mouth every 6 (six) hours as needed for moderate pain.    ? amLODipine (NORVASC) 5 MG tablet Take 1 tablet (5 mg total) by mouth daily. 90 tablet 3  ? amoxicillin (AMOXIL) 500 MG tablet Take 4 tablets (2,000 mg total) by mouth as needed (  DENTAL PROCEDURES). 8 tablet 0  ? aspirin 81 MG chewable tablet Chew 1 tablet (81 mg total) by mouth daily.    ? atorvastatin (LIPITOR) 80 MG tablet Take 1 tablet (80 mg total) by mouth daily. 30 tablet 2  ? BRILINTA 90 MG TABS tablet TAKE 1 TABLET BY MOUTH 2 TIMES DAILY. 180 tablet 0  ? co-enzyme Q-10 30 MG capsule Take 7 capsules (210 mg total) by mouth 3 (three) times daily. 1 capsule 1  ? escitalopram (LEXAPRO) 10 MG tablet Take 1 tablet (10 mg total) by mouth daily. 90 tablet 0  ? levothyroxine (SYNTHROID) 50 MCG tablet Take 1 tablet (50 mcg total) by mouth every morning. 30 tablet 0  ? lisinopril (ZESTRIL) 5 MG tablet Take 1 tablet (5 mg total) by mouth daily. 90 tablet 0  ? ?No current facility-administered medications on file prior to visit.  ? ? ?Allergies:   ?Allergies  ?Allergen Reactions  ? Keflex [Cephalexin] Itching  ? ? ?Physical Exam ?Today's Vitals  ? 04/01/21 0735  ?BP: (!) 145/77  ?Pulse: 62  ?Weight: 176 lb (79.8 kg)  ?Height: 5\' 6"  (1.676 m)  ? ?Body mass index is 28.41 kg/m?.  ? ?General:  Mildly obese very pleasant middle-age Caucasian lady, seated, in no evident distress ?Head: head normocephalic and atraumatic.  ?Neck: supple with no carotid or supraclavicular bruits ?Cardiovascular: re

## 2021-04-01 NOTE — Patient Instructions (Addendum)
Continue aspirin 81 mg daily ans start Crestor 40mg  daily for secondary stroke prevention  ? ?Please ensure you stop atorvastatin at this point due to muscle pains  ? ?Continue Brilinta per cardiology recommendations ? ?Continue to follow with Dr. for carotid stents monitoring and management ? ?Continue to follow up with PCP regarding cholesterol and blood pressure management  ?Maintain strict control of hypertension with blood pressure goal below 130/90 and cholesterol with LDL cholesterol (bad cholesterol) goal below 70 mg/dL.  ? ?Signs of a Stroke? Follow the BEFAST method:  ?Balance Watch for a sudden loss of balance, trouble with coordination or vertigo ?Eyes Is there a sudden loss of vision in one or both eyes? Or double vision?  ?Face: Ask the person to smile. Does one side of the face droop or is it numb?  ?Arms: Ask the person to raise both arms. Does one arm drift downward? Is there weakness or numbness of a leg? ?Speech: Ask the person to repeat a simple phrase. Does the speech sound slurred/strange? Is the person confused ? ?Time: If you observe any of these signs, call 911. ? ? ? ? ? ? ? ?Thank you for coming to see Vicki Gonzales at [facility]. I hope we have been able to provide you high quality care today. ? ?You may receive a patient satisfaction survey over the next few weeks. We would appreciate your feedback and comments so that we may continue to improve ourselves and the health of our patients. ? ?

## 2021-04-10 ENCOUNTER — Encounter (HOSPITAL_BASED_OUTPATIENT_CLINIC_OR_DEPARTMENT_OTHER): Payer: Self-pay | Admitting: Family Medicine

## 2021-04-10 ENCOUNTER — Ambulatory Visit (INDEPENDENT_AMBULATORY_CARE_PROVIDER_SITE_OTHER): Payer: BC Managed Care – PPO | Admitting: Family Medicine

## 2021-04-10 VITALS — BP 132/70 | HR 60 | Temp 97.7°F | Ht 66.0 in | Wt 178.2 lb

## 2021-04-10 DIAGNOSIS — Q2112 Patent foramen ovale: Secondary | ICD-10-CM | POA: Diagnosis not present

## 2021-04-10 DIAGNOSIS — I10 Essential (primary) hypertension: Secondary | ICD-10-CM

## 2021-04-10 DIAGNOSIS — Z0000 Encounter for general adult medical examination without abnormal findings: Secondary | ICD-10-CM | POA: Diagnosis not present

## 2021-04-10 DIAGNOSIS — E039 Hypothyroidism, unspecified: Secondary | ICD-10-CM | POA: Diagnosis not present

## 2021-04-10 DIAGNOSIS — H9192 Unspecified hearing loss, left ear: Secondary | ICD-10-CM | POA: Insufficient documentation

## 2021-04-10 DIAGNOSIS — R0981 Nasal congestion: Secondary | ICD-10-CM | POA: Insufficient documentation

## 2021-04-10 DIAGNOSIS — F419 Anxiety disorder, unspecified: Secondary | ICD-10-CM | POA: Insufficient documentation

## 2021-04-10 NOTE — Assessment & Plan Note (Signed)
Reports that she has had chronic intermittent sinus congestion, primarily affecting the left sinuses with some pressure noted.  She also indicates having what she feels is some hearing loss in left ear. ?We will place referral to ENT for further evaluation and recommendations ?

## 2021-04-10 NOTE — Assessment & Plan Note (Signed)
Has been noting ongoing issues as well as associated sinus congestion ?We will refer to ENT for further evaluation and recommendations ?

## 2021-04-10 NOTE — Patient Instructions (Signed)
Stroke Prevention Some medical conditions and lifestyle choices can lead to a higher risk for a stroke. You can help to prevent a stroke by eating healthy foods and exercising. It also helps to not smoke and to manage any health problems youmay have. How can this condition affect me? A stroke is an emergency. It should be treated right away. A stroke can lead to brain damage or threaten your life. There is a better chance of surviving andgetting better after a stroke if you get medical help right away. What can increase my risk? The following medical conditions may increase your risk of a stroke: Diseases of the heart and blood vessels (cardiovascular disease). High blood pressure (hypertension). Diabetes. High cholesterol. Sickle cell disease. Problems with blood clotting. Being very overweight. Sleeping problems (obstructivesleep apnea). Other risk factors include: Being older than age 60. A history of blood clots, stroke, or mini-stroke (TIA). Race, ethnic background, or a family history of stroke. Smoking or using tobacco products. Taking birth control pills, especially if you smoke. Heavy alcohol and drug use. Not being active. What actions can I take to prevent this? Manage your health conditions High cholesterol. Eat a healthy diet. If this is not enough to manage your cholesterol, you may need to take medicines. Take medicines as told by your doctor. High blood pressure. Try to keep your blood pressure below 130/80. If your blood pressure cannot be managed through a healthy diet and regular exercise, you may need to take medicines. Take medicines as told by your doctor. Ask your doctor if you should check your blood pressure at home. Have your blood pressure checked every year. Diabetes. Eat a healthy diet and get regular exercise. If your blood sugar (glucose) cannot be managed through diet and exercise, you may need to take medicines. Take medicines as told by your  doctor. Talk to your doctor about getting checked for sleeping problems. Signs of a problem can include: Snoring a lot. Feeling very tired. Make sure that you manage any other conditions you have. Nutrition  Follow instructions from your doctor about what to eat or drink. You may be told to: Eat and drink fewer calories each day. Limit how much salt (sodium) you use to 1,500 milligrams (mg) each day. Use only healthy fats for cooking, such as olive oil, canola oil, and sunflower oil. Eat healthy foods. To do this: Choose foods that are high in fiber. These include whole grains, and fresh fruits and vegetables. Eat at least 5 servings of fruits and vegetables a day. Try to fill one-half of your plate with fruits and vegetables at each meal. Choose low-fat (lean) proteins. These include low-fat cuts of meat, chicken without skin, fish, tofu, beans, and nuts. Eat low-fat dairy products. Avoid foods that: Are high in salt. Have saturated fat. Have trans fat. Have cholesterol. Are processed or pre-made. Count how many carbohydrates you eat and drink each day.  Lifestyle If you drink alcohol: Limit how much you have to: 0-1 drink a day for women who are not pregnant. 0-2 drinks a day for men. Know how much alcohol is in your drink. In the [place] one drink equals one 12 oz bottle of beer (355mL), one 5 oz glass of wine (148mL), or one 1 oz glass of hard liquor (44mL). Do not smoke or use any products that have nicotine or tobacco. If you need help quitting, ask your doctor. Avoid secondhand smoke. Do not use drugs. Activity  Try to stay at a healthy   weight. Get at least 30 minutes of exercise on most days, such as: Fast walking. Biking. Swimming.  Medicines Take over-the-counter and prescription medicines only as told by your doctor. Avoid taking birth control pills. Talk to your doctor about the risks of taking birth control pills if: You are over 35 years old. You smoke. You  get very bad headaches. You have had a blood clot. Where to find more information American Stroke Association: www.strokeassociation.org Get help right away if: You or a loved one has any signs of a stroke. "BE FAST" is an easy way to remember the warning signs: B - Balance. Dizziness, sudden trouble walking, or loss of balance. E - Eyes. Trouble seeing or a change in how you see. F - Face. Sudden weakness or loss of feeling of the face. The face or eyelid may droop on one side. A - Arms. Weakness or loss of feeling in an arm. This happens all of a sudden and most often on one side of the body. S - Speech. Sudden trouble speaking, slurred speech, or trouble understanding what people say. T - Time. Time to call emergency services. Write down what time symptoms started. You or a loved one has other signs of a stroke, such as: A sudden, very bad headache with no known cause. Feeling like you may vomit (nausea). Vomiting. A seizure. These symptoms may be an emergency. Get help right away. Call your local emergency services (911 in the [place] Do not wait to see if the symptoms will go away. Do not drive yourself to the hospital. Summary You can help to prevent a stroke by eating healthy, exercising, and not smoking. It also helps to manage any health problems you have. Do not smoke or use any products that contain nicotine or tobacco. Get help right away if you or a loved one has any signs of a stroke. This information is not intended to replace advice given to you by your health care provider. Make sure you discuss any questions you have with your healthcare provider. Document Revised: 07/25/2019 Document Reviewed: 07/25/2019 Elsevier Patient Education  2022 Elsevier Inc.  

## 2021-04-10 NOTE — Assessment & Plan Note (Signed)
Has not been checking blood pressure regularly at home ?Blood pressure in office today is appropriately controlled ?She has been taking both lisinopril and amlodipine, both at 5 mg.  At last visit she was not taking the amlodipine, but more recently her husband did pick up the amlodipine from the pharmacy and so she resumed taking it.  Denies any issues with chest pain or headaches. ?We will continue with current regimen ?Recommend checking blood pressure intermittently at home ?Recommend DASH diet ?

## 2021-04-10 NOTE — Assessment & Plan Note (Signed)
Denies any symptoms concerning for hypothyroidism, clinically appears euthyroid.  We will continue with same dose of levothyroxine and plan to check TSH with upcoming labs ?

## 2021-04-10 NOTE — Assessment & Plan Note (Signed)
Had prior procedure for PFO closure and was recommended to continue with dual antiplatelet therapy for 3 to 6 months.  Procedure was October 24, 2020 and thus is coming up on 6 months ?She has been taking aspirin and Brilinta as recommended.  Given cardiology recommendations of 6 months of therapy and then discontinuing Brilinta with continued use of aspirin, feel that it would be appropriate for patient to discontinue Brilinta later this month at 60-month timeframe.  She indicates that she is due to follow-up with cardiology in the next few months ?

## 2021-04-10 NOTE — Progress Notes (Signed)
? ? ?  Procedures performed today:   ? ?None. ? ?Independent interpretation of notes and tests performed by another provider:  ? ?None. ? ?Brief History, Exam, Impression, and Recommendations:   ? ?BP 132/70   Pulse 60   Temp 97.7 ?F (36.5 ?C)   Ht 5\' 6"  (1.676 m)   Wt 178 lb 3.2 oz (80.8 kg)   LMP 04/07/2019 (Approximate)   SpO2 98%   BMI 28.76 kg/m?  ? ?Hypertension ?Has not been checking blood pressure regularly at home ?Blood pressure in office today is appropriately controlled ?She has been taking both lisinopril and amlodipine, both at 5 mg.  At last visit she was not taking the amlodipine, but more recently her husband did pick up the amlodipine from the pharmacy and so she resumed taking it.  Denies any issues with chest pain or headaches. ?We will continue with current regimen ?Recommend checking blood pressure intermittently at home ?Recommend DASH diet ? ?PFO (patent foramen ovale) ?Had prior procedure for PFO closure and was recommended to continue with dual antiplatelet therapy for 3 to 6 months.  Procedure was October 24, 2020 and thus is coming up on 6 months ?She has been taking aspirin and Brilinta as recommended.  Given cardiology recommendations of 6 months of therapy and then discontinuing Brilinta with continued use of aspirin, feel that it would be appropriate for patient to discontinue Brilinta later this month at 36-month timeframe.  She indicates that she is due to follow-up with cardiology in the next few months ? ?Sinus congestion ?Reports that she has had chronic intermittent sinus congestion, primarily affecting the left sinuses with some pressure noted.  She also indicates having what she feels is some hearing loss in left ear. ?We will place referral to ENT for further evaluation and recommendations ? ?Hypothyroidism ?Denies any symptoms concerning for hypothyroidism, clinically appears euthyroid.  We will continue with same dose of levothyroxine and plan to check TSH with  upcoming labs ? ?Decreased hearing of left ear ?Has been noting ongoing issues as well as associated sinus congestion ?We will refer to ENT for further evaluation and recommendations ? ?Plan for follow-up in about 3 months for CPE, nurse visit for labs 1 week prior ? ? ?___________________________________________ ?Vicki Gonzales de 8-month, MD, ABFM, CAQSM ?Primary Care and Sports Medicine ?[facility] ?

## 2021-04-27 ENCOUNTER — Other Ambulatory Visit (HOSPITAL_BASED_OUTPATIENT_CLINIC_OR_DEPARTMENT_OTHER): Payer: Self-pay | Admitting: Family Medicine

## 2021-04-28 ENCOUNTER — Other Ambulatory Visit (HOSPITAL_BASED_OUTPATIENT_CLINIC_OR_DEPARTMENT_OTHER): Payer: Self-pay | Admitting: Family Medicine

## 2021-05-08 ENCOUNTER — Other Ambulatory Visit (HOSPITAL_BASED_OUTPATIENT_CLINIC_OR_DEPARTMENT_OTHER): Payer: Self-pay | Admitting: Family Medicine

## 2021-06-09 ENCOUNTER — Other Ambulatory Visit: Payer: Self-pay | Admitting: Neurology

## 2021-06-22 ENCOUNTER — Other Ambulatory Visit (HOSPITAL_BASED_OUTPATIENT_CLINIC_OR_DEPARTMENT_OTHER): Payer: Self-pay | Admitting: Family Medicine

## 2021-07-03 ENCOUNTER — Ambulatory Visit (HOSPITAL_BASED_OUTPATIENT_CLINIC_OR_DEPARTMENT_OTHER): Payer: BC Managed Care – PPO

## 2021-07-03 DIAGNOSIS — Z0000 Encounter for general adult medical examination without abnormal findings: Secondary | ICD-10-CM

## 2021-07-04 ENCOUNTER — Other Ambulatory Visit (HOSPITAL_BASED_OUTPATIENT_CLINIC_OR_DEPARTMENT_OTHER): Payer: Self-pay | Admitting: Family Medicine

## 2021-07-04 DIAGNOSIS — E039 Hypothyroidism, unspecified: Secondary | ICD-10-CM

## 2021-07-04 LAB — HEMOGLOBIN A1C
Est. average glucose Bld gHb Est-mCnc: 128 mg/dL
Hgb A1c MFr Bld: 6.1 % — ABNORMAL HIGH (ref 4.8–5.6)

## 2021-07-04 LAB — LIPID PANEL
Chol/HDL Ratio: 2.8 ratio (ref 0.0–4.4)
Cholesterol, Total: 137 mg/dL (ref 100–199)
HDL: 49 mg/dL (ref >39)
LDL Chol Calc (NIH): 59 mg/dL (ref 0–99)
Triglycerides: 173 mg/dL — ABNORMAL HIGH (ref 0–149)
VLDL Cholesterol Cal: 29 mg/dL (ref 5–40)

## 2021-07-04 LAB — CBC WITH DIFFERENTIAL/PLATELET
Basophils Absolute: 0.1 10*3/uL (ref 0.0–0.2)
Basos: 1 %
EOS (ABSOLUTE): 0.2 10*3/uL (ref 0.0–0.4)
Eos: 2 %
Hematocrit: 40 % (ref 34.0–46.6)
Hemoglobin: 13.2 g/dL (ref 11.1–15.9)
Immature Grans (Abs): 0 10*3/uL (ref 0.0–0.1)
Immature Granulocytes: 0 %
Lymphocytes Absolute: 1.8 10*3/uL (ref 0.7–3.1)
Lymphs: 27 %
MCH: 28.9 pg (ref 26.6–33.0)
MCHC: 33 g/dL (ref 31.5–35.7)
MCV: 88 fL (ref 79–97)
Monocytes Absolute: 0.6 10*3/uL (ref 0.1–0.9)
Monocytes: 8 %
Neutrophils Absolute: 4.2 10*3/uL (ref 1.4–7.0)
Neutrophils: 62 %
Platelets: 286 10*3/uL (ref 150–450)
RBC: 4.56 x10E6/uL (ref 3.77–5.28)
RDW: 13.4 % (ref 11.7–15.4)
WBC: 6.8 10*3/uL (ref 3.4–10.8)

## 2021-07-04 LAB — COMPREHENSIVE METABOLIC PANEL
ALT: 26 IU/L (ref 0–32)
AST: 25 IU/L (ref 0–40)
Albumin/Globulin Ratio: 1.8 (ref 1.2–2.2)
Albumin: 4.6 g/dL (ref 3.8–4.9)
Alkaline Phosphatase: 89 IU/L (ref 44–121)
BUN/Creatinine Ratio: 19 (ref 9–23)
BUN: 15 mg/dL (ref 6–24)
Bilirubin Total: 1.1 mg/dL (ref 0.0–1.2)
CO2: 20 mmol/L (ref 20–29)
Calcium: 9.6 mg/dL (ref 8.7–10.2)
Chloride: 102 mmol/L (ref 96–106)
Creatinine, Ser: 0.78 mg/dL (ref 0.57–1.00)
Globulin, Total: 2.6 g/dL (ref 1.5–4.5)
Glucose: 108 mg/dL — ABNORMAL HIGH (ref 70–99)
Potassium: 4.2 mmol/L (ref 3.5–5.2)
Sodium: 139 mmol/L (ref 134–144)
Total Protein: 7.2 g/dL (ref 6.0–8.5)
eGFR: 89 mL/min/{1.73_m2} (ref >59)

## 2021-07-04 LAB — TSH: TSH: 8.26 u[IU]/mL — ABNORMAL HIGH (ref 0.450–4.500)

## 2021-07-04 MED ORDER — LEVOTHYROXINE SODIUM 75 MCG PO TABS: 75.0000 ug | ORAL_TABLET | Freq: Every day | ORAL | 1 refills | Status: DC

## 2021-07-10 ENCOUNTER — Encounter (HOSPITAL_BASED_OUTPATIENT_CLINIC_OR_DEPARTMENT_OTHER): Payer: Self-pay | Admitting: Family Medicine

## 2021-07-10 ENCOUNTER — Ambulatory Visit (INDEPENDENT_AMBULATORY_CARE_PROVIDER_SITE_OTHER): Payer: BC Managed Care – PPO | Admitting: Family Medicine

## 2021-07-10 VITALS — BP 157/79 | HR 62 | Temp 97.6°F | Ht 66.0 in | Wt 191.8 lb

## 2021-07-10 DIAGNOSIS — Z13220 Encounter for screening for lipoid disorders: Secondary | ICD-10-CM | POA: Insufficient documentation

## 2021-07-10 DIAGNOSIS — Z0000 Encounter for general adult medical examination without abnormal findings: Secondary | ICD-10-CM | POA: Diagnosis not present

## 2021-07-10 DIAGNOSIS — Z23 Encounter for immunization: Secondary | ICD-10-CM | POA: Diagnosis not present

## 2021-07-10 DIAGNOSIS — E039 Hypothyroidism, unspecified: Secondary | ICD-10-CM

## 2021-07-10 DIAGNOSIS — R7303 Prediabetes: Secondary | ICD-10-CM | POA: Insufficient documentation

## 2021-07-10 MED ORDER — SHINGRIX 50 MCG/0.5ML IM SUSR: 0.5000 mL | Freq: Once | INTRAMUSCULAR | 0 refills | Status: AC

## 2021-07-10 NOTE — Progress Notes (Signed)
Subjective:    CC: Annual Physical Exam  HPI:  Vicki Gonzales is a 56 y.o. presenting for annual physical  I reviewed the past medical history, family history, social history, surgical history, and allergies today and no changes were needed.  Please see the problem list section below in epic for further details.  Past Medical History: Past Medical History:  Diagnosis Date   ADHD    Anxiety    Carpal tunnel syndrome on right    Depression    HTN (hypertension)    Hypothyroidism    Migraines    Spondylosis    Stroke [facility])    Past Surgical History: Past Surgical History:  Procedure Laterality Date   ANTERIOR CRUCIATE LIGAMENT REPAIR Left 2021   APPENDECTOMY     BUBBLE STUDY  08/17/2020   Procedure: BUBBLE STUDY;  Surgeon: Jodelle Red, MD;  Location: [facility] ENDOSCOPY;  Service: Cardiovascular;;   FALSE ANEURYSM REPAIR Right 08/15/2020   Procedure: REPAIR RIGHT FEMORAL PSEUDOANEURYSM;  Surgeon: Nada Libman, MD;  Location: [facility] OR;  Service: Vascular;  Laterality: Right;   IR ANGIO INTRA EXTRACRAN SEL COM CAROTID INNOMINATE UNI L MOD SED  08/14/2020   IR ANGIO INTRA EXTRACRAN SEL INTERNAL CAROTID UNI R MOD SED  08/14/2020   IR ANGIO VERTEBRAL SEL SUBCLAVIAN INNOMINATE BILAT MOD SED  08/14/2020   IR CT HEAD LTD  08/14/2020   IR CT HEAD LTD  08/14/2020   IR INTRA CRAN STENT  08/14/2020   IR INTRAVSC STENT CERV CAROTID W/O EMB-PROT MOD SED INC ANGIO  08/14/2020   IR RADIOLOGIST EVAL & MGMT  09/24/2020   PATENT FORAMEN OVALE(PFO) CLOSURE N/A 10/24/2020   Procedure: PATENT FORAMEN OVALE (PFO) CLOSURE;  Surgeon: Tonny Bollman, MD;  [facility]: [facility] INVASIVE CV LAB;  Service: Cardiovascular;  Laterality: N/A;   RADIOLOGY WITH ANESTHESIA N/A 08/14/2020   Procedure: IR WITH ANESTHESIA;  Surgeon: Julieanne Cotton, MD;  Location: [facility] OR;  Service: Radiology;  Laterality: N/A;   TEE WITHOUT CARDIOVERSION N/A 08/17/2020   Procedure: TRANSESOPHAGEAL ECHOCARDIOGRAM (TEE);  Surgeon:  Jodelle Red, MD;  Location: [facility] ENDOSCOPY;  Service: Cardiovascular;  Laterality: N/A;   TUBAL LIGATION     Social History: Social History   Socioeconomic History   Marital status: Married    Spouse name: Not on file   Number of children: 5   Years of education: 12   Highest education level: High school graduate  Occupational History   Occupation: Retired  Tobacco Use   Smoking status: Former   Smokeless tobacco: Never   Tobacco comments:    Quit smoking in her 20's.  Substance and Sexual Activity   Alcohol use: Not Currently   Drug use: Never   Sexual activity: Not on file  Other Topics Concern   Not on file  Social History Narrative   Lives with family.   Right-handed.   Caffeine use: one cup coffee each morning.   Social Determinants of Health   Financial Resource Strain: Not on file  Food Insecurity: Not on file  Transportation Needs: Not on file  Physical Activity: Not on file  Stress: Not on file  Social Connections: Not on file   Family History: Family History  Problem Relation Age of Onset   Dementia Mother    Congestive Heart Failure Father    Allergies: Allergies  Allergen Reactions   Keflex [Cephalexin] Itching   Medications: See med rec.  Review of Systems: No headache, visual changes, nausea, vomiting, diarrhea, constipation,  dizziness, abdominal pain, skin rash, fevers, chills, night sweats, swollen lymph nodes, weight loss, chest pain, body aches, joint swelling, muscle aches, shortness of breath, mood changes, visual or auditory hallucinations.  Objective:    BP (!) 157/79   Pulse 62   Temp 97.6 F (36.4 C) (Oral)   Ht 5\' 6"  (1.676 m)   Wt 191 lb 12.8 oz (87 kg)   LMP 04/07/2019 (Approximate)   SpO2 99%   BMI 30.96 kg/m   General: Well Developed, well nourished, and in no acute distress. Neuro: Alert and oriented x3, extra-ocular muscles intact, sensation grossly intact. Cranial nerves II through XII are intact, motor,  sensory, and coordinative functions are all intact. HEENT: Normocephalic, atraumatic, pupils equal round reactive to light, neck supple, no masses, no lymphadenopathy, thyroid nonpalpable. Oropharynx, nasopharynx, external ear canals are unremarkable. Skin: Warm and dry, no rashes noted. Cardiac: Regular rate and rhythm, no murmurs rubs or gallops. Respiratory: Clear to auscultation bilaterally. Not using accessory muscles, speaking in full sentences. Abdominal: Soft, nontender, nondistended, positive bowel sounds, no masses, no organomegaly. Musculoskeletal: Shoulder, elbow, wrist, hip, knee, ankle stable, and with full range of motion.  Impression and Recommendations:    Wellness examination Routine HCM labs reviewed. HCM reviewed/discussed. Anticipatory guidance regarding healthy weight, lifestyle and choices given. Recommend healthy diet.  Recommend approximately 150 minutes/week of moderate intensity exercise Recommend regular dental and vision exams Always use seatbelt/lap and shoulder restraints Recommend using smoke alarms and checking batteries at least twice a year Recommend using sunscreen when outside Discussed colon cancer screening recommendations, options.  Patient reports having colonoscopy previously, thinks about 5 years ago with recommendation for 10 year follow-up Discussed recommendations for shingles vaccine.  Patient amenable, Rx sent Discussed tetanus immunization recommendations, patient agreed to proceed with this today  She also indicates some concerns regarding her weight and has questions about interventions related to this.  We did discuss lifestyle modifications, she plans to be gradually increasing her activity level.  Did discuss pharmacotherapy including oral as well as injectable options.  We also discussed that her TSH was elevated indicating dose of levothyroxine needed to be increased which could also be impacting weight and making it difficult to lose  weight.  She has increased the dose of her levothyroxine and we will be rechecking her TSH in several weeks to determine appropriateness of current levothyroxine dose.  Return in about 3 months (around 10/10/2021).   ___________________________________________ Wilburt Messina de 12/10/2021, MD, ABFM, CAQSM Primary Care and Sports Medicine [facility]

## 2021-07-10 NOTE — Assessment & Plan Note (Addendum)
Routine HCM labs reviewed. HCM reviewed/discussed. Anticipatory guidance regarding healthy weight, lifestyle and choices given. Recommend healthy diet.  Recommend approximately 150 minutes/week of moderate intensity exercise Recommend regular dental and vision exams Always use seatbelt/lap and shoulder restraints Recommend using smoke alarms and checking batteries at least twice a year Recommend using sunscreen when outside Discussed colon cancer screening recommendations, options.  Patient reports having colonoscopy previously, thinks about 5 years ago with recommendation for 10 year follow-up Discussed recommendations for shingles vaccine.  Patient amenable, Rx sent Discussed tetanus immunization recommendations, patient agreed to proceed with this today

## 2021-07-16 ENCOUNTER — Other Ambulatory Visit: Payer: Self-pay | Admitting: Family Medicine

## 2021-07-16 DIAGNOSIS — Z1231 Encounter for screening mammogram for malignant neoplasm of breast: Secondary | ICD-10-CM

## 2021-07-18 ENCOUNTER — Other Ambulatory Visit: Payer: Self-pay

## 2021-07-18 MED ORDER — AMOXICILLIN 500 MG PO TABS: 2000.0000 mg | ORAL_TABLET | ORAL | 2 refills | Status: DC | PRN

## 2021-07-25 ENCOUNTER — Other Ambulatory Visit (HOSPITAL_BASED_OUTPATIENT_CLINIC_OR_DEPARTMENT_OTHER): Payer: Self-pay | Admitting: Family Medicine

## 2021-07-27 ENCOUNTER — Other Ambulatory Visit (HOSPITAL_BASED_OUTPATIENT_CLINIC_OR_DEPARTMENT_OTHER): Payer: Self-pay | Admitting: Family Medicine

## 2021-07-27 DIAGNOSIS — E039 Hypothyroidism, unspecified: Secondary | ICD-10-CM

## 2021-07-31 ENCOUNTER — Other Ambulatory Visit (HOSPITAL_BASED_OUTPATIENT_CLINIC_OR_DEPARTMENT_OTHER): Payer: Self-pay | Admitting: Family Medicine

## 2021-07-31 MED ORDER — ESCITALOPRAM OXALATE 10 MG PO TABS: 10.0000 mg | ORAL_TABLET | Freq: Every day | ORAL | 3 refills | Status: DC

## 2021-08-06 ENCOUNTER — Ambulatory Visit
Admission: RE | Admit: 2021-08-06 | Discharge: 2021-08-06 | Disposition: A | Discharge status: Home / Self Care | Payer: BC Managed Care – PPO | Source: Ambulatory Visit | Attending: Family Medicine | Admitting: Family Medicine

## 2021-08-06 DIAGNOSIS — Z1231 Encounter for screening mammogram for malignant neoplasm of breast: Secondary | ICD-10-CM

## 2021-08-21 ENCOUNTER — Ambulatory Visit (HOSPITAL_BASED_OUTPATIENT_CLINIC_OR_DEPARTMENT_OTHER): Payer: BC Managed Care – PPO

## 2021-08-21 ENCOUNTER — Other Ambulatory Visit (HOSPITAL_BASED_OUTPATIENT_CLINIC_OR_DEPARTMENT_OTHER): Payer: Self-pay | Admitting: Family Medicine

## 2021-08-21 DIAGNOSIS — E039 Hypothyroidism, unspecified: Secondary | ICD-10-CM

## 2021-08-22 ENCOUNTER — Ambulatory Visit (HOSPITAL_BASED_OUTPATIENT_CLINIC_OR_DEPARTMENT_OTHER): Payer: BC Managed Care – PPO

## 2021-08-22 DIAGNOSIS — E039 Hypothyroidism, unspecified: Secondary | ICD-10-CM

## 2021-08-23 LAB — TSH: TSH: 3.88 u[IU]/mL (ref 0.450–4.500)

## 2021-09-02 ENCOUNTER — Other Ambulatory Visit: Payer: Self-pay | Admitting: Cardiovascular Disease

## 2021-09-17 ENCOUNTER — Other Ambulatory Visit (HOSPITAL_BASED_OUTPATIENT_CLINIC_OR_DEPARTMENT_OTHER): Payer: Self-pay | Admitting: Family Medicine

## 2021-09-17 DIAGNOSIS — E039 Hypothyroidism, unspecified: Secondary | ICD-10-CM

## 2021-10-04 ENCOUNTER — Other Ambulatory Visit (HOSPITAL_BASED_OUTPATIENT_CLINIC_OR_DEPARTMENT_OTHER): Payer: Self-pay

## 2021-10-04 DIAGNOSIS — I10 Essential (primary) hypertension: Secondary | ICD-10-CM

## 2021-10-04 MED ORDER — ROSUVASTATIN CALCIUM 40 MG PO TABS: 40.0000 mg | ORAL_TABLET | Freq: Every day | ORAL | 5 refills | Status: DC

## 2021-10-11 ENCOUNTER — Telehealth: Payer: Self-pay

## 2021-10-11 DIAGNOSIS — Q2112 Patent foramen ovale: Secondary | ICD-10-CM

## 2021-10-11 NOTE — Telephone Encounter (Signed)
The patient had PFO closure 10/24/2020. Scheduled her for echo and office visit on 10/28/2021. She was grateful for call and agrees with plan.

## 2021-10-25 NOTE — Progress Notes (Unsigned)
[facility]   MULTIDISCIPLINARY HEART VALVE CLINIC                                     Cardiology Office Note:    Date:  10/29/2021   ID:  Vicki Gonzales, DOB [birth year], MRN [id number]  PCP:  [name], [name], MD  [facility] Cardiologist:  None  [facility] Electrophysiologist:  Lanier Prude, MD   Referring MD: [name], [name], MD   Chief Complaint  Patient presents with   Follow-up    1 year s/p PFO closure    History of Present Illness:    Vicki Gonzales is a 56 y.o. female with a hx of hypertension, migraine headaches, hypothyroidism, depression, recent R ICA occlusive CVA, OSA on CPAP, and PFO s/p PFO closure per Dr. Excell Seltzer 10/24/20.    In 08/2020 the patient developed sudden onset of left facial droop and slurred speech. She presented to the ED at which time a code stroke was called. Emergent CTA showed proximal occlusion of the right internal carotid artery with intracranial reconstitution. There was also suspicion of subocclusive embolus in the right MCA bifurcation and occlusion of the left vertebral artery at its origin with distal reconstitution. She was treated with tPA and she underwent emergent revascularization by interventional neuroradiology. She subsequently was treated with ASA and Plavix. TEE was ultimately completed and demonstrated a PFO. She was then referred to Dr. Excell Seltzer for PFO closure.    TEE imaging showed normal LV/RV function, mild-moderate TR, no other significant valvular disease, and a moderate to large PFO based on the presence of color flow through the defect indicated bidirectional shunt and a strongly positive bubble study indicating large PFO.   She ultimately underwent PFO closure on 10/24/20 with an 18 mm Amplatzer PFO occluder deployed under intracardiac echo and fluoroscopic guidance. Recommendations were to  continue ASA x1 month and Brilinta x6 months. After 6 months of Brilinta, patient should  resume ASA 81 mg daily indefinitely. She will also need SBE prophylaxis x6 months. Post procedure echocardiogram showed a well-seated Amplatzer septal occluder with no visible shunt by color doppler.  She was seen in follow up with myself and was doing well from a procedure standpoint. She presents today alone and states that she continues to do great. She has no chest pain, palpitations, LE edema, SOB, dizziness, neuro changes, or syncope. She continues to tolerate ASA 81mg . Echo with bubble today shows no shunting.    Past Medical History:  Diagnosis Date   ADHD    Anxiety    Carpal tunnel syndrome on right    Depression    HTN (hypertension)    Hypothyroidism    Migraines    Spondylosis    Stroke [facility])     Past Surgical History:  Procedure Laterality Date   ANTERIOR CRUCIATE LIGAMENT REPAIR Left 2021   APPENDECTOMY     BUBBLE STUDY  08/17/2020   Procedure: BUBBLE STUDY;  Surgeon: 10/17/2020, MD;  Location: [facility] ENDOSCOPY;  Service: Cardiovascular;;   FALSE ANEURYSM REPAIR Right 08/15/2020   Procedure: REPAIR RIGHT FEMORAL PSEUDOANEURYSM;  Surgeon: 10/15/2020, MD;  Location: [facility] OR;  Service: Vascular;  Laterality: Right;   IR ANGIO INTRA EXTRACRAN SEL COM CAROTID INNOMINATE UNI L MOD SED  08/14/2020   IR ANGIO INTRA EXTRACRAN SEL INTERNAL CAROTID UNI R MOD SED  08/14/2020   IR ANGIO VERTEBRAL SEL SUBCLAVIAN INNOMINATE BILAT MOD SED  08/14/2020   IR CT HEAD LTD  08/14/2020   IR CT HEAD LTD  08/14/2020   IR INTRA CRAN STENT  08/14/2020   IR INTRAVSC STENT CERV CAROTID W/O EMB-PROT MOD SED INC ANGIO  08/14/2020   IR RADIOLOGIST EVAL & MGMT  09/24/2020   PATENT FORAMEN OVALE(PFO) CLOSURE N/A 10/24/2020   Procedure: PATENT FORAMEN OVALE (PFO) CLOSURE;  Surgeon: Tonny Bollman, MD;  Location: [facility] INVASIVE CV LAB;  Service: Cardiovascular;  Laterality: N/A;   RADIOLOGY WITH ANESTHESIA N/A 08/14/2020   Procedure: IR WITH ANESTHESIA;  Surgeon: Julieanne Cotton, MD;  Location: [facility] OR;   Service: Radiology;  Laterality: N/A;   TEE WITHOUT CARDIOVERSION N/A 08/17/2020   Procedure: TRANSESOPHAGEAL ECHOCARDIOGRAM (TEE);  Surgeon: Jodelle Red, MD;  Location: [facility] ENDOSCOPY;  Service: Cardiovascular;  Laterality: N/A;   TUBAL LIGATION      Current Medications: Current Meds  Medication Sig   acetaminophen (TYLENOL) 325 MG tablet Take 325 mg by mouth every 6 (six) hours as needed for moderate pain.   amLODipine (NORVASC) 5 MG tablet Take 1 tablet (5 mg total) by mouth daily.   aspirin 81 MG chewable tablet Chew 1 tablet (81 mg total) by mouth daily.   escitalopram (LEXAPRO) 10 MG tablet TAKE 1 TABLET BY MOUTH EVERY DAY   escitalopram (LEXAPRO) 10 MG tablet Take 1 tablet (10 mg total) by mouth daily.   levothyroxine (SYNTHROID) 75 MCG tablet TAKE 1 TABLET BY MOUTH EVERY DAY   lisinopril (ZESTRIL) 5 MG tablet TAKE 1 TABLET (5 MG TOTAL) BY MOUTH DAILY.   rosuvastatin (CRESTOR) 40 MG tablet Take 1 tablet (40 mg total) by mouth daily.   [DISCONTINUED] amoxicillin (AMOXIL) 500 MG tablet Take 4 tablets (2,000 mg total) by mouth as needed (DENTAL PROCEDURES).     Allergies:   Keflex [cephalexin]   Social History   Socioeconomic History   Marital status: Married    Spouse name: Not on file   Number of children: 5   Years of education: 12   Highest education level: High school graduate  Occupational History   Occupation: Retired  Tobacco Use   Smoking status: Former   Smokeless tobacco: Never   Tobacco comments:    Quit smoking in her 20's.  Substance and Sexual Activity   Alcohol use: Not Currently   Drug use: Never   Sexual activity: Not on file  Other Topics Concern   Not on file  Social History Narrative   Lives with family.   Right-handed.   Caffeine use: one cup coffee each morning.   Social Determinants of Health   Financial Resource Strain: Not on file  Food Insecurity: Not on file  Transportation Needs: Not on file  Physical Activity: Not on file   Stress: Not on file  Social Connections: Not on file     Family History: The patient's family history includes Breast cancer in her paternal aunt; Congestive Heart Failure in her father; Dementia in her mother.  ROS:   Please see the history of present illness.    All other systems reviewed and are negative.  EKGs/Labs/Other Studies Reviewed:    The following studies were reviewed today:  Echocardiogram with bubble 10/28/21:    1. Left ventricular ejection fraction, by estimation, is 60 to 65%. The  left ventricle has normal function. The left ventricle has no regional  wall motion abnormalities. Left ventricular diastolic parameters were  normal.  2. Right ventricular systolic function is normal. The right ventricular  size is mildly enlarged.   3. The mitral valve is normal in structure. Trivial mitral valve  regurgitation. No evidence of mitral stenosis.   4. The aortic valve was not well visualized. Aortic valve regurgitation  is not visualized.   5. S/p Amplatzer septal occluder. Agitated saline contrast bubble study  was negative, with no evidence of any interatrial shunt.   Echocardiogram 10/24/20:   1. Left ventricular ejection fraction, by estimation, is 60 to 65%. The  left ventricle has normal function. The left ventricle has no regional  wall motion abnormalities. Left ventricular diastolic function could not  be evaluated.   2. Right ventricular systolic function is normal. The right ventricular  size is normal.   3. Amplatzer septal occluder well-seated on the atrial septum without  visible shunt by color Doppler.   4. The mitral valve is normal in structure.   5. The aortic valve is tricuspid. There is mild thickening of the aortic  valve. Mild aortic valve sclerosis is present, with no evidence of aortic  valve stenosis.   6. The inferior vena cava is normal in size with greater than 50%  respiratory variability, suggesting right atrial pressure of 3  mmHg.    TEE:   1. Left ventricular ejection fraction, by estimation, is 60 to 65%. The  left ventricle has normal function.   2. Right ventricular systolic function is normal. The right ventricular  size is normal.   3. No left atrial/left atrial appendage thrombus was detected.   4. The mitral valve is normal in structure. Trivial mitral valve  regurgitation. No evidence of mitral stenosis.   5. Tricuspid valve regurgitation is mild to moderate.   6. The aortic valve is tricuspid. Aortic valve regurgitation is not  visualized. Mild aortic valve sclerosis is present, with no evidence of  aortic valve stenosis.   7. There is mild (Grade II) plaque involving the descending aorta.   8. Evidence of atrial level shunting detected by color flow Doppler.  Agitated saline contrast bubble study was negative, with no evidence of  any interatrial shunt. There is a small patent foramen ovale with  bidirectional shunting across atrial septum.   Conclusion(s)/Recommendation(s): PFO present, with both right to left and  left to right shunting. Communicated with Dr. Erlinda Hong, neurology.    EKG:  EKG is not ordered today.    Recent Labs: 07/03/2021: ALT 26; BUN 15; Creatinine, Ser 0.78; Hemoglobin 13.2; Platelets 286; Potassium 4.2; Sodium 139 08/22/2021: TSH 3.880   Recent Lipid Panel    Component Value Date/Time   CHOL 137 07/03/2021 0817   TRIG 173 (H) 07/03/2021 0817   HDL 49 07/03/2021 0817   CHOLHDL 2.8 07/03/2021 0817   CHOLHDL 6.7 08/12/2020 1628   VLDL 43 (H) 08/12/2020 1628   LDLCALC 59 07/03/2021 0817   Physical Exam:    VS:  BP 120/80 (BP Location: Left Arm, Patient Position: Sitting, Cuff Size: Normal)   Pulse 64   Ht 5\' 6"  (1.676 m)   Wt 199 lb (90.3 kg)   LMP 04/07/2019 (Approximate)   SpO2 96%   BMI 32.12 kg/m     Wt Readings from Last 3 Encounters:  10/28/21 199 lb (90.3 kg)  07/10/21 191 lb 12.8 oz (87 kg)  04/10/21 178 lb 3.2 oz (80.8 kg)    General: Well developed,  well nourished, NAD Lungs:Clear to ausculation bilaterally. No wheezes, rales, or rhonchi. Breathing is  unlabored. Cardiovascular: RRR with S1 S2. No murmurs Extremities: No edema. Neuro: Alert and oriented. No focal deficits. No facial asymmetry. MAE spontaneously. Psych: Responds to questions appropriately with normal affect.    ASSESSMENT/PLAN:    PFO closure: Underwent successful PFO closure 10/24/20 with an 18 mm Amplatzer PFO occluder deployed under intracardiac echo and fluoroscopic guidance. Continue ASA 81 mg daily indefinitely. Echocardiogram with bubble today shows normal LV function at 60-65%, stable Amplatzer septal occluder with negative bubble study with no evidence of interatrial shunting. PRN follow up.   CVA: Follows with Dr. Pearlean Brownie. Continue with excellent HLD, HTN control. No new neuro changes. Asking to reduce Crestor to 20mg . Has upcoming lipid panel with PCP   HLD: Last LDL, 79. Intolerant to Liptior therefore transitioned to Crestor with better tolerance.     HTN: Stable with no changes needed today.   Medication Adjustments/Labs and Tests Ordered: Current medicines are reviewed at length with the patient today.  Concerns regarding medicines are outlined above.  Orders Placed This Encounter  Procedures   EKG 12-Lead   No orders of the defined types were placed in this encounter.   Patient Instructions  Medication Instructions:  Your physician recommends that you continue on your current medications as directed. Please refer to the Current Medication list given to you today.  *If you need a refill on your cardiac medications before your next appointment, please call your pharmacy*  Lab Work: NONE  Testing/Procedures: NONE  Follow-Up: At [facility], you and your health needs are our priority.  As part of our continuing mission to provide you with exceptional heart care, we have created designated Provider Care Teams.  These Care Teams include  your primary Cardiologist (physician) and Advanced Practice Providers (APPs -  Physician Assistants and Nurse Practitioners) who all work together to provide you with the care you need, when you need it.  Your next appointment:   As needed  The format for your next appointment:   In Person  Provider:   [facility], MD or Tonny Bollman, NP  Important Information About Sugar         Signed, Georgie Chard, NP  10/29/2021 8:15 AM    [facility]

## 2021-10-28 ENCOUNTER — Ambulatory Visit: Payer: 59 | Attending: Cardiology

## 2021-10-28 ENCOUNTER — Ambulatory Visit: Payer: 59 | Admitting: Cardiology

## 2021-10-28 VITALS — BP 120/80 | HR 64 | Ht 66.0 in | Wt 199.0 lb

## 2021-10-28 DIAGNOSIS — I10 Essential (primary) hypertension: Secondary | ICD-10-CM

## 2021-10-28 DIAGNOSIS — I6340 Cerebral infarction due to embolism of unspecified cerebral artery: Secondary | ICD-10-CM

## 2021-10-28 DIAGNOSIS — Q2112 Patent foramen ovale: Secondary | ICD-10-CM | POA: Insufficient documentation

## 2021-10-28 LAB — ECHOCARDIOGRAM LIMITED BUBBLE STUDY
Area-P 1/2: 3.76 cm2
S' Lateral: 2.7 cm

## 2021-10-28 NOTE — Patient Instructions (Signed)
Medication Instructions:  Your physician recommends that you continue on your current medications as directed. Please refer to the Current Medication list given to you today.  *If you need a refill on your cardiac medications before your next appointment, please call your pharmacy*  Lab Work: NONE  Testing/Procedures: NONE  Follow-Up: At [facility], you and your health needs are our priority.  As part of our continuing mission to provide you with exceptional heart care, we have created designated Provider Care Teams.  These Care Teams include your primary Cardiologist (physician) and Advanced Practice Providers (APPs -  Physician Assistants and Nurse Practitioners) who all work together to provide you with the care you need, when you need it.  Your next appointment:   As needed  The format for your next appointment:   In Person  Provider:   Sherren Mocha, MD or Kathyrn Drown, NP  Important Information About Sugar

## 2021-10-29 ENCOUNTER — Other Ambulatory Visit (HOSPITAL_BASED_OUTPATIENT_CLINIC_OR_DEPARTMENT_OTHER): Payer: Self-pay | Admitting: Family Medicine

## 2021-10-29 MED ORDER — LISINOPRIL 5 MG PO TABS: 5.0000 mg | ORAL_TABLET | Freq: Every day | ORAL | 0 refills | Status: DC

## 2021-10-30 ENCOUNTER — Other Ambulatory Visit (HOSPITAL_BASED_OUTPATIENT_CLINIC_OR_DEPARTMENT_OTHER): Payer: Self-pay | Admitting: Family Medicine

## 2021-11-11 ENCOUNTER — Ambulatory Visit (HOSPITAL_BASED_OUTPATIENT_CLINIC_OR_DEPARTMENT_OTHER): Payer: BC Managed Care – PPO | Admitting: Family Medicine

## 2021-11-12 ENCOUNTER — Other Ambulatory Visit (HOSPITAL_BASED_OUTPATIENT_CLINIC_OR_DEPARTMENT_OTHER): Payer: Self-pay | Admitting: Family Medicine

## 2021-11-12 ENCOUNTER — Encounter (HOSPITAL_BASED_OUTPATIENT_CLINIC_OR_DEPARTMENT_OTHER): Payer: Self-pay | Admitting: Family Medicine

## 2021-11-12 ENCOUNTER — Ambulatory Visit (INDEPENDENT_AMBULATORY_CARE_PROVIDER_SITE_OTHER): Payer: 59 | Admitting: Family Medicine

## 2021-11-12 VITALS — BP 125/83 | HR 73 | Temp 97.6°F | Ht 66.0 in | Wt 200.4 lb

## 2021-11-12 DIAGNOSIS — E669 Obesity, unspecified: Secondary | ICD-10-CM

## 2021-11-12 DIAGNOSIS — E039 Hypothyroidism, unspecified: Secondary | ICD-10-CM | POA: Diagnosis not present

## 2021-11-12 DIAGNOSIS — R7303 Prediabetes: Secondary | ICD-10-CM | POA: Diagnosis not present

## 2021-11-12 DIAGNOSIS — E66811 Obesity, class 1: Secondary | ICD-10-CM

## 2021-11-12 DIAGNOSIS — I10 Essential (primary) hypertension: Secondary | ICD-10-CM | POA: Diagnosis not present

## 2021-11-12 MED ORDER — ROSUVASTATIN CALCIUM 20 MG PO TABS: 20.0000 mg | ORAL_TABLET | Freq: Every day | ORAL | 1 refills | Status: DC

## 2021-11-12 MED ORDER — LISINOPRIL 5 MG PO TABS: 5.0000 mg | ORAL_TABLET | Freq: Every day | ORAL | 1 refills | Status: DC

## 2021-11-12 NOTE — Assessment & Plan Note (Signed)
Blood pressure is at goal in office today.  She continues with amlodipine and lisinopril at this time.  Denies any current issues with chest pain or headaches.  Does check blood pressure occasionally at home. Given appropriate control of blood pressure, can continue with current regimen, no changes made today.  Refill of lisinopril sent to pharmacy on file.

## 2021-11-12 NOTE — Assessment & Plan Note (Signed)
Patient continues with lifestyle modifications.  She has been additionally working towards weight loss as below.  We had previously discussed possibility of starting medication such as GLP-1 receptor agonist to assist with control of blood sugars and to help with weight loss.  She would be interested in proceeding with this.  We did discuss options today and she would be interested in starting [facility]. We did discuss general considerations including potential side effects, starting with medication at low-dose and gradually titrating in order to limit risk of side effects.  We also discussed potential expense related to medication given that these are not new medications as well as the potential for medication to not be covered well by insurance.  She does voice interest in proceeding with medication after discussion.  We did review that at the current time, medication is not available at starting doses as of yet and we will plan to revisit this in the coming months when starting doses of 0.25 mg and 0.5 mg become more generally available.  She voices understanding and agreement with this.  We will plan for follow-up in about 3 months.  Advised patient to contact the in the new year to request prescription for medication to see if it is available.  We will plan to send that prescription into [facility] health outpatient pharmacy downstairs.Marland Kitchen

## 2021-11-12 NOTE — Assessment & Plan Note (Signed)
As above, patient has been working on lifestyle modifications, however continues to have some weight gain.  She is interested in pharmacotherapy options.  These were discussed as outlined above.  We will look to proceed with [facility] once this medication becomes available.  Advised that she contact the office to look into obtaining prescription early in the new year.  We will plan to send that prescription to our pharmacy downstairs here

## 2021-11-12 NOTE — Patient Instructions (Signed)
  Medication Instructions:  Your physician recommends that you continue on your current medications as directed. Please refer to the Current Medication list given to you today. --If you need a refill on any your medications before your next appointment, please call your pharmacy first. If no refills are authorized on file call the office.-- Lab Work: Your physician has recommended that you have lab work today: No If you have labs (blood work) drawn today and your tests are completely normal, you will receive your results via MyChart message OR a phone call from our staff.  Please ensure you check your voicemail in the event that you authorized detailed messages to be left on a delegated number. If you have any lab test that is abnormal or we need to change your treatment, we will call you to review the results.  Referrals/Procedures/Imaging: No  Follow-Up: Your next appointment:   Your physician recommends that you schedule a follow-up appointment in: 3 month with Dr. de Cuba.  You will receive a text message or e-mail with a link to a survey about your care and experience with us today! We would greatly appreciate your feedback!   Thanks for letting us be apart of your health journey!!  Primary Care and Sports Medicine   Dr. Raymond de Cuba   We encourage you to activate your patient portal called "MyChart".  Sign up information is provided on this After Visit Summary.  MyChart is used to connect with patients for Virtual Visits (Telemedicine).  Patients are able to view lab/test results, encounter notes, upcoming appointments, etc.  Non-urgent messages can be sent to your provider as well. To learn more about what you can do with MyChart, please visit --  [email or web address].    

## 2021-11-12 NOTE — Progress Notes (Signed)
    Procedures performed today:    None.  Independent interpretation of notes and tests performed by another provider:   None.  Brief History, Exam, Impression, and Recommendations:    BP 125/83   Pulse 73   Temp 97.6 F (36.4 C) (Oral)   Ht 5\' 6"  (1.676 m)   Wt 200 lb 6.4 oz (90.9 kg)   LMP 04/07/2019 (Approximate)   SpO2 99%   BMI 32.35 kg/m   Hypertension Blood pressure is at goal in office today.  She continues with amlodipine and lisinopril at this time.  Denies any current issues with chest pain or headaches.  Does check blood pressure occasionally at home. Given appropriate control of blood pressure, can continue with current regimen, no changes made today.  Refill of lisinopril sent to pharmacy on file.  Hypothyroidism Patient continues with levothyroxine 75 mcg daily.  Most recent TSH in August was at goal which was checked after most recent dose change.  Clinically patient is euthyroid.  At this time, we will continue with levothyroxine at same dose, no changes made today  Prediabetes Patient continues with lifestyle modifications.  She has been additionally working towards weight loss as below.  We had previously discussed possibility of starting medication such as GLP-1 receptor agonist to assist with control of blood sugars and to help with weight loss.  She would be interested in proceeding with this.  We did discuss options today and she would be interested in starting [facility]. We did discuss general considerations including potential side effects, starting with medication at low-dose and gradually titrating in order to limit risk of side effects.  We also discussed potential expense related to medication given that these are not new medications as well as the potential for medication to not be covered well by insurance.  She does voice interest in proceeding with medication after discussion.  We did review that at the current time, medication is not available at  starting doses as of yet and we will plan to revisit this in the coming months when starting doses of 0.25 mg and 0.5 mg become more generally available.  She voices understanding and agreement with this.  We will plan for follow-up in about 3 months.  Advised patient to contact the in the new year to request prescription for medication to see if it is available.  We will plan to send that prescription into [facility] outpatient pharmacy downstairs..  Obesity (BMI 30.0-34.9) As above, patient has been working on lifestyle modifications, however continues to have some weight gain.  She is interested in pharmacotherapy options.  These were discussed as outlined above.  We will look to proceed with [facility] once this medication becomes available.  Advised that she contact the office to look into obtaining prescription early in the new year.  We will plan to send that prescription to our pharmacy downstairs here  Return in about 3 months (around 02/12/2022).   ___________________________________________ Rober Skeels [name], MD, ABFM, CAQSM [facility] [place]

## 2021-11-12 NOTE — Assessment & Plan Note (Signed)
Patient continues with levothyroxine 75 mcg daily.  Most recent TSH in August was at goal which was checked after most recent dose change.  Clinically patient is euthyroid.  At this time, we will continue with levothyroxine at same dose, no changes made today

## 2021-11-26 ENCOUNTER — Other Ambulatory Visit (HOSPITAL_COMMUNITY): Payer: BC Managed Care – PPO

## 2021-11-29 ENCOUNTER — Other Ambulatory Visit: Payer: Self-pay | Admitting: Family Medicine

## 2021-11-30 ENCOUNTER — Other Ambulatory Visit: Payer: Self-pay | Admitting: Cardiovascular Disease

## 2021-12-02 ENCOUNTER — Other Ambulatory Visit: Payer: Self-pay | Admitting: Cardiovascular Disease

## 2021-12-02 NOTE — Telephone Encounter (Signed)
Already reordered as of today

## 2021-12-05 ENCOUNTER — Other Ambulatory Visit (HOSPITAL_BASED_OUTPATIENT_CLINIC_OR_DEPARTMENT_OTHER): Payer: Self-pay | Admitting: Family Medicine

## 2021-12-05 DIAGNOSIS — E039 Hypothyroidism, unspecified: Secondary | ICD-10-CM

## 2021-12-18 ENCOUNTER — Other Ambulatory Visit (HOSPITAL_BASED_OUTPATIENT_CLINIC_OR_DEPARTMENT_OTHER): Payer: Self-pay | Admitting: Family Medicine

## 2021-12-18 ENCOUNTER — Encounter (HOSPITAL_BASED_OUTPATIENT_CLINIC_OR_DEPARTMENT_OTHER): Payer: Self-pay | Admitting: Family Medicine

## 2022-01-20 ENCOUNTER — Telehealth (HOSPITAL_COMMUNITY): Payer: Self-pay

## 2022-01-20 NOTE — Telephone Encounter (Signed)
Called to schedule mri/ultrasound, no answer, left vm. AW

## 2022-01-22 ENCOUNTER — Other Ambulatory Visit (HOSPITAL_COMMUNITY): Payer: Self-pay | Admitting: Interventional Radiology

## 2022-01-22 DIAGNOSIS — I6521 Occlusion and stenosis of right carotid artery: Secondary | ICD-10-CM

## 2022-01-29 ENCOUNTER — Ambulatory Visit (HOSPITAL_COMMUNITY)
Admission: RE | Admit: 2022-01-29 | Discharge: 2022-01-29 | Disposition: A | Discharge status: Home / Self Care | Payer: 59 | Source: Ambulatory Visit | Attending: Interventional Radiology | Admitting: Interventional Radiology

## 2022-01-29 DIAGNOSIS — I6521 Occlusion and stenosis of right carotid artery: Secondary | ICD-10-CM | POA: Diagnosis not present

## 2022-02-05 ENCOUNTER — Ambulatory Visit (HOSPITAL_COMMUNITY)
Admission: RE | Admit: 2022-02-05 | Discharge: 2022-02-05 | Disposition: A | Discharge status: Home / Self Care | Payer: 59 | Source: Ambulatory Visit | Attending: Interventional Radiology | Admitting: Interventional Radiology

## 2022-02-05 DIAGNOSIS — I6521 Occlusion and stenosis of right carotid artery: Secondary | ICD-10-CM | POA: Diagnosis present

## 2022-02-06 ENCOUNTER — Ambulatory Visit (INDEPENDENT_AMBULATORY_CARE_PROVIDER_SITE_OTHER): Payer: 59 | Admitting: Family Medicine

## 2022-02-06 ENCOUNTER — Telehealth (HOSPITAL_COMMUNITY): Payer: Self-pay

## 2022-02-06 ENCOUNTER — Encounter (HOSPITAL_BASED_OUTPATIENT_CLINIC_OR_DEPARTMENT_OTHER): Payer: Self-pay | Admitting: Family Medicine

## 2022-02-06 VITALS — BP 128/85 | HR 71 | Ht 66.0 in | Wt 205.4 lb

## 2022-02-06 DIAGNOSIS — E039 Hypothyroidism, unspecified: Secondary | ICD-10-CM

## 2022-02-06 DIAGNOSIS — E785 Hyperlipidemia, unspecified: Secondary | ICD-10-CM

## 2022-02-06 DIAGNOSIS — R7303 Prediabetes: Secondary | ICD-10-CM | POA: Diagnosis not present

## 2022-02-06 DIAGNOSIS — I10 Essential (primary) hypertension: Secondary | ICD-10-CM | POA: Diagnosis not present

## 2022-02-06 MED ORDER — ROSUVASTATIN CALCIUM 20 MG PO TABS: 20.0000 mg | ORAL_TABLET | Freq: Every day | ORAL | 0 refills | Status: DC

## 2022-02-06 MED ORDER — AMLODIPINE BESYLATE 5 MG PO TABS: 5.0000 mg | ORAL_TABLET | Freq: Every day | ORAL | 0 refills | Status: DC

## 2022-02-06 NOTE — Assessment & Plan Note (Signed)
Not currently on any medications for diabetes.  Denies chest pain, shortness of breath, vision changes, polydipsia, polyphagia, polyuria.  Last A1c in January 2023 6.1.  Does not monitor her blood sugars at home.  She has made some lifestyle changes, trying to walk more, not eating fast food, cooking more at home, eliminated processed carbs and no sugar drinks.  Will get A1c today.  Will follow-up in 3 months with PCP.

## 2022-02-06 NOTE — Assessment & Plan Note (Signed)
Taking levothyroxine 75 mcg daily before breakfast.  Denies fatigue, cold intolerance, constipation, weight gain, memory issues, periorbital edema, dry skin.  "I am feeling pretty good "last TSH 3.880 on 08/22/2021 will check TSH, T4 today.  Continue levothyroxine 75 mcg daily.  Follow-up in 3 months.

## 2022-02-06 NOTE — Assessment & Plan Note (Addendum)
Taking amlodipine 5 mg daily as prescribed.  Denies chest pain, shortness of breath, lower extremity edema, vision changes and headaches.  Last labs were [place] on July 03, 1921, will get labs today.  Monitors her blood pressure at home approximately once per month.  Tolerating amlodipine with no reported side effects.  Blood pressure well-controlled.  She will continue taking amlodipine 5 mg daily she will follow-up in 3 months with PCP. Refills sent.

## 2022-02-06 NOTE — Progress Notes (Signed)
Established Patient Office Visit  Subjective   Patient ID: Vicki Gonzales, female    DOB: [birth year]  Age: 57 y.o. MRN: [id number]  Chief Complaint  Patient presents with   Follow-up    Pt here for f/u on thyroid, blood pressure and preDM, pt only concern is lack of good sleep, need some refills on some of her medications     HPI Hypertension Medication compliance: amlodipine as prescribed Denies chest pain, shortness of breath, lower extremity edema, vision changes, headaches.  Pertinent lab work: last on 07/03/21, will get today Monitoring: monitors BP at least once per month Tolerating medication well: no side effects Continue current medication regimen: amlodipine 5 mg QD Follow-up: 3 months  Hypothyroid Medication compliance: levothyroxine 75 mcq daily before breakfast Taking medication on empty stomach 60 minutes before breakfast: yes Denies fatigue, cold intolerance, constipation, weight gain, memory issues, periorbital edema, dry skin. "I'm feeling pretty good"  Pertinent lab results: TSH 3.880 on 08/22/21 Current medication regimen: levothyroxine 75 mcg daily  Well-controlled: will check TSH today.   Diabetes: Medication compliance: not currently on medication (prediabetes)  Denies chest pain, shortness of breath, vision changes, polydipsia, polyphagia, polyuria.  Pertinent lab work: A1C: 6.1 in June 2023 Monitoring: blood sugar readings at home: n/a          Continue current medication regimen: not currently on medications.   Has been trying to walk more, not eating fast food, cooking more at home, eating more protein. Eliminating processed carbs, no  sugar drinks, stopped sweet tea, no alcohol.  Has been instituting these changes for past 6 months.   Hyperlipidemia:  Medication compliance: rosuvastatin 20 mg daily as prescribed Denies chest pain, shortness of breath, lower extremity edema, myalgias, muscle weakness, changes in appearance of  urine. Pertinent lab work: lipid panel on 07/03/21 triglycerides 173, will check today Monitoring: Q 6 months with lab Continue current medication regimen: rosuvastatin 20  mg daily Follow-up: 3 months  Review of Systems  Constitutional:  Negative for chills and fever.  Eyes:  Negative for blurred vision and double vision.  Respiratory:  Negative for shortness of breath.   Cardiovascular:  Negative for chest pain.  Gastrointestinal:  Negative for abdominal pain, nausea and vomiting.  Neurological:  Negative for headaches.      Objective:     BP 128/85 (BP Location: Left Arm, Patient Position: Sitting, Cuff Size: Large)   Pulse 71   Ht 5\' 6"  (1.676 m)   Wt 205 lb 6.4 oz (93.2 kg)   LMP 04/07/2019 (Approximate)   SpO2 99%   BMI 33.15 kg/m    Physical Exam Vitals and nursing note reviewed.  Constitutional:      General: She is not in acute distress.    Appearance: Normal appearance.  Cardiovascular:     Rate and Rhythm: Normal rate.     Heart sounds: Normal heart sounds.  Pulmonary:     Effort: Pulmonary effort is normal.     Breath sounds: Normal breath sounds.  Musculoskeletal:        General: Normal range of motion.  Skin:    General: Skin is warm and dry.     Capillary Refill: Capillary refill takes less than 2 seconds.  Neurological:     General: No focal deficit present.     Mental Status: She is alert. Mental status is at baseline.  Psychiatric:        Mood and Affect: Mood normal.  Behavior: Behavior normal.        Thought Content: Thought content normal.        Judgment: Judgment normal.      No results found for any visits on 02/06/22.    The ASCVD Risk score (Arnett DK, et al., 2019) failed to calculate for the following reasons:   The patient has a prior MI or stroke diagnosis    Assessment & Plan:   Problem List Items Addressed This Visit     Hypothyroidism - Primary    Taking levothyroxine 75 mcg daily before breakfast.  Denies  fatigue, cold intolerance, constipation, weight gain, memory issues, periorbital edema, dry skin.  "I am feeling pretty good "last TSH 3.880 on 08/22/2021 will check TSH, T4 today.  Continue levothyroxine 75 mcg daily.  Follow-up in 3 months.      Relevant Orders   TSH + free T4   Hyperlipidemia    Reports taking rosuvastatin 20 mg daily as prescribed.  Denies chest pain, shortness of breath, lower extremity edema, myalgias, muscle weakness, changes in appearance of urine.  Triglycerides elevated at 173 on last check in July 03, 2021.  Will check lipid panel today.  Will continue rosuvastatin 20 mg daily follow-up in 3 months with PCP. Refills sent.       Relevant Medications   amLODipine (NORVASC) 5 MG tablet   rosuvastatin (CRESTOR) 20 MG tablet   Other Relevant Orders   Lipid panel   Essential hypertension    Taking amlodipine 5 mg daily as prescribed.  Denies chest pain, shortness of breath, lower extremity edema, vision changes and headaches.  Last labs were [place] on July 03, 1921, will get labs today.  Monitors her blood pressure at home approximately once per month.  Tolerating amlodipine with no reported side effects.  Blood pressure well-controlled.  She will continue taking amlodipine 5 mg daily she will follow-up in 3 months with PCP. Refills sent.       Relevant Medications   amLODipine (NORVASC) 5 MG tablet   rosuvastatin (CRESTOR) 20 MG tablet   Prediabetes    Not currently on any medications for diabetes.  Denies chest pain, shortness of breath, vision changes, polydipsia, polyphagia, polyuria.  Last A1c in January 2023 6.1.  Does not monitor her blood sugars at home.  She has made some lifestyle changes, trying to walk more, not eating fast food, cooking more at home, eliminated processed carbs and no sugar drinks.  Will get A1c today.  Will follow-up in 3 months with PCP.      Relevant Orders   CBC with Differential/Platelet   Comprehensive metabolic panel   Hemoglobin  A1c  Agrees with plan of care discussed.  Questions answered. Discussed healthy diet and exercise.   Return in about 3 months (around 05/07/2022).    Chalmers Guest, FNP

## 2022-02-06 NOTE — Assessment & Plan Note (Addendum)
Reports taking rosuvastatin 20 mg daily as prescribed.  Denies chest pain, shortness of breath, lower extremity edema, myalgias, muscle weakness, changes in appearance of urine.  Triglycerides elevated at 173 on last check in July 03, 2021.  Will check lipid panel today.  Will continue rosuvastatin 20 mg daily follow-up in 3 months with PCP. Refills sent.

## 2022-02-06 NOTE — Telephone Encounter (Signed)
Pt agreed to f/u in 6 months with a US carotid bilateral. AB

## 2022-02-07 LAB — LIPID PANEL
Chol/HDL Ratio: 3.6 ratio (ref 0.0–4.4)
Cholesterol, Total: 142 mg/dL (ref 100–199)
HDL: 39 mg/dL — ABNORMAL LOW (ref >39)
LDL Chol Calc (NIH): 70 mg/dL (ref 0–99)
Triglycerides: 198 mg/dL — ABNORMAL HIGH (ref 0–149)
VLDL Cholesterol Cal: 33 mg/dL (ref 5–40)

## 2022-02-07 LAB — COMPREHENSIVE METABOLIC PANEL
ALT: 25 IU/L (ref 0–32)
AST: 19 IU/L (ref 0–40)
Albumin/Globulin Ratio: 1.9 (ref 1.2–2.2)
Albumin: 4.6 g/dL (ref 3.8–4.9)
Alkaline Phosphatase: 92 IU/L (ref 44–121)
BUN/Creatinine Ratio: 14 (ref 9–23)
BUN: 11 mg/dL (ref 6–24)
Bilirubin Total: 0.7 mg/dL (ref 0.0–1.2)
CO2: 22 mmol/L (ref 20–29)
Calcium: 9.6 mg/dL (ref 8.7–10.2)
Chloride: 100 mmol/L (ref 96–106)
Creatinine, Ser: 0.78 mg/dL (ref 0.57–1.00)
Globulin, Total: 2.4 g/dL (ref 1.5–4.5)
Glucose: 102 mg/dL — ABNORMAL HIGH (ref 70–99)
Potassium: 4.2 mmol/L (ref 3.5–5.2)
Sodium: 139 mmol/L (ref 134–144)
Total Protein: 7 g/dL (ref 6.0–8.5)
eGFR: 88 mL/min/{1.73_m2} (ref >59)

## 2022-02-07 LAB — CBC WITH DIFFERENTIAL/PLATELET
Basophils Absolute: 0.1 10*3/uL (ref 0.0–0.2)
Basos: 1 %
EOS (ABSOLUTE): 0.2 10*3/uL (ref 0.0–0.4)
Eos: 3 %
Hematocrit: 41.1 % (ref 34.0–46.6)
Hemoglobin: 13.5 g/dL (ref 11.1–15.9)
Immature Grans (Abs): 0 10*3/uL (ref 0.0–0.1)
Immature Granulocytes: 0 %
Lymphocytes Absolute: 2.1 10*3/uL (ref 0.7–3.1)
Lymphs: 25 %
MCH: 28.9 pg (ref 26.6–33.0)
MCHC: 32.8 g/dL (ref 31.5–35.7)
MCV: 88 fL (ref 79–97)
Monocytes Absolute: 0.5 10*3/uL (ref 0.1–0.9)
Monocytes: 7 %
Neutrophils Absolute: 5.2 10*3/uL (ref 1.4–7.0)
Neutrophils: 64 %
Platelets: 329 10*3/uL (ref 150–450)
RBC: 4.67 x10E6/uL (ref 3.77–5.28)
RDW: 13.5 % (ref 11.7–15.4)
WBC: 8.1 10*3/uL (ref 3.4–10.8)

## 2022-02-07 LAB — HEMOGLOBIN A1C
Est. average glucose Bld gHb Est-mCnc: 140 mg/dL
Hgb A1c MFr Bld: 6.5 % — ABNORMAL HIGH (ref 4.8–5.6)

## 2022-02-07 LAB — TSH+FREE T4
Free T4: 1.06 ng/dL (ref 0.82–1.77)
TSH: 9.5 u[IU]/mL — ABNORMAL HIGH (ref 0.450–4.500)

## 2022-02-09 ENCOUNTER — Other Ambulatory Visit: Payer: Self-pay | Admitting: Family Medicine

## 2022-02-09 DIAGNOSIS — E119 Type 2 diabetes mellitus without complications: Secondary | ICD-10-CM | POA: Insufficient documentation

## 2022-02-09 DIAGNOSIS — E039 Hypothyroidism, unspecified: Secondary | ICD-10-CM

## 2022-02-12 ENCOUNTER — Ambulatory Visit (HOSPITAL_BASED_OUTPATIENT_CLINIC_OR_DEPARTMENT_OTHER): Payer: 59 | Admitting: Family Medicine

## 2022-03-19 ENCOUNTER — Encounter (HOSPITAL_BASED_OUTPATIENT_CLINIC_OR_DEPARTMENT_OTHER): Payer: Self-pay

## 2022-05-07 ENCOUNTER — Ambulatory Visit (INDEPENDENT_AMBULATORY_CARE_PROVIDER_SITE_OTHER): Payer: 59 | Admitting: Family Medicine

## 2022-05-07 ENCOUNTER — Encounter (HOSPITAL_BASED_OUTPATIENT_CLINIC_OR_DEPARTMENT_OTHER): Payer: Self-pay | Admitting: Family Medicine

## 2022-05-07 VITALS — BP 134/64 | HR 63 | Ht 66.0 in | Wt 200.3 lb

## 2022-05-07 DIAGNOSIS — E039 Hypothyroidism, unspecified: Secondary | ICD-10-CM | POA: Diagnosis not present

## 2022-05-07 DIAGNOSIS — E119 Type 2 diabetes mellitus without complications: Secondary | ICD-10-CM | POA: Diagnosis not present

## 2022-05-07 LAB — POCT GLYCOSYLATED HEMOGLOBIN (HGB A1C): Hemoglobin A1C: 6.3 % — AB (ref 4.0–5.6)

## 2022-05-07 NOTE — Progress Notes (Signed)
Established Patient Office Visit  Subjective   Patient ID: Vicki Gonzales, female    DOB: 12/07/1965  Age: 57 y.o. MRN: [id number]  Vicki Gonzales is a 57 yo female patient who presents today for routine follow-up on her chronic medical conditions.   Diabetes:  Medication compliance: not currently on medication Denies chest pain, shortness of breath, vision changes, polydipsia, polyphagia, polyuria.  Pertinent lab work: A1C: 6.5 on 02/06/2022 Does not monitor blood sugar readings at home Lifestyle modifications: drinking black coffee, cut out all sugar since [place], avoiding biscuits, no snacks/junk. Some numbness in L foot sometimes and her knees bother her and limits her ability to participate in physical activity.   Hypothyroid Medication compliance: levothyroxine 75 mcq daily before breakfast Taking medication on empty stomach 60 minutes before breakfast: yes Denies fatigue, cold intolerance, constipation, weight gain, memory issues, periorbital edema, dry skin. Reports her nails and hair are growing out now.  In perimenopausal state-- sleeping better.  Pertinent lab results: TSH 9.500, 02/06/22  Current medication regimen: levothyroxine 75 mcg daily  Well-controlled: will check TSH today.   Review of Systems  Constitutional:  Positive for weight loss (actively trying to lose weight). Negative for malaise/fatigue.  Eyes:  Negative for blurred vision and double vision.  Respiratory:  Negative for cough and shortness of breath.   Cardiovascular:  Negative for chest pain.  Gastrointestinal:  Negative for abdominal pain, nausea and vomiting.  Musculoskeletal:  Negative for myalgias.  Neurological:  Negative for dizziness, weakness and headaches.  Psychiatric/Behavioral:  Negative for depression and suicidal ideas. The patient is not nervous/anxious.     Objective:    BP 134/64   Pulse 63   Ht 5\' 6"  (1.676 m)   Wt 200 lb 4.8 oz (90.9 kg)   LMP 04/07/2019  (Approximate)   SpO2 99%   BMI 32.33 kg/m  BP Readings from Last 3 Encounters:  05/07/22 134/64  02/06/22 128/85  11/12/21 125/83     Physical Exam Constitutional:      Appearance: Normal appearance.  Cardiovascular:     Rate and Rhythm: Normal rate and regular rhythm.     Pulses: Normal pulses.     Heart sounds: Normal heart sounds.  Pulmonary:     Effort: Pulmonary effort is normal.     Breath sounds: Normal breath sounds.  Neurological:     Mental Status: She is alert.  Psychiatric:        Mood and Affect: Mood normal.        Behavior: Behavior normal.        Thought Content: Thought content normal.        Judgment: Judgment normal.      Assessment & Plan:  1. Diabetes mellitus without complication (HCC) Patient reports lifestyle modifications to help improve her hemoglobin A1c. She is mainly focused on diet modifications to help improve her blood sugar levels and help with weight loss. A1c last checked 02/06/22 with a result of 6.5. She is currently not taking any medication and does not check her blood sugar at home. Would like to compare POCT A1c with blood draw A1c. POCT A1c test is 6.3 in office. Based on patient's report of diet adjustments and recent 5 pound weight loss in past 3 months, her A1c has decreased. Will notify patient with A1c results from blood draw. Encouraged her to continue to focus on diet modifications and to focus on daily physical activity as she tolerates.  - POCT HgB  A1C - Hemoglobin A1c  2. Hypothyroidism, unspecified type Patient currently taking 75 mcg levothyroxine for hypothyroidism. Reports that she is seeing growth in her hair and nails now that her thyroid function has most likely normalized with pharmacologic intervention. Denies fatigue, cold intolerance, constipation, weight gain, memory issues, periorbital edema, dry skin. Plan to recheck TSH today. Will notify patient regarding results and adjust medication if necessary.  - TSH Rfx on  Abnormal to Free T4   Return in about 3 months (around 08/07/2022) for Pre-diabetes f/u.    Vicki Reedy, FNP

## 2022-05-08 ENCOUNTER — Other Ambulatory Visit (HOSPITAL_BASED_OUTPATIENT_CLINIC_OR_DEPARTMENT_OTHER): Payer: Self-pay | Admitting: Family Medicine

## 2022-05-08 DIAGNOSIS — E039 Hypothyroidism, unspecified: Secondary | ICD-10-CM

## 2022-05-08 LAB — HEMOGLOBIN A1C
Est. average glucose Bld gHb Est-mCnc: 131 mg/dL
Hgb A1c MFr Bld: 6.2 % — ABNORMAL HIGH (ref 4.8–5.6)

## 2022-05-08 LAB — T4F: T4,Free (Direct): 1.17 ng/dL (ref 0.82–1.77)

## 2022-05-08 LAB — TSH RFX ON ABNORMAL TO FREE T4: TSH: 4.62 u[IU]/mL — ABNORMAL HIGH (ref 0.450–4.500)

## 2022-05-08 MED ORDER — LEVOTHYROXINE SODIUM 88 MCG PO TABS: 88.0000 ug | ORAL_TABLET | Freq: Every day | ORAL | 2 refills | Status: DC

## 2022-05-09 ENCOUNTER — Other Ambulatory Visit (HOSPITAL_BASED_OUTPATIENT_CLINIC_OR_DEPARTMENT_OTHER): Payer: Self-pay | Admitting: Family Medicine

## 2022-05-09 DIAGNOSIS — I10 Essential (primary) hypertension: Secondary | ICD-10-CM

## 2022-05-09 DIAGNOSIS — E785 Hyperlipidemia, unspecified: Secondary | ICD-10-CM

## 2022-05-12 MED ORDER — ROSUVASTATIN CALCIUM 20 MG PO TABS: 20.0000 mg | ORAL_TABLET | Freq: Every day | ORAL | 0 refills | Status: DC

## 2022-05-12 MED ORDER — AMLODIPINE BESYLATE 5 MG PO TABS: 5.0000 mg | ORAL_TABLET | Freq: Every day | ORAL | 0 refills | Status: DC

## 2022-05-13 NOTE — Progress Notes (Signed)
Pt was called in regards to labs and she is aware. Pt lab appointment was made for six weeks to have labs done.

## 2022-06-18 ENCOUNTER — Telehealth (HOSPITAL_BASED_OUTPATIENT_CLINIC_OR_DEPARTMENT_OTHER): Payer: Self-pay | Admitting: Family Medicine

## 2022-06-18 NOTE — Telephone Encounter (Signed)
lvm to call back to schedule diabetic retina eye screening for 06/26/22

## 2022-06-24 ENCOUNTER — Other Ambulatory Visit (HOSPITAL_BASED_OUTPATIENT_CLINIC_OR_DEPARTMENT_OTHER): Payer: 59

## 2022-06-24 ENCOUNTER — Other Ambulatory Visit (HOSPITAL_BASED_OUTPATIENT_CLINIC_OR_DEPARTMENT_OTHER): Payer: Self-pay | Admitting: Family Medicine

## 2022-06-24 DIAGNOSIS — E039 Hypothyroidism, unspecified: Secondary | ICD-10-CM

## 2022-06-25 ENCOUNTER — Other Ambulatory Visit (HOSPITAL_BASED_OUTPATIENT_CLINIC_OR_DEPARTMENT_OTHER): Payer: Self-pay | Admitting: Family Medicine

## 2022-06-25 DIAGNOSIS — E039 Hypothyroidism, unspecified: Secondary | ICD-10-CM

## 2022-06-25 LAB — HEMOGLOBIN A1C
Est. average glucose Bld gHb Est-mCnc: 137 mg/dL
Hgb A1c MFr Bld: 6.4 % — ABNORMAL HIGH (ref 4.8–5.6)

## 2022-06-25 LAB — TSH: TSH: 5.43 u[IU]/mL — ABNORMAL HIGH (ref 0.450–4.500)

## 2022-06-25 MED ORDER — LEVOTHYROXINE SODIUM 100 MCG PO TABS: 100.0000 ug | ORAL_TABLET | Freq: Every day | ORAL | 2 refills | Status: DC

## 2022-06-26 ENCOUNTER — Emergency Department (HOSPITAL_COMMUNITY): Payer: 59

## 2022-06-26 ENCOUNTER — Other Ambulatory Visit: Payer: Self-pay

## 2022-06-26 ENCOUNTER — Emergency Department (HOSPITAL_COMMUNITY)
Admission: EM | Admit: 2022-06-26 | Discharge: 2022-06-26 | Disposition: A | Discharge status: Home / Self Care | Payer: 59 | Attending: Emergency Medicine | Admitting: Emergency Medicine

## 2022-06-26 ENCOUNTER — Encounter (HOSPITAL_COMMUNITY): Payer: Self-pay | Admitting: Emergency Medicine

## 2022-06-26 ENCOUNTER — Other Ambulatory Visit (HOSPITAL_BASED_OUTPATIENT_CLINIC_OR_DEPARTMENT_OTHER): Payer: Self-pay | Admitting: Family Medicine

## 2022-06-26 DIAGNOSIS — M25522 Pain in left elbow: Secondary | ICD-10-CM | POA: Insufficient documentation

## 2022-06-26 DIAGNOSIS — E039 Hypothyroidism, unspecified: Secondary | ICD-10-CM

## 2022-06-26 DIAGNOSIS — M25512 Pain in left shoulder: Secondary | ICD-10-CM | POA: Insufficient documentation

## 2022-06-26 MED ORDER — KETOROLAC TROMETHAMINE 30 MG/ML IJ SOLN
30.0000 mg | Freq: Once | INTRAMUSCULAR | Status: AC
  Administered 2022-06-26: 30 mg via INTRAMUSCULAR
  Filled 2022-06-26: qty 1

## 2022-06-26 MED ORDER — OXYCODONE-ACETAMINOPHEN 5-325 MG PO TABS: 1.0000 | ORAL_TABLET | Freq: Four times a day (QID) | ORAL | 0 refills | Status: DC | PRN

## 2022-06-26 NOTE — ED Provider Notes (Signed)
[facility]ER[facility]ENC[facility]PT [facility] Emergency Department Provider Note MRN:  [id number]  Arrival date & time: 06/26/22     Chief Complaint   Shoulder Pain   History of Present Illness   Vicki Gonzales is a 57 y.o. year-old female presents to the ED with chief complaint of left shoulder pain.  She states that she was taking out the trash and as she swung the bag of trash into the dumpster she felt significant pain in her left shoulder.  She states that now she cannot lift her left arm.  She also complains of some pain in the left elbow.  She states that she has taken Tylenol and biofreeze.  History provided by patient.   Review of Systems  Pertinent positive and negative review of systems noted in HPI.    Physical Exam   Vitals:   06/26/22 0511  BP: (!) 143/84  Pulse: 61  Resp: 18  Temp: 98 F (36.7 C)  SpO2: 96%    CONSTITUTIONAL:  well-appearing, NAD NEURO:  Alert and oriented x 3, CN 3-12 grossly intact EYES:  eyes equal and reactive ENT/NECK:  Supple, no stridor  CARDIO:  appears well-perfused  PULM:  No respiratory distress,  GI/GU:  non-distended,  MSK/SPINE:  No gross deformities, no edema, unable to move left arm above ~30 degrees ABduction/extension, unable to test empty can, TTP of the left shoulder SKIN:  no rash, atraumatic   *Additional and/or pertinent findings included in MDM below  Diagnostic and Interventional Summary    EKG Interpretation  Date/Time:    Ventricular Rate:    PR Interval:    QRS Duration:   QT Interval:    QTC Calculation:   R Axis:     Text Interpretation:         Labs Reviewed - No data to display  DG Shoulder Left  Final Result    DG Elbow Complete Left  Final Result      Medications  ketorolac (TORADOL) 30 MG/ML injection 30 mg (30 mg Intramuscular Given 06/26/22 0603)     Procedures  /  Critical Care Procedures  ED Course and Medical Decision Making  I have reviewed the triage vital signs, the  nursing notes, and pertinent available records from the EMR.  Social Determinants Affecting Complexity of Care: Patient has no clinically significant social determinants affecting this chief complaint..   ED Course:    Medical Decision Making Patient presents with injury to left shoulder.  DDx includes, fracture, strain, or sprain.  Consultants: none  Plain films reveal no fracture.  Pt advised to follow up with PCP and/or orthopedics. Patient given sling while in ED, conservative therapy such as RICE recommended and discussed.   Patient will be discharged home & is agreeable with above plan. Returns precautions discussed. Pt appears safe for discharge.   Amount and/or Complexity of Data Reviewed Radiology: ordered.  Risk Prescription drug management.         Consultants: No consultations were needed in caring for this patient.   Treatment and Plan: Emergency department workup does not suggest an emergent condition requiring admission or immediate intervention beyond  what has been performed at this time. The patient is safe for discharge and has  been instructed to return immediately for worsening symptoms, change in  symptoms or any other concerns    Final Clinical Impressions(s) / ED Diagnoses     ICD-10-CM   1. Acute pain of left shoulder  M25.512  ED Discharge Orders          Ordered    oxyCODONE-acetaminophen (PERCOCET/ROXICET) 5-325 MG tablet  Every 6 hours PRN        06/26/22 0618              Discharge Instructions Discussed with and Provided to Patient:   Discharge Instructions   None      Roxy Horseman, PA-C 06/26/22 [id number]    Shon Baton, MD 06/27/22 [phone]

## 2022-06-26 NOTE — ED Triage Notes (Signed)
Patient from home with complaints of left shoulder pain radiating down to elbow. This started after she had to lift a heavy trash bag into her trash can yesterday around 4 p.m. Since then patient has not been able to move arm independently. No deformities noted, shoulder is tender to palpation. Patient suffers from a stroke in the past so states she always has numbness and tingling. Able to move fingers w/o issue. AOX4.

## 2022-06-30 ENCOUNTER — Encounter (HOSPITAL_BASED_OUTPATIENT_CLINIC_OR_DEPARTMENT_OTHER): Payer: Self-pay | Admitting: Family Medicine

## 2022-06-30 ENCOUNTER — Other Ambulatory Visit (HOSPITAL_BASED_OUTPATIENT_CLINIC_OR_DEPARTMENT_OTHER): Payer: Self-pay | Admitting: Family Medicine

## 2022-06-30 ENCOUNTER — Ambulatory Visit (INDEPENDENT_AMBULATORY_CARE_PROVIDER_SITE_OTHER): Payer: Managed Care, Other (non HMO) | Admitting: Family Medicine

## 2022-06-30 VITALS — BP 128/73 | HR 76 | Ht 66.0 in | Wt 205.0 lb

## 2022-06-30 DIAGNOSIS — E039 Hypothyroidism, unspecified: Secondary | ICD-10-CM

## 2022-06-30 DIAGNOSIS — S4990XA Unspecified injury of shoulder and upper arm, unspecified arm, initial encounter: Secondary | ICD-10-CM | POA: Insufficient documentation

## 2022-06-30 DIAGNOSIS — S4992XA Unspecified injury of left shoulder and upper arm, initial encounter: Secondary | ICD-10-CM | POA: Diagnosis not present

## 2022-06-30 DIAGNOSIS — E119 Type 2 diabetes mellitus without complications: Secondary | ICD-10-CM | POA: Diagnosis not present

## 2022-06-30 MED ORDER — METFORMIN HCL 500 MG PO TABS: 500.0000 mg | ORAL_TABLET | Freq: Two times a day (BID) | ORAL | 1 refills | Status: DC

## 2022-06-30 NOTE — Progress Notes (Signed)
    Procedures performed today:    None.  Independent interpretation of notes and tests performed by another provider:   None.  Brief History, Exam, Impression, and Recommendations:    BP 128/73 (BP Location: Right Arm, Patient Position: Sitting, Cuff Size: Normal)   Pulse 76   Ht 5\' 6"  (1.676 m)   Wt 205 lb (93 kg)   LMP 04/07/2019 (Approximate)   SpO2 100%   BMI 33.09 kg/m   Diabetes mellitus without complication (HCC) Assessment & Plan: Hemoglobin A1c was 6.5% earlier this year, most recent last week was 6.4%.  Does remain stable.  No reported issues with polyuria or polydipsia.  We did discuss medication consideration, reviewed option of metformin to help with controlling blood sugars.  Reviewed potential risk with medication. After discussion, patient is amenable to starting metformin to help with controlling blood sugars.  Instructed on proper use of medication, potential side effects to be aware of We will complete urine microalbumin/creatinine ratio today Plan to complete foot exam at future office visit  Orders: -     Microalbumin / creatinine urine ratio  Injury of left shoulder, initial encounter Assessment & Plan: Few days ago, patient was seen in the emergency department due to left shoulder pain.  She indicates that she was taking the trash when she swung the bag of trash into a dumpster and felt significant pain in her left shoulder at the time.  Pain has been significant enough that she has not been able to move her arm as well.  In the emergency department, he did complete x-rays which were generally unremarkable, no evidence of acute bony abnormality, no fracture observed.  She was placed in sling while in the emergency department.  She was also prescribed Percocet to help with pain control. On exam, patient with some limitations in regards to active and passive range of motion related to underlying pain.  Does have reduced strength in left shoulder as compared to  right.  Normal distal grip strength, normal sensation distally, normal radial pulse. Reviewed prior imaging, Emergency Department documentation.  Discussed general considerations with patient today.  Would be reasonable to proceed conservatively, can look to initiate physical therapy and we will plan for close follow-up to monitor progress with this.  If symptoms not improving as expected, low threshold to proceed with MRI for further evaluation of underlying structures including rotator cuff. Will plan for follow-up in about 2 to 3 months to monitor progress or sooner as needed, particularly if any worsening in symptoms does occur  Orders: -     Ambulatory referral to Physical Therapy  Hypothyroidism, unspecified type Assessment & Plan: Patient continues with levothyroxine 75 mcg daily.  Last TSH was about 1 week ago and this was slightly above normal range.  Foye Clock recommended increase in dose to 100 mcg dose of levothyroxine which is reasonable..  Today, clinically patient is euthyroid.  Can continue with dose adjustment of levothyroxine Will plan for rechecking TSH in about 6 weeks to assess response to new dose of levothyroxine   Other orders -     metFORMIN HCl; Take 1 tablet (500 mg total) by mouth 2 (two) times daily with a meal.  Dispense: 180 tablet; Refill: 1  Return in about 2 months (around 08/30/2022).   ___________________________________________ Kahlen Morais [name], MD, ABFM, CAQSM Primary Care and Sports Medicine [facility]

## 2022-07-01 ENCOUNTER — Other Ambulatory Visit (HOSPITAL_BASED_OUTPATIENT_CLINIC_OR_DEPARTMENT_OTHER): Payer: Self-pay | Admitting: Family Medicine

## 2022-07-01 LAB — MICROALBUMIN / CREATININE URINE RATIO
Creatinine, Urine: 315.2 mg/dL
Microalb/Creat Ratio: 14 mg/g creat (ref 0–29)
Microalbumin, Urine: 44.6 ug/mL

## 2022-07-07 ENCOUNTER — Other Ambulatory Visit (HOSPITAL_COMMUNITY): Payer: Self-pay | Admitting: Interventional Radiology

## 2022-07-07 ENCOUNTER — Telehealth (HOSPITAL_COMMUNITY): Payer: Self-pay

## 2022-07-07 DIAGNOSIS — I6521 Occlusion and stenosis of right carotid artery: Secondary | ICD-10-CM

## 2022-07-07 NOTE — Telephone Encounter (Signed)
Called to schedule us carotid, no answer, left vm. AB  

## 2022-07-14 ENCOUNTER — Other Ambulatory Visit (HOSPITAL_BASED_OUTPATIENT_CLINIC_OR_DEPARTMENT_OTHER): Payer: Self-pay

## 2022-07-14 ENCOUNTER — Encounter (HOSPITAL_BASED_OUTPATIENT_CLINIC_OR_DEPARTMENT_OTHER): Payer: Self-pay | Admitting: Family Medicine

## 2022-07-14 MED ORDER — MELOXICAM 7.5 MG PO TABS: 7.5000 mg | ORAL_TABLET | Freq: Every day | ORAL | 0 refills | Status: DC

## 2022-07-14 NOTE — Telephone Encounter (Signed)
Patient sent message about anti inflammatory medication, she spoke with provider over the phone on 07/03 note is in chart. Provider stated he would sent Meloxicam to pharmacy, he did not send it in. I sent in medication for patient, with the documentation from provider as permission. Spoke with patient about this, gave a verbal understanding.

## 2022-07-17 ENCOUNTER — Other Ambulatory Visit (HOSPITAL_BASED_OUTPATIENT_CLINIC_OR_DEPARTMENT_OTHER): Payer: Self-pay | Admitting: Family Medicine

## 2022-07-17 ENCOUNTER — Other Ambulatory Visit (HOSPITAL_BASED_OUTPATIENT_CLINIC_OR_DEPARTMENT_OTHER): Payer: Self-pay | Admitting: Nurse Practitioner

## 2022-07-18 ENCOUNTER — Telehealth (HOSPITAL_BASED_OUTPATIENT_CLINIC_OR_DEPARTMENT_OTHER): Payer: Self-pay | Admitting: *Deleted

## 2022-07-18 NOTE — Telephone Encounter (Signed)
Called to offer colon cancer screening. Pt thinks that it has not been 10 years since the last colonoscopy. She had last colonoscopy at [facility]. Will request records from this office.

## 2022-07-21 ENCOUNTER — Encounter (HOSPITAL_BASED_OUTPATIENT_CLINIC_OR_DEPARTMENT_OTHER): Payer: Self-pay | Admitting: *Deleted

## 2022-07-21 ENCOUNTER — Other Ambulatory Visit (HOSPITAL_BASED_OUTPATIENT_CLINIC_OR_DEPARTMENT_OTHER): Payer: Self-pay

## 2022-07-21 ENCOUNTER — Encounter (HOSPITAL_BASED_OUTPATIENT_CLINIC_OR_DEPARTMENT_OTHER): Payer: Self-pay | Admitting: Family Medicine

## 2022-07-21 DIAGNOSIS — F419 Anxiety disorder, unspecified: Secondary | ICD-10-CM

## 2022-07-21 MED ORDER — ESCITALOPRAM OXALATE 10 MG PO TABS: 10.0000 mg | ORAL_TABLET | Freq: Every day | ORAL | 0 refills | Status: DC

## 2022-07-28 NOTE — Therapy (Signed)
OUTPATIENT PHYSICAL THERAPY SHOULDER EVALUATION   Patient Name: Vicki Gonzales MRN: [id number] DOB:[birth year], 57 y.o., female Today's Date: 07/30/2022  END OF SESSION:  PT End of Session - 07/30/22 2207     Visit Number 1    Number of Visits 12    Date for PT Re-Evaluation 09/09/22    Authorization Type [employer]    PT Start Time 1405    PT Stop Time 1510    PT Time Calculation (min) 65 min    Activity Tolerance Patient tolerated treatment well    Behavior During Therapy WFL for tasks assessed/performed             Past Medical History:  Diagnosis Date   ADHD    Anxiety    Carpal tunnel syndrome on right    Depression    HTN (hypertension)    Hypothyroidism    Migraines    Spondylosis    Stroke [facility])    Past Surgical History:  Procedure Laterality Date   ANTERIOR CRUCIATE LIGAMENT REPAIR Left 2021   APPENDECTOMY     BUBBLE STUDY  08/17/2020   Procedure: BUBBLE STUDY;  Surgeon: Jodelle Red, MD;  Location: [facility] ENDOSCOPY;  Service: Cardiovascular;;   FALSE ANEURYSM REPAIR Right 08/15/2020   Procedure: REPAIR RIGHT FEMORAL PSEUDOANEURYSM;  Surgeon: Nada Libman, MD;  Location: [facility] OR;  Service: Vascular;  Laterality: Right;   IR ANGIO INTRA EXTRACRAN SEL COM CAROTID INNOMINATE UNI L MOD SED  08/14/2020   IR ANGIO INTRA EXTRACRAN SEL INTERNAL CAROTID UNI R MOD SED  08/14/2020   IR ANGIO VERTEBRAL SEL SUBCLAVIAN INNOMINATE BILAT MOD SED  08/14/2020   IR CT HEAD LTD  08/14/2020   IR CT HEAD LTD  08/14/2020   IR INTRA CRAN STENT  08/14/2020   IR INTRAVSC STENT CERV CAROTID W/O EMB-PROT MOD SED INC ANGIO  08/14/2020   IR RADIOLOGIST EVAL & MGMT  09/24/2020   PATENT FORAMEN OVALE(PFO) CLOSURE N/A 10/24/2020   Procedure: PATENT FORAMEN OVALE (PFO) CLOSURE;  Surgeon: Tonny Bollman, MD;  Location: [facility] INVASIVE CV LAB;  Service: Cardiovascular;  Laterality: N/A;   RADIOLOGY WITH ANESTHESIA N/A 08/14/2020   Procedure: IR WITH ANESTHESIA;  Surgeon: Julieanne Cotton, MD;   Location: [facility] OR;  Service: Radiology;  Laterality: N/A;   TEE WITHOUT CARDIOVERSION N/A 08/17/2020   Procedure: TRANSESOPHAGEAL ECHOCARDIOGRAM (TEE);  Surgeon: Jodelle Red, MD;  Location: [facility] ENDOSCOPY;  Service: Cardiovascular;  Laterality: N/A;   TUBAL LIGATION     Patient Active Problem List   Diagnosis Date Noted   Shoulder injury 06/30/2022   Diabetes mellitus without complication (HCC) 02/09/2022   Obesity (BMI 30.0-34.9) 11/12/2021   Anxiety 04/10/2021   Decreased hearing of left ear 04/10/2021   Depression 01/02/2021   Essential hypertension 01/02/2021   Right middle cerebral artery stroke (HCC) 08/22/2020   Hypothyroidism    Hyperlipidemia    PFO (patent foramen ovale)    Internal carotid artery occlusion, right 08/14/2020   Acute right MCA stroke (HCC) 08/12/2020   Cerebrovascular accident (CVA) (HCC)     PCP: [name], [name], MD   REFERRING PROVIDER: [name], [name], MD   REFERRING DIAG: S49.92XA (ICD-10-CM) - Injury of left shoulder, initial encounter   THERAPY DIAG:  Left shoulder pain, unspecified chronicity  Stiffness of left shoulder, not elsewhere classified  Muscle weakness (generalized)  Rationale for Evaluation and Treatment: Rehabilitation  ONSET DATE: 06/25/2022  SUBJECTIVE:  SUBJECTIVE STATEMENT: Pt was swinging trash bag to throw into the garbage on 6/19.  She had an immediate terrible pain and was unable to move her L UE.  Pt went to ED on 6/20.  Pt had x rays of shoulder and elbow which were negative for fx though did show some deg changes.  Pt had an IM injection of toradol and was prescribed oxycodone (percocet).  Pt reports improved sx's and was able to sleep after the toradol injection.  Pt hasn't taken any of the oxycodone/percocet.  Pt was given a  sling.  Pt tried the sling though stopped wearing it because it pulled on her neck.  Pt was advised to follow up with PCP and/or orthopedics.   Pt saw PCP on 6/24 and he ordered PT.  PT order (06/30/22) indicated 1-2x/wk x 4-6 weeks  Pt reports her shoulder is stiff and she is very limited with shoulder movement and reaching.  Pt has pain with L shoulder movement.  Pt unable to reach overhead.  She has difficulty with dressing and bathing.  Pt is unable to perform hair care with L UE.  Pt has difficulty with bed mobility and unable to push up from her bed with L UE.  Pt uses her R UE to perform activities.  Pt is limited with preparing food and cooking.  Pt is limited with household chores and has to perform all with R UE.  Pt unable to use L UE with driving.  Pt is not lifting objects with L UE.  She is unable to hold/lift dog.    Hand dominance: Right  PERTINENT HISTORY: CVA in 08/22.  Pt had surgery including a stent-Pt still has residual numbness in L foot Patent foramen ovale closure 10/24/2020 Anxiety and depression HTN, DM, and CTS on R L ACL repair 2021   PAIN:  Are you having pain? Yes NPRS:  6-7/10 current, 8/10 worst, 3-4/10 best Location:  L shoulder, clavicle, pec, post UE.  Pt can also have a sharp stinging pain in her L hand.  PRECAUTIONS: {Therapy precautions:24002}   WEIGHT BEARING RESTRICTIONS: No  FALLS:  Has patient fallen in last 6 months? No  LIVING ENVIRONMENT: Lives with: lives with their spouse Lives in: 2 story home Stairs: yes  OCCUPATION: Pt not working.  Pt volunteers at church.    PLOF: {PLOF:24004}  PATIENT GOALS:improved mobility and strength.  NEXT MD VISIT:   OBJECTIVE:   DIAGNOSTIC FINDINGS:  Shoulder x rays on 6/20: FINDINGS: No evidence for an acute fracture. No findings to suggest shoulder separation or dislocation. Degenerative changes are noted at the acromioclavicular joint. Cortical irregularity at the rotator cuff insertion  of the humeral head is compatible with degenerative change.   IMPRESSION: Degenerative changes without acute bony findings.  Elbow x rays on 6/20: FINDINGS: No evidence for an acute fracture. No subluxation or dislocation. Degenerative spurring is seen on the coronoid process of the ulna. No fat pad elevation to suggest joint effusion.   IMPRESSION: Degenerative changes without acute bony findings.   No joint effusion.  PATIENT SURVEYS:  FOTO 28 with a goal of 57 at visit 15  COGNITION: Overall cognitive status: Within functional limits for tasks assessed      UPPER EXTREMITY ROM:   AROM/PROM Right eval Left eval  Shoulder flexion 176 65 with pain/126  Shoulder scaption 172 72 with pain  Shoulder abduction 145 64 with pain/86  Shoulder adduction    Shoulder internal rotation 65 70  Shoulder  external rotation 74 55/62  Elbow flexion    Elbow extension    Wrist flexion    Wrist extension    Wrist ulnar deviation    Wrist radial deviation    Wrist pronation    Wrist supination    (Blank rows = not tested) Pt has a compensatory shoulder hike on L with UE elevation  UPPER EXTREMITY MMT:  MMT Right eval Left eval  Shoulder flexion    Shoulder extension    Shoulder abduction    Shoulder adduction    Shoulder internal rotation 4/5 4+/5  Shoulder external rotation 4/5 Unable to tolerate  Middle trapezius    Lower trapezius    Elbow flexion    Elbow extension    Wrist flexion    Wrist extension    Wrist ulnar deviation    Wrist radial deviation    Wrist pronation    Wrist supination    Grip strength (lbs)    (Blank rows = not tested)  SHOULDER SPECIAL TESTS:  ER Lag: R: negative, L: positive Neer's Impingement test:  R: negative, L:  Pt is guarded. She had elbow pain.  Pt is limited with ROM and had shoulder hike.     PALPATION:  TTP:  L clavicle, ant, lat, and post shoulder, triceps, and distal biceps.  Increased tenderness to palpation in  posterior shoulder   TODAY'S TREATMENT:                                                                                                                                          Pt performed supine clasped hands flexion AAROM 2x5 reps, supine wand flexion x 2 reps, seated scap retraction x 10 reps, and seated table slides in flexion x 8 reps.  Pt received a HEP handout and was educated in correct form and appropriate frequency.  PT instructed pt to not perform into a painful range.  PT instructed pt she should not have pain with exercises and to stop exercises if she has pain.  PATIENT EDUCATION: Education details: objective findings, dx, HEP, restrictions/limitations, POC, and relevant anatomy.  PT answered pt's questions.  Person educated: Patient Education method: Explanation, Demonstration, Tactile cues, Verbal cues, and Handouts Education comprehension: verbalized understanding, returned demonstration, verbal cues required, and tactile cues required  HOME EXERCISE PROGRAM: Access Code: [email or web address] URL: [email or web address] Date: 07/29/2022 Prepared by: Aaron Edelman  Exercises - Supine Shoulder Flexion AAROM   - 2 x daily - 7 x weekly - 1-2 sets - 10 reps - Seated Scapular Retraction  - 2 x daily - 7 x weekly - 2 sets - 10 reps - Seated Shoulder Flexion Towel Slide at Table Top  - 2 x daily - 7 x weekly - 1 sets - 5 reps  ASSESSMENT:  CLINICAL IMPRESSION: Patient is a 57 y.o. female with a dx of L shoulder injury presenting to the clinic  with L shoulder pain, limited ROM in L shoulder, and muscle weakness in L UE.     -Pt reports her shoulder is stiff and she is very limited with shoulder movement and reaching.  Pt has pain with L shoulder movement.  Pt unable to reach overhead.  She has difficulty with dressing and bathing.  Pt is unable to perform hair care with L UE.  Pt has difficulty with bed mobility and unable to push up from her bed with L UE.  Pt uses her R UE to  perform activities.  Pt is limited with preparing food and cooking.  Pt is limited with household chores and has to perform all with R UE.  Pt unable to use L UE with driving.  Pt is not lifting objects with L UE.  She is unable to hold/lift dog.    OBJECTIVE IMPAIRMENTS: decreased activity tolerance, decreased ROM, decreased strength, hypomobility, impaired flexibility, impaired UE functional use, and pain.   ACTIVITY LIMITATIONS: carrying, lifting, bed mobility, bathing, dressing, reach over head, and hygiene/grooming  PARTICIPATION LIMITATIONS: meal prep, cleaning, laundry, shopping, and community activity  PERSONAL FACTORS: 1 comorbidity: CVA in 2022  are also affecting patient's functional outcome.   REHAB POTENTIAL: Good  CLINICAL DECISION MAKING: Stable/uncomplicated  EVALUATION COMPLEXITY: Low   GOALS:  SHORT TERM GOALS: Target date: ***  *** Baseline: Goal status: INITIAL  2.  *** Baseline:  Goal status: INITIAL  3.  *** Baseline:  Goal status: INITIAL  4.  *** Baseline:  Goal status: INITIAL  5.  *** Baseline:  Goal status: INITIAL  6.  *** Baseline:  Goal status: INITIAL  LONG TERM GOALS: Target date: ***  *** Baseline:  Goal status: INITIAL  2.  *** Baseline:  Goal status: INITIAL  3.  *** Baseline:  Goal status: INITIAL  4.  *** Baseline:  Goal status: INITIAL  5.  *** Baseline:  Goal status: INITIAL  6.  *** Baseline:  Goal status: INITIAL  PLAN:  PT FREQUENCY: {rehab frequency:25116}  PT DURATION: {rehab duration:25117}  PLANNED INTERVENTIONS: {rehab planned interventions:25118::"Therapeutic exercises","Therapeutic activity","Neuromuscular re-education","Balance training","Gait training","Patient/Family education","Self Care","Joint mobilization"}  PLAN FOR NEXT SESSION: Aaron Edelman, PT 07/30/2022, 10:29 PM

## 2022-07-29 ENCOUNTER — Ambulatory Visit (HOSPITAL_BASED_OUTPATIENT_CLINIC_OR_DEPARTMENT_OTHER): Payer: Managed Care, Other (non HMO) | Attending: Family Medicine | Admitting: Physical Therapy

## 2022-07-29 ENCOUNTER — Other Ambulatory Visit: Payer: Self-pay

## 2022-07-29 DIAGNOSIS — M25512 Pain in left shoulder: Secondary | ICD-10-CM | POA: Insufficient documentation

## 2022-07-29 DIAGNOSIS — S4992XA Unspecified injury of left shoulder and upper arm, initial encounter: Secondary | ICD-10-CM | POA: Diagnosis not present

## 2022-07-29 DIAGNOSIS — M6281 Muscle weakness (generalized): Secondary | ICD-10-CM | POA: Diagnosis present

## 2022-07-29 DIAGNOSIS — M25612 Stiffness of left shoulder, not elsewhere classified: Secondary | ICD-10-CM | POA: Insufficient documentation

## 2022-07-30 ENCOUNTER — Encounter (HOSPITAL_BASED_OUTPATIENT_CLINIC_OR_DEPARTMENT_OTHER): Payer: Self-pay | Admitting: Physical Therapy

## 2022-07-30 NOTE — Therapy (Incomplete)
OUTPATIENT PHYSICAL THERAPY SHOULDER EVALUATION   Patient Name: Vicki Gonzales MRN: [id number] DOB:Jun 23, 1965, 57 y.o., female Today's Date: 07/30/2022  END OF SESSION:  PT End of Session - 07/30/22 2207     Visit Number 1    Number of Visits 12    Date for PT Re-Evaluation 09/09/22    Authorization Type [employer]    PT Start Time 1405    PT Stop Time 1510    PT Time Calculation (min) 65 min    Activity Tolerance Patient tolerated treatment well    Behavior During Therapy [facility] for tasks assessed/performed             Past Medical History:  Diagnosis Date  . ADHD   . Anxiety   . Carpal tunnel syndrome on right   . Depression   . HTN (hypertension)   . Hypothyroidism   . Migraines   . Spondylosis   . Stroke [facility])    Past Surgical History:  Procedure Laterality Date  . ANTERIOR CRUCIATE LIGAMENT REPAIR Left 2021  . APPENDECTOMY    . BUBBLE STUDY  08/17/2020   Procedure: BUBBLE STUDY;  Surgeon: Jodelle Red, MD;  Location: [facility] ENDOSCOPY;  Service: Cardiovascular;;  . FALSE ANEURYSM REPAIR Right 08/15/2020   Procedure: REPAIR RIGHT FEMORAL PSEUDOANEURYSM;  Surgeon: Nada Libman, MD;  Location: [facility] OR;  Service: Vascular;  Laterality: Right;  . IR ANGIO INTRA EXTRACRAN SEL COM CAROTID INNOMINATE UNI L MOD SED  08/14/2020  . IR ANGIO INTRA EXTRACRAN SEL INTERNAL CAROTID UNI R MOD SED  08/14/2020  . IR ANGIO VERTEBRAL SEL SUBCLAVIAN INNOMINATE BILAT MOD SED  08/14/2020  . IR CT HEAD LTD  08/14/2020  . IR CT HEAD LTD  08/14/2020  . IR INTRA CRAN STENT  08/14/2020  . IR INTRAVSC STENT CERV CAROTID W/O EMB-PROT MOD SED INC ANGIO  08/14/2020  . IR RADIOLOGIST EVAL & MGMT  09/24/2020  . PATENT FORAMEN OVALE(PFO) CLOSURE N/A 10/24/2020   Procedure: PATENT FORAMEN OVALE (PFO) CLOSURE;  Surgeon: Tonny Bollman, MD;  Location: [facility] INVASIVE CV LAB;  Service: Cardiovascular;  Laterality: N/A;  . RADIOLOGY WITH ANESTHESIA N/A 08/14/2020   Procedure: IR WITH ANESTHESIA;  Surgeon:  Julieanne Cotton, MD;  Location: [facility] OR;  Service: Radiology;  Laterality: N/A;  . TEE WITHOUT CARDIOVERSION N/A 08/17/2020   Procedure: TRANSESOPHAGEAL ECHOCARDIOGRAM (TEE);  Surgeon: Jodelle Red, MD;  Location: [facility] ENDOSCOPY;  Service: Cardiovascular;  Laterality: N/A;  . TUBAL LIGATION     Patient Active Problem List   Diagnosis Date Noted  . Shoulder injury 06/30/2022  . Diabetes mellitus without complication (HCC) 02/09/2022  . Obesity (BMI 30.0-34.9) 11/12/2021  . Anxiety 04/10/2021  . Decreased hearing of left ear 04/10/2021  . Depression 01/02/2021  . Essential hypertension 01/02/2021  . Right middle cerebral artery stroke (HCC) 08/22/2020  . Hypothyroidism   . Hyperlipidemia   . PFO (patent foramen ovale)   . Internal carotid artery occlusion, right 08/14/2020  . Acute right MCA stroke (HCC) 08/12/2020  . Cerebrovascular accident (CVA) [facility])     PCP: [name], Buren Kos, MD   REFERRING PROVIDER: [name], [name], MD   REFERRING DIAG: S49.92XA (ICD-10-CM) - Injury of left shoulder, initial encounter   THERAPY DIAG:  Left shoulder pain, unspecified chronicity  Stiffness of left shoulder, not elsewhere classified  Muscle weakness (generalized)  Rationale for Evaluation and Treatment: Rehabilitation  ONSET DATE: 06/25/2022  SUBJECTIVE:  SUBJECTIVE STATEMENT: Pt was swinging trash bag to throw into the garbage on 6/19.  She had an immediate terrible pain and was unable to move her L UE.  Pt went to ED on 6/20.  Pt had x rays of shoulder and elbow which were negative for fx though did show some deg changes.  Pt had an IM injection of toradol and was prescribed oxycodone (percocet).  Pt reports improved sx's and was able to sleep after the toradol injection.  Pt hasn't taken any of the  oxycodone/percocet.  Pt was given a sling.  Pt tried the sling though stopped wearing it because it pulled on her neck.  Pt was advised to follow up with PCP and/or orthopedics.   Pt saw PCP on 6/24 and he ordered PT.  PT order (06/30/22) indicated 1-2x/wk x 4-6 weeks  Pt reports her shoulder is stiff and she is very limited with shoulder movement and reaching.  Pt has pain with L shoulder movement.  Pt unable to reach overhead.  She has difficulty with dressing and bathing.  Pt is unable to perform hair care with L UE.  Pt has difficulty with bed mobility and unable to push up from her bed with L UE.  Pt uses her R UE to perform activities.  Pt is limited with preparing food and cooking.  Pt is limited with household chores and has to perform all with R UE.  Pt unable to use L UE with driving.  Pt is not lifting objects with L UE.  She is unable to hold/lift dog.    Hand dominance: Right  PERTINENT HISTORY: CVA in 08/22.  Pt had surgery including a stent-Pt still has residual numbness in L foot Patent foramen ovale closure 10/24/2020 Anxiety and depression HTN, DM, and CTS on R L ACL repair 2021   PAIN:  Are you having pain? Yes NPRS:  6-7/10 current, 8/10 worst, 3-4/10 best Location:  L shoulder, clavicle, pec, post UE.  Pt can also have a sharp stinging pain in her L hand.  PRECAUTIONS: Other: CVA in 08/22   WEIGHT BEARING RESTRICTIONS: No  FALLS:  Has patient fallen in last 6 months? No  LIVING ENVIRONMENT: Lives with: lives with their spouse Lives in: 2 story home Stairs: yes  OCCUPATION: Pt not working.  Pt volunteers at church.    PLOF: Independent.  Pt was able to perform all of her ADLs/IADLs and reaching activities independently without difficulty.    PATIENT GOALS:improved mobility and strength.    OBJECTIVE:   DIAGNOSTIC FINDINGS:  Shoulder x rays on 6/20: FINDINGS: No evidence for an acute fracture. No findings to suggest shoulder separation or dislocation.  Degenerative changes are noted at the acromioclavicular joint. Cortical irregularity at the rotator cuff insertion of the humeral head is compatible with degenerative change.   IMPRESSION: Degenerative changes without acute bony findings.  Elbow x rays on 6/20: FINDINGS: No evidence for an acute fracture. No subluxation or dislocation. Degenerative spurring is seen on the coronoid process of the ulna. No fat pad elevation to suggest joint effusion.   IMPRESSION: Degenerative changes without acute bony findings.   No joint effusion.  PATIENT SURVEYS:  FOTO 28 with a goal of 57 at visit 15  COGNITION: Overall cognitive status: Within functional limits for tasks assessed      UPPER EXTREMITY ROM:   AROM/PROM Right eval Left eval  Shoulder flexion 176 65 with pain/126  Shoulder scaption 172 72 with pain  Shoulder abduction  145 64 with pain/86  Shoulder adduction    Shoulder internal rotation 65 70  Shoulder external rotation 74 55/62  Elbow flexion    Elbow extension    Wrist flexion    Wrist extension    Wrist ulnar deviation    Wrist radial deviation    Wrist pronation    Wrist supination    (Blank rows = not tested) Pt has a compensatory shoulder hike on L with UE elevation  UPPER EXTREMITY MMT:  MMT Right eval Left eval  Shoulder flexion    Shoulder extension    Shoulder abduction    Shoulder adduction    Shoulder internal rotation 4/5 4+/5  Shoulder external rotation 4/5 Unable to tolerate  Middle trapezius    Lower trapezius    Elbow flexion    Elbow extension    Wrist flexion    Wrist extension    Wrist ulnar deviation    Wrist radial deviation    Wrist pronation    Wrist supination    Grip strength (lbs)    (Blank rows = not tested)  SHOULDER SPECIAL TESTS:  ER Lag: R: negative, L: positive Neer's Impingement test:  R: negative, L:  Pt is guarded. She had elbow pain.  Pt is limited with ROM and had shoulder hike.     PALPATION:  TTP:   L clavicle, ant, lat, and post shoulder, triceps, and distal biceps.  Increased tenderness to palpation in posterior shoulder   TODAY'S TREATMENT:                                                                                                                                          Pt performed supine clasped hands flexion AAROM 2x5 reps, supine wand flexion x 2 reps, seated scap retraction x 10 reps, and seated table slides in flexion x 8 reps.  Pt received a HEP handout and was educated in correct form and appropriate frequency.  PT instructed pt to not perform into a painful range.  PT instructed pt she should not have pain with exercises and to stop exercises if she has pain.  PATIENT EDUCATION: Education details: objective findings, dx, HEP, restrictions/limitations, POC, and relevant anatomy.  PT answered pt's questions.  Person educated: Patient Education method: Explanation, Demonstration, Tactile cues, Verbal cues, and Handouts Education comprehension: verbalized understanding, returned demonstration, verbal cues required, and tactile cues required  HOME EXERCISE PROGRAM: Access Code: [email or web address] URL: [email or web address] Date: 07/29/2022 Prepared by: Aaron Edelman  Exercises - Supine Shoulder Flexion AAROM   - 2 x daily - 7 x weekly - 1-2 sets - 10 reps - Seated Scapular Retraction  - 2 x daily - 7 x weekly - 2 sets - 10 reps - Seated Shoulder Flexion Towel Slide at Table Top  - 2 x daily - 7 x weekly - 1 sets - 5 reps  ASSESSMENT:  CLINICAL IMPRESSION:  Patient is a 57 y.o. female with a dx of L shoulder injury presenting to the clinic with L shoulder pain, limited ROM in L shoulder, and muscle weakness in L UE.  Pt had x rays which showed degenerative changes though no fracture.  Pt has not had a MRI.  Pt received an IM injection and reports improved sx's.  Pt is significantly limited with UE elevation ROM and has a compensatory shoulder hike with attempting L UE  elevation.  Pt is very limited with reaching and is unable to reach overhead.  Pt has difficulty and pain with self care activities including dressing, hair care, and bathing.  Pt is limited with household chores and has to perform all with R UE.  Pt is limited with preparing food and cooking.  She is unable to use L UE with driving and is unable to lift objects with L UE.  Pt has a positive ER lag sign.  Pt may benefit from skilled PT services to address impairments and improve overall function.    OBJECTIVE IMPAIRMENTS: decreased activity tolerance, decreased ROM, decreased strength, hypomobility, impaired flexibility, impaired UE functional use, and pain.   ACTIVITY LIMITATIONS: carrying, lifting, bed mobility, bathing, dressing, reach over head, and hygiene/grooming  PARTICIPATION LIMITATIONS: meal prep, cleaning, laundry, shopping, and community activity  PERSONAL FACTORS: 1 comorbidity: CVA in 2022  are also affecting patient's functional outcome.   REHAB POTENTIAL: Good  CLINICAL DECISION MAKING: Stable/uncomplicated  EVALUATION COMPLEXITY: Low   GOALS:  SHORT TERM GOALS: Target date:  08/19/2022  Pt will report at least a 25% improvement in functional usage of L UE with reduced pain. Baseline: Goal status: INITIAL  2.  Pt will demo at least a 20-25 deg improvement in L shoulder flexion and scaption AROM for improved UE elevation and reaching. Baseline:  Goal status: INITIAL  3.  Pt will be independent and compliant with HEP for improved pain, ROM, strength, and function.  Baseline:  Goal status: INITIAL  4.  Pt will be able to use L UE with driving with increased ease. Baseline:  Goal status: INITIAL Target date:  08/26/2022    LONG TERM GOALS: Target date: 09/09/2022  Pt will have Baseline:  Goal status: INITIAL  2.  Pt will be able to perform hair care without significant pain and difficulty.  Baseline:  Goal status: INITIAL  3.  Pt will be able to reach  overhead into a cabinet. Baseline:  Goal status: INITIAL  4.  *** Baseline:  Goal status: INITIAL  5.  *** Baseline:  Goal status: INITIAL  6.  *** Baseline:  Goal status: INITIAL  PLAN:  PT FREQUENCY: 2x/week  PT DURATION: 6 weeks  PLANNED INTERVENTIONS: Therapeutic exercises, Therapeutic activity, Neuromuscular re-education, Patient/Family education, Self Care, Joint mobilization, Aquatic Therapy, Dry Needling, Electrical stimulation, Spinal mobilization, Cryotherapy, Moist heat, Taping, Ultrasound, Manual therapy, and Re-evaluation  PLAN FOR NEXT SESSION: ***   Aaron Edelman, PT 07/30/2022, 10:29 PM

## 2022-07-31 ENCOUNTER — Telehealth (HOSPITAL_COMMUNITY): Payer: Self-pay

## 2022-07-31 NOTE — Telephone Encounter (Signed)
Called to schedule US carotid, no answer, left vm. AB

## 2022-08-03 ENCOUNTER — Other Ambulatory Visit (HOSPITAL_BASED_OUTPATIENT_CLINIC_OR_DEPARTMENT_OTHER): Payer: Self-pay | Admitting: Family Medicine

## 2022-08-05 ENCOUNTER — Encounter (HOSPITAL_BASED_OUTPATIENT_CLINIC_OR_DEPARTMENT_OTHER): Payer: Self-pay

## 2022-08-05 ENCOUNTER — Ambulatory Visit (HOSPITAL_BASED_OUTPATIENT_CLINIC_OR_DEPARTMENT_OTHER): Payer: Managed Care, Other (non HMO)

## 2022-08-05 DIAGNOSIS — M25612 Stiffness of left shoulder, not elsewhere classified: Secondary | ICD-10-CM

## 2022-08-05 DIAGNOSIS — M25512 Pain in left shoulder: Secondary | ICD-10-CM

## 2022-08-05 NOTE — Therapy (Signed)
OUTPATIENT PHYSICAL THERAPY SHOULDER EVALUATION   Patient Name: Vicki Gonzales MRN: [id number] DOB:1965-03-09, 57 y.o., female Today's Date: 08/05/2022  END OF SESSION:  PT End of Session - 08/05/22 0746     Visit Number 2    Number of Visits 12    Date for PT Re-Evaluation 09/09/22    Authorization Type [employer]    PT Start Time 0800    PT Stop Time 0838    PT Time Calculation (min) 38 min    Activity Tolerance Patient tolerated treatment well    Behavior During Therapy WFL for tasks assessed/performed              Past Medical History:  Diagnosis Date   ADHD    Anxiety    Carpal tunnel syndrome on right    Depression    HTN (hypertension)    Hypothyroidism    Migraines    Spondylosis    Stroke [facility])    Past Surgical History:  Procedure Laterality Date   ANTERIOR CRUCIATE LIGAMENT REPAIR Left 2021   APPENDECTOMY     BUBBLE STUDY  08/17/2020   Procedure: BUBBLE STUDY;  Surgeon: Jodelle Red, MD;  Location: [facility] ENDOSCOPY;  Service: Cardiovascular;;   FALSE ANEURYSM REPAIR Right 08/15/2020   Procedure: REPAIR RIGHT FEMORAL PSEUDOANEURYSM;  Surgeon: Nada Libman, MD;  Location: [facility] OR;  Service: Vascular;  Laterality: Right;   IR ANGIO INTRA EXTRACRAN SEL COM CAROTID INNOMINATE UNI L MOD SED  08/14/2020   IR ANGIO INTRA EXTRACRAN SEL INTERNAL CAROTID UNI R MOD SED  08/14/2020   IR ANGIO VERTEBRAL SEL SUBCLAVIAN INNOMINATE BILAT MOD SED  08/14/2020   IR CT HEAD LTD  08/14/2020   IR CT HEAD LTD  08/14/2020   IR INTRA CRAN STENT  08/14/2020   IR INTRAVSC STENT CERV CAROTID W/O EMB-PROT MOD SED INC ANGIO  08/14/2020   IR RADIOLOGIST EVAL & MGMT  09/24/2020   PATENT FORAMEN OVALE(PFO) CLOSURE N/A 10/24/2020   Procedure: PATENT FORAMEN OVALE (PFO) CLOSURE;  Surgeon: Tonny Bollman, MD;  Location: [facility] INVASIVE CV LAB;  Service: Cardiovascular;  Laterality: N/A;   RADIOLOGY WITH ANESTHESIA N/A 08/14/2020   Procedure: IR WITH ANESTHESIA;  Surgeon: Julieanne Cotton, MD;   Location: [facility] OR;  Service: Radiology;  Laterality: N/A;   TEE WITHOUT CARDIOVERSION N/A 08/17/2020   Procedure: TRANSESOPHAGEAL ECHOCARDIOGRAM (TEE);  Surgeon: Jodelle Red, MD;  Location: [facility] ENDOSCOPY;  Service: Cardiovascular;  Laterality: N/A;   TUBAL LIGATION     Patient Active Problem List   Diagnosis Date Noted   Shoulder injury 06/30/2022   Diabetes mellitus without complication (HCC) 02/09/2022   Obesity (BMI 30.0-34.9) 11/12/2021   Anxiety 04/10/2021   Decreased hearing of left ear 04/10/2021   Depression 01/02/2021   Essential hypertension 01/02/2021   Right middle cerebral artery stroke (HCC) 08/22/2020   Hypothyroidism    Hyperlipidemia    PFO (patent foramen ovale)    Internal carotid artery occlusion, right 08/14/2020   Acute right MCA stroke (HCC) 08/12/2020   Cerebrovascular accident (CVA) (HCC)     PCP: [name], [name], MD   REFERRING PROVIDER: [name], [name], MD   REFERRING DIAG: S49.92XA (ICD-10-CM) - Injury of left shoulder, initial encounter   THERAPY DIAG:  Left shoulder pain, unspecified chronicity  Stiffness of left shoulder, not elsewhere classified  Rationale for Evaluation and Treatment: Rehabilitation  ONSET DATE: 06/25/2022  SUBJECTIVE:  SUBJECTIVE STATEMENT:  Pt reports HEP compliance and continues to have soreness/tightness. She denies pain at rest.   EVAL:  Pt was swinging trash bag to throw into the garbage on 6/19.  She had an immediate terrible pain and was unable to move her L UE.  Pt went to ED on 6/20.  Pt had x rays of shoulder and elbow which were negative for fx though did show some deg changes.  Pt had an IM injection of toradol and was prescribed oxycodone (percocet).  Pt reports improved sx's and was able to sleep after the toradol  injection.  Pt hasn't taken any of the oxycodone/percocet.  Pt was given a sling.  Pt tried the sling though stopped wearing it because it pulled on her neck.  Pt was advised to follow up with PCP and/or orthopedics.   Pt saw PCP on 6/24 and he ordered PT.  PT order (06/30/22) indicated 1-2x/wk x 4-6 weeks  Pt reports her shoulder is stiff and she is very limited with shoulder movement and reaching.  Pt has pain with L shoulder movement.  Pt unable to reach overhead.  She has difficulty with dressing and bathing.  Pt is unable to perform hair care with L UE.  Pt has difficulty with bed mobility and unable to push up from her bed with L UE.  Pt uses her R UE to perform activities.  Pt is limited with preparing food and cooking.  Pt is limited with household chores and has to perform all with R UE.  Pt unable to use L UE with driving.  Pt is not lifting objects with L UE.  She is unable to hold/lift dog.    Hand dominance: Right  PERTINENT HISTORY: CVA in 08/22.  Pt had surgery including a stent-Pt still has residual numbness in L foot Patent foramen ovale closure 10/24/2020 Anxiety and depression HTN, DM, and CTS on R L ACL repair 2021   PAIN:  Are you having pain? Yes NPRS:  6-7/10 current, 8/10 worst, 3-4/10 best Location:  L shoulder, clavicle, pec, post UE.  Pt can also have a sharp stinging pain in her L hand.  PRECAUTIONS: Other: CVA in 08/22   WEIGHT BEARING RESTRICTIONS: No  FALLS:  Has patient fallen in last 6 months? No  LIVING ENVIRONMENT: Lives with: lives with their spouse Lives in: 2 story home Stairs: yes  OCCUPATION: Pt not working.  Pt volunteers at church.    PLOF: Independent.  Pt was able to perform all of her ADLs/IADLs and reaching activities independently without difficulty.    PATIENT GOALS:improved mobility and strength.    OBJECTIVE:   DIAGNOSTIC FINDINGS:  Shoulder x rays on 6/20: FINDINGS: No evidence for an acute fracture. No findings to  suggest shoulder separation or dislocation. Degenerative changes are noted at the acromioclavicular joint. Cortical irregularity at the rotator cuff insertion of the humeral head is compatible with degenerative change.   IMPRESSION: Degenerative changes without acute bony findings.  Elbow x rays on 6/20: FINDINGS: No evidence for an acute fracture. No subluxation or dislocation. Degenerative spurring is seen on the coronoid process of the ulna. No fat pad elevation to suggest joint effusion.   IMPRESSION: Degenerative changes without acute bony findings.   No joint effusion.  PATIENT SURVEYS:  FOTO 28 with a goal of 57 at visit 15  COGNITION: Overall cognitive status: Within functional limits for tasks assessed      UPPER EXTREMITY ROM:   AROM/PROM Right eval  Left eval  Shoulder flexion 176 65 with pain/126  Shoulder scaption 172 72 with pain  Shoulder abduction 145 64 with pain/86  Shoulder adduction    Shoulder internal rotation 65 70  Shoulder external rotation 74 55/62  Elbow flexion    Elbow extension    Wrist flexion    Wrist extension    Wrist ulnar deviation    Wrist radial deviation    Wrist pronation    Wrist supination    (Blank rows = not tested) Pt has a compensatory shoulder hike on L with UE elevation  UPPER EXTREMITY MMT:  MMT Right eval Left eval  Shoulder flexion    Shoulder extension    Shoulder abduction    Shoulder adduction    Shoulder internal rotation 4/5 4+/5  Shoulder external rotation 4/5 Unable to tolerate  Middle trapezius    Lower trapezius    Elbow flexion    Elbow extension    Wrist flexion    Wrist extension    Wrist ulnar deviation    Wrist radial deviation    Wrist pronation    Wrist supination    Grip strength (lbs)    (Blank rows = not tested)  SHOULDER SPECIAL TESTS:  ER Lag: R: negative, L: positive Neer's Impingement test:  R: negative, L:  Pt is guarded. She had elbow pain.  Pt is limited with ROM and  had shoulder hike.     PALPATION:  TTP:  L clavicle, ant, lat, and post shoulder, triceps, and distal biceps.  Increased tenderness to palpation in posterior shoulder   TODAY'S TREATMENT:                                                                                                                                          PROM Manual pec stretch  STM to biceps, delts, lats, pecs Cane press ups to flexion supine x10 Scap retraction x15 Sidelying ER x8 with assist Ball rolls at table flexion x5 Doorway pec stretch 10s x4 Posterior shoulder rolls x10 Pendulums cw/ccw x15ea    Pt performed supine clasped hands flexion AAROM 2x5 reps, supine wand flexion x 2 reps, seated scap retraction x 10 reps, and seated table slides in flexion x 8 reps.  Pt received a HEP handout and was educated in correct form and appropriate frequency.  PT instructed pt to not perform into a painful range.  PT instructed pt she should not have pain with exercises and to stop exercises if she has pain.  PATIENT EDUCATION: Education details: objective findings, dx, HEP, restrictions/limitations, POC, and relevant anatomy.  PT answered pt's questions.  Person educated: Patient Education method: Explanation, Demonstration, Tactile cues, Verbal cues, and Handouts Education comprehension: verbalized understanding, returned demonstration, verbal cues required, and tactile cues required  HOME EXERCISE PROGRAM: Access Code: [email or web address] URL: [email or web address] Date: 07/29/2022 Prepared by: Aaron Edelman  Exercises - Supine Shoulder  Flexion AAROM   - 2 x daily - 7 x weekly - 1-2 sets - 10 reps - Seated Scapular Retraction  - 2 x daily - 7 x weekly - 2 sets - 10 reps - Seated Shoulder Flexion Towel Slide at Table Top  - 2 x daily - 7 x weekly - 1 sets - 5 reps  ASSESSMENT:  CLINICAL IMPRESSION: Pt very tender throughout delts, bicep, pecs, and lats. Spent time on STM as well as PROM to help improve  available ROM. Pt reported improvement following STM. Improved ability with cane flexion today.  Educated pt about DOMS and instructed in use of heat/ice prn. Will continue to progress as tolerated towards goals. Will assess response to today's session before updating HEP.   OBJECTIVE IMPAIRMENTS: decreased activity tolerance, decreased ROM, decreased strength, hypomobility, impaired flexibility, impaired UE functional use, and pain.   ACTIVITY LIMITATIONS: carrying, lifting, bed mobility, bathing, dressing, reach over head, and hygiene/grooming  PARTICIPATION LIMITATIONS: meal prep, cleaning, laundry, shopping, and community activity  PERSONAL FACTORS: 1 comorbidity: CVA in 2022  are also affecting patient's functional outcome.   REHAB POTENTIAL: Good  CLINICAL DECISION MAKING: Stable/uncomplicated  EVALUATION COMPLEXITY: Low   GOALS:  SHORT TERM GOALS: Target date:  08/19/2022  Pt will report at least a 25% improvement in functional usage of L UE with reduced pain. Baseline: Goal status: INITIAL  2.  Pt will demo at least a 20-25 deg improvement in L shoulder flexion and scaption AROM for improved UE elevation and reaching. Baseline:  Goal status: INITIAL  3.  Pt will be independent and compliant with HEP for improved pain, ROM, strength, and function.  Baseline:  Goal status: INITIAL  4.  Pt will be able to use L UE with driving with increased ease. Baseline:  Goal status: INITIAL Target date:  08/26/2022    LONG TERM GOALS: Target date: 09/09/2022  Pt will demo improved flexion and scaption AROM to at least 120 deg with significantly improved compensatory shoulder hike for improved reaching and performance of overhead activities. Baseline:  Goal status: INITIAL  2.  Pt will be able to perform hair care without significant pain and difficulty.  Baseline:  Goal status: INITIAL  3.  Pt will be able to reach overhead into a cabinet. Baseline:  Goal status:  INITIAL  4.  Pt will report she is able to perform bathing and dressing without significant difficulty.  Baseline:  Goal status: INITIAL  5.  Pt will report improved usage of L UE with household chores with reduced pain.  Baseline:  Goal status: INITIAL   PLAN:  PT FREQUENCY: 2x/week  PT DURATION: 6 weeks  PLANNED INTERVENTIONS: Therapeutic exercises, Therapeutic activity, Neuromuscular re-education, Patient/Family education, Self Care, Joint mobilization, Aquatic Therapy, Dry Needling, Electrical stimulation, Spinal mobilization, Cryotherapy, Moist heat, Taping, Ultrasound, Manual therapy, and Re-evaluation  PLAN FOR NEXT SESSION: review and perform HEP.  Progress shoulder ROM.  Try pulleys next visit.     Riki Altes, PTA  08/05/22 8:42 AM

## 2022-08-07 ENCOUNTER — Ambulatory Visit (HOSPITAL_BASED_OUTPATIENT_CLINIC_OR_DEPARTMENT_OTHER): Payer: 59 | Admitting: Family Medicine

## 2022-08-07 ENCOUNTER — Ambulatory Visit (HOSPITAL_COMMUNITY)
Admission: RE | Admit: 2022-08-07 | Discharge: 2022-08-07 | Disposition: A | Discharge status: Home / Self Care | Payer: Managed Care, Other (non HMO) | Source: Ambulatory Visit | Attending: Interventional Radiology | Admitting: Interventional Radiology

## 2022-08-07 ENCOUNTER — Encounter (HOSPITAL_BASED_OUTPATIENT_CLINIC_OR_DEPARTMENT_OTHER): Payer: Self-pay | Admitting: Family Medicine

## 2022-08-07 ENCOUNTER — Other Ambulatory Visit (HOSPITAL_BASED_OUTPATIENT_CLINIC_OR_DEPARTMENT_OTHER): Payer: Self-pay | Admitting: Family Medicine

## 2022-08-07 DIAGNOSIS — I6521 Occlusion and stenosis of right carotid artery: Secondary | ICD-10-CM | POA: Insufficient documentation

## 2022-08-07 DIAGNOSIS — E785 Hyperlipidemia, unspecified: Secondary | ICD-10-CM

## 2022-08-07 MED ORDER — ROSUVASTATIN CALCIUM 20 MG PO TABS: 20.0000 mg | ORAL_TABLET | Freq: Every day | ORAL | 0 refills | Status: DC

## 2022-08-07 NOTE — Progress Notes (Signed)
VASCULAR LAB    Carotid duplex has been performed.  See CV proc for preliminary results.   Clariece Roesler, RVT 08/07/2022, 10:14 AM

## 2022-08-08 ENCOUNTER — Encounter (HOSPITAL_BASED_OUTPATIENT_CLINIC_OR_DEPARTMENT_OTHER): Payer: Self-pay | Admitting: Physical Therapy

## 2022-08-08 ENCOUNTER — Ambulatory Visit (HOSPITAL_BASED_OUTPATIENT_CLINIC_OR_DEPARTMENT_OTHER): Payer: Managed Care, Other (non HMO) | Attending: Family Medicine | Admitting: Physical Therapy

## 2022-08-08 DIAGNOSIS — M6281 Muscle weakness (generalized): Secondary | ICD-10-CM | POA: Diagnosis present

## 2022-08-08 DIAGNOSIS — M25512 Pain in left shoulder: Secondary | ICD-10-CM | POA: Insufficient documentation

## 2022-08-08 DIAGNOSIS — M25612 Stiffness of left shoulder, not elsewhere classified: Secondary | ICD-10-CM | POA: Insufficient documentation

## 2022-08-08 NOTE — Therapy (Signed)
OUTPATIENT PHYSICAL THERAPY TREATMENT   Patient Name: Vicki Gonzales MRN: [id number] DOB:September 03, 1965, 57 y.o., female Today's Date: 08/08/2022  END OF SESSION:  PT End of Session - 08/08/22 1054     Visit Number 3    Number of Visits 12    Date for PT Re-Evaluation 09/09/22    Authorization Type [employer]    PT Start Time 1022    PT Stop Time 1104    PT Time Calculation (min) 42 min    Activity Tolerance Patient tolerated treatment well    Behavior During Therapy WFL for tasks assessed/performed               Past Medical History:  Diagnosis Date   ADHD    Anxiety    Carpal tunnel syndrome on right    Depression    HTN (hypertension)    Hypothyroidism    Migraines    Spondylosis    Stroke [facility])    Past Surgical History:  Procedure Laterality Date   ANTERIOR CRUCIATE LIGAMENT REPAIR Left 2021   APPENDECTOMY     BUBBLE STUDY  08/17/2020   Procedure: BUBBLE STUDY;  Surgeon: Jodelle Red, MD;  Location: [facility] ENDOSCOPY;  Service: Cardiovascular;;   FALSE ANEURYSM REPAIR Right 08/15/2020   Procedure: REPAIR RIGHT FEMORAL PSEUDOANEURYSM;  Surgeon: Nada Libman, MD;  Location: [facility] OR;  Service: Vascular;  Laterality: Right;   IR ANGIO INTRA EXTRACRAN SEL COM CAROTID INNOMINATE UNI L MOD SED  08/14/2020   IR ANGIO INTRA EXTRACRAN SEL INTERNAL CAROTID UNI R MOD SED  08/14/2020   IR ANGIO VERTEBRAL SEL SUBCLAVIAN INNOMINATE BILAT MOD SED  08/14/2020   IR CT HEAD LTD  08/14/2020   IR CT HEAD LTD  08/14/2020   IR INTRA CRAN STENT  08/14/2020   IR INTRAVSC STENT CERV CAROTID W/O EMB-PROT MOD SED INC ANGIO  08/14/2020   IR RADIOLOGIST EVAL & MGMT  09/24/2020   PATENT FORAMEN OVALE(PFO) CLOSURE N/A 10/24/2020   Procedure: PATENT FORAMEN OVALE (PFO) CLOSURE;  Surgeon: Tonny Bollman, MD;  Location: [facility] INVASIVE CV LAB;  Service: Cardiovascular;  Laterality: N/A;   RADIOLOGY WITH ANESTHESIA N/A 08/14/2020   Procedure: IR WITH ANESTHESIA;  Surgeon: Julieanne Cotton, MD;   Location: [facility] OR;  Service: Radiology;  Laterality: N/A;   TEE WITHOUT CARDIOVERSION N/A 08/17/2020   Procedure: TRANSESOPHAGEAL ECHOCARDIOGRAM (TEE);  Surgeon: Jodelle Red, MD;  Location: [facility] ENDOSCOPY;  Service: Cardiovascular;  Laterality: N/A;   TUBAL LIGATION     Patient Active Problem List   Diagnosis Date Noted   Shoulder injury 06/30/2022   Diabetes mellitus without complication (HCC) 02/09/2022   Obesity (BMI 30.0-34.9) 11/12/2021   Anxiety 04/10/2021   Decreased hearing of left ear 04/10/2021   Depression 01/02/2021   Essential hypertension 01/02/2021   Right middle cerebral artery stroke (HCC) 08/22/2020   Hypothyroidism    Hyperlipidemia    PFO (patent foramen ovale)    Internal carotid artery occlusion, right 08/14/2020   Acute right MCA stroke (HCC) 08/12/2020   Cerebrovascular accident (CVA) (HCC)     PCP: [name], [name], MD   REFERRING PROVIDER: [name], [name], MD   REFERRING DIAG: S49.92XA (ICD-10-CM) - Injury of left shoulder, initial encounter   THERAPY DIAG:  Left shoulder pain, unspecified chronicity  Stiffness of left shoulder, not elsewhere classified  Muscle weakness (generalized)  Rationale for Evaluation and Treatment: Rehabilitation  ONSET DATE: 06/25/2022  SUBJECTIVE:  SUBJECTIVE STATEMENT:  Pt states had a good session of PT earlier this week.  Pt states PT is helping.  She states she is able to move her arm a little better.  Pt reports she is able to hold a water bottle while lifting it with help from her R hand.  Pt reports she has been performing her HEP off and on.  She has been using her arm to try to wash dishes.    Hand dominance: Right  PERTINENT HISTORY: CVA in 08/22.  Pt had surgery including a stent-Pt still has residual numbness in L  foot Patent foramen ovale closure 10/24/2020 Anxiety and depression HTN, DM, and CTS on R L ACL repair 2021   PAIN:  Are you having pain? Yes NPRS:  3/10 current, 8/10 worst, 3-4/10 best Location:  L superior/lateral/post shoulder  PRECAUTIONS: Other: CVA in 08/22   WEIGHT BEARING RESTRICTIONS: No  FALLS:  Has patient fallen in last 6 months? No  LIVING ENVIRONMENT: Lives with: lives with their spouse Lives in: 2 story home Stairs: yes  OCCUPATION: Pt not working.  Pt volunteers at church.    PLOF: Independent.  Pt was able to perform all of her ADLs/IADLs and reaching activities independently without difficulty.    PATIENT GOALS:improved mobility and strength.    OBJECTIVE:   DIAGNOSTIC FINDINGS:  Shoulder x rays on 6/20: FINDINGS: No evidence for an acute fracture. No findings to suggest shoulder separation or dislocation. Degenerative changes are noted at the acromioclavicular joint. Cortical irregularity at the rotator cuff insertion of the humeral head is compatible with degenerative change.   IMPRESSION: Degenerative changes without acute bony findings.  Elbow x rays on 6/20: FINDINGS: No evidence for an acute fracture. No subluxation or dislocation. Degenerative spurring is seen on the coronoid process of the ulna. No fat pad elevation to suggest joint effusion.   IMPRESSION: Degenerative changes without acute bony findings.   No joint effusion.   TODAY'S TREATMENT:                                                                                                                                          Manual Therapy:   Pt received STM to biceps, delts, lats in supine  Therapeutic Exercise: Pt received L shoulder PROM in flexion, scaption, and abd in supine per tissue and pt tolerance.  Cane press ups to flexion supine 2x10 Supine clasped hands flexion AAROM x10 and x 5 reps Scap retraction x10 with 3 sec hold Ball rolls at table flexion  x10 Pendulums cw/ccw x15ea submax shoulder isometrics into therapist's hand with elbow flexed:  flex x 5 reps with 5 sec hold and abd, ER, and ext 3-5 reps each for approx 2-5 sec hold   PATIENT EDUCATION: Education details:  dx, exercise form and rationale, HEP, POC, and relevant anatomy.  PT answered pt's questions.  Person educated: Patient  Education method: Explanation, Demonstration, Tactile cues, Verbal cues, and Handouts Education comprehension: verbalized understanding, returned demonstration, verbal cues required, and tactile cues required  HOME EXERCISE PROGRAM: Access Code: [email or web address] URL: [email or web address] Date: 07/29/2022 Prepared by: Aaron Edelman  Exercises - Supine Shoulder Flexion AAROM   - 2 x daily - 7 x weekly - 1-2 sets - 10 reps - Seated Scapular Retraction  - 2 x daily - 7 x weekly - 2 sets - 10 reps - Seated Shoulder Flexion Towel Slide at Table Top  - 2 x daily - 7 x weekly - 1 sets - 5 reps  ASSESSMENT:  CLINICAL IMPRESSION: Pt responded well to prior Rx and reports some improvement.  Pt is guarded with L shoulder PROM and required cues to relax L shoulder.  PT performed PROM w/n pt tolerance.  She is more limited with abd PROM and had some pain with abd.  Pt had pain and difficulty with supine wand flexion and PT had pt stop that exercise.  She was able to perform supine clasped hands flexion AAROM and reports fatigue with exercise.  Pt has some tenderness in lats and tolerated STM well.  Pt performed exercises well with cuing and instruction in correct form.  She responded well to Rx stating her pain was a little lower after Rx.  She reports pain improved from 3/10 before Rx to 2/10 after Rx.     OBJECTIVE IMPAIRMENTS: decreased activity tolerance, decreased ROM, decreased strength, hypomobility, impaired flexibility, impaired UE functional use, and pain.   ACTIVITY LIMITATIONS: carrying, lifting, bed mobility, bathing, dressing, reach over  head, and hygiene/grooming  PARTICIPATION LIMITATIONS: meal prep, cleaning, laundry, shopping, and community activity  PERSONAL FACTORS: 1 comorbidity: CVA in 2022  are also affecting patient's functional outcome.   REHAB POTENTIAL: Good  CLINICAL DECISION MAKING: Stable/uncomplicated  EVALUATION COMPLEXITY: Low   GOALS:  SHORT TERM GOALS: Target date:  08/19/2022  Pt will report at least a 25% improvement in functional usage of L UE with reduced pain. Baseline: Goal status: INITIAL  2.  Pt will demo at least a 20-25 deg improvement in L shoulder flexion and scaption AROM for improved UE elevation and reaching. Baseline:  Goal status: INITIAL  3.  Pt will be independent and compliant with HEP for improved pain, ROM, strength, and function.  Baseline:  Goal status: INITIAL  4.  Pt will be able to use L UE with driving with increased ease. Baseline:  Goal status: INITIAL Target date:  08/26/2022    LONG TERM GOALS: Target date: 09/09/2022  Pt will demo improved flexion and scaption AROM to at least 120 deg with significantly improved compensatory shoulder hike for improved reaching and performance of overhead activities. Baseline:  Goal status: INITIAL  2.  Pt will be able to perform hair care without significant pain and difficulty.  Baseline:  Goal status: INITIAL  3.  Pt will be able to reach overhead into a cabinet. Baseline:  Goal status: INITIAL  4.  Pt will report she is able to perform bathing and dressing without significant difficulty.  Baseline:  Goal status: INITIAL  5.  Pt will report improved usage of L UE with household chores with reduced pain.  Baseline:  Goal status: INITIAL   PLAN:  PT FREQUENCY: 2x/week  PT DURATION: 6 weeks  PLANNED INTERVENTIONS: Therapeutic exercises, Therapeutic activity, Neuromuscular re-education, Patient/Family education, Self Care, Joint mobilization, Aquatic Therapy, Dry Needling, Electrical stimulation, Spinal  mobilization, Cryotherapy, Moist heat, Taping, Ultrasound, Manual therapy,  and Re-evaluation  PLAN FOR NEXT SESSION: review and perform HEP.  Progress shoulder ROM.  Try pulleys next visit.     Riki Altes, PTA  08/08/22 1:39 PM

## 2022-08-11 ENCOUNTER — Telehealth (HOSPITAL_COMMUNITY): Payer: Self-pay

## 2022-08-11 NOTE — Telephone Encounter (Signed)
Pt agreed to f/u in 6 months with a us carotid. AB  

## 2022-08-12 ENCOUNTER — Ambulatory Visit (HOSPITAL_BASED_OUTPATIENT_CLINIC_OR_DEPARTMENT_OTHER): Payer: Managed Care, Other (non HMO) | Admitting: Physical Therapy

## 2022-08-12 DIAGNOSIS — M25612 Stiffness of left shoulder, not elsewhere classified: Secondary | ICD-10-CM

## 2022-08-12 DIAGNOSIS — M6281 Muscle weakness (generalized): Secondary | ICD-10-CM

## 2022-08-12 DIAGNOSIS — M25512 Pain in left shoulder: Secondary | ICD-10-CM | POA: Diagnosis not present

## 2022-08-12 NOTE — Therapy (Signed)
OUTPATIENT PHYSICAL THERAPY TREATMENT   Patient Name: Vicki Gonzales MRN: [id number] DOB:1965/05/05, 57 y.o., female Today's Date: 08/13/2022  END OF SESSION:  PT End of Session - 08/12/22 0948     Visit Number 4    Number of Visits 12    Date for PT Re-Evaluation 09/09/22    Authorization Type [employer]    PT Start Time 0940    PT Stop Time 1021    PT Time Calculation (min) 41 min    Activity Tolerance Patient tolerated treatment well    Behavior During Therapy WFL for tasks assessed/performed               Past Medical History:  Diagnosis Date   ADHD    Anxiety    Carpal tunnel syndrome on right    Depression    HTN (hypertension)    Hypothyroidism    Migraines    Spondylosis    Stroke [facility])    Past Surgical History:  Procedure Laterality Date   ANTERIOR CRUCIATE LIGAMENT REPAIR Left 2021   APPENDECTOMY     BUBBLE STUDY  08/17/2020   Procedure: BUBBLE STUDY;  Surgeon: Jodelle Red, MD;  Location: [facility] ENDOSCOPY;  Service: Cardiovascular;;   FALSE ANEURYSM REPAIR Right 08/15/2020   Procedure: REPAIR RIGHT FEMORAL PSEUDOANEURYSM;  Surgeon: Nada Libman, MD;  Location: [facility] OR;  Service: Vascular;  Laterality: Right;   IR ANGIO INTRA EXTRACRAN SEL COM CAROTID INNOMINATE UNI L MOD SED  08/14/2020   IR ANGIO INTRA EXTRACRAN SEL INTERNAL CAROTID UNI R MOD SED  08/14/2020   IR ANGIO VERTEBRAL SEL SUBCLAVIAN INNOMINATE BILAT MOD SED  08/14/2020   IR CT HEAD LTD  08/14/2020   IR CT HEAD LTD  08/14/2020   IR INTRA CRAN STENT  08/14/2020   IR INTRAVSC STENT CERV CAROTID W/O EMB-PROT MOD SED INC ANGIO  08/14/2020   IR RADIOLOGIST EVAL & MGMT  09/24/2020   PATENT FORAMEN OVALE(PFO) CLOSURE N/A 10/24/2020   Procedure: PATENT FORAMEN OVALE (PFO) CLOSURE;  Surgeon: Tonny Bollman, MD;  Location: [facility] INVASIVE CV LAB;  Service: Cardiovascular;  Laterality: N/A;   RADIOLOGY WITH ANESTHESIA N/A 08/14/2020   Procedure: IR WITH ANESTHESIA;  Surgeon: Julieanne Cotton, MD;   Location: [facility] OR;  Service: Radiology;  Laterality: N/A;   TEE WITHOUT CARDIOVERSION N/A 08/17/2020   Procedure: TRANSESOPHAGEAL ECHOCARDIOGRAM (TEE);  Surgeon: Jodelle Red, MD;  Location: [facility] ENDOSCOPY;  Service: Cardiovascular;  Laterality: N/A;   TUBAL LIGATION     Patient Active Problem List   Diagnosis Date Noted   Shoulder injury 06/30/2022   Diabetes mellitus without complication (HCC) 02/09/2022   Obesity (BMI 30.0-34.9) 11/12/2021   Anxiety 04/10/2021   Decreased hearing of left ear 04/10/2021   Depression 01/02/2021   Essential hypertension 01/02/2021   Right middle cerebral artery stroke (HCC) 08/22/2020   Hypothyroidism    Hyperlipidemia    PFO (patent foramen ovale)    Internal carotid artery occlusion, right 08/14/2020   Acute right MCA stroke (HCC) 08/12/2020   Cerebrovascular accident (CVA) (HCC)     PCP: [name], [name], MD   REFERRING PROVIDER: [name], [name], MD   REFERRING DIAG: S49.92XA (ICD-10-CM) - Injury of left shoulder, initial encounter   THERAPY DIAG:  Left shoulder pain, unspecified chronicity  Stiffness of left shoulder, not elsewhere classified  Muscle weakness (generalized)  Rationale for Evaluation and Treatment: Rehabilitation  ONSET DATE: 06/25/2022  SUBJECTIVE:  SUBJECTIVE STATEMENT:  Pt states she felt ok after prior Rx and had no adverse effects.  Pt states she is getting more movement in her shoulder.  Pt reports compliance with HEP.    Hand dominance: Right  PERTINENT HISTORY: CVA in 08/22.  Pt had surgery including a stent-Pt still has residual numbness in L foot Patent foramen ovale closure 10/24/2020 Anxiety and depression HTN, DM, and CTS on R L ACL repair 2021   PAIN:  Are you having pain? Yes NPRS:  2/10 current, 8/10  worst, 3-4/10 best Location:  L superior/lateral/post shoulder, medial biceps and distal L UE  PRECAUTIONS: Other: CVA in 08/22   WEIGHT BEARING RESTRICTIONS: No  FALLS:  Has patient fallen in last 6 months? No  LIVING ENVIRONMENT: Lives with: lives with their spouse Lives in: 2 story home Stairs: yes  OCCUPATION: Pt not working.  Pt volunteers at church.    PLOF: Independent.  Pt was able to perform all of her ADLs/IADLs and reaching activities independently without difficulty.    PATIENT GOALS:improved mobility and strength.    OBJECTIVE:   DIAGNOSTIC FINDINGS:  Shoulder x rays on 6/20: FINDINGS: No evidence for an acute fracture. No findings to suggest shoulder separation or dislocation. Degenerative changes are noted at the acromioclavicular joint. Cortical irregularity at the rotator cuff insertion of the humeral head is compatible with degenerative change.   IMPRESSION: Degenerative changes without acute bony findings.  Elbow x rays on 6/20: FINDINGS: No evidence for an acute fracture. No subluxation or dislocation. Degenerative spurring is seen on the coronoid process of the ulna. No fat pad elevation to suggest joint effusion.   IMPRESSION: Degenerative changes without acute bony findings.   No joint effusion.   TODAY'S TREATMENT:                                                                                                                                          Manual Therapy:   Pt received STM to biceps, delts, lats in supine  Therapeutic Exercise: Pt received L shoulder PROM in flexion, scaption, and abd in supine per tissue and pt tolerance.  Pulleys in flexion approx 20 reps and 10 reps Cane press ups to flexion supine 2x10 Supine clasped hands flexion AAROM x10  Scap retraction x10 with 3 sec hold Ball rolls at table flexion approx 12 reps submax shoulder isometrics into therapist's hand with elbow flexed:  flex, abd, and ext x 5 reps  with 5 sec hold    PATIENT EDUCATION: Education details:  dx, exercise form and rationale, HEP, POC, and relevant anatomy.  PT answered pt's questions.  Person educated: Patient Education method: Explanation, Demonstration, Tactile cues, Verbal cues, and Handouts Education comprehension: verbalized understanding, returned demonstration, verbal cues required, and tactile cues required  HOME EXERCISE PROGRAM: Access Code: [email or web address] URL: [email or web address] Date: 07/29/2022 Prepared by: Aaron Edelman  Exercises - Supine  Shoulder Flexion AAROM   - 2 x daily - 7 x weekly - 1-2 sets - 10 reps - Seated Scapular Retraction  - 2 x daily - 7 x weekly - 2 sets - 10 reps - Seated Shoulder Flexion Towel Slide at Table Top  - 2 x daily - 7 x weekly - 1 sets - 5 reps  ASSESSMENT:  CLINICAL IMPRESSION: Pt reports improved mobility in shoulder.  Pt was guarded and tight with PROM and PT performed PROM w/n pt tolerance.  She continues to be limited with shoulder ROM.  Pt is improving with tolerance for exercises including tolerating abd and ext submax isometrics well.  Pt was unable to perform scaption with pulleys due to pain.  Pt responded well to Rx having no increased pain after Rx.       OBJECTIVE IMPAIRMENTS: decreased activity tolerance, decreased ROM, decreased strength, hypomobility, impaired flexibility, impaired UE functional use, and pain.   ACTIVITY LIMITATIONS: carrying, lifting, bed mobility, bathing, dressing, reach over head, and hygiene/grooming  PARTICIPATION LIMITATIONS: meal prep, cleaning, laundry, shopping, and community activity  PERSONAL FACTORS: 1 comorbidity: CVA in 2022  are also affecting patient's functional outcome.   REHAB POTENTIAL: Good  CLINICAL DECISION MAKING: Stable/uncomplicated  EVALUATION COMPLEXITY: Low   GOALS:  SHORT TERM GOALS: Target date:  08/19/2022  Pt will report at least a 25% improvement in functional usage of L UE with  reduced pain. Baseline: Goal status: INITIAL  2.  Pt will demo at least a 20-25 deg improvement in L shoulder flexion and scaption AROM for improved UE elevation and reaching. Baseline:  Goal status: INITIAL  3.  Pt will be independent and compliant with HEP for improved pain, ROM, strength, and function.  Baseline:  Goal status: INITIAL  4.  Pt will be able to use L UE with driving with increased ease. Baseline:  Goal status: INITIAL Target date:  08/26/2022    LONG TERM GOALS: Target date: 09/09/2022  Pt will demo improved flexion and scaption AROM to at least 120 deg with significantly improved compensatory shoulder hike for improved reaching and performance of overhead activities. Baseline:  Goal status: INITIAL  2.  Pt will be able to perform hair care without significant pain and difficulty.  Baseline:  Goal status: INITIAL  3.  Pt will be able to reach overhead into a cabinet. Baseline:  Goal status: INITIAL  4.  Pt will report she is able to perform bathing and dressing without significant difficulty.  Baseline:  Goal status: INITIAL  5.  Pt will report improved usage of L UE with household chores with reduced pain.  Baseline:  Goal status: INITIAL   PLAN:  PT FREQUENCY: 2x/week  PT DURATION: 6 weeks  PLANNED INTERVENTIONS: Therapeutic exercises, Therapeutic activity, Neuromuscular re-education, Patient/Family education, Self Care, Joint mobilization, Aquatic Therapy, Dry Needling, Electrical stimulation, Spinal mobilization, Cryotherapy, Moist heat, Taping, Ultrasound, Manual therapy, and Re-evaluation  PLAN FOR NEXT SESSION: review and perform HEP.  Progress shoulder ROM.       Audie Clear III PT, DPT 08/13/22 8:59 AM

## 2022-08-13 ENCOUNTER — Encounter (HOSPITAL_BASED_OUTPATIENT_CLINIC_OR_DEPARTMENT_OTHER): Payer: Self-pay | Admitting: Physical Therapy

## 2022-08-13 NOTE — Assessment & Plan Note (Signed)
Few days ago, patient was seen in the emergency department due to left shoulder pain.  She indicates that she was taking the trash when she swung the bag of trash into a dumpster and felt significant pain in her left shoulder at the time.  Pain has been significant enough that she has not been able to move her arm as well.  In the emergency department, he did complete x-rays which were generally unremarkable, no evidence of acute bony abnormality, no fracture observed.  She was placed in sling while in the emergency department.  She was also prescribed Percocet to help with pain control. On exam, patient with some limitations in regards to active and passive range of motion related to underlying pain.  Does have reduced strength in left shoulder as compared to right.  Normal distal grip strength, normal sensation distally, normal radial pulse. Reviewed prior imaging, Emergency Department documentation.  Discussed general considerations with patient today.  Would be reasonable to proceed conservatively, can look to initiate physical therapy and we will plan for close follow-up to monitor progress with this.  If symptoms not improving as expected, low threshold to proceed with MRI for further evaluation of underlying structures including rotator cuff. Will plan for follow-up in about 2 to 3 months to monitor progress or sooner as needed, particularly if any worsening in symptoms does occur

## 2022-08-13 NOTE — Assessment & Plan Note (Signed)
Hemoglobin A1c was 6.5% earlier this year, most recent last week was 6.4%.  Does remain stable.  No reported issues with polyuria or polydipsia.  We did discuss medication consideration, reviewed option of metformin to help with controlling blood sugars.  Reviewed potential risk with medication. After discussion, patient is amenable to starting metformin to help with controlling blood sugars.  Instructed on proper use of medication, potential side effects to be aware of We will complete urine microalbumin/creatinine ratio today Plan to complete foot exam at future office visit

## 2022-08-13 NOTE — Assessment & Plan Note (Signed)
Patient continues with levothyroxine 75 mcg daily.  Last TSH was about 1 week ago and this was slightly above normal range.  Foye Clock recommended increase in dose to 100 mcg dose of levothyroxine which is reasonable..  Today, clinically patient is euthyroid.  Can continue with dose adjustment of levothyroxine Will plan for rechecking TSH in about 6 weeks to assess response to new dose of levothyroxine

## 2022-08-15 ENCOUNTER — Ambulatory Visit (HOSPITAL_BASED_OUTPATIENT_CLINIC_OR_DEPARTMENT_OTHER): Payer: Managed Care, Other (non HMO)

## 2022-08-15 ENCOUNTER — Encounter (HOSPITAL_BASED_OUTPATIENT_CLINIC_OR_DEPARTMENT_OTHER): Payer: Self-pay

## 2022-08-15 DIAGNOSIS — M25612 Stiffness of left shoulder, not elsewhere classified: Secondary | ICD-10-CM

## 2022-08-15 DIAGNOSIS — M25512 Pain in left shoulder: Secondary | ICD-10-CM | POA: Diagnosis not present

## 2022-08-15 DIAGNOSIS — M6281 Muscle weakness (generalized): Secondary | ICD-10-CM

## 2022-08-15 NOTE — Therapy (Signed)
OUTPATIENT PHYSICAL THERAPY TREATMENT   Patient Name: Vicki Gonzales MRN: [id number] DOB:Nov 15, 1965, 57 y.o., female Today's Date: 08/15/2022  END OF SESSION:  PT End of Session - 08/15/22 1140     Visit Number 5    Number of Visits 12    Date for PT Re-Evaluation 09/09/22    Authorization Type [employer]    PT Start Time 1145    PT Stop Time 1230    PT Time Calculation (min) 45 min    Activity Tolerance Patient tolerated treatment well    Behavior During Therapy WFL for tasks assessed/performed                Past Medical History:  Diagnosis Date   ADHD    Anxiety    Carpal tunnel syndrome on right    Depression    HTN (hypertension)    Hypothyroidism    Migraines    Spondylosis    Stroke [facility])    Past Surgical History:  Procedure Laterality Date   ANTERIOR CRUCIATE LIGAMENT REPAIR Left 2021   APPENDECTOMY     BUBBLE STUDY  08/17/2020   Procedure: BUBBLE STUDY;  Surgeon: Jodelle Red, MD;  Location: [facility] ENDOSCOPY;  Service: Cardiovascular;;   FALSE ANEURYSM REPAIR Right 08/15/2020   Procedure: REPAIR RIGHT FEMORAL PSEUDOANEURYSM;  Surgeon: Nada Libman, MD;  Location: [facility] OR;  Service: Vascular;  Laterality: Right;   IR ANGIO INTRA EXTRACRAN SEL COM CAROTID INNOMINATE UNI L MOD SED  08/14/2020   IR ANGIO INTRA EXTRACRAN SEL INTERNAL CAROTID UNI R MOD SED  08/14/2020   IR ANGIO VERTEBRAL SEL SUBCLAVIAN INNOMINATE BILAT MOD SED  08/14/2020   IR CT HEAD LTD  08/14/2020   IR CT HEAD LTD  08/14/2020   IR INTRA CRAN STENT  08/14/2020   IR INTRAVSC STENT CERV CAROTID W/O EMB-PROT MOD SED INC ANGIO  08/14/2020   IR RADIOLOGIST EVAL & MGMT  09/24/2020   PATENT FORAMEN OVALE(PFO) CLOSURE N/A 10/24/2020   Procedure: PATENT FORAMEN OVALE (PFO) CLOSURE;  Surgeon: Tonny Bollman, MD;  Location: [facility] INVASIVE CV LAB;  Service: Cardiovascular;  Laterality: N/A;   RADIOLOGY WITH ANESTHESIA N/A 08/14/2020   Procedure: IR WITH ANESTHESIA;  Surgeon: Julieanne Cotton, MD;   Location: [facility] OR;  Service: Radiology;  Laterality: N/A;   TEE WITHOUT CARDIOVERSION N/A 08/17/2020   Procedure: TRANSESOPHAGEAL ECHOCARDIOGRAM (TEE);  Surgeon: Jodelle Red, MD;  Location: [facility] ENDOSCOPY;  Service: Cardiovascular;  Laterality: N/A;   TUBAL LIGATION     Patient Active Problem List   Diagnosis Date Noted   Shoulder injury 06/30/2022   Diabetes mellitus without complication (HCC) 02/09/2022   Obesity (BMI 30.0-34.9) 11/12/2021   Anxiety 04/10/2021   Decreased hearing of left ear 04/10/2021   Depression 01/02/2021   Essential hypertension 01/02/2021   Right middle cerebral artery stroke (HCC) 08/22/2020   Hypothyroidism    Hyperlipidemia    PFO (patent foramen ovale)    Internal carotid artery occlusion, right 08/14/2020   Acute right MCA stroke (HCC) 08/12/2020   Cerebrovascular accident (CVA) (HCC)     PCP: [name], [name], MD   REFERRING PROVIDER: [name], [name], MD   REFERRING DIAG: S49.92XA (ICD-10-CM) - Injury of left shoulder, initial encounter   THERAPY DIAG:  Left shoulder pain, unspecified chronicity  Muscle weakness (generalized)  Stiffness of left shoulder, not elsewhere classified  Rationale for Evaluation and Treatment: Rehabilitation  ONSET DATE: 06/25/2022  SUBJECTIVE:  SUBJECTIVE STATEMENT:  Pt reports she woke up with some shoulder soreness. Rates pain at 3/10.    Hand dominance: Right  PERTINENT HISTORY: CVA in 08/22.  Pt had surgery including a stent-Pt still has residual numbness in L foot Patent foramen ovale closure 10/24/2020 Anxiety and depression HTN, DM, and CTS on R L ACL repair 2021   PAIN:  Are you having pain? Yes NPRS:  3/10 current, 8/10 worst, 3-4/10 best Location:  L superior/lateral/post shoulder, medial biceps and  distal L UE  PRECAUTIONS: Other: CVA in 08/22   WEIGHT BEARING RESTRICTIONS: No  FALLS:  Has patient fallen in last 6 months? No  LIVING ENVIRONMENT: Lives with: lives with their spouse Lives in: 2 story home Stairs: yes  OCCUPATION: Pt not working.  Pt volunteers at church.    PLOF: Independent.  Pt was able to perform all of her ADLs/IADLs and reaching activities independently without difficulty.    PATIENT GOALS:improved mobility and strength.    OBJECTIVE:   DIAGNOSTIC FINDINGS:  Shoulder x rays on 6/20: FINDINGS: No evidence for an acute fracture. No findings to suggest shoulder separation or dislocation. Degenerative changes are noted at the acromioclavicular joint. Cortical irregularity at the rotator cuff insertion of the humeral head is compatible with degenerative change.   IMPRESSION: Degenerative changes without acute bony findings.  Elbow x rays on 6/20: FINDINGS: No evidence for an acute fracture. No subluxation or dislocation. Degenerative spurring is seen on the coronoid process of the ulna. No fat pad elevation to suggest joint effusion.   IMPRESSION: Degenerative changes without acute bony findings.   No joint effusion.   TODAY'S TREATMENT:                                                                                                                                          Manual Therapy:   Pt received STM to biceps, delts, lats in supine. GHJ mobilizations inf and posterior glides grade II-III  Therapeutic Exercise: Pt received L shoulder PROM all planes  Pulleys in flexion 2 min Sidelying ER 2x10 Supine clasped hands flexion AAROM 2x10       PATIENT EDUCATION: Education details:  dx, exercise form and rationale, HEP, POC, and relevant anatomy.  PT answered pt's questions.  Person educated: Patient Education method: Explanation, Demonstration, Tactile cues, Verbal cues, and Handouts Education comprehension: verbalized  understanding, returned demonstration, verbal cues required, and tactile cues required  HOME EXERCISE PROGRAM: Access Code: [email or web address] URL: [email or web address] Date: 07/29/2022 Prepared by: Aaron Edelman  Exercises - Supine Shoulder Flexion AAROM   - 2 x daily - 7 x weekly - 1-2 sets - 10 reps - Seated Scapular Retraction  - 2 x daily - 7 x weekly - 2 sets - 10 reps - Seated Shoulder Flexion Towel Slide at Table Top  - 2 x daily - 7 x weekly - 1 sets - 5  reps  ASSESSMENT:  CLINICAL IMPRESSION: Pt remains guarded with PROM. Some tightness and pain reported at end range flexion and abduction. Pt with tightness in proximal biceps, so utilized STM to address this. Requires cues for utilizing full available range with flexion pulleys. Instructed pt in use of theracane for self release of periscap mm as she states she has one at home.  Will continue to improve ROM and strength as tolerated.   OBJECTIVE IMPAIRMENTS: decreased activity tolerance, decreased ROM, decreased strength, hypomobility, impaired flexibility, impaired UE functional use, and pain.   ACTIVITY LIMITATIONS: carrying, lifting, bed mobility, bathing, dressing, reach over head, and hygiene/grooming  PARTICIPATION LIMITATIONS: meal prep, cleaning, laundry, shopping, and community activity  PERSONAL FACTORS: 1 comorbidity: CVA in 2022  are also affecting patient's functional outcome.   REHAB POTENTIAL: Good  CLINICAL DECISION MAKING: Stable/uncomplicated  EVALUATION COMPLEXITY: Low   GOALS:  SHORT TERM GOALS: Target date:  08/19/2022  Pt will report at least a 25% improvement in functional usage of L UE with reduced pain. Baseline: Goal status: INITIAL  2.  Pt will demo at least a 20-25 deg improvement in L shoulder flexion and scaption AROM for improved UE elevation and reaching. Baseline:  Goal status: INITIAL  3.  Pt will be independent and compliant with HEP for improved pain, ROM, strength, and  function.  Baseline:  Goal status: INITIAL  4.  Pt will be able to use L UE with driving with increased ease. Baseline:  Goal status: INITIAL Target date:  08/26/2022    LONG TERM GOALS: Target date: 09/09/2022  Pt will demo improved flexion and scaption AROM to at least 120 deg with significantly improved compensatory shoulder hike for improved reaching and performance of overhead activities. Baseline:  Goal status: INITIAL  2.  Pt will be able to perform hair care without significant pain and difficulty.  Baseline:  Goal status: INITIAL  3.  Pt will be able to reach overhead into a cabinet. Baseline:  Goal status: INITIAL  4.  Pt will report she is able to perform bathing and dressing without significant difficulty.  Baseline:  Goal status: INITIAL  5.  Pt will report improved usage of L UE with household chores with reduced pain.  Baseline:  Goal status: INITIAL   PLAN:  PT FREQUENCY: 2x/week  PT DURATION: 6 weeks  PLANNED INTERVENTIONS: Therapeutic exercises, Therapeutic activity, Neuromuscular re-education, Patient/Family education, Self Care, Joint mobilization, Aquatic Therapy, Dry Needling, Electrical stimulation, Spinal mobilization, Cryotherapy, Moist heat, Taping, Ultrasound, Manual therapy, and Re-evaluation  PLAN FOR NEXT SESSION: review and perform HEP.  Progress shoulder ROM.       Riki Altes, PTA  08/15/22 12:38 PM

## 2022-08-19 ENCOUNTER — Ambulatory Visit (HOSPITAL_BASED_OUTPATIENT_CLINIC_OR_DEPARTMENT_OTHER): Payer: Managed Care, Other (non HMO) | Admitting: Physical Therapy

## 2022-08-19 ENCOUNTER — Encounter (HOSPITAL_BASED_OUTPATIENT_CLINIC_OR_DEPARTMENT_OTHER): Payer: Self-pay | Admitting: Physical Therapy

## 2022-08-19 DIAGNOSIS — M25512 Pain in left shoulder: Secondary | ICD-10-CM

## 2022-08-19 DIAGNOSIS — M25612 Stiffness of left shoulder, not elsewhere classified: Secondary | ICD-10-CM

## 2022-08-19 DIAGNOSIS — M6281 Muscle weakness (generalized): Secondary | ICD-10-CM

## 2022-08-19 NOTE — Therapy (Signed)
OUTPATIENT PHYSICAL THERAPY TREATMENT   Patient Name: Vicki Gonzales MRN: [id number] DOB:06/24/1965, 57 y.o., female Today's Date: 08/20/2022  END OF SESSION:  PT End of Session - 08/19/22 0810     Visit Number 6    Number of Visits 12    Date for PT Re-Evaluation 09/09/22    Authorization Type [employer]    PT Start Time 0809   Pt arrived late to treatment   PT Stop Time 0845    PT Time Calculation (min) 36 min    Activity Tolerance Patient tolerated treatment well    Behavior During Therapy WFL for tasks assessed/performed                Past Medical History:  Diagnosis Date   ADHD    Anxiety    Carpal tunnel syndrome on right    Depression    HTN (hypertension)    Hypothyroidism    Migraines    Spondylosis    Stroke [facility])    Past Surgical History:  Procedure Laterality Date   ANTERIOR CRUCIATE LIGAMENT REPAIR Left 2021   APPENDECTOMY     BUBBLE STUDY  08/17/2020   Procedure: BUBBLE STUDY;  Surgeon: Jodelle Red, MD;  Location: [facility] ENDOSCOPY;  Service: Cardiovascular;;   FALSE ANEURYSM REPAIR Right 08/15/2020   Procedure: REPAIR RIGHT FEMORAL PSEUDOANEURYSM;  Surgeon: Nada Libman, MD;  Location: [facility] OR;  Service: Vascular;  Laterality: Right;   IR ANGIO INTRA EXTRACRAN SEL COM CAROTID INNOMINATE UNI L MOD SED  08/14/2020   IR ANGIO INTRA EXTRACRAN SEL INTERNAL CAROTID UNI R MOD SED  08/14/2020   IR ANGIO VERTEBRAL SEL SUBCLAVIAN INNOMINATE BILAT MOD SED  08/14/2020   IR CT HEAD LTD  08/14/2020   IR CT HEAD LTD  08/14/2020   IR INTRA CRAN STENT  08/14/2020   IR INTRAVSC STENT CERV CAROTID W/O EMB-PROT MOD SED INC ANGIO  08/14/2020   IR RADIOLOGIST EVAL & MGMT  09/24/2020   PATENT FORAMEN OVALE(PFO) CLOSURE N/A 10/24/2020   Procedure: PATENT FORAMEN OVALE (PFO) CLOSURE;  Surgeon: Tonny Bollman, MD;  Location: [facility] INVASIVE CV LAB;  Service: Cardiovascular;  Laterality: N/A;   RADIOLOGY WITH ANESTHESIA N/A 08/14/2020   Procedure: IR WITH ANESTHESIA;  Surgeon:  Julieanne Cotton, MD;  Location: [facility] OR;  Service: Radiology;  Laterality: N/A;   TEE WITHOUT CARDIOVERSION N/A 08/17/2020   Procedure: TRANSESOPHAGEAL ECHOCARDIOGRAM (TEE);  Surgeon: Jodelle Red, MD;  Location: [facility] ENDOSCOPY;  Service: Cardiovascular;  Laterality: N/A;   TUBAL LIGATION     Patient Active Problem List   Diagnosis Date Noted   Shoulder injury 06/30/2022   Diabetes mellitus without complication (HCC) 02/09/2022   Obesity (BMI 30.0-34.9) 11/12/2021   Anxiety 04/10/2021   Decreased hearing of left ear 04/10/2021   Depression 01/02/2021   Essential hypertension 01/02/2021   Right middle cerebral artery stroke (HCC) 08/22/2020   Hypothyroidism    Hyperlipidemia    PFO (patent foramen ovale)    Internal carotid artery occlusion, right 08/14/2020   Acute right MCA stroke (HCC) 08/12/2020   Cerebrovascular accident (CVA) (HCC)     PCP: [name], [name], MD   REFERRING PROVIDER: [name], [name], MD   REFERRING DIAG: S49.92XA (ICD-10-CM) - Injury of left shoulder, initial encounter   THERAPY DIAG:  Left shoulder pain, unspecified chronicity  Muscle weakness (generalized)  Stiffness of left shoulder, not elsewhere classified  Rationale for Evaluation and Treatment: Rehabilitation  ONSET DATE: 06/25/2022  SUBJECTIVE:  SUBJECTIVE STATEMENT:  Pt denies any adverse effects after prior Rx.  Pt states her shoulder is sore currently.  Pt states she didn't do her stretching at home this weekend, but did little things around the house.     Hand dominance: Right  PERTINENT HISTORY: CVA in 08/22.  Pt had surgery including a stent-Pt still has residual numbness in L foot Patent foramen ovale closure 10/24/2020 Anxiety and depression HTN, DM, and CTS on R L ACL repair  2021   PAIN:  Are you having pain? Yes NPRS:  3/10 current, 8/10 worst, 3-4/10 best Location:  L superior/lateral/post shoulder, medial biceps and distal L UE  PRECAUTIONS: Other: CVA in 08/22   WEIGHT BEARING RESTRICTIONS: No  FALLS:  Has patient fallen in last 6 months? No  LIVING ENVIRONMENT: Lives with: lives with their spouse Lives in: 2 story home Stairs: yes  OCCUPATION: Pt not working.  Pt volunteers at church.    PLOF: Independent.  Pt was able to perform all of her ADLs/IADLs and reaching activities independently without difficulty.    PATIENT GOALS:improved mobility and strength.    OBJECTIVE:   DIAGNOSTIC FINDINGS:  Shoulder x rays on 6/20: FINDINGS: No evidence for an acute fracture. No findings to suggest shoulder separation or dislocation. Degenerative changes are noted at the acromioclavicular joint. Cortical irregularity at the rotator cuff insertion of the humeral head is compatible with degenerative change.   IMPRESSION: Degenerative changes without acute bony findings.  Elbow x rays on 6/20: FINDINGS: No evidence for an acute fracture. No subluxation or dislocation. Degenerative spurring is seen on the coronoid process of the ulna. No fat pad elevation to suggest joint effusion.   IMPRESSION: Degenerative changes without acute bony findings.   No joint effusion.   TODAY'S TREATMENT:                                                                                                                                          Manual Therapy:   Pt received STM to biceps and lats in supine. GHJ mobilizations inf and posterior glides grade II  Therapeutic Exercise: Pt received L shoulder PROM in flexion, scaption, and ER w/n pt tolerance and tissue tolerance.  Pt received L elbow extension PROM though was limited.  Pulleys in flexion 2 min Attempted S/L ER though pt unable to perform well.  Pt performed Supine ER 10-15 reps Supine clasped  hands flexion AAROM 2x10       PATIENT EDUCATION: Education details:  dx, exercise form and rationale, HEP, POC, and relevant anatomy.  PT answered pt's questions.  Person educated: Patient Education method: Explanation, Demonstration, Tactile cues, Verbal cues, and Handouts Education comprehension: verbalized understanding, returned demonstration, verbal cues required, and tactile cues required  HOME EXERCISE PROGRAM: Access Code: [email or web address] URL: [email or web address] Date: 07/29/2022 Prepared by: Aaron Edelman  Exercises - Supine Shoulder Flexion AAROM   -  2 x daily - 7 x weekly - 1-2 sets - 10 reps - Seated Scapular Retraction  - 2 x daily - 7 x weekly - 2 sets - 10 reps - Seated Shoulder Flexion Towel Slide at Table Top  - 2 x daily - 7 x weekly - 1 sets - 5 reps  ASSESSMENT:  CLINICAL IMPRESSION: Treatment time limited today due to pt arriving late.  Pt was guarded with PROM and continues to be limited with shoulder ROM.  Pt maintained her elbow in flexion and PT worked on improving elbow extension.  PT performed elbow extension PROM and she was guarded requiring much cuing to relax.  With cuing and increased repetition, Pt relaxed her elbow and had improved elbow extension.  Pt has soft tissue tightness in biceps and PT performed STM to biceps.  Pt attempted S/L ER though pt unable to perform well, and performed supine ER instead.  Pt responded well to Rx stating she felt better after Rx and had no increased pain.    OBJECTIVE IMPAIRMENTS: decreased activity tolerance, decreased ROM, decreased strength, hypomobility, impaired flexibility, impaired UE functional use, and pain.   ACTIVITY LIMITATIONS: carrying, lifting, bed mobility, bathing, dressing, reach over head, and hygiene/grooming  PARTICIPATION LIMITATIONS: meal prep, cleaning, laundry, shopping, and community activity  PERSONAL FACTORS: 1 comorbidity: CVA in 2022  are also affecting patient's functional  outcome.   REHAB POTENTIAL: Good  CLINICAL DECISION MAKING: Stable/uncomplicated  EVALUATION COMPLEXITY: Low   GOALS:  SHORT TERM GOALS: Target date:  08/19/2022  Pt will report at least a 25% improvement in functional usage of L UE with reduced pain. Baseline: Goal status: INITIAL  2.  Pt will demo at least a 20-25 deg improvement in L shoulder flexion and scaption AROM for improved UE elevation and reaching. Baseline:  Goal status: INITIAL  3.  Pt will be independent and compliant with HEP for improved pain, ROM, strength, and function.  Baseline:  Goal status: INITIAL  4.  Pt will be able to use L UE with driving with increased ease. Baseline:  Goal status: INITIAL Target date:  08/26/2022    LONG TERM GOALS: Target date: 09/09/2022  Pt will demo improved flexion and scaption AROM to at least 120 deg with significantly improved compensatory shoulder hike for improved reaching and performance of overhead activities. Baseline:  Goal status: INITIAL  2.  Pt will be able to perform hair care without significant pain and difficulty.  Baseline:  Goal status: INITIAL  3.  Pt will be able to reach overhead into a cabinet. Baseline:  Goal status: INITIAL  4.  Pt will report she is able to perform bathing and dressing without significant difficulty.  Baseline:  Goal status: INITIAL  5.  Pt will report improved usage of L UE with household chores with reduced pain.  Baseline:  Goal status: INITIAL   PLAN:  PT FREQUENCY: 2x/week  PT DURATION: 6 weeks  PLANNED INTERVENTIONS: Therapeutic exercises, Therapeutic activity, Neuromuscular re-education, Patient/Family education, Self Care, Joint mobilization, Aquatic Therapy, Dry Needling, Electrical stimulation, Spinal mobilization, Cryotherapy, Moist heat, Taping, Ultrasound, Manual therapy, and Re-evaluation  PLAN FOR NEXT SESSION: Cont with ROM, ther ex, and STW.       Audie Clear III PT, DPT 08/20/22 4:14  PM

## 2022-08-22 ENCOUNTER — Encounter (HOSPITAL_BASED_OUTPATIENT_CLINIC_OR_DEPARTMENT_OTHER): Payer: Self-pay | Admitting: Physical Therapy

## 2022-08-22 ENCOUNTER — Ambulatory Visit (HOSPITAL_BASED_OUTPATIENT_CLINIC_OR_DEPARTMENT_OTHER): Payer: Managed Care, Other (non HMO) | Admitting: Physical Therapy

## 2022-08-22 DIAGNOSIS — M25512 Pain in left shoulder: Secondary | ICD-10-CM | POA: Diagnosis not present

## 2022-08-22 DIAGNOSIS — M6281 Muscle weakness (generalized): Secondary | ICD-10-CM

## 2022-08-22 DIAGNOSIS — M25612 Stiffness of left shoulder, not elsewhere classified: Secondary | ICD-10-CM

## 2022-08-22 NOTE — Therapy (Signed)
OUTPATIENT PHYSICAL THERAPY TREATMENT   Patient Name: Vicki Gonzales MRN: [id number] DOB:[birth year], 57 y.o., female Today's Date: 08/22/2022  END OF SESSION:  PT End of Session - 08/22/22 1003     Visit Number 7    Number of Visits 12    Date for PT Re-Evaluation 09/09/22    Authorization Type [employer]    PT Start Time 0945    PT Stop Time 1027    PT Time Calculation (min) 42 min    Activity Tolerance Patient tolerated treatment well    Behavior During Therapy WFL for tasks assessed/performed                 Past Medical History:  Diagnosis Date   ADHD    Anxiety    Carpal tunnel syndrome on right    Depression    HTN (hypertension)    Hypothyroidism    Migraines    Spondylosis    Stroke [facility])    Past Surgical History:  Procedure Laterality Date   ANTERIOR CRUCIATE LIGAMENT REPAIR Left 2021   APPENDECTOMY     BUBBLE STUDY  08/17/2020   Procedure: BUBBLE STUDY;  Surgeon: Jodelle Red, MD;  Location: [facility] ENDOSCOPY;  Service: Cardiovascular;;   FALSE ANEURYSM REPAIR Right 08/15/2020   Procedure: REPAIR RIGHT FEMORAL PSEUDOANEURYSM;  Surgeon: Nada Libman, MD;  Location: [facility] OR;  Service: Vascular;  Laterality: Right;   IR ANGIO INTRA EXTRACRAN SEL COM CAROTID INNOMINATE UNI L MOD SED  08/14/2020   IR ANGIO INTRA EXTRACRAN SEL INTERNAL CAROTID UNI R MOD SED  08/14/2020   IR ANGIO VERTEBRAL SEL SUBCLAVIAN INNOMINATE BILAT MOD SED  08/14/2020   IR CT HEAD LTD  08/14/2020   IR CT HEAD LTD  08/14/2020   IR INTRA CRAN STENT  08/14/2020   IR INTRAVSC STENT CERV CAROTID W/O EMB-PROT MOD SED INC ANGIO  08/14/2020   IR RADIOLOGIST EVAL & MGMT  09/24/2020   PATENT FORAMEN OVALE(PFO) CLOSURE N/A 10/24/2020   Procedure: PATENT FORAMEN OVALE (PFO) CLOSURE;  Surgeon: Tonny Bollman, MD;  Location: [facility] INVASIVE CV LAB;  Service: Cardiovascular;  Laterality: N/A;   RADIOLOGY WITH ANESTHESIA N/A 08/14/2020   Procedure: IR WITH ANESTHESIA;  Surgeon: Julieanne Cotton, MD;   Location: [facility] OR;  Service: Radiology;  Laterality: N/A;   TEE WITHOUT CARDIOVERSION N/A 08/17/2020   Procedure: TRANSESOPHAGEAL ECHOCARDIOGRAM (TEE);  Surgeon: Jodelle Red, MD;  Location: [facility] ENDOSCOPY;  Service: Cardiovascular;  Laterality: N/A;   TUBAL LIGATION     Patient Active Problem List   Diagnosis Date Noted   Shoulder injury 06/30/2022   Diabetes mellitus without complication (HCC) 02/09/2022   Obesity (BMI 30.0-34.9) 11/12/2021   Anxiety 04/10/2021   Decreased hearing of left ear 04/10/2021   Depression 01/02/2021   Essential hypertension 01/02/2021   Right middle cerebral artery stroke (HCC) 08/22/2020   Hypothyroidism    Hyperlipidemia    PFO (patent foramen ovale)    Internal carotid artery occlusion, right 08/14/2020   Acute right MCA stroke (HCC) 08/12/2020   Cerebrovascular accident (CVA) (HCC)     PCP: [name], [name], MD   REFERRING PROVIDER: [name], [name], MD   REFERRING DIAG: S49.92XA (ICD-10-CM) - Injury of left shoulder, initial encounter   THERAPY DIAG:  Left shoulder pain, unspecified chronicity  Muscle weakness (generalized)  Stiffness of left shoulder, not elsewhere classified  Rationale for Evaluation and Treatment: Rehabilitation  ONSET DATE: 06/25/2022  SUBJECTIVE:  SUBJECTIVE STATEMENT:  "I stay consistently at a 3".  Pt denies any adverse effects after prior Rx.  Pt states her shoulder is sore currently.  Pt has been performing her HEP though has not been doing the table slides. Pt is limited with reaching and is unable to reach overhead.  Pt able to pick up her pocketbook.  She has difficulty with backing up car.      Hand dominance: Right  PERTINENT HISTORY: CVA in 08/22.  Pt had surgery including a stent-Pt still has residual numbness in L  foot Patent foramen ovale closure 10/24/2020 Anxiety and depression HTN, DM, and CTS on R L ACL repair 2021   PAIN:  Are you having pain? Yes NPRS:  3/10 current, 8/10 worst, 3-4/10 best Location:  L superior/lateral/post shoulder, medial biceps and distal L UE  PRECAUTIONS: Other: CVA in 08/22   WEIGHT BEARING RESTRICTIONS: No  FALLS:  Has patient fallen in last 6 months? No  LIVING ENVIRONMENT: Lives with: lives with their spouse Lives in: 2 story home Stairs: yes  OCCUPATION: Pt not working.  Pt volunteers at church.    PLOF: Independent.  Pt was able to perform all of her ADLs/IADLs and reaching activities independently without difficulty.    PATIENT GOALS:improved mobility and strength.    OBJECTIVE:   DIAGNOSTIC FINDINGS:  Shoulder x rays on 6/20: FINDINGS: No evidence for an acute fracture. No findings to suggest shoulder separation or dislocation. Degenerative changes are noted at the acromioclavicular joint. Cortical irregularity at the rotator cuff insertion of the humeral head is compatible with degenerative change.   IMPRESSION: Degenerative changes without acute bony findings.  Elbow x rays on 6/20: FINDINGS: No evidence for an acute fracture. No subluxation or dislocation. Degenerative spurring is seen on the coronoid process of the ulna. No fat pad elevation to suggest joint effusion.   IMPRESSION: Degenerative changes without acute bony findings.   No joint effusion.   TODAY'S TREATMENT:                                                                                                                                          Manual Therapy:   Pt received STM to biceps in supine. GHJ mobilizations inf and posterior glides grade II  Therapeutic Exercise: Pt received L shoulder PROM in flexion and scaption w/n pt tolerance and tissue tolerance.  Pt received L elbow extension PROM though was limited.  Pulleys in flexion 2x2 mins, attempted  scaption though pt unable to perform well Standing ball rolls on table in flexion 2x10 Submaximal shoulder isometrics with 5 sec hold with elbow at 90 deg against wall 2x5 reps in flexion and 1x5 reps in abd and ext Supine wand press 2x10 Supine wand flexion x 5 reps  Supine ER AROM x 12-15 reps  Pt has improved control on ER lag test though was challenged with test.  R shoulder AROM:  Flex 65 deg with compensatory shoulder hike and pain      PATIENT EDUCATION: Education details:  dx, exercise form and rationale, HEP, POC, and relevant anatomy.  PT answered pt's questions.  Person educated: Patient Education method: Explanation, Demonstration, Tactile cues, Verbal cues, and Handouts Education comprehension: verbalized understanding, returned demonstration, verbal cues required, and tactile cues required  HOME EXERCISE PROGRAM: Access Code: [email or web address] URL: [email or web address] Date: 07/29/2022 Prepared by: Aaron Edelman  Exercises - Supine Shoulder Flexion AAROM   - 2 x daily - 7 x weekly - 1-2 sets - 10 reps - Seated Scapular Retraction  - 2 x daily - 7 x weekly - 2 sets - 10 reps - Seated Shoulder Flexion Towel Slide at Table Top  - 2 x daily - 7 x weekly - 1 sets - 5 reps  ASSESSMENT:  CLINICAL IMPRESSION: Pt was guarded with PROM and continues to be limited with shoulder ROM.  Pt continues to have soft tissue tightness in biceps and is very guarded with elbow extension PROM.  She required much cuing to relax elbow.  With cuing and increased repetition, Pt relaxed her elbow and had improved elbow extension.  Pt is improving with tolerance with exercises.  Pt was able to perform supine wand flexion.  Pt had discomfort and fatigue and PT limited reps.  PT performed ER lag test and pt demonstrated better performance and had improved control.  She had no improvement in shoulder flexion AROM in standing and has significant deficits including compensatory shoulder hike and  elbow flexion.  Pt responded well to Rx stating she feels good having no increased pain after Rx.  OBJECTIVE IMPAIRMENTS: decreased activity tolerance, decreased ROM, decreased strength, hypomobility, impaired flexibility, impaired UE functional use, and pain.   ACTIVITY LIMITATIONS: carrying, lifting, bed mobility, bathing, dressing, reach over head, and hygiene/grooming  PARTICIPATION LIMITATIONS: meal prep, cleaning, laundry, shopping, and community activity  PERSONAL FACTORS: 1 comorbidity: CVA in 2022  are also affecting patient's functional outcome.   REHAB POTENTIAL: Good  CLINICAL DECISION MAKING: Stable/uncomplicated  EVALUATION COMPLEXITY: Low   GOALS:  SHORT TERM GOALS: Target date:  08/19/2022  Pt will report at least a 25% improvement in functional usage of L UE with reduced pain. Baseline: Goal status: INITIAL  2.  Pt will demo at least a 20-25 deg improvement in L shoulder flexion and scaption AROM for improved UE elevation and reaching. Baseline:  Goal status: INITIAL  3.  Pt will be independent and compliant with HEP for improved pain, ROM, strength, and function.  Baseline:  Goal status: INITIAL  4.  Pt will be able to use L UE with driving with increased ease. Baseline:  Goal status: INITIAL Target date:  08/26/2022    LONG TERM GOALS: Target date: 09/09/2022  Pt will demo improved flexion and scaption AROM to at least 120 deg with significantly improved compensatory shoulder hike for improved reaching and performance of overhead activities. Baseline:  Goal status: INITIAL  2.  Pt will be able to perform hair care without significant pain and difficulty.  Baseline:  Goal status: INITIAL  3.  Pt will be able to reach overhead into a cabinet. Baseline:  Goal status: INITIAL  4.  Pt will report she is able to perform bathing and dressing without significant difficulty.  Baseline:  Goal status: INITIAL  5.  Pt will report improved usage of L UE  with household chores with reduced pain.  Baseline:  Goal status:  INITIAL   PLAN:  PT FREQUENCY: 2x/week  PT DURATION: 6 weeks  PLANNED INTERVENTIONS: Therapeutic exercises, Therapeutic activity, Neuromuscular re-education, Patient/Family education, Self Care, Joint mobilization, Aquatic Therapy, Dry Needling, Electrical stimulation, Spinal mobilization, Cryotherapy, Moist heat, Taping, Ultrasound, Manual therapy, and Re-evaluation  PLAN FOR NEXT SESSION: Cont with ROM, ther ex, and STW.       Audie Clear III PT, DPT 08/22/22 9:43 PM

## 2022-08-26 ENCOUNTER — Ambulatory Visit (HOSPITAL_BASED_OUTPATIENT_CLINIC_OR_DEPARTMENT_OTHER): Payer: Managed Care, Other (non HMO)

## 2022-08-26 ENCOUNTER — Encounter (HOSPITAL_BASED_OUTPATIENT_CLINIC_OR_DEPARTMENT_OTHER): Payer: Self-pay

## 2022-08-26 DIAGNOSIS — M25512 Pain in left shoulder: Secondary | ICD-10-CM

## 2022-08-26 DIAGNOSIS — M6281 Muscle weakness (generalized): Secondary | ICD-10-CM

## 2022-08-26 DIAGNOSIS — M25612 Stiffness of left shoulder, not elsewhere classified: Secondary | ICD-10-CM

## 2022-08-26 NOTE — Therapy (Signed)
OUTPATIENT PHYSICAL THERAPY TREATMENT   Patient Name: Vicki Gonzales MRN: [id number] DOB:[birth year], 57 y.o., female Today's Date: 08/26/2022  END OF SESSION:  PT End of Session - 08/26/22 1232     Visit Number 8    Number of Visits 12    Date for PT Re-Evaluation 09/09/22    Authorization Type [employer]    PT Start Time 1148    PT Stop Time 1230    PT Time Calculation (min) 42 min    Activity Tolerance Patient tolerated treatment well    Behavior During Therapy WFL for tasks assessed/performed                  Past Medical History:  Diagnosis Date   ADHD    Anxiety    Carpal tunnel syndrome on right    Depression    HTN (hypertension)    Hypothyroidism    Migraines    Spondylosis    Stroke [facility])    Past Surgical History:  Procedure Laterality Date   ANTERIOR CRUCIATE LIGAMENT REPAIR Left 2021   APPENDECTOMY     BUBBLE STUDY  08/17/2020   Procedure: BUBBLE STUDY;  Surgeon: Jodelle Red, MD;  Location: [facility] ENDOSCOPY;  Service: Cardiovascular;;   FALSE ANEURYSM REPAIR Right 08/15/2020   Procedure: REPAIR RIGHT FEMORAL PSEUDOANEURYSM;  Surgeon: Nada Libman, MD;  Location: [facility] OR;  Service: Vascular;  Laterality: Right;   IR ANGIO INTRA EXTRACRAN SEL COM CAROTID INNOMINATE UNI L MOD SED  08/14/2020   IR ANGIO INTRA EXTRACRAN SEL INTERNAL CAROTID UNI R MOD SED  08/14/2020   IR ANGIO VERTEBRAL SEL SUBCLAVIAN INNOMINATE BILAT MOD SED  08/14/2020   IR CT HEAD LTD  08/14/2020   IR CT HEAD LTD  08/14/2020   IR INTRA CRAN STENT  08/14/2020   IR INTRAVSC STENT CERV CAROTID W/O EMB-PROT MOD SED INC ANGIO  08/14/2020   IR RADIOLOGIST EVAL & MGMT  09/24/2020   PATENT FORAMEN OVALE(PFO) CLOSURE N/A 10/24/2020   Procedure: PATENT FORAMEN OVALE (PFO) CLOSURE;  Surgeon: Tonny Bollman, MD;  Location: [facility] INVASIVE CV LAB;  Service: Cardiovascular;  Laterality: N/A;   RADIOLOGY WITH ANESTHESIA N/A 08/14/2020   Procedure: IR WITH ANESTHESIA;  Surgeon: Julieanne Cotton, MD;   Location: [facility] OR;  Service: Radiology;  Laterality: N/A;   TEE WITHOUT CARDIOVERSION N/A 08/17/2020   Procedure: TRANSESOPHAGEAL ECHOCARDIOGRAM (TEE);  Surgeon: Jodelle Red, MD;  Location: [facility] ENDOSCOPY;  Service: Cardiovascular;  Laterality: N/A;   TUBAL LIGATION     Patient Active Problem List   Diagnosis Date Noted   Shoulder injury 06/30/2022   Diabetes mellitus without complication (HCC) 02/09/2022   Obesity (BMI 30.0-34.9) 11/12/2021   Anxiety 04/10/2021   Decreased hearing of left ear 04/10/2021   Depression 01/02/2021   Essential hypertension 01/02/2021   Right middle cerebral artery stroke (HCC) 08/22/2020   Hypothyroidism    Hyperlipidemia    PFO (patent foramen ovale)    Internal carotid artery occlusion, right 08/14/2020   Acute right MCA stroke (HCC) 08/12/2020   Cerebrovascular accident (CVA) (HCC)     PCP: [name], [name], MD   REFERRING PROVIDER: [name], [name], MD   REFERRING DIAG: S49.92XA (ICD-10-CM) - Injury of left shoulder, initial encounter   THERAPY DIAG:  Left shoulder pain, unspecified chronicity  Stiffness of left shoulder, not elsewhere classified  Muscle weakness (generalized)  Rationale for Evaluation and Treatment: Rehabilitation  ONSET DATE: 06/25/2022  SUBJECTIVE:  SUBJECTIVE STATEMENT:  Pt arrives iwthout pain. States she has been able to use her arm more.   Hand dominance: Right  PERTINENT HISTORY: CVA in 08/22.  Pt had surgery including a stent-Pt still has residual numbness in L foot Patent foramen ovale closure 10/24/2020 Anxiety and depression HTN, DM, and CTS on R L ACL repair 2021   PAIN:  Are you having pain? No NPRS:  0/10 current, 8/10 worst, 3-4/10 best Location:  L superior/lateral/post shoulder, medial biceps and distal  L UE  PRECAUTIONS: Other: CVA in 08/22   WEIGHT BEARING RESTRICTIONS: No  FALLS:  Has patient fallen in last 6 months? No  LIVING ENVIRONMENT: Lives with: lives with their spouse Lives in: 2 story home Stairs: yes  OCCUPATION: Pt not working.  Pt volunteers at church.    PLOF: Independent.  Pt was able to perform all of her ADLs/IADLs and reaching activities independently without difficulty.    PATIENT GOALS:improved mobility and strength.    OBJECTIVE:   DIAGNOSTIC FINDINGS:  Shoulder x rays on 6/20: FINDINGS: No evidence for an acute fracture. No findings to suggest shoulder separation or dislocation. Degenerative changes are noted at the acromioclavicular joint. Cortical irregularity at the rotator cuff insertion of the humeral head is compatible with degenerative change.   IMPRESSION: Degenerative changes without acute bony findings.  Elbow x rays on 6/20: FINDINGS: No evidence for an acute fracture. No subluxation or dislocation. Degenerative spurring is seen on the coronoid process of the ulna. No fat pad elevation to suggest joint effusion.   IMPRESSION: Degenerative changes without acute bony findings.   No joint effusion.   TODAY'S TREATMENT:                                                                                                                                          Manual Therapy:   Pt received STM to biceps in supine. GHJ mobilizations inf and posterior glides grade II  Therapeutic Exercise: Pt received L shoulder PROM in flexion and scaption w/n pt tolerance and tissue tolerance.  Pt received L elbow extension PROM though was limited.  Pulleys in flexion x54min Supine wand press 1x10 Supine wand flexion x 10 reps  Sidelying ER 1x10 Seated press out x10L TB row 2x10 Finger ladder climb flexion x3        PATIENT EDUCATION: Education details:  dx, exercise form and rationale, HEP, POC, and relevant anatomy.  PT answered pt's  questions.  Person educated: Patient Education method: Explanation, Demonstration, Tactile cues, Verbal cues, and Handouts Education comprehension: verbalized understanding, returned demonstration, verbal cues required, and tactile cues required  HOME EXERCISE PROGRAM: Access Code: [email or web address] URL: [email or web address] Date: 07/29/2022 Prepared by: Aaron Edelman  Exercises - Supine Shoulder Flexion AAROM   - 2 x daily - 7 x weekly - 1-2 sets - 10 reps - Seated Scapular Retraction  - 2 x daily -  7 x weekly - 2 sets - 10 reps - Seated Shoulder Flexion Towel Slide at Table Top  - 2 x daily - 7 x weekly - 1 sets - 5 reps  ASSESSMENT:  CLINICAL IMPRESSION: Continues to guard heavily with passive movement, though pain free aside from end ranges with overpressure. Tight into end ranges. With wand flexion, she had no pain, only tightness and very mild soreness. Improved ability for [facility] today, though requires cues to correct shoulder hiking. Will continue to progress as tolerated.   OBJECTIVE IMPAIRMENTS: decreased activity tolerance, decreased ROM, decreased strength, hypomobility, impaired flexibility, impaired UE functional use, and pain.   ACTIVITY LIMITATIONS: carrying, lifting, bed mobility, bathing, dressing, reach over head, and hygiene/grooming  PARTICIPATION LIMITATIONS: meal prep, cleaning, laundry, shopping, and community activity  PERSONAL FACTORS: 1 comorbidity: CVA in 2022  are also affecting patient's functional outcome.   REHAB POTENTIAL: Good  CLINICAL DECISION MAKING: Stable/uncomplicated  EVALUATION COMPLEXITY: Low   GOALS:  SHORT TERM GOALS: Target date:  08/19/2022  Pt will report at least a 25% improvement in functional usage of L UE with reduced pain. Baseline: Goal status: INITIAL  2.  Pt will demo at least a 20-25 deg improvement in L shoulder flexion and scaption AROM for improved UE elevation and reaching. Baseline:  Goal status:  INITIAL  3.  Pt will be independent and compliant with HEP for improved pain, ROM, strength, and function.  Baseline:  Goal status: INITIAL  4.  Pt will be able to use L UE with driving with increased ease. Baseline:  Goal status: INITIAL Target date:  08/26/2022    LONG TERM GOALS: Target date: 09/09/2022  Pt will demo improved flexion and scaption AROM to at least 120 deg with significantly improved compensatory shoulder hike for improved reaching and performance of overhead activities. Baseline:  Goal status: INITIAL  2.  Pt will be able to perform hair care without significant pain and difficulty.  Baseline:  Goal status: INITIAL  3.  Pt will be able to reach overhead into a cabinet. Baseline:  Goal status: INITIAL  4.  Pt will report she is able to perform bathing and dressing without significant difficulty.  Baseline:  Goal status: INITIAL  5.  Pt will report improved usage of L UE with household chores with reduced pain.  Baseline:  Goal status: INITIAL   PLAN:  PT FREQUENCY: 2x/week  PT DURATION: 6 weeks  PLANNED INTERVENTIONS: Therapeutic exercises, Therapeutic activity, Neuromuscular re-education, Patient/Family education, Self Care, Joint mobilization, Aquatic Therapy, Dry Needling, Electrical stimulation, Spinal mobilization, Cryotherapy, Moist heat, Taping, Ultrasound, Manual therapy, and Re-evaluation  PLAN FOR NEXT SESSION: Cont with ROM, ther ex, and STW.       Riki Altes, PTA  08/26/22 12:34 PM

## 2022-08-29 ENCOUNTER — Encounter (HOSPITAL_BASED_OUTPATIENT_CLINIC_OR_DEPARTMENT_OTHER): Payer: Self-pay

## 2022-08-29 ENCOUNTER — Ambulatory Visit (HOSPITAL_BASED_OUTPATIENT_CLINIC_OR_DEPARTMENT_OTHER): Payer: Managed Care, Other (non HMO)

## 2022-08-29 DIAGNOSIS — M6281 Muscle weakness (generalized): Secondary | ICD-10-CM

## 2022-08-29 DIAGNOSIS — M25612 Stiffness of left shoulder, not elsewhere classified: Secondary | ICD-10-CM

## 2022-08-29 DIAGNOSIS — M25512 Pain in left shoulder: Secondary | ICD-10-CM

## 2022-08-29 NOTE — Therapy (Signed)
OUTPATIENT PHYSICAL THERAPY TREATMENT   Patient Name: Vicki Gonzales MRN: [id number] DOB:09-30-1965, 57 y.o., female Today's Date: 08/29/2022  END OF SESSION:  PT End of Session - 08/29/22 0801     Visit Number 9    Number of Visits 12    Date for PT Re-Evaluation 09/09/22    Authorization Type [employer]    PT Start Time 0803    PT Stop Time 0845    PT Time Calculation (min) 42 min    Activity Tolerance Patient tolerated treatment well    Behavior During Therapy WFL for tasks assessed/performed                   Past Medical History:  Diagnosis Date   ADHD    Anxiety    Carpal tunnel syndrome on right    Depression    HTN (hypertension)    Hypothyroidism    Migraines    Spondylosis    Stroke [facility])    Past Surgical History:  Procedure Laterality Date   ANTERIOR CRUCIATE LIGAMENT REPAIR Left 2021   APPENDECTOMY     BUBBLE STUDY  08/17/2020   Procedure: BUBBLE STUDY;  Surgeon: Jodelle Red, MD;  Location: [facility] ENDOSCOPY;  Service: Cardiovascular;;   FALSE ANEURYSM REPAIR Right 08/15/2020   Procedure: REPAIR RIGHT FEMORAL PSEUDOANEURYSM;  Surgeon: Nada Libman, MD;  Location: [facility] OR;  Service: Vascular;  Laterality: Right;   IR ANGIO INTRA EXTRACRAN SEL COM CAROTID INNOMINATE UNI L MOD SED  08/14/2020   IR ANGIO INTRA EXTRACRAN SEL INTERNAL CAROTID UNI R MOD SED  08/14/2020   IR ANGIO VERTEBRAL SEL SUBCLAVIAN INNOMINATE BILAT MOD SED  08/14/2020   IR CT HEAD LTD  08/14/2020   IR CT HEAD LTD  08/14/2020   IR INTRA CRAN STENT  08/14/2020   IR INTRAVSC STENT CERV CAROTID W/O EMB-PROT MOD SED INC ANGIO  08/14/2020   IR RADIOLOGIST EVAL & MGMT  09/24/2020   PATENT FORAMEN OVALE(PFO) CLOSURE N/A 10/24/2020   Procedure: PATENT FORAMEN OVALE (PFO) CLOSURE;  Surgeon: Tonny Bollman, MD;  Location: [facility] INVASIVE CV LAB;  Service: Cardiovascular;  Laterality: N/A;   RADIOLOGY WITH ANESTHESIA N/A 08/14/2020   Procedure: IR WITH ANESTHESIA;  Surgeon: Julieanne Cotton, MD;   Location: [facility] OR;  Service: Radiology;  Laterality: N/A;   TEE WITHOUT CARDIOVERSION N/A 08/17/2020   Procedure: TRANSESOPHAGEAL ECHOCARDIOGRAM (TEE);  Surgeon: Jodelle Red, MD;  Location: [place] ENDOSCOPY;  Service: Cardiovascular;  Laterality: N/A;   TUBAL LIGATION     Patient Active Problem List   Diagnosis Date Noted   Shoulder injury 06/30/2022   Diabetes mellitus without complication (HCC) 02/09/2022   Obesity (BMI 30.0-34.9) 11/12/2021   Anxiety 04/10/2021   Decreased hearing of left ear 04/10/2021   Depression 01/02/2021   Essential hypertension 01/02/2021   Right middle cerebral artery stroke (HCC) 08/22/2020   Hypothyroidism    Hyperlipidemia    PFO (patent foramen ovale)    Internal carotid artery occlusion, right 08/14/2020   Acute right MCA stroke (HCC) 08/12/2020   Cerebrovascular accident (CVA) (HCC)     PCP: [name], [name], MD   REFERRING PROVIDER: [name], [name], MD   REFERRING DIAG: S49.92XA (ICD-10-CM) - Injury of left shoulder, initial encounter   THERAPY DIAG:  Muscle weakness (generalized)  Stiffness of left shoulder, not elsewhere classified  Left shoulder pain, unspecified chronicity  Rationale for Evaluation and Treatment: Rehabilitation  ONSET DATE: 06/25/2022  SUBJECTIVE:  SUBJECTIVE STATEMENT:  No pain at entry. Good response after last session.    Hand dominance: Right  PERTINENT HISTORY: CVA in 08/22.  Pt had surgery including a stent-Pt still has residual numbness in L foot Patent foramen ovale closure 10/24/2020 Anxiety and depression HTN, DM, and CTS on R L ACL repair 2021   PAIN:  Are you having pain? No NPRS:  0/10 current, 8/10 worst, 3-4/10 best Location:  L superior/lateral/post shoulder, medial biceps and distal L  UE  PRECAUTIONS: Other: CVA in 08/22   WEIGHT BEARING RESTRICTIONS: No  FALLS:  Has patient fallen in last 6 months? No  LIVING ENVIRONMENT: Lives with: lives with their spouse Lives in: 2 story home Stairs: yes  OCCUPATION: Pt not working.  Pt volunteers at church.    PLOF: Independent.  Pt was able to perform all of her ADLs/IADLs and reaching activities independently without difficulty.    PATIENT GOALS:improved mobility and strength.    OBJECTIVE:   DIAGNOSTIC FINDINGS:  Shoulder x rays on 6/20: FINDINGS: No evidence for an acute fracture. No findings to suggest shoulder separation or dislocation. Degenerative changes are noted at the acromioclavicular joint. Cortical irregularity at the rotator cuff insertion of the humeral head is compatible with degenerative change.   IMPRESSION: Degenerative changes without acute bony findings.  Elbow x rays on 6/20: FINDINGS: No evidence for an acute fracture. No subluxation or dislocation. Degenerative spurring is seen on the coronoid process of the ulna. No fat pad elevation to suggest joint effusion.   IMPRESSION: Degenerative changes without acute bony findings.   No joint effusion.   TODAY'S TREATMENT:                                                                                                                                          Manual Therapy:   Pt received STM to biceps in supine. GHJ mobilizations inf and posterior glides grade II  Therapeutic Exercise: Pt received L shoulder PROM in flexion and scaption w/n pt tolerance and tissue tolerance.  Pt received L elbow extension PROM though was limited.  Pulleys in flexion x25min Supine wand flexion 2x 10 reps  Active flexion in supine 2x10 Sidelying active abduction (mod clinician assist) Sidelying ER 2x10 Supine press up 1# 2x10 Wgt shift at wall x10ea Seated press out 2x10L TB row 2x10 GTB Finger ladder climb flexion x4        PATIENT  EDUCATION: Education details:  dx, exercise form and rationale, HEP, POC, and relevant anatomy.  PT answered pt's questions.  Person educated: Patient Education method: Explanation, Demonstration, Tactile cues, Verbal cues, and Handouts Education comprehension: verbalized understanding, returned demonstration, verbal cues required, and tactile cues required  HOME EXERCISE PROGRAM: Access Code: [email or web address] URL: [email or web address] Date: 07/29/2022 Prepared by: Aaron Edelman  Exercises - Supine Shoulder Flexion AAROM   - 2 x daily - 7 x weekly -  1-2 sets - 10 reps - Seated Scapular Retraction  - 2 x daily - 7 x weekly - 2 sets - 10 reps - Seated Shoulder Flexion Towel Slide at Table Top  - 2 x daily - 7 x weekly - 1 sets - 5 reps  ASSESSMENT:  CLINICAL IMPRESSION: Continues to guard heavily with passive movement, though pain free aside from end ranges with overpressure. Tight into end ranges. Continued with AAROM interventions with cues to stretch into end ranges while avoiding pain. Able to tolerate active supine flexion with good response, though required clinician assist with sidelying abduction.   OBJECTIVE IMPAIRMENTS: decreased activity tolerance, decreased ROM, decreased strength, hypomobility, impaired flexibility, impaired UE functional use, and pain.   ACTIVITY LIMITATIONS: carrying, lifting, bed mobility, bathing, dressing, reach over head, and hygiene/grooming  PARTICIPATION LIMITATIONS: meal prep, cleaning, laundry, shopping, and community activity  PERSONAL FACTORS: 1 comorbidity: CVA in 2022  are also affecting patient's functional outcome.   REHAB POTENTIAL: Good  CLINICAL DECISION MAKING: Stable/uncomplicated  EVALUATION COMPLEXITY: Low   GOALS:  SHORT TERM GOALS: Target date:  08/19/2022  Pt will report at least a 25% improvement in functional usage of L UE with reduced pain. Baseline: Goal status: INITIAL  2.  Pt will demo at least a 20-25 deg  improvement in L shoulder flexion and scaption AROM for improved UE elevation and reaching. Baseline:  Goal status: INITIAL  3.  Pt will be independent and compliant with HEP for improved pain, ROM, strength, and function.  Baseline:  Goal status: INITIAL  4.  Pt will be able to use L UE with driving with increased ease. Baseline:  Goal status: INITIAL Target date:  08/26/2022    LONG TERM GOALS: Target date: 09/09/2022  Pt will demo improved flexion and scaption AROM to at least 120 deg with significantly improved compensatory shoulder hike for improved reaching and performance of overhead activities. Baseline:  Goal status: INITIAL  2.  Pt will be able to perform hair care without significant pain and difficulty.  Baseline:  Goal status: INITIAL  3.  Pt will be able to reach overhead into a cabinet. Baseline:  Goal status: INITIAL  4.  Pt will report she is able to perform bathing and dressing without significant difficulty.  Baseline:  Goal status: INITIAL  5.  Pt will report improved usage of L UE with household chores with reduced pain.  Baseline:  Goal status: INITIAL   PLAN:  PT FREQUENCY: 2x/week  PT DURATION: 6 weeks  PLANNED INTERVENTIONS: Therapeutic exercises, Therapeutic activity, Neuromuscular re-education, Patient/Family education, Self Care, Joint mobilization, Aquatic Therapy, Dry Needling, Electrical stimulation, Spinal mobilization, Cryotherapy, Moist heat, Taping, Ultrasound, Manual therapy, and Re-evaluation  PLAN FOR NEXT SESSION: Cont with ROM, ther ex, and STW.       Riki Altes, PTA  08/29/22 9:19 AM

## 2022-09-02 ENCOUNTER — Encounter (HOSPITAL_BASED_OUTPATIENT_CLINIC_OR_DEPARTMENT_OTHER): Payer: Self-pay

## 2022-09-02 ENCOUNTER — Ambulatory Visit (HOSPITAL_BASED_OUTPATIENT_CLINIC_OR_DEPARTMENT_OTHER): Payer: Managed Care, Other (non HMO)

## 2022-09-02 DIAGNOSIS — M25512 Pain in left shoulder: Secondary | ICD-10-CM | POA: Diagnosis not present

## 2022-09-02 DIAGNOSIS — M6281 Muscle weakness (generalized): Secondary | ICD-10-CM

## 2022-09-02 DIAGNOSIS — M25612 Stiffness of left shoulder, not elsewhere classified: Secondary | ICD-10-CM

## 2022-09-02 NOTE — Therapy (Signed)
OUTPATIENT PHYSICAL THERAPY TREATMENT   Patient Name: Vicki Gonzales MRN: [id number] DOB:[birth year], 57 y.o., female Today's Date: 09/02/2022  END OF SESSION:  PT End of Session - 09/02/22 1225     Visit Number 10    Number of Visits 12    Date for PT Re-Evaluation 09/09/22    Authorization Type [employer]    PT Start Time 1151    PT Stop Time 1230    PT Time Calculation (min) 39 min    Activity Tolerance Patient tolerated treatment well    Behavior During Therapy WFL for tasks assessed/performed                    Past Medical History:  Diagnosis Date   ADHD    Anxiety    Carpal tunnel syndrome on right    Depression    HTN (hypertension)    Hypothyroidism    Migraines    Spondylosis    Stroke [facility])    Past Surgical History:  Procedure Laterality Date   ANTERIOR CRUCIATE LIGAMENT REPAIR Left 2021   APPENDECTOMY     BUBBLE STUDY  08/17/2020   Procedure: BUBBLE STUDY;  Surgeon: Jodelle Red, MD;  Location: [facility] ENDOSCOPY;  Service: Cardiovascular;;   FALSE ANEURYSM REPAIR Right 08/15/2020   Procedure: REPAIR RIGHT FEMORAL PSEUDOANEURYSM;  Surgeon: Nada Libman, MD;  Location: [facility] OR;  Service: Vascular;  Laterality: Right;   IR ANGIO INTRA EXTRACRAN SEL COM CAROTID INNOMINATE UNI L MOD SED  08/14/2020   IR ANGIO INTRA EXTRACRAN SEL INTERNAL CAROTID UNI R MOD SED  08/14/2020   IR ANGIO VERTEBRAL SEL SUBCLAVIAN INNOMINATE BILAT MOD SED  08/14/2020   IR CT HEAD LTD  08/14/2020   IR CT HEAD LTD  08/14/2020   IR INTRA CRAN STENT  08/14/2020   IR INTRAVSC STENT CERV CAROTID W/O EMB-PROT MOD SED INC ANGIO  08/14/2020   IR RADIOLOGIST EVAL & MGMT  09/24/2020   PATENT FORAMEN OVALE(PFO) CLOSURE N/A 10/24/2020   Procedure: PATENT FORAMEN OVALE (PFO) CLOSURE;  Surgeon: Tonny Bollman, MD;  Location: [facility] INVASIVE CV LAB;  Service: Cardiovascular;  Laterality: N/A;   RADIOLOGY WITH ANESTHESIA N/A 08/14/2020   Procedure: IR WITH ANESTHESIA;  Surgeon: Julieanne Cotton,  MD;  Location: [facility] OR;  Service: Radiology;  Laterality: N/A;   TEE WITHOUT CARDIOVERSION N/A 08/17/2020   Procedure: TRANSESOPHAGEAL ECHOCARDIOGRAM (TEE);  Surgeon: Jodelle Red, MD;  Location: [facility] ENDOSCOPY;  Service: Cardiovascular;  Laterality: N/A;   TUBAL LIGATION     Patient Active Problem List   Diagnosis Date Noted   Shoulder injury 06/30/2022   Diabetes mellitus without complication (HCC) 02/09/2022   Obesity (BMI 30.0-34.9) 11/12/2021   Anxiety 04/10/2021   Decreased hearing of left ear 04/10/2021   Depression 01/02/2021   Essential hypertension 01/02/2021   Right middle cerebral artery stroke (HCC) 08/22/2020   Hypothyroidism    Hyperlipidemia    PFO (patent foramen ovale)    Internal carotid artery occlusion, right 08/14/2020   Acute right MCA stroke (HCC) 08/12/2020   Cerebrovascular accident (CVA) (HCC)     PCP: [name], [name], MD   REFERRING PROVIDER: [name], [name], MD   REFERRING DIAG: S49.92XA (ICD-10-CM) - Injury of left shoulder, initial encounter   THERAPY DIAG:  Muscle weakness (generalized)  Left shoulder pain, unspecified chronicity  Stiffness of left shoulder, not elsewhere classified  Rationale for Evaluation and Treatment: Rehabilitation  ONSET DATE: 06/25/2022  SUBJECTIVE:  SUBJECTIVE STATEMENT:  No pain at entry. Good response after last session.    Hand dominance: Right  PERTINENT HISTORY: CVA in 08/22.  Pt had surgery including a stent-Pt still has residual numbness in L foot Patent foramen ovale closure 10/24/2020 Anxiety and depression HTN, DM, and CTS on R L ACL repair 2021   PAIN:  Are you having pain? No NPRS:  0/10 current, 8/10 worst, 3-4/10 best Location:  L superior/lateral/post shoulder, medial biceps and distal L  UE  PRECAUTIONS: Other: CVA in 08/22   WEIGHT BEARING RESTRICTIONS: No  FALLS:  Has patient fallen in last 6 months? No  LIVING ENVIRONMENT: Lives with: lives with their spouse Lives in: 2 story home Stairs: yes  OCCUPATION: Pt not working.  Pt volunteers at church.    PLOF: Independent.  Pt was able to perform all of her ADLs/IADLs and reaching activities independently without difficulty.    PATIENT GOALS:improved mobility and strength.    OBJECTIVE:   DIAGNOSTIC FINDINGS:  Shoulder x rays on 6/20: FINDINGS: No evidence for an acute fracture. No findings to suggest shoulder separation or dislocation. Degenerative changes are noted at the acromioclavicular joint. Cortical irregularity at the rotator cuff insertion of the humeral head is compatible with degenerative change.   IMPRESSION: Degenerative changes without acute bony findings.  Elbow x rays on 6/20: FINDINGS: No evidence for an acute fracture. No subluxation or dislocation. Degenerative spurring is seen on the coronoid process of the ulna. No fat pad elevation to suggest joint effusion.   IMPRESSION: Degenerative changes without acute bony findings.   No joint effusion.   TODAY'S TREATMENT:                                                                                                                                           Therapeutic Exercise: Pt received L shoulder PROM in flexion and scaption w/n pt tolerance and tissue tolerance.   Pulleys in flexion x58min Supine wand flexion 2x 10 reps  Active flexion in supine 2x10 Sidelying active abduction (min clinician assist) x10 Sidelying ER 1x10 Supine press up 1# x10, 2# x10 Finger ladder climb flexion x5        PATIENT EDUCATION: Education details:  dx, exercise form and rationale, HEP, POC, and relevant anatomy.  PT answered pt's questions.  Person educated: Patient Education method: Explanation, Demonstration, Tactile cues, Verbal  cues, and Handouts Education comprehension: verbalized understanding, returned demonstration, verbal cues required, and tactile cues required  HOME EXERCISE PROGRAM: Access Code: [email or web address] URL: [email or web address] Date: 07/29/2022 Prepared by: Aaron Edelman  Exercises - Supine Shoulder Flexion AAROM   - 2 x daily - 7 x weekly - 1-2 sets - 10 reps - Seated Scapular Retraction  - 2 x daily - 7 x weekly - 2 sets - 10 reps - Seated Shoulder Flexion Towel Slide at Table Top  - 2 x daily -  7 x weekly - 1 sets - 5 reps  ASSESSMENT:  CLINICAL IMPRESSION: Continues to guard heavily with passive movement, though pain free aside from end ranges with overpressure. Has better performance with wand flexion in supine with verbal cues for end range stretching. Some non-painful popping felt with sidelying abduction and ER. Will continue to monitor pain level and progress as tolerated.   OBJECTIVE IMPAIRMENTS: decreased activity tolerance, decreased ROM, decreased strength, hypomobility, impaired flexibility, impaired UE functional use, and pain.   ACTIVITY LIMITATIONS: carrying, lifting, bed mobility, bathing, dressing, reach over head, and hygiene/grooming  PARTICIPATION LIMITATIONS: meal prep, cleaning, laundry, shopping, and community activity  PERSONAL FACTORS: 1 comorbidity: CVA in 2022  are also affecting patient's functional outcome.   REHAB POTENTIAL: Good  CLINICAL DECISION MAKING: Stable/uncomplicated  EVALUATION COMPLEXITY: Low   GOALS:  SHORT TERM GOALS: Target date:  08/19/2022  Pt will report at least a 25% improvement in functional usage of L UE with reduced pain. Baseline: Goal status: MET 8/27  2.  Pt will demo at least a 20-25 deg improvement in L shoulder flexion and scaption AROM for improved UE elevation and reaching. Baseline:  Goal status: INITIAL  3.  Pt will be independent and compliant with HEP for improved pain, ROM, strength, and function.   Baseline:  Goal status: IN PROGRESS 8/27  4.  Pt will be able to use L UE with driving with increased ease. Baseline:  Goal status: MET 8/27 Target date:  08/26/2022    LONG TERM GOALS: Target date: 09/09/2022  Pt will demo improved flexion and scaption AROM to at least 120 deg with significantly improved compensatory shoulder hike for improved reaching and performance of overhead activities. Baseline:  Goal status: INITIAL  2.  Pt will be able to perform hair care without significant pain and difficulty.  Baseline:  Goal status: IN PROGRESS 8/27  3.  Pt will be able to reach overhead into a cabinet. Baseline:  Goal status: IN PROGRESS 8/27  4.  Pt will report she is able to perform bathing and dressing without significant difficulty.  Baseline:  Goal status: MET 8/27  5.  Pt will report improved usage of L UE with household chores with reduced pain.  Baseline:  Goal status: MET 8/27   PLAN:  PT FREQUENCY: 2x/week  PT DURATION: 6 weeks  PLANNED INTERVENTIONS: Therapeutic exercises, Therapeutic activity, Neuromuscular re-education, Patient/Family education, Self Care, Joint mobilization, Aquatic Therapy, Dry Needling, Electrical stimulation, Spinal mobilization, Cryotherapy, Moist heat, Taping, Ultrasound, Manual therapy, and Re-evaluation  PLAN FOR NEXT SESSION: Cont with ROM, ther ex, and STW.       Riki Altes, PTA  09/02/22 1:09 PM

## 2022-09-05 ENCOUNTER — Ambulatory Visit (HOSPITAL_BASED_OUTPATIENT_CLINIC_OR_DEPARTMENT_OTHER): Payer: Managed Care, Other (non HMO)

## 2022-09-05 ENCOUNTER — Encounter (HOSPITAL_BASED_OUTPATIENT_CLINIC_OR_DEPARTMENT_OTHER): Payer: Self-pay

## 2022-09-05 DIAGNOSIS — M6281 Muscle weakness (generalized): Secondary | ICD-10-CM

## 2022-09-05 DIAGNOSIS — M25512 Pain in left shoulder: Secondary | ICD-10-CM

## 2022-09-05 DIAGNOSIS — M25612 Stiffness of left shoulder, not elsewhere classified: Secondary | ICD-10-CM

## 2022-09-05 NOTE — Therapy (Signed)
OUTPATIENT PHYSICAL THERAPY TREATMENT   Patient Name: Vicki Gonzales MRN: [id number] DOB:[birth year], 57 y.o., female Today's Date: 09/05/2022  END OF SESSION:  PT End of Session - 09/05/22 1146     Visit Number 11    Number of Visits 12    Date for PT Re-Evaluation 09/09/22    Authorization Type [employer]    PT Start Time 1146    PT Stop Time 1233    PT Time Calculation (min) 47 min    Activity Tolerance Patient tolerated treatment well    Behavior During Therapy WFL for tasks assessed/performed                     Past Medical History:  Diagnosis Date   ADHD    Anxiety    Carpal tunnel syndrome on right    Depression    HTN (hypertension)    Hypothyroidism    Migraines    Spondylosis    Stroke [facility])    Past Surgical History:  Procedure Laterality Date   ANTERIOR CRUCIATE LIGAMENT REPAIR Left 2021   APPENDECTOMY     BUBBLE STUDY  08/17/2020   Procedure: BUBBLE STUDY;  Surgeon: Jodelle Red, MD;  Location: [facility] ENDOSCOPY;  Service: Cardiovascular;;   FALSE ANEURYSM REPAIR Right 08/15/2020   Procedure: REPAIR RIGHT FEMORAL PSEUDOANEURYSM;  Surgeon: Nada Libman, MD;  Location: [facility] OR;  Service: Vascular;  Laterality: Right;   IR ANGIO INTRA EXTRACRAN SEL COM CAROTID INNOMINATE UNI L MOD SED  08/14/2020   IR ANGIO INTRA EXTRACRAN SEL INTERNAL CAROTID UNI R MOD SED  08/14/2020   IR ANGIO VERTEBRAL SEL SUBCLAVIAN INNOMINATE BILAT MOD SED  08/14/2020   IR CT HEAD LTD  08/14/2020   IR CT HEAD LTD  08/14/2020   IR INTRA CRAN STENT  08/14/2020   IR INTRAVSC STENT CERV CAROTID W/O EMB-PROT MOD SED INC ANGIO  08/14/2020   IR RADIOLOGIST EVAL & MGMT  09/24/2020   PATENT FORAMEN OVALE(PFO) CLOSURE N/A 10/24/2020   Procedure: PATENT FORAMEN OVALE (PFO) CLOSURE;  Surgeon: Tonny Bollman, MD;  Location: [facility] INVASIVE CV LAB;  Service: Cardiovascular;  Laterality: N/A;   RADIOLOGY WITH ANESTHESIA N/A 08/14/2020   Procedure: IR WITH ANESTHESIA;  Surgeon: Julieanne Cotton,  MD;  Location: [facility] OR;  Service: Radiology;  Laterality: N/A;   TEE WITHOUT CARDIOVERSION N/A 08/17/2020   Procedure: TRANSESOPHAGEAL ECHOCARDIOGRAM (TEE);  Surgeon: Jodelle Red, MD;  Location: [facility] ENDOSCOPY;  Service: Cardiovascular;  Laterality: N/A;   TUBAL LIGATION     Patient Active Problem List   Diagnosis Date Noted   Shoulder injury 06/30/2022   Diabetes mellitus without complication (HCC) 02/09/2022   Obesity (BMI 30.0-34.9) 11/12/2021   Anxiety 04/10/2021   Decreased hearing of left ear 04/10/2021   Depression 01/02/2021   Essential hypertension 01/02/2021   Right middle cerebral artery stroke (HCC) 08/22/2020   Hypothyroidism    Hyperlipidemia    PFO (patent foramen ovale)    Internal carotid artery occlusion, right 08/14/2020   Acute right MCA stroke (HCC) 08/12/2020   Cerebrovascular accident (CVA) (HCC)     PCP: [name], [name], MD   REFERRING PROVIDER: [name], [name], MD   REFERRING DIAG: S49.92XA (ICD-10-CM) - Injury of left shoulder, initial encounter   THERAPY DIAG:  Muscle weakness (generalized)  Left shoulder pain, unspecified chronicity  Stiffness of left shoulder, not elsewhere classified  Rationale for Evaluation and Treatment: Rehabilitation  ONSET DATE: 06/25/2022  SUBJECTIVE:  SUBJECTIVE STATEMENT:  Pt reports "I feel like I could lift it higher, but I hold myself back." Denies pain at entry.   Hand dominance: Right  PERTINENT HISTORY: CVA in 08/22.  Pt had surgery including a stent-Pt still has residual numbness in L foot Patent foramen ovale closure 10/24/2020 Anxiety and depression HTN, DM, and CTS on R L ACL repair 2021   PAIN:  Are you having pain? No NPRS:  0/10 current, 8/10 worst, 3-4/10 best Location:  L superior/lateral/post  shoulder, medial biceps and distal L UE  PRECAUTIONS: Other: CVA in 08/22   WEIGHT BEARING RESTRICTIONS: No  FALLS:  Has patient fallen in last 6 months? No  LIVING ENVIRONMENT: Lives with: lives with their spouse Lives in: 2 story home Stairs: yes  OCCUPATION: Pt not working.  Pt volunteers at church.    PLOF: Independent.  Pt was able to perform all of her ADLs/IADLs and reaching activities independently without difficulty.    PATIENT GOALS:improved mobility and strength.    OBJECTIVE:   DIAGNOSTIC FINDINGS:  Shoulder x rays on 6/20: FINDINGS: No evidence for an acute fracture. No findings to suggest shoulder separation or dislocation. Degenerative changes are noted at the acromioclavicular joint. Cortical irregularity at the rotator cuff insertion of the humeral head is compatible with degenerative change.   IMPRESSION: Degenerative changes without acute bony findings.  Elbow x rays on 6/20: FINDINGS: No evidence for an acute fracture. No subluxation or dislocation. Degenerative spurring is seen on the coronoid process of the ulna. No fat pad elevation to suggest joint effusion.   IMPRESSION: Degenerative changes without acute bony findings.   No joint effusion.   TODAY'S TREATMENT:                                                                                                                                             STM to brachialis/wrist flexor compartment as well as posterior capsule and prox. Biceps.    Therapeutic Exercise: Pt received L shoulder PROM in flexion and scaption w/n pt tolerance and tissue tolerance.    Pulleys in flexion and abduction x72minea Supine wand flexion 2x 10 reps  Active supine short arc flexion 1# 2x10 Active flexion in supine 2x10 Sidelying active abduction (min clinician assist) x10 Supine press up  2# 2x10 Posterior capsule stretch instruction for HEP.     PATIENT EDUCATION: Education details:  dx, exercise  form and rationale, HEP, POC, and relevant anatomy.  PT answered pt's questions.  Person educated: Patient Education method: Explanation, Demonstration, Tactile cues, Verbal cues, and Handouts Education comprehension: verbalized understanding, returned demonstration, verbal cues required, and tactile cues required  HOME EXERCISE PROGRAM: Access Code: [email or web address] URL: [email or web address] Date: 07/29/2022 Prepared by: Aaron Edelman  Exercises - Supine Shoulder Flexion AAROM   - 2 x daily - 7 x weekly - 1-2 sets - 10 reps - Seated  Scapular Retraction  - 2 x daily - 7 x weekly - 2 sets - 10 reps - Seated Shoulder Flexion Towel Slide at Table Top  - 2 x daily - 7 x weekly - 1 sets - 5 reps  ASSESSMENT:  CLINICAL IMPRESSION: Pt had decreased guarding with PROM today. Notable occasional crepitus with PROM. Has tightness and tenderness in distal brachialis and proximal wrist flexor group. Instructed pt in stretching for this area to perform at home. She was able to progress gently with strengthening today. Remains challenged by sidelying abduction and requires tactile cues to maintain UE in proper plane. Sees MD for f/u on 9/10. Re-val next visit.   OBJECTIVE IMPAIRMENTS: decreased activity tolerance, decreased ROM, decreased strength, hypomobility, impaired flexibility, impaired UE functional use, and pain.   ACTIVITY LIMITATIONS: carrying, lifting, bed mobility, bathing, dressing, reach over head, and hygiene/grooming  PARTICIPATION LIMITATIONS: meal prep, cleaning, laundry, shopping, and community activity  PERSONAL FACTORS: 1 comorbidity: CVA in 2022  are also affecting patient's functional outcome.   REHAB POTENTIAL: Good  CLINICAL DECISION MAKING: Stable/uncomplicated  EVALUATION COMPLEXITY: Low   GOALS:  SHORT TERM GOALS: Target date:  08/19/2022  Pt will report at least a 25% improvement in functional usage of L UE with reduced pain. Baseline: Goal status: MET  8/27  2.  Pt will demo at least a 20-25 deg improvement in L shoulder flexion and scaption AROM for improved UE elevation and reaching. Baseline:  Goal status: INITIAL  3.  Pt will be independent and compliant with HEP for improved pain, ROM, strength, and function.  Baseline:  Goal status: IN PROGRESS 8/27  4.  Pt will be able to use L UE with driving with increased ease. Baseline:  Goal status: MET 8/27 Target date:  08/26/2022    LONG TERM GOALS: Target date: 09/09/2022  Pt will demo improved flexion and scaption AROM to at least 120 deg with significantly improved compensatory shoulder hike for improved reaching and performance of overhead activities. Baseline:  Goal status: INITIAL  2.  Pt will be able to perform hair care without significant pain and difficulty.  Baseline:  Goal status: IN PROGRESS 8/27  3.  Pt will be able to reach overhead into a cabinet. Baseline:  Goal status: IN PROGRESS 8/27  4.  Pt will report she is able to perform bathing and dressing without significant difficulty.  Baseline:  Goal status: MET 8/27  5.  Pt will report improved usage of L UE with household chores with reduced pain.  Baseline:  Goal status: MET 8/27   PLAN:  PT FREQUENCY: 2x/week  PT DURATION: 6 weeks  PLANNED INTERVENTIONS: Therapeutic exercises, Therapeutic activity, Neuromuscular re-education, Patient/Family education, Self Care, Joint mobilization, Aquatic Therapy, Dry Needling, Electrical stimulation, Spinal mobilization, Cryotherapy, Moist heat, Taping, Ultrasound, Manual therapy, and Re-evaluation  PLAN FOR NEXT SESSION: Cont with ROM, ther ex, and STW.       Riki Altes, PTA  09/05/22 12:36 PM

## 2022-09-07 ENCOUNTER — Other Ambulatory Visit (HOSPITAL_BASED_OUTPATIENT_CLINIC_OR_DEPARTMENT_OTHER): Payer: Self-pay | Admitting: Family Medicine

## 2022-09-09 ENCOUNTER — Ambulatory Visit (HOSPITAL_BASED_OUTPATIENT_CLINIC_OR_DEPARTMENT_OTHER): Payer: Managed Care, Other (non HMO) | Attending: Family Medicine | Admitting: Physical Therapy

## 2022-09-09 ENCOUNTER — Encounter (HOSPITAL_BASED_OUTPATIENT_CLINIC_OR_DEPARTMENT_OTHER): Payer: Self-pay | Admitting: Physical Therapy

## 2022-09-09 DIAGNOSIS — M6281 Muscle weakness (generalized): Secondary | ICD-10-CM | POA: Diagnosis present

## 2022-09-09 DIAGNOSIS — M25512 Pain in left shoulder: Secondary | ICD-10-CM | POA: Diagnosis present

## 2022-09-09 DIAGNOSIS — M25612 Stiffness of left shoulder, not elsewhere classified: Secondary | ICD-10-CM | POA: Insufficient documentation

## 2022-09-09 NOTE — Therapy (Addendum)
OUTPATIENT PHYSICAL THERAPY TREATMENT   Patient Name: Vicki Gonzales MRN: [id number] DOB:May 20, 1965, 57 y.o., female Today's Date: 09/10/2022  END OF SESSION:  PT End of Session - 09/09/2022    Visit Number 12   Number of Visits 22   Date for PT Re-Evaluation 10/21/2022   Authorization Type [employer]   PT Start Time  1156   PT Stop Time 1239   PT Time Calculation (min) 43 min   Activity Tolerance Patient tolerated treatment well   Behavior During Therapy  WFL for tasks assessed/performed             Past Medical History:  Diagnosis Date   ADHD    Anxiety    Carpal tunnel syndrome on right    Depression    HTN (hypertension)    Hypothyroidism    Migraines    Spondylosis    Stroke [facility])    Past Surgical History:  Procedure Laterality Date   ANTERIOR CRUCIATE LIGAMENT REPAIR Left 2021   APPENDECTOMY     BUBBLE STUDY  08/17/2020   Procedure: BUBBLE STUDY;  Surgeon: Jodelle Red, MD;  Location: [facility] ENDOSCOPY;  Service: Cardiovascular;;   FALSE ANEURYSM REPAIR Right 08/15/2020   Procedure: REPAIR RIGHT FEMORAL PSEUDOANEURYSM;  Surgeon: Nada Libman, MD;  Location: [facility] OR;  Service: Vascular;  Laterality: Right;   IR ANGIO INTRA EXTRACRAN SEL COM CAROTID INNOMINATE UNI L MOD SED  08/14/2020   IR ANGIO INTRA EXTRACRAN SEL INTERNAL CAROTID UNI R MOD SED  08/14/2020   IR ANGIO VERTEBRAL SEL SUBCLAVIAN INNOMINATE BILAT MOD SED  08/14/2020   IR CT HEAD LTD  08/14/2020   IR CT HEAD LTD  08/14/2020   IR INTRA CRAN STENT  08/14/2020   IR INTRAVSC STENT CERV CAROTID W/O EMB-PROT MOD SED INC ANGIO  08/14/2020   IR RADIOLOGIST EVAL & MGMT  09/24/2020   PATENT FORAMEN OVALE(PFO) CLOSURE N/A 10/24/2020   Procedure: PATENT FORAMEN OVALE (PFO) CLOSURE;  Surgeon: Tonny Bollman, MD;  Location: [facility] INVASIVE CV LAB;  Service: Cardiovascular;  Laterality: N/A;   RADIOLOGY WITH ANESTHESIA N/A 08/14/2020   Procedure: IR WITH ANESTHESIA;  Surgeon: Julieanne Cotton, MD;  Location: [facility] OR;   Service: Radiology;  Laterality: N/A;   TEE WITHOUT CARDIOVERSION N/A 08/17/2020   Procedure: TRANSESOPHAGEAL ECHOCARDIOGRAM (TEE);  Surgeon: Jodelle Red, MD;  Location: [facility] ENDOSCOPY;  Service: Cardiovascular;  Laterality: N/A;   TUBAL LIGATION     Patient Active Problem List   Diagnosis Date Noted   Shoulder injury 06/30/2022   Diabetes mellitus without complication (HCC) 02/09/2022   Obesity (BMI 30.0-34.9) 11/12/2021   Anxiety 04/10/2021   Decreased hearing of left ear 04/10/2021   Depression 01/02/2021   Essential hypertension 01/02/2021   Right middle cerebral artery stroke (HCC) 08/22/2020   Hypothyroidism    Hyperlipidemia    PFO (patent foramen ovale)    Internal carotid artery occlusion, right 08/14/2020   Acute right MCA stroke (HCC) 08/12/2020   Cerebrovascular accident (CVA) (HCC)     PCP: [name], [name], MD   REFERRING PROVIDER: [name], [name], MD   REFERRING DIAG: S49.92XA (ICD-10-CM) - Injury of left shoulder, initial encounter   THERAPY DIAG:  Muscle weakness (generalized)  Left shoulder pain, unspecified chronicity  Stiffness of left shoulder, not elsewhere classified  Rationale for Evaluation and Treatment: Rehabilitation  ONSET DATE: 06/25/2022  SUBJECTIVE:  SUBJECTIVE STATEMENT:  Pt denies any adverse effects after prior Rx.  "I did really well last week. Therapy is helping."  Pt is unable to put her hair up in a scrunchie.  Pt is limited with reaching and unable to reach overhead.  Pt reports improved pain overall.  Pt denies any recent functional improvement.    Hand dominance: Right  PERTINENT HISTORY: CVA in 08/22.  Pt had surgery including a stent-Pt still has residual numbness in L foot Patent foramen ovale closure 10/24/2020 Anxiety and  depression HTN, DM, and CTS on R L ACL repair 2021   PAIN:  Are you having pain? No NPRS:  0/10 current, 4.5/10 worst, 0/10 best Location:  L superior/lateral/post shoulder, medial biceps and distal L UE  PRECAUTIONS: Other: CVA in 08/22   WEIGHT BEARING RESTRICTIONS: No  FALLS:  Has patient fallen in last 6 months? No  LIVING ENVIRONMENT: Lives with: lives with their spouse Lives in: 2 story home Stairs: yes  OCCUPATION: Pt not working.  Pt volunteers at church.    PLOF: Independent.  Pt was able to perform all of her ADLs/IADLs and reaching activities independently without difficulty.    PATIENT GOALS:improved mobility and strength.    OBJECTIVE:   DIAGNOSTIC FINDINGS:  Shoulder x rays on 6/20: FINDINGS: No evidence for an acute fracture. No findings to suggest shoulder separation or dislocation. Degenerative changes are noted at the acromioclavicular joint. Cortical irregularity at the rotator cuff insertion of the humeral head is compatible with degenerative change.   IMPRESSION: Degenerative changes without acute bony findings.  Elbow x rays on 6/20: FINDINGS: No evidence for an acute fracture. No subluxation or dislocation. Degenerative spurring is seen on the coronoid process of the ulna. No fat pad elevation to suggest joint effusion.   IMPRESSION: Degenerative changes without acute bony findings.   No joint effusion.   TODAY'S TREATMENT:                                                                                                                                             Manual Therapy:   Pt received STM to biceps in supine. GHJ mobilizations inf and posterior glides grade II   Therapeutic Exercise: Pulleys in flexion and abduction [id number] Supine wand flexion 2x 10 reps  Active supine short arc flexion 1# 2x10 Active flexion in supine approx 8 reps Supine press up bilat 2# dowel, single arm x15 with 1#  UPPER EXTREMITY ROM:     AROM/PROM Right eval Left eval Left 9/3  Shoulder flexion 176 65 with pain/126 92 in standing / 140 AROM in supine / 120 PROM  Shoulder scaption 172 72 with pain 79 with min pain though more fatigue  Shoulder abduction 145 64 with pain/86 72 with pain  Shoulder adduction       Shoulder internal rotation 65 70   Shoulder external  rotation 74 55/62 72 AROM  Elbow flexion       Elbow extension       Wrist flexion       Wrist extension       Wrist ulnar deviation       Wrist radial deviation       Wrist pronation       Wrist supination       (Blank rows = not tested)  Pt has a compensatory shoulder hike on L with UE elevation   UPPER EXTREMITY MMT:   MMT Right eval Left eval Left 9/3  Shoulder flexion       Shoulder extension       Shoulder abduction       Shoulder adduction       Shoulder internal rotation 4/5 4+/5 4+/5  Shoulder external rotation 4/5 Unable to tolerate Unable to tolerate  Middle trapezius       Lower trapezius       Elbow flexion       Elbow extension       Wrist flexion       Wrist extension       Wrist ulnar deviation       Wrist radial deviation       Wrist pronation       Wrist supination       Grip strength (lbs)       (Blank rows = not tested)   SHOULDER SPECIAL TESTS:   ER Lag:  L: improved control though still has deficits Neer's Impingement test:  L: positive.   FOTO:  Initial/Current:  28/38.  Goal of 57.    PATIENT EDUCATION: Education details:  dx, exercise form and rationale, HEP, POC, objective findings, and relevant anatomy.  PT answered pt's questions.  Person educated: Patient Education method: Explanation, Demonstration, Tactile cues, Verbal cues, and Handouts Education comprehension: verbalized understanding, returned demonstration, verbal cues required, and tactile cues required  HOME EXERCISE PROGRAM: Access Code: [email or web address] URL: [email or web address] Date: 07/29/2022 Prepared by: Aaron Edelman  Exercises - Supine Shoulder Flexion AAROM   - 2 x daily - 7 x weekly - 1-2 sets - 10 reps - Seated Scapular Retraction  - 2 x daily - 7 x weekly - 2 sets - 10 reps - Seated Shoulder Flexion Towel Slide at Table Top  - 2 x daily - 7 x weekly - 1 sets - 5 reps  ASSESSMENT:  CLINICAL IMPRESSION: Pt denies any recent functional improvement though has improved pain overall.  Pt has improved current, best, and worst pain.  Pt continues to be limited with reaching activities and is unable to reach overhead.  She continues to have compensatory shoulder hike with UE elevation.  She is limited with performing hair care.  Pt demonstrates improved L shoulder ROM t/o.  Pt had no change in RTC strength.  She has deficits with ER lag test though has improved control than initially.  Pt demonstrates improvement in self perceived disability from 28 initially to 38 currently.  Pt is making progress toward goals--see below for progress.  Pt should benefit from cont skilled PT services to address ongoing goals and assist in restoring desired level of function.   OBJECTIVE IMPAIRMENTS: decreased activity tolerance, decreased ROM, decreased strength, hypomobility, impaired flexibility, impaired UE functional use, and pain.   ACTIVITY LIMITATIONS: carrying, lifting, bed mobility, bathing, dressing, reach over head, and hygiene/grooming  PARTICIPATION LIMITATIONS: meal prep, cleaning, laundry, shopping, and community activity  PERSONAL  FACTORS: 1 comorbidity: CVA in 2022  are also affecting patient's functional outcome.   REHAB POTENTIAL: Good  CLINICAL DECISION MAKING: Stable/uncomplicated  EVALUATION COMPLEXITY: Low   GOALS:  SHORT TERM GOALS: Target date:  08/19/2022  Pt will report at least a 25% improvement in functional usage of L UE with reduced pain. Baseline: Goal status: MET 8/27  2.  Pt will demo at least a 20-25 deg improvement in L shoulder flexion and scaption AROM for improved UE  elevation and reaching. Baseline:  Goal status: 50% MET  3.  Pt will be independent and compliant with HEP for improved pain, ROM, strength, and function.  Baseline:  Goal status: IN PROGRESS 8/27  4.  Pt will be able to use L UE with driving with increased ease. Baseline:  Goal status: MET 8/27 Target date:  08/26/2022    LONG TERM GOALS: Target date: 10/21/2022  Pt will demo improved flexion and scaption AROM to at least 120 deg with significantly improved compensatory shoulder hike for improved reaching and performance of overhead activities. Baseline:  Goal status: ONGOING  2.  Pt will be able to perform hair care without significant pain and difficulty.  Baseline:  Goal status: ONGOING  3.  Pt will be able to reach overhead into a cabinet. Baseline:  Goal status: ONGOING  4.  Pt will report she is able to perform bathing and dressing without significant difficulty.  Baseline:  Goal status: MET 8/27  5.  Pt will report improved usage of L UE with household chores with reduced pain.  Baseline:  Goal status: MET 8/27   PLAN:  PT FREQUENCY: 2x/week  PT DURATION: 5-6 weeks  PLANNED INTERVENTIONS: Therapeutic exercises, Therapeutic activity, Neuromuscular re-education, Patient/Family education, Self Care, Joint mobilization, Aquatic Therapy, Dry Needling, Electrical stimulation, Spinal mobilization, Cryotherapy, Moist heat, Taping, Ultrasound, Manual therapy, and Re-evaluation  PLAN FOR NEXT SESSION: Cont with ROM, ther ex, and STW.       Audie Clear III PT, DPT 09/10/22 10:05 PM

## 2022-09-12 ENCOUNTER — Encounter (HOSPITAL_BASED_OUTPATIENT_CLINIC_OR_DEPARTMENT_OTHER): Payer: Self-pay

## 2022-09-12 ENCOUNTER — Ambulatory Visit (HOSPITAL_BASED_OUTPATIENT_CLINIC_OR_DEPARTMENT_OTHER): Payer: Managed Care, Other (non HMO)

## 2022-09-12 DIAGNOSIS — M6281 Muscle weakness (generalized): Secondary | ICD-10-CM | POA: Diagnosis not present

## 2022-09-12 DIAGNOSIS — M25612 Stiffness of left shoulder, not elsewhere classified: Secondary | ICD-10-CM

## 2022-09-12 DIAGNOSIS — M25512 Pain in left shoulder: Secondary | ICD-10-CM

## 2022-09-12 NOTE — Therapy (Signed)
OUTPATIENT PHYSICAL THERAPY TREATMENT   Patient Name: Vicki Gonzales MRN: [id number] DOB:[birth year], 57 y.o., female Today's Date: 09/12/2022  END OF SESSION:  PT End of Session - 09/12/22 1326     Visit Number 13    Number of Visits 22    Date for PT Re-Evaluation 10/21/22    Authorization Type [employer]    PT Start Time 1145    PT Stop Time 1230    PT Time Calculation (min) 45 min    Activity Tolerance Patient tolerated treatment well    Behavior During Therapy WFL for tasks assessed/performed            PT End of Session - 09/09/2022    Visit Number 12   Number of Visits 22   Date for PT Re-Evaluation 10/21/2022   Authorization Type [employer]   PT Start Time  1156   PT Stop Time 1239   PT Time Calculation (min) 43 min   Activity Tolerance Patient tolerated treatment well   Behavior During Therapy  WFL for tasks assessed/performed             Past Medical History:  Diagnosis Date   ADHD    Anxiety    Carpal tunnel syndrome on right    Depression    HTN (hypertension)    Hypothyroidism    Migraines    Spondylosis    Stroke [facility])    Past Surgical History:  Procedure Laterality Date   ANTERIOR CRUCIATE LIGAMENT REPAIR Left 2021   APPENDECTOMY     BUBBLE STUDY  08/17/2020   Procedure: BUBBLE STUDY;  Surgeon: Jodelle Red, MD;  Location: [facility] ENDOSCOPY;  Service: Cardiovascular;;   FALSE ANEURYSM REPAIR Right 08/15/2020   Procedure: REPAIR RIGHT FEMORAL PSEUDOANEURYSM;  Surgeon: Nada Libman, MD;  Location: [facility] OR;  Service: Vascular;  Laterality: Right;   IR ANGIO INTRA EXTRACRAN SEL COM CAROTID INNOMINATE UNI L MOD SED  08/14/2020   IR ANGIO INTRA EXTRACRAN SEL INTERNAL CAROTID UNI R MOD SED  08/14/2020   IR ANGIO VERTEBRAL SEL SUBCLAVIAN INNOMINATE BILAT MOD SED  08/14/2020   IR CT HEAD LTD  08/14/2020   IR CT HEAD LTD  08/14/2020   IR INTRA CRAN STENT  08/14/2020   IR INTRAVSC STENT CERV CAROTID W/O EMB-PROT MOD SED INC ANGIO  08/14/2020   IR  RADIOLOGIST EVAL & MGMT  09/24/2020   PATENT FORAMEN OVALE(PFO) CLOSURE N/A 10/24/2020   Procedure: PATENT FORAMEN OVALE (PFO) CLOSURE;  Surgeon: Tonny Bollman, MD;  Location: [facility] INVASIVE CV LAB;  Service: Cardiovascular;  Laterality: N/A;   RADIOLOGY WITH ANESTHESIA N/A 08/14/2020   Procedure: IR WITH ANESTHESIA;  Surgeon: Julieanne Cotton, MD;  Location: [facility] OR;  Service: Radiology;  Laterality: N/A;   TEE WITHOUT CARDIOVERSION N/A 08/17/2020   Procedure: TRANSESOPHAGEAL ECHOCARDIOGRAM (TEE);  Surgeon: Jodelle Red, MD;  Location: [facility] ENDOSCOPY;  Service: Cardiovascular;  Laterality: N/A;   TUBAL LIGATION     Patient Active Problem List   Diagnosis Date Noted   Shoulder injury 06/30/2022   Diabetes mellitus without complication (HCC) 02/09/2022   Obesity (BMI 30.0-34.9) 11/12/2021   Anxiety 04/10/2021   Decreased hearing of left ear 04/10/2021   Depression 01/02/2021   Essential hypertension 01/02/2021   Right middle cerebral artery stroke (HCC) 08/22/2020   Hypothyroidism    Hyperlipidemia    PFO (patent foramen ovale)    Internal carotid artery occlusion, right 08/14/2020   Acute right MCA stroke (HCC) 08/12/2020   Cerebrovascular  accident (CVA) [facility])     PCP: [name], Buren Kos, MD   REFERRING PROVIDER: [name], [name], MD   REFERRING DIAG: S49.92XA (ICD-10-CM) - Injury of left shoulder, initial encounter   THERAPY DIAG:  Muscle weakness (generalized)  Stiffness of left shoulder, not elsewhere classified  Left shoulder pain, unspecified chronicity  Rationale for Evaluation and Treatment: Rehabilitation  ONSET DATE: 06/25/2022  SUBJECTIVE:                                                                                                                                                                                      SUBJECTIVE STATEMENT:  Pt has stiffness in the morning. No pain at entry. Some R shoulder soreness from sleeping on it.    Hand  dominance: Right  PERTINENT HISTORY: CVA in 08/22.  Pt had surgery including a stent-Pt still has residual numbness in L foot Patent foramen ovale closure 10/24/2020 Anxiety and depression HTN, DM, and CTS on R L ACL repair 2021   PAIN:  Are you having pain? No NPRS:  0/10 current, 4.5/10 worst, 0/10 best Location:  L superior/lateral/post shoulder, medial biceps and distal L UE  PRECAUTIONS: Other: CVA in 08/22   WEIGHT BEARING RESTRICTIONS: No  FALLS:  Has patient fallen in last 6 months? No  LIVING ENVIRONMENT: Lives with: lives with their spouse Lives in: 2 story home Stairs: yes  OCCUPATION: Pt not working.  Pt volunteers at church.    PLOF: Independent.  Pt was able to perform all of her ADLs/IADLs and reaching activities independently without difficulty.    PATIENT GOALS:improved mobility and strength.    OBJECTIVE:   DIAGNOSTIC FINDINGS:  Shoulder x rays on 6/20: FINDINGS: No evidence for an acute fracture. No findings to suggest shoulder separation or dislocation. Degenerative changes are noted at the acromioclavicular joint. Cortical irregularity at the rotator cuff insertion of the humeral head is compatible with degenerative change.   IMPRESSION: Degenerative changes without acute bony findings.  Elbow x rays on 6/20: FINDINGS: No evidence for an acute fracture. No subluxation or dislocation. Degenerative spurring is seen on the coronoid process of the ulna. No fat pad elevation to suggest joint effusion.   IMPRESSION: Degenerative changes without acute bony findings.   No joint effusion.    UPPER EXTREMITY ROM:    AROM/PROM Right eval Left eval Left 9/3  Shoulder flexion 176 65 with pain/126 92 in standing / 140 AROM in supine / 120 PROM  Shoulder scaption 172 72 with pain 79 with min pain though more fatigue  Shoulder abduction 145 64 with pain/86 72 with pain  Shoulder adduction  Shoulder internal rotation 65 70   Shoulder  external rotation 74 55/62 72 AROM  Elbow flexion       Elbow extension       Wrist flexion       Wrist extension       Wrist ulnar deviation       Wrist radial deviation       Wrist pronation       Wrist supination       (Blank rows = not tested)  Pt has a compensatory shoulder hike on L with UE elevation   UPPER EXTREMITY MMT:   MMT Right eval Left eval Left 9/3  Shoulder flexion       Shoulder extension       Shoulder abduction       Shoulder adduction       Shoulder internal rotation 4/5 4+/5 4+/5  Shoulder external rotation 4/5 Unable to tolerate Unable to tolerate  Middle trapezius       Lower trapezius       Elbow flexion       Elbow extension       Wrist flexion       Wrist extension       Wrist ulnar deviation       Wrist radial deviation       Wrist pronation       Wrist supination       Grip strength (lbs)       (Blank rows = not tested)   SHOULDER SPECIAL TESTS:   ER Lag:  L: improved control though still has deficits Neer's Impingement test:  L: positive.   FOTO:  Initial/Current:  28/38.  Goal of 57.  TODAY'S TREATMENT:                                                                                                                                             Manual Therapy:   GHJ mobilizations inf and posterior glides grade II PROM L shoulder    Therapeutic Exercise: Pulleys in flexion and abduction [id number] Supine wand flexion 2x 10 reps with hold at end range 5 seconds Active supine short arc flexion 1# 2x10 TB row/ext GTB 2x15ea   PATIENT EDUCATION: Education details:  dx, exercise form and rationale, HEP, POC, objective findings, and relevant anatomy.  PT answered pt's questions.  Person educated: Patient Education method: Explanation, Demonstration, Tactile cues, Verbal cues, and Handouts Education comprehension: verbalized understanding, returned demonstration, verbal cues required, and tactile cues required  HOME EXERCISE  PROGRAM: Access Code: [email or web address] URL: [email or web address] Date: 07/29/2022 Prepared by: Aaron Edelman  Exercises - Supine Shoulder Flexion AAROM   - 2 x daily - 7 x weekly - 1-2 sets - 10 reps - Seated Scapular Retraction  - 2 x daily - 7 x weekly - 2 sets - 10 reps - Seated Shoulder Flexion  Towel Slide at Table Top  - 2 x daily - 7 x weekly - 1 sets - 5 reps  ASSESSMENT:  CLINICAL IMPRESSION: Pt with excellent tolerance for ROM interventions. Decreasing guarding with PROM, tohugh some still present. Pt reported good stretch from pulleys. Felt benefit from tband exercises. Some verbal cuing required for correction of UT hike with TB rows Will continue to progress as tolerated .  OBJECTIVE IMPAIRMENTS: decreased activity tolerance, decreased ROM, decreased strength, hypomobility, impaired flexibility, impaired UE functional use, and pain.   ACTIVITY LIMITATIONS: carrying, lifting, bed mobility, bathing, dressing, reach over head, and hygiene/grooming  PARTICIPATION LIMITATIONS: meal prep, cleaning, laundry, shopping, and community activity  PERSONAL FACTORS: 1 comorbidity: CVA in 2022  are also affecting patient's functional outcome.   REHAB POTENTIAL: Good  CLINICAL DECISION MAKING: Stable/uncomplicated  EVALUATION COMPLEXITY: Low   GOALS:  SHORT TERM GOALS: Target date:  08/19/2022  Pt will report at least a 25% improvement in functional usage of L UE with reduced pain. Baseline: Goal status: MET 8/27  2.  Pt will demo at least a 20-25 deg improvement in L shoulder flexion and scaption AROM for improved UE elevation and reaching. Baseline:  Goal status: 50% MET  3.  Pt will be independent and compliant with HEP for improved pain, ROM, strength, and function.  Baseline:  Goal status: IN PROGRESS 8/27  4.  Pt will be able to use L UE with driving with increased ease. Baseline:  Goal status: MET 8/27 Target date:  08/26/2022    LONG TERM GOALS: Target  date: 10/21/2022  Pt will demo improved flexion and scaption AROM to at least 120 deg with significantly improved compensatory shoulder hike for improved reaching and performance of overhead activities. Baseline:  Goal status: ONGOING  2.  Pt will be able to perform hair care without significant pain and difficulty.  Baseline:  Goal status: ONGOING  3.  Pt will be able to reach overhead into a cabinet. Baseline:  Goal status: ONGOING  4.  Pt will report she is able to perform bathing and dressing without significant difficulty.  Baseline:  Goal status: MET 8/27  5.  Pt will report improved usage of L UE with household chores with reduced pain.  Baseline:  Goal status: MET 8/27   PLAN:  PT FREQUENCY: 2x/week  PT DURATION: 5-6 weeks  PLANNED INTERVENTIONS: Therapeutic exercises, Therapeutic activity, Neuromuscular re-education, Patient/Family education, Self Care, Joint mobilization, Aquatic Therapy, Dry Needling, Electrical stimulation, Spinal mobilization, Cryotherapy, Moist heat, Taping, Ultrasound, Manual therapy, and Re-evaluation  PLAN FOR NEXT SESSION: Cont with ROM, ther ex, and STW.       Riki Altes, PTA  09/12/22 2:09 PM

## 2022-09-15 ENCOUNTER — Ambulatory Visit (HOSPITAL_BASED_OUTPATIENT_CLINIC_OR_DEPARTMENT_OTHER): Payer: 59 | Admitting: Family Medicine

## 2022-09-16 ENCOUNTER — Encounter (HOSPITAL_BASED_OUTPATIENT_CLINIC_OR_DEPARTMENT_OTHER): Payer: Self-pay | Admitting: Family Medicine

## 2022-09-16 ENCOUNTER — Ambulatory Visit (INDEPENDENT_AMBULATORY_CARE_PROVIDER_SITE_OTHER): Payer: Managed Care, Other (non HMO) | Admitting: Family Medicine

## 2022-09-16 ENCOUNTER — Ambulatory Visit (HOSPITAL_BASED_OUTPATIENT_CLINIC_OR_DEPARTMENT_OTHER): Payer: Managed Care, Other (non HMO) | Admitting: Physical Therapy

## 2022-09-16 VITALS — BP 140/88 | HR 61 | Temp 97.7°F | Ht 66.0 in | Wt 202.8 lb

## 2022-09-16 DIAGNOSIS — E039 Hypothyroidism, unspecified: Secondary | ICD-10-CM

## 2022-09-16 DIAGNOSIS — Z23 Encounter for immunization: Secondary | ICD-10-CM

## 2022-09-16 DIAGNOSIS — M6281 Muscle weakness (generalized): Secondary | ICD-10-CM

## 2022-09-16 DIAGNOSIS — S4992XD Unspecified injury of left shoulder and upper arm, subsequent encounter: Secondary | ICD-10-CM | POA: Diagnosis not present

## 2022-09-16 DIAGNOSIS — E119 Type 2 diabetes mellitus without complications: Secondary | ICD-10-CM

## 2022-09-16 DIAGNOSIS — M25612 Stiffness of left shoulder, not elsewhere classified: Secondary | ICD-10-CM

## 2022-09-16 DIAGNOSIS — M25512 Pain in left shoulder: Secondary | ICD-10-CM

## 2022-09-16 NOTE — Assessment & Plan Note (Addendum)
Hemoglobin A1c was at goal on most recent labs little less than 3 months ago.  No reported issues with polyuria or polydipsia.  She continues with metformin, indicates that she has been tolerating medication well.  Denies any GI symptoms or side effects. Urine microalbumin/creatinine ratio completed at last office visit and was normal Foot exam completed today We will continue with metformin at current dose, no changes made today.  Will plan to check hemoglobin A1c around time of next office visit for monitoring

## 2022-09-16 NOTE — Therapy (Signed)
OUTPATIENT PHYSICAL THERAPY TREATMENT   Patient Name: Vicki Gonzales MRN: [id number] DOB:[birth year], 57 y.o., female Today's Date: 09/17/2022  END OF SESSION:   PT End of Session - 09/16/2022    Visit Number 14   Number of Visits 22   Date for PT Re-Evaluation 10/21/2022   Authorization Type [employer]   PT Start Time  1153   PT Stop Time 1232   PT Time Calculation (min) 39 min   Activity Tolerance Patient tolerated treatment well   Behavior During Therapy  WFL for tasks assessed/performed             Past Medical History:  Diagnosis Date   ADHD    Anxiety    Carpal tunnel syndrome on right    Depression    HTN (hypertension)    Hypothyroidism    Migraines    Spondylosis    Stroke [facility])    Past Surgical History:  Procedure Laterality Date   ANTERIOR CRUCIATE LIGAMENT REPAIR Left 2021   APPENDECTOMY     BUBBLE STUDY  08/17/2020   Procedure: BUBBLE STUDY;  Surgeon: Jodelle Red, MD;  Location: [facility] ENDOSCOPY;  Service: Cardiovascular;;   FALSE ANEURYSM REPAIR Right 08/15/2020   Procedure: REPAIR RIGHT FEMORAL PSEUDOANEURYSM;  Surgeon: Nada Libman, MD;  Location: [facility] OR;  Service: Vascular;  Laterality: Right;   IR ANGIO INTRA EXTRACRAN SEL COM CAROTID INNOMINATE UNI L MOD SED  08/14/2020   IR ANGIO INTRA EXTRACRAN SEL INTERNAL CAROTID UNI R MOD SED  08/14/2020   IR ANGIO VERTEBRAL SEL SUBCLAVIAN INNOMINATE BILAT MOD SED  08/14/2020   IR CT HEAD LTD  08/14/2020   IR CT HEAD LTD  08/14/2020   IR INTRA CRAN STENT  08/14/2020   IR INTRAVSC STENT CERV CAROTID W/O EMB-PROT MOD SED INC ANGIO  08/14/2020   IR RADIOLOGIST EVAL & MGMT  09/24/2020   PATENT FORAMEN OVALE(PFO) CLOSURE N/A 10/24/2020   Procedure: PATENT FORAMEN OVALE (PFO) CLOSURE;  Surgeon: Tonny Bollman, MD;  Location: [facility] INVASIVE CV LAB;  Service: Cardiovascular;  Laterality: N/A;   RADIOLOGY WITH ANESTHESIA N/A 08/14/2020   Procedure: IR WITH ANESTHESIA;  Surgeon: Julieanne Cotton, MD;  Location: [facility]  OR;  Service: Radiology;  Laterality: N/A;   TEE WITHOUT CARDIOVERSION N/A 08/17/2020   Procedure: TRANSESOPHAGEAL ECHOCARDIOGRAM (TEE);  Surgeon: Jodelle Red, MD;  Location: [facility] ENDOSCOPY;  Service: Cardiovascular;  Laterality: N/A;   TUBAL LIGATION     Patient Active Problem List   Diagnosis Date Noted   Shoulder injury 06/30/2022   Diabetes mellitus without complication (HCC) 02/09/2022   Obesity (BMI 30.0-34.9) 11/12/2021   Anxiety 04/10/2021   Decreased hearing of left ear 04/10/2021   Depression 01/02/2021   Essential hypertension 01/02/2021   Right middle cerebral artery stroke (HCC) 08/22/2020   Hypothyroidism    Hyperlipidemia    PFO (patent foramen ovale)    Internal carotid artery occlusion, right 08/14/2020   Acute right MCA stroke (HCC) 08/12/2020   Cerebrovascular accident (CVA) (HCC)     PCP: [name], [name], MD   REFERRING PROVIDER: [name], [name], MD   REFERRING DIAG: S49.92XA (ICD-10-CM) - Injury of left shoulder, initial encounter   THERAPY DIAG:  Muscle weakness (generalized)  Stiffness of left shoulder, not elsewhere classified  Left shoulder pain, unspecified chronicity  Rationale for Evaluation and Treatment: Rehabilitation  ONSET DATE: 06/25/2022  SUBJECTIVE:  SUBJECTIVE STATEMENT:  Pt denies any adverse effects after prior Rx.  Pt denies pain currently.   Hand dominance: Right  PERTINENT HISTORY: CVA in 08/22.  Pt had surgery including a stent-Pt still has residual numbness in L foot Patent foramen ovale closure 10/24/2020 Anxiety and depression HTN, DM, and CTS on R L ACL repair 2021   PAIN:  Are you having pain? No NPRS:  0/10 current, 4.5/10 worst, 0/10 best Location:  L superior/lateral/post shoulder, medial biceps and distal L  UE  PRECAUTIONS: Other: CVA in 08/22   WEIGHT BEARING RESTRICTIONS: No  FALLS:  Has patient fallen in last 6 months? No  LIVING ENVIRONMENT: Lives with: lives with their spouse Lives in: 2 story home Stairs: yes  OCCUPATION: Pt not working.  Pt volunteers at church.    PLOF: Independent.  Pt was able to perform all of her ADLs/IADLs and reaching activities independently without difficulty.    PATIENT GOALS:improved mobility and strength.    OBJECTIVE:   DIAGNOSTIC FINDINGS:  Shoulder x rays on 6/20: FINDINGS: No evidence for an acute fracture. No findings to suggest shoulder separation or dislocation. Degenerative changes are noted at the acromioclavicular joint. Cortical irregularity at the rotator cuff insertion of the humeral head is compatible with degenerative change.   IMPRESSION: Degenerative changes without acute bony findings.  Elbow x rays on 6/20: FINDINGS: No evidence for an acute fracture. No subluxation or dislocation. Degenerative spurring is seen on the coronoid process of the ulna. No fat pad elevation to suggest joint effusion.   IMPRESSION: Degenerative changes without acute bony findings.   No joint effusion.    TODAY'S TREATMENT:                                                                                                                                           Manual Therapy:   GHJ mobilizations inf and posterior glides grade II PROM L shoulder in flex, scaption, ER, and IR   Therapeutic Exercise: Pulleys in flexion and abduction [id number] Supine wand flexion x 10 reps with hold at end range 5 seconds Active supine short arc flexion 1# 2x10 Unable to perform S/L ER correctly Supine ER AROM 2x10 Supine serratus punch x 10 reps and x 5 reps TB row/ext GTB x10ea   PATIENT EDUCATION: Education details:  dx, exercise form and rationale, HEP, POC, and relevant anatomy.  PT answered pt's questions.  Person educated:  Patient Education method: Explanation, Demonstration, Tactile cues, Verbal cues, and Handouts Education comprehension: verbalized understanding, returned demonstration, verbal cues required, and tactile cues required  HOME EXERCISE PROGRAM: Access Code: [email or web address] URL: [email or web address] Date: 07/29/2022 Prepared by: Aaron Edelman  Exercises - Supine Shoulder Flexion AAROM   - 2 x daily - 7 x weekly - 1-2 sets - 10 reps - Seated Scapular Retraction  - 2 x daily - 7 x weekly - 2 sets -  10 reps - Seated Shoulder Flexion Towel Slide at Table Top  - 2 x daily - 7 x weekly - 1 sets - 5 reps  ASSESSMENT:  CLINICAL IMPRESSION: Pt is progressing with tolerance for exercises.  She has decreased guarding with PROM though continues to have some guarding.  Pt has improved elbow extension ROM.  She continues to be limited with UE elevation.  Pt performed exercises well with cuing and instruction in correct form.  Attempted S/L ER though pt unable to perform correctly.  Pt able to perform supine ER AROM.  Pt responded well to Rx having no c/o's after Rx.   OBJECTIVE IMPAIRMENTS: decreased activity tolerance, decreased ROM, decreased strength, hypomobility, impaired flexibility, impaired UE functional use, and pain.   ACTIVITY LIMITATIONS: carrying, lifting, bed mobility, bathing, dressing, reach over head, and hygiene/grooming  PARTICIPATION LIMITATIONS: meal prep, cleaning, laundry, shopping, and community activity  PERSONAL FACTORS: 1 comorbidity: CVA in 2022  are also affecting patient's functional outcome.   REHAB POTENTIAL: Good  CLINICAL DECISION MAKING: Stable/uncomplicated  EVALUATION COMPLEXITY: Low   GOALS:  SHORT TERM GOALS: Target date:  08/19/2022  Pt will report at least a 25% improvement in functional usage of L UE with reduced pain. Baseline: Goal status: MET 8/27  2.  Pt will demo at least a 20-25 deg improvement in L shoulder flexion and scaption AROM for  improved UE elevation and reaching. Baseline:  Goal status: 50% MET  3.  Pt will be independent and compliant with HEP for improved pain, ROM, strength, and function.  Baseline:  Goal status: IN PROGRESS 8/27  4.  Pt will be able to use L UE with driving with increased ease. Baseline:  Goal status: MET 8/27 Target date:  08/26/2022    LONG TERM GOALS: Target date: 10/21/2022  Pt will demo improved flexion and scaption AROM to at least 120 deg with significantly improved compensatory shoulder hike for improved reaching and performance of overhead activities. Baseline:  Goal status: ONGOING  2.  Pt will be able to perform hair care without significant pain and difficulty.  Baseline:  Goal status: ONGOING  3.  Pt will be able to reach overhead into a cabinet. Baseline:  Goal status: ONGOING  4.  Pt will report she is able to perform bathing and dressing without significant difficulty.  Baseline:  Goal status: MET 8/27  5.  Pt will report improved usage of L UE with household chores with reduced pain.  Baseline:  Goal status: MET 8/27   PLAN:  PT FREQUENCY: 2x/week  PT DURATION: 5-6 weeks  PLANNED INTERVENTIONS: Therapeutic exercises, Therapeutic activity, Neuromuscular re-education, Patient/Family education, Self Care, Joint mobilization, Aquatic Therapy, Dry Needling, Electrical stimulation, Spinal mobilization, Cryotherapy, Moist heat, Taping, Ultrasound, Manual therapy, and Re-evaluation  PLAN FOR NEXT SESSION: Cont with ROM, ther ex, jt mobs, and STW.       Audie Clear III PT, DPT 09/17/22 11:14 PM

## 2022-09-16 NOTE — Progress Notes (Signed)
    Procedures performed today:    None.  Independent interpretation of notes and tests performed by another provider:   None.  Brief History, Exam, Impression, and Recommendations:    BP (!) 140/88 (BP Location: Left Arm, Cuff Size: Large)   Pulse 61   Temp 97.7 F (36.5 C)   Ht 5\' 6"  (1.676 m)   Wt 202 lb 12.8 oz (92 kg)   LMP 04/07/2019 (Approximate)   SpO2 99%   BMI 32.73 kg/m   Need for influenza vaccination  Diabetes mellitus without complication (HCC) Assessment & Plan: Hemoglobin A1c was at goal on most recent labs little less than 3 months ago.  No reported issues with polyuria or polydipsia.  She continues with metformin, indicates that she has been tolerating medication well.  Denies any GI symptoms or side effects. Urine microalbumin/creatinine ratio completed at last office visit and was normal Foot exam completed today We will continue with metformin at current dose, no changes made today.  Will plan to check hemoglobin A1c around time of next office visit for monitoring   Injury of left shoulder, subsequent encounter Assessment & Plan: Patient reports that she has been doing well with physical therapy, has liked working with physical therapist here in the office downstairs.  She has also been working on home exercise program as per PT.  Feels that she has been gradually improving since injury. Given progress, can continue with PT.  Will continue to monitor progress moving forward to determine if any further evaluation or treatment is required   Hypothyroidism, unspecified type Assessment & Plan: Patient has been taking slightly higher dose of levothyroxine at 100 mcg.  Clinically, patient is euthyroid.  She is due for recheck of TSH at this time in order to determine response to dose change.  We will check TSH today and proceed with medication adjustment as needed  Orders: -     TSH  Encounter for immunization -     Flu vaccine trivalent PF, 6mos and  older(Flulaval,Afluria,Fluarix,Fluzone)  Return in about 3 months (around 12/16/2022) for diabetes, hypertension.   ___________________________________________ Quintana Canelo [name], MD, ABFM, CAQSM Primary Care and Sports [facility]

## 2022-09-16 NOTE — Assessment & Plan Note (Signed)
Patient has been taking slightly higher dose of levothyroxine at 100 mcg.  Clinically, patient is euthyroid.  She is due for recheck of TSH at this time in order to determine response to dose change.  We will check TSH today and proceed with medication adjustment as needed

## 2022-09-16 NOTE — Assessment & Plan Note (Signed)
Patient reports that she has been doing well with physical therapy, has liked working with physical therapist here in the office downstairs.  She has also been working on home exercise program as per PT.  Feels that she has been gradually improving since injury. Given progress, can continue with PT.  Will continue to monitor progress moving forward to determine if any further evaluation or treatment is required

## 2022-09-17 ENCOUNTER — Encounter (HOSPITAL_BASED_OUTPATIENT_CLINIC_OR_DEPARTMENT_OTHER): Payer: Self-pay | Admitting: Physical Therapy

## 2022-09-17 LAB — TSH: TSH: 0.991 u[IU]/mL (ref 0.450–4.500)

## 2022-09-18 ENCOUNTER — Other Ambulatory Visit (HOSPITAL_BASED_OUTPATIENT_CLINIC_OR_DEPARTMENT_OTHER): Payer: Self-pay | Admitting: Family Medicine

## 2022-09-18 DIAGNOSIS — E039 Hypothyroidism, unspecified: Secondary | ICD-10-CM

## 2022-09-19 ENCOUNTER — Ambulatory Visit (HOSPITAL_BASED_OUTPATIENT_CLINIC_OR_DEPARTMENT_OTHER): Payer: Managed Care, Other (non HMO) | Admitting: Physical Therapy

## 2022-09-19 ENCOUNTER — Encounter (HOSPITAL_BASED_OUTPATIENT_CLINIC_OR_DEPARTMENT_OTHER): Payer: Self-pay | Admitting: Physical Therapy

## 2022-09-19 DIAGNOSIS — M25612 Stiffness of left shoulder, not elsewhere classified: Secondary | ICD-10-CM

## 2022-09-19 DIAGNOSIS — M25512 Pain in left shoulder: Secondary | ICD-10-CM

## 2022-09-19 DIAGNOSIS — M6281 Muscle weakness (generalized): Secondary | ICD-10-CM | POA: Diagnosis not present

## 2022-09-19 NOTE — Therapy (Signed)
OUTPATIENT PHYSICAL THERAPY TREATMENT   Patient Name: Vicki Gonzales MRN: [id number] DOB:1965-07-05, 57 y.o., female Today's Date: 09/19/2022  END OF SESSION:  PT End of Session - 09/19/22 1113     Visit Number 15    Number of Visits 22    Date for PT Re-Evaluation 10/21/22    Authorization Type [employer]    PT Start Time 1108    PT Stop Time 1150    PT Time Calculation (min) 42 min    Activity Tolerance Patient tolerated treatment well    Behavior During Therapy WFL for tasks assessed/performed                        Past Medical History:  Diagnosis Date   ADHD    Anxiety    Carpal tunnel syndrome on right    Depression    HTN (hypertension)    Hypothyroidism    Migraines    Spondylosis    Stroke [facility])    Past Surgical History:  Procedure Laterality Date   ANTERIOR CRUCIATE LIGAMENT REPAIR Left 2021   APPENDECTOMY     BUBBLE STUDY  08/17/2020   Procedure: BUBBLE STUDY;  Surgeon: Jodelle Red, MD;  Location: [facility] ENDOSCOPY;  Service: Cardiovascular;;   FALSE ANEURYSM REPAIR Right 08/15/2020   Procedure: REPAIR RIGHT FEMORAL PSEUDOANEURYSM;  Surgeon: Nada Libman, MD;  Location: [facility] OR;  Service: Vascular;  Laterality: Right;   IR ANGIO INTRA EXTRACRAN SEL COM CAROTID INNOMINATE UNI L MOD SED  08/14/2020   IR ANGIO INTRA EXTRACRAN SEL INTERNAL CAROTID UNI R MOD SED  08/14/2020   IR ANGIO VERTEBRAL SEL SUBCLAVIAN INNOMINATE BILAT MOD SED  08/14/2020   IR CT HEAD LTD  08/14/2020   IR CT HEAD LTD  08/14/2020   IR INTRA CRAN STENT  08/14/2020   IR INTRAVSC STENT CERV CAROTID W/O EMB-PROT MOD SED INC ANGIO  08/14/2020   IR RADIOLOGIST EVAL & MGMT  09/24/2020   PATENT FORAMEN OVALE(PFO) CLOSURE N/A 10/24/2020   Procedure: PATENT FORAMEN OVALE (PFO) CLOSURE;  Surgeon: Tonny Bollman, MD;  Location: [facility] INVASIVE CV LAB;  Service: Cardiovascular;  Laterality: N/A;   RADIOLOGY WITH ANESTHESIA N/A 08/14/2020   Procedure: IR WITH ANESTHESIA;  Surgeon: Julieanne Cotton, MD;  Location: [facility] OR;  Service: Radiology;  Laterality: N/A;   TEE WITHOUT CARDIOVERSION N/A 08/17/2020   Procedure: TRANSESOPHAGEAL ECHOCARDIOGRAM (TEE);  Surgeon: Jodelle Red, MD;  Location: [facility] ENDOSCOPY;  Service: Cardiovascular;  Laterality: N/A;   TUBAL LIGATION     Patient Active Problem List   Diagnosis Date Noted   Shoulder injury 06/30/2022   Diabetes mellitus without complication (HCC) 02/09/2022   Obesity (BMI 30.0-34.9) 11/12/2021   Anxiety 04/10/2021   Decreased hearing of left ear 04/10/2021   Depression 01/02/2021   Essential hypertension 01/02/2021   Right middle cerebral artery stroke (HCC) 08/22/2020   Hypothyroidism    Hyperlipidemia    PFO (patent foramen ovale)    Internal carotid artery occlusion, right 08/14/2020   Acute right MCA stroke (HCC) 08/12/2020   Cerebrovascular accident (CVA) (HCC)     PCP: [name], [name], MD   REFERRING PROVIDER: [name], [name], MD   REFERRING DIAG: S49.92XA (ICD-10-CM) - Injury of left shoulder, initial encounter   THERAPY DIAG:  Muscle weakness (generalized)  Stiffness of left shoulder, not elsewhere classified  Left shoulder pain, unspecified chronicity  Rationale for Evaluation and Treatment: Rehabilitation  ONSET DATE: 06/25/2022  SUBJECTIVE:                                                                                                                                                                                      SUBJECTIVE STATEMENT:  Pt denies any adverse effects after prior Rx.  Pt denies pain currently.   Hand dominance: Right  PERTINENT HISTORY: CVA in 08/22.  Pt had surgery including a stent-Pt still has residual numbness in L foot Patent foramen ovale closure 10/24/2020 Anxiety and depression HTN, DM, and CTS on R L ACL repair 2021   PAIN:  Are you having pain? No NPRS:  0/10 current, 4.5/10 worst, 0/10 best Location:  L superior/lateral/post shoulder, medial  biceps and distal L UE  PRECAUTIONS: Other: CVA in 08/22   WEIGHT BEARING RESTRICTIONS: No  FALLS:  Has patient fallen in last 6 months? No  LIVING ENVIRONMENT: Lives with: lives with their spouse Lives in: 2 story home Stairs: yes  OCCUPATION: Pt not working.  Pt volunteers at church.    PLOF: Independent.  Pt was able to perform all of her ADLs/IADLs and reaching activities independently without difficulty.    PATIENT GOALS:improved mobility and strength.    OBJECTIVE:   DIAGNOSTIC FINDINGS:  Shoulder x rays on 6/20: FINDINGS: No evidence for an acute fracture. No findings to suggest shoulder separation or dislocation. Degenerative changes are noted at the acromioclavicular joint. Cortical irregularity at the rotator cuff insertion of the humeral head is compatible with degenerative change.   IMPRESSION: Degenerative changes without acute bony findings.  Elbow x rays on 6/20: FINDINGS: No evidence for an acute fracture. No subluxation or dislocation. Degenerative spurring is seen on the coronoid process of the ulna. No fat pad elevation to suggest joint effusion.   IMPRESSION: Degenerative changes without acute bony findings.   No joint effusion.    TODAY'S TREATMENT:  Manual Therapy:   GHJ mobilizations inf and posterior glides grade II PROM L shoulder in flex, scaption, ER, and IR   Therapeutic Exercise: Pulleys in flexion, scaption, and abduction  Active supine short arc flexion and supine flexion x10 each with 1# Supine ER AROM 2x10 Supine serratus punch x 10 reps and x 5 reps Supine shoulder ABC x 1 rep TB row/ext GTB 2x10ea Shoulder ER submax isometrics with manual resistance x 5 reps with 5 sec hold   PATIENT EDUCATION: Education details:  dx, exercise form and rationale, HEP, POC, and relevant anatomy.   PT answered pt's questions.  Person educated: Patient Education method: Explanation, Demonstration, Tactile cues, Verbal cues Education comprehension: verbalized understanding, returned demonstration, verbal cues required, and tactile cues required  HOME EXERCISE PROGRAM: Access Code: [email or web address] URL: [email or web address] Date: 07/29/2022 Prepared by: Aaron Edelman  Exercises - Supine Shoulder Flexion AAROM   - 2 x daily - 7 x weekly - 1-2 sets - 10 reps - Seated Scapular Retraction  - 2 x daily - 7 x weekly - 2 sets - 10 reps - Seated Shoulder Flexion Towel Slide at Table Top  - 2 x daily - 7 x weekly - 1 sets - 5 reps  ASSESSMENT:  CLINICAL IMPRESSION: Pt continues to have tightness in shoulder ROM.  Overall pt has decreased guarding though continues to have guarding with PROM.  She continues to be limited with UE elevation.  Pt performed exercises well and seems to be improving with strength as evidenced by performance of exercises.  Pt has weakness in ER.  Pt unable to perform standing ER with YTB with correct form without compensation, but was able to tolerate submaximal ER isometrics.  Pt responded well to Rx having no c/o's and no pain after Rx.   OBJECTIVE IMPAIRMENTS: decreased activity tolerance, decreased ROM, decreased strength, hypomobility, impaired flexibility, impaired UE functional use, and pain.   ACTIVITY LIMITATIONS: carrying, lifting, bed mobility, bathing, dressing, reach over head, and hygiene/grooming  PARTICIPATION LIMITATIONS: meal prep, cleaning, laundry, shopping, and community activity  PERSONAL FACTORS: 1 comorbidity: CVA in 2022  are also affecting patient's functional outcome.   REHAB POTENTIAL: Good  CLINICAL DECISION MAKING: Stable/uncomplicated  EVALUATION COMPLEXITY: Low   GOALS:  SHORT TERM GOALS: Target date:  08/19/2022  Pt will report at least a 25% improvement in functional usage of L UE with reduced pain. Baseline: Goal  status: MET 8/27  2.  Pt will demo at least a 20-25 deg improvement in L shoulder flexion and scaption AROM for improved UE elevation and reaching. Baseline:  Goal status: 50% MET  3.  Pt will be independent and compliant with HEP for improved pain, ROM, strength, and function.  Baseline:  Goal status: IN PROGRESS 8/27  4.  Pt will be able to use L UE with driving with increased ease. Baseline:  Goal status: MET 8/27 Target date:  08/26/2022    LONG TERM GOALS: Target date: 10/21/2022  Pt will demo improved flexion and scaption AROM to at least 120 deg with significantly improved compensatory shoulder hike for improved reaching and performance of overhead activities. Baseline:  Goal status: ONGOING  2.  Pt will be able to perform hair care without significant pain and difficulty.  Baseline:  Goal status: ONGOING  3.  Pt will be able to reach overhead into a cabinet. Baseline:  Goal status: ONGOING  4.  Pt will report she is able to perform bathing and dressing without significant difficulty.  Baseline:  Goal status: MET 8/27  5.  Pt will report improved usage of L UE with household chores with reduced pain.  Baseline:  Goal status: MET 8/27   PLAN:  PT FREQUENCY: 2x/week  PT DURATION: 5-6 weeks  PLANNED INTERVENTIONS: Therapeutic exercises, Therapeutic activity, Neuromuscular re-education, Patient/Family education, Self Care, Joint mobilization, Aquatic Therapy, Dry Needling, Electrical stimulation, Spinal mobilization, Cryotherapy, Moist heat, Taping, Ultrasound, Manual therapy, and Re-evaluation  PLAN FOR NEXT SESSION: Cont with ROM, ther ex, jt mobs, and STW.       Audie Clear III PT, DPT 09/19/22 11:17 PM

## 2022-10-06 ENCOUNTER — Encounter (HOSPITAL_BASED_OUTPATIENT_CLINIC_OR_DEPARTMENT_OTHER): Payer: Self-pay

## 2022-10-06 ENCOUNTER — Other Ambulatory Visit (HOSPITAL_BASED_OUTPATIENT_CLINIC_OR_DEPARTMENT_OTHER): Payer: Self-pay | Admitting: Family Medicine

## 2022-10-06 ENCOUNTER — Ambulatory Visit (HOSPITAL_BASED_OUTPATIENT_CLINIC_OR_DEPARTMENT_OTHER): Payer: Managed Care, Other (non HMO)

## 2022-10-06 DIAGNOSIS — M25512 Pain in left shoulder: Secondary | ICD-10-CM

## 2022-10-06 DIAGNOSIS — M6281 Muscle weakness (generalized): Secondary | ICD-10-CM | POA: Diagnosis not present

## 2022-10-06 DIAGNOSIS — M25612 Stiffness of left shoulder, not elsewhere classified: Secondary | ICD-10-CM

## 2022-10-06 NOTE — Therapy (Signed)
OUTPATIENT PHYSICAL THERAPY TREATMENT   Patient Name: Vicki Gonzales MRN: [id number] DOB:[birth year], 57 y.o., female Today's Date: 10/06/2022  END OF SESSION:  PT End of Session - 10/06/22 1355     Visit Number 16    Number of Visits 22    Date for PT Re-Evaluation 10/21/22    Authorization Type [employer]    PT Start Time 1303    PT Stop Time 1345    PT Time Calculation (min) 42 min    Activity Tolerance Patient tolerated treatment well    Behavior During Therapy WFL for tasks assessed/performed                         Past Medical History:  Diagnosis Date   ADHD    Anxiety    Carpal tunnel syndrome on right    Depression    HTN (hypertension)    Hypothyroidism    Migraines    Spondylosis    Stroke [facility])    Past Surgical History:  Procedure Laterality Date   ANTERIOR CRUCIATE LIGAMENT REPAIR Left 2021   APPENDECTOMY     BUBBLE STUDY  08/17/2020   Procedure: BUBBLE STUDY;  Surgeon: Jodelle Red, MD;  Location: [facility] ENDOSCOPY;  Service: Cardiovascular;;   FALSE ANEURYSM REPAIR Right 08/15/2020   Procedure: REPAIR RIGHT FEMORAL PSEUDOANEURYSM;  Surgeon: Nada Libman, MD;  Location: [facility] OR;  Service: Vascular;  Laterality: Right;   IR ANGIO INTRA EXTRACRAN SEL COM CAROTID INNOMINATE UNI L MOD SED  08/14/2020   IR ANGIO INTRA EXTRACRAN SEL INTERNAL CAROTID UNI R MOD SED  08/14/2020   IR ANGIO VERTEBRAL SEL SUBCLAVIAN INNOMINATE BILAT MOD SED  08/14/2020   IR CT HEAD LTD  08/14/2020   IR CT HEAD LTD  08/14/2020   IR INTRA CRAN STENT  08/14/2020   IR INTRAVSC STENT CERV CAROTID W/O EMB-PROT MOD SED INC ANGIO  08/14/2020   IR RADIOLOGIST EVAL & MGMT  09/24/2020   PATENT FORAMEN OVALE(PFO) CLOSURE N/A 10/24/2020   Procedure: PATENT FORAMEN OVALE (PFO) CLOSURE;  Surgeon: Tonny Bollman, MD;  Location: [facility] INVASIVE CV LAB;  Service: Cardiovascular;  Laterality: N/A;   RADIOLOGY WITH ANESTHESIA N/A 08/14/2020   Procedure: IR WITH ANESTHESIA;  Surgeon: Julieanne Cotton, MD;  Location: [facility] OR;  Service: Radiology;  Laterality: N/A;   TEE WITHOUT CARDIOVERSION N/A 08/17/2020   Procedure: TRANSESOPHAGEAL ECHOCARDIOGRAM (TEE);  Surgeon: Jodelle Red, MD;  Location: [facility] ENDOSCOPY;  Service: Cardiovascular;  Laterality: N/A;   TUBAL LIGATION     Patient Active Problem List   Diagnosis Date Noted   Shoulder injury 06/30/2022   Diabetes mellitus without complication (HCC) 02/09/2022   Obesity (BMI 30.0-34.9) 11/12/2021   Anxiety 04/10/2021   Decreased hearing of left ear 04/10/2021   Depression 01/02/2021   Essential hypertension 01/02/2021   Right middle cerebral artery stroke (HCC) 08/22/2020   Hypothyroidism    Hyperlipidemia    PFO (patent foramen ovale)    Internal carotid artery occlusion, right 08/14/2020   Acute right MCA stroke (HCC) 08/12/2020   Cerebrovascular accident (CVA) (HCC)     PCP: [name], [name], MD   REFERRING PROVIDER: [name], [name], MD   REFERRING DIAG: S49.92XA (ICD-10-CM) - Injury of left shoulder, initial encounter   THERAPY DIAG:  Stiffness of left shoulder, not elsewhere classified  Muscle weakness (generalized)  Left shoulder pain, unspecified chronicity  Rationale for Evaluation and Treatment: Rehabilitation  ONSET DATE: 06/25/2022  SUBJECTIVE:                                                                                                                                                                                      SUBJECTIVE STATEMENT:  Pt states she has been busy and has not been doing many exercises. Miss 2 weeks of PT due to the schedule being full.    Hand dominance: Right  PERTINENT HISTORY: CVA in 08/22.  Pt had surgery including a stent-Pt still has residual numbness in L foot Patent foramen ovale closure 10/24/2020 Anxiety and depression HTN, DM, and CTS on R L ACL repair 2021   PAIN:  Are you having pain? No NPRS:  0/10 current, 4.5/10 worst, 0/10  best Location:  L superior/lateral/post shoulder, medial biceps and distal L UE  PRECAUTIONS: Other: CVA in 08/22   WEIGHT BEARING RESTRICTIONS: No  FALLS:  Has patient fallen in last 6 months? No  LIVING ENVIRONMENT: Lives with: lives with their spouse Lives in: 2 story home Stairs: yes  OCCUPATION: Pt not working.  Pt volunteers at church.    PLOF: Independent.  Pt was able to perform all of her ADLs/IADLs and reaching activities independently without difficulty.    PATIENT GOALS:improved mobility and strength.    OBJECTIVE:   DIAGNOSTIC FINDINGS:  Shoulder x rays on 6/20: FINDINGS: No evidence for an acute fracture. No findings to suggest shoulder separation or dislocation. Degenerative changes are noted at the acromioclavicular joint. Cortical irregularity at the rotator cuff insertion of the humeral head is compatible with degenerative change.   IMPRESSION: Degenerative changes without acute bony findings.  Elbow x rays on 6/20: FINDINGS: No evidence for an acute fracture. No subluxation or dislocation. Degenerative spurring is seen on the coronoid process of the ulna. No fat pad elevation to suggest joint effusion.   IMPRESSION: Degenerative changes without acute bony findings.   No joint effusion.    TODAY'S TREATMENT:  Manual Therapy:   GHJ mobilizations inf and posterior glides grade II PROM L shoulder in flex, scaption, ER, and IR   Therapeutic Exercise: Pulleys in flexion, scaption, and abduction  Supine wand flexion x15 Supine wand scaption x15 Active supine short arc flexion 1# 2x15 Seated ER at side with rag under arm Ball rolls 4way at wall x15ea (fatigues quickly) TB row/ext Blue TB 2x15ea Standing cane flexion to 90deg x10 Standing press out 2x10 L   PATIENT EDUCATION: Education details:  dx,  exercise form and rationale, HEP, POC, and relevant anatomy.  PT answered pt's questions.  Person educated: Patient Education method: Explanation, Demonstration, Tactile cues, Verbal cues Education comprehension: verbalized understanding, returned demonstration, verbal cues required, and tactile cues required  HOME EXERCISE PROGRAM: Access Code: [email or web address] URL: [email or web address] Date: 07/29/2022 Prepared by: Aaron Edelman  Exercises - Supine Shoulder Flexion AAROM   - 2 x daily - 7 x weekly - 1-2 sets - 10 reps - Seated Scapular Retraction  - 2 x daily - 7 x weekly - 2 sets - 10 reps - Seated Shoulder Flexion Towel Slide at Table Top  - 2 x daily - 7 x weekly - 1 sets - 5 reps  ASSESSMENT:  CLINICAL IMPRESSION: After missing PT appts and self reported non compliance with HEP, pt demonstrated increased stiffness in L shoulder today. She continues to guard against passive movement and responds better to [facility]. Fatigues very quickly with active movement again gravity. Difficulty with active ER from neutral, but able to complete this with partial range after cuing. Instructed pt how to replicate ball rolls at home using wash cloth at home to build endurance.   OBJECTIVE IMPAIRMENTS: decreased activity tolerance, decreased ROM, decreased strength, hypomobility, impaired flexibility, impaired UE functional use, and pain.   ACTIVITY LIMITATIONS: carrying, lifting, bed mobility, bathing, dressing, reach over head, and hygiene/grooming  PARTICIPATION LIMITATIONS: meal prep, cleaning, laundry, shopping, and community activity  PERSONAL FACTORS: 1 comorbidity: CVA in 2022  are also affecting patient's functional outcome.   REHAB POTENTIAL: Good  CLINICAL DECISION MAKING: Stable/uncomplicated  EVALUATION COMPLEXITY: Low   GOALS:  SHORT TERM GOALS: Target date:  08/19/2022  Pt will report at least a 25% improvement in functional usage of L UE with reduced  pain. Baseline: Goal status: MET 8/27  2.  Pt will demo at least a 20-25 deg improvement in L shoulder flexion and scaption AROM for improved UE elevation and reaching. Baseline:  Goal status: 50% MET  3.  Pt will be independent and compliant with HEP for improved pain, ROM, strength, and function.  Baseline:  Goal status: IN PROGRESS 8/27  4.  Pt will be able to use L UE with driving with increased ease. Baseline:  Goal status: MET 8/27 Target date:  08/26/2022    LONG TERM GOALS: Target date: 10/21/2022  Pt will demo improved flexion and scaption AROM to at least 120 deg with significantly improved compensatory shoulder hike for improved reaching and performance of overhead activities. Baseline:  Goal status: ONGOING  2.  Pt will be able to perform hair care without significant pain and difficulty.  Baseline:  Goal status: ONGOING  3.  Pt will be able to reach overhead into a cabinet. Baseline:  Goal status: ONGOING  4.  Pt will report she is able to perform bathing and dressing without significant difficulty.  Baseline:  Goal status: MET 8/27  5.  Pt will report improved usage of L UE with household chores with reduced  pain.  Baseline:  Goal status: MET 8/27   PLAN:  PT FREQUENCY: 2x/week  PT DURATION: 5-6 weeks  PLANNED INTERVENTIONS: Therapeutic exercises, Therapeutic activity, Neuromuscular re-education, Patient/Family education, Self Care, Joint mobilization, Aquatic Therapy, Dry Needling, Electrical stimulation, Spinal mobilization, Cryotherapy, Moist heat, Taping, Ultrasound, Manual therapy, and Re-evaluation  PLAN FOR NEXT SESSION: Cont with ROM, ther ex, jt mobs, and STW.       Riki Altes, PTA  10/06/22 2:23 PM

## 2022-10-07 LAB — HM DIABETES EYE EXAM

## 2022-10-10 ENCOUNTER — Ambulatory Visit (HOSPITAL_BASED_OUTPATIENT_CLINIC_OR_DEPARTMENT_OTHER): Payer: Managed Care, Other (non HMO) | Attending: Family Medicine

## 2022-10-10 ENCOUNTER — Encounter (HOSPITAL_BASED_OUTPATIENT_CLINIC_OR_DEPARTMENT_OTHER): Payer: Self-pay

## 2022-10-10 DIAGNOSIS — M25512 Pain in left shoulder: Secondary | ICD-10-CM | POA: Diagnosis present

## 2022-10-10 DIAGNOSIS — M25612 Stiffness of left shoulder, not elsewhere classified: Secondary | ICD-10-CM | POA: Diagnosis present

## 2022-10-10 DIAGNOSIS — M6281 Muscle weakness (generalized): Secondary | ICD-10-CM | POA: Insufficient documentation

## 2022-10-10 NOTE — Therapy (Signed)
OUTPATIENT PHYSICAL THERAPY TREATMENT   Patient Name: Vicki Gonzales MRN: [id number] DOB:08-01-1965, 57 y.o., female Today's Date: 10/10/2022  END OF SESSION:  PT End of Session - 10/10/22 1149     Visit Number 17    Number of Visits 22    Date for PT Re-Evaluation 10/21/22    Authorization Type [employer]    PT Start Time 1102    PT Stop Time 1145    PT Time Calculation (min) 43 min    Activity Tolerance Patient tolerated treatment well    Behavior During Therapy WFL for tasks assessed/performed                          Past Medical History:  Diagnosis Date   ADHD    Anxiety    Carpal tunnel syndrome on right    Depression    HTN (hypertension)    Hypothyroidism    Migraines    Spondylosis    Stroke [facility])    Past Surgical History:  Procedure Laterality Date   ANTERIOR CRUCIATE LIGAMENT REPAIR Left 2021   APPENDECTOMY     BUBBLE STUDY  08/17/2020   Procedure: BUBBLE STUDY;  Surgeon: Jodelle Red, MD;  Location: [facility] ENDOSCOPY;  Service: Cardiovascular;;   FALSE ANEURYSM REPAIR Right 08/15/2020   Procedure: REPAIR RIGHT FEMORAL PSEUDOANEURYSM;  Surgeon: Nada Libman, MD;  Location: [facility] OR;  Service: Vascular;  Laterality: Right;   IR ANGIO INTRA EXTRACRAN SEL COM CAROTID INNOMINATE UNI L MOD SED  08/14/2020   IR ANGIO INTRA EXTRACRAN SEL INTERNAL CAROTID UNI R MOD SED  08/14/2020   IR ANGIO VERTEBRAL SEL SUBCLAVIAN INNOMINATE BILAT MOD SED  08/14/2020   IR CT HEAD LTD  08/14/2020   IR CT HEAD LTD  08/14/2020   IR INTRA CRAN STENT  08/14/2020   IR INTRAVSC STENT CERV CAROTID W/O EMB-PROT MOD SED INC ANGIO  08/14/2020   IR RADIOLOGIST EVAL & MGMT  09/24/2020   PATENT FORAMEN OVALE(PFO) CLOSURE N/A 10/24/2020   Procedure: PATENT FORAMEN OVALE (PFO) CLOSURE;  Surgeon: Tonny Bollman, MD;  Location: [facility] INVASIVE CV LAB;  Service: Cardiovascular;  Laterality: N/A;   RADIOLOGY WITH ANESTHESIA N/A 08/14/2020   Procedure: IR WITH ANESTHESIA;  Surgeon:  Julieanne Cotton, MD;  Location: [facility] OR;  Service: Radiology;  Laterality: N/A;   TEE WITHOUT CARDIOVERSION N/A 08/17/2020   Procedure: TRANSESOPHAGEAL ECHOCARDIOGRAM (TEE);  Surgeon: Jodelle Red, MD;  Location: [facility] ENDOSCOPY;  Service: Cardiovascular;  Laterality: N/A;   TUBAL LIGATION     Patient Active Problem List   Diagnosis Date Noted   Shoulder injury 06/30/2022   Diabetes mellitus without complication (HCC) 02/09/2022   Obesity (BMI 30.0-34.9) 11/12/2021   Anxiety 04/10/2021   Decreased hearing of left ear 04/10/2021   Depression 01/02/2021   Essential hypertension 01/02/2021   Right middle cerebral artery stroke (HCC) 08/22/2020   Hypothyroidism    Hyperlipidemia    PFO (patent foramen ovale)    Internal carotid artery occlusion, right 08/14/2020   Acute right MCA stroke (HCC) 08/12/2020   Cerebrovascular accident (CVA) (HCC)     PCP: [name], [name], MD   REFERRING PROVIDER: [name], [name], MD   REFERRING DIAG: S49.92XA (ICD-10-CM) - Injury of left shoulder, initial encounter   THERAPY DIAG:  Stiffness of left shoulder, not elsewhere classified  Muscle weakness (generalized)  Left shoulder pain, unspecified chronicity  Rationale for Evaluation and Treatment: Rehabilitation  ONSET DATE:  06/25/2022  SUBJECTIVE:                                                                                                                                                                                      SUBJECTIVE STATEMENT:  Pt reports no new complaints in L shoulder at entry.    Hand dominance: Right  PERTINENT HISTORY: CVA in 08/22.  Pt had surgery including a stent-Pt still has residual numbness in L foot Patent foramen ovale closure 10/24/2020 Anxiety and depression HTN, DM, and CTS on R L ACL repair 2021   PAIN:  Are you having pain? No NPRS:  0/10 current, 4.5/10 worst, 0/10 best Location:  L superior/lateral/post shoulder, medial biceps and  distal L UE  PRECAUTIONS: Other: CVA in 08/22   WEIGHT BEARING RESTRICTIONS: No  FALLS:  Has patient fallen in last 6 months? No  LIVING ENVIRONMENT: Lives with: lives with their spouse Lives in: 2 story home Stairs: yes  OCCUPATION: Pt not working.  Pt volunteers at church.    PLOF: Independent.  Pt was able to perform all of her ADLs/IADLs and reaching activities independently without difficulty.    PATIENT GOALS:improved mobility and strength.    OBJECTIVE:   DIAGNOSTIC FINDINGS:  Shoulder x rays on 6/20: FINDINGS: No evidence for an acute fracture. No findings to suggest shoulder separation or dislocation. Degenerative changes are noted at the acromioclavicular joint. Cortical irregularity at the rotator cuff insertion of the humeral head is compatible with degenerative change.   IMPRESSION: Degenerative changes without acute bony findings.  Elbow x rays on 6/20: FINDINGS: No evidence for an acute fracture. No subluxation or dislocation. Degenerative spurring is seen on the coronoid process of the ulna. No fat pad elevation to suggest joint effusion.   IMPRESSION: Degenerative changes without acute bony findings.   No joint effusion.    TODAY'S TREATMENT:  Manual Therapy:   PROM L shoulder in flex, scaption, ER, and IR   Therapeutic Exercise: Pulleys in flexion abduction 2 minea Supine wand flexion x10 with 5 sec hold 2# bar Sidelying abduction to 90deg x10 Active supine short arc flexion 1# 2x15 Sidelying ER with towel x10 Ball rolls 4way at wall x15ea (fatigues quickly) TB row/ext Blue TB 2x15ea Standing flexion to 90 x10 Standing press out 2x10 L 1#   PATIENT EDUCATION: Education details:  dx, exercise form and rationale, HEP, POC, and relevant anatomy.  PT answered pt's questions.  Person educated:  Patient Education method: Explanation, Demonstration, Tactile cues, Verbal cues Education comprehension: verbalized understanding, returned demonstration, verbal cues required, and tactile cues required  HOME EXERCISE PROGRAM: Access Code: [email or web address] URL: [email or web address] Date: 07/29/2022 Prepared by: Aaron Edelman  Exercises - Supine Shoulder Flexion AAROM   - 2 x daily - 7 x weekly - 1-2 sets - 10 reps - Seated Scapular Retraction  - 2 x daily - 7 x weekly - 2 sets - 10 reps - Seated Shoulder Flexion Towel Slide at Table Top  - 2 x daily - 7 x weekly - 1 sets - 5 reps  ASSESSMENT:  CLINICAL IMPRESSION: Pt with improved stiffness on shoulder observed with AAROM tasks. Due to extensive guarding, pt did not benefit from PROM today. She does continue to have shoulder discomfort  with AROM that goes into lateral arm/elbow. Shoulder hiking remains present. Pt reported she was interested in seeing Dr. Steward Drone regarding her shoulder. Will discuss pt's progress level with evaluating PT.   OBJECTIVE IMPAIRMENTS: decreased activity tolerance, decreased ROM, decreased strength, hypomobility, impaired flexibility, impaired UE functional use, and pain.   ACTIVITY LIMITATIONS: carrying, lifting, bed mobility, bathing, dressing, reach over head, and hygiene/grooming  PARTICIPATION LIMITATIONS: meal prep, cleaning, laundry, shopping, and community activity  PERSONAL FACTORS: 1 comorbidity: CVA in 2022  are also affecting patient's functional outcome.   REHAB POTENTIAL: Good  CLINICAL DECISION MAKING: Stable/uncomplicated  EVALUATION COMPLEXITY: Low   GOALS:  SHORT TERM GOALS: Target date:  08/19/2022  Pt will report at least a 25% improvement in functional usage of L UE with reduced pain. Baseline: Goal status: MET 8/27  2.  Pt will demo at least a 20-25 deg improvement in L shoulder flexion and scaption AROM for improved UE elevation and reaching. Baseline:  Goal  status: 50% MET  3.  Pt will be independent and compliant with HEP for improved pain, ROM, strength, and function.  Baseline:  Goal status: IN PROGRESS 8/27  4.  Pt will be able to use L UE with driving with increased ease. Baseline:  Goal status: MET 8/27 Target date:  08/26/2022    LONG TERM GOALS: Target date: 10/21/2022  Pt will demo improved flexion and scaption AROM to at least 120 deg with significantly improved compensatory shoulder hike for improved reaching and performance of overhead activities. Baseline:  Goal status: ONGOING  2.  Pt will be able to perform hair care without significant pain and difficulty.  Baseline:  Goal status: ONGOING  3.  Pt will be able to reach overhead into a cabinet. Baseline:  Goal status: ONGOING  4.  Pt will report she is able to perform bathing and dressing without significant difficulty.  Baseline:  Goal status: MET 8/27  5.  Pt will report improved usage of L UE with household chores with reduced pain.  Baseline:  Goal status: MET 8/27   PLAN:  PT FREQUENCY: 2x/week  PT  DURATION: 5-6 weeks  PLANNED INTERVENTIONS: Therapeutic exercises, Therapeutic activity, Neuromuscular re-education, Patient/Family education, Self Care, Joint mobilization, Aquatic Therapy, Dry Needling, Electrical stimulation, Spinal mobilization, Cryotherapy, Moist heat, Taping, Ultrasound, Manual therapy, and Re-evaluation  PLAN FOR NEXT SESSION: Cont with ROM, ther ex, jt mobs, and STW.       Riki Altes, PTA  10/10/22 1:01 PM

## 2022-10-13 ENCOUNTER — Ambulatory Visit (HOSPITAL_BASED_OUTPATIENT_CLINIC_OR_DEPARTMENT_OTHER): Payer: Managed Care, Other (non HMO) | Admitting: Physical Therapy

## 2022-10-13 ENCOUNTER — Encounter (HOSPITAL_BASED_OUTPATIENT_CLINIC_OR_DEPARTMENT_OTHER): Payer: Self-pay | Admitting: *Deleted

## 2022-10-13 DIAGNOSIS — M25512 Pain in left shoulder: Secondary | ICD-10-CM

## 2022-10-13 DIAGNOSIS — M25612 Stiffness of left shoulder, not elsewhere classified: Secondary | ICD-10-CM | POA: Diagnosis not present

## 2022-10-13 DIAGNOSIS — M6281 Muscle weakness (generalized): Secondary | ICD-10-CM

## 2022-10-13 NOTE — Therapy (Signed)
OUTPATIENT PHYSICAL THERAPY TREATMENT   Patient Name: Vicki Gonzales MRN: [id number] DOB:17-Mar-1965, 57 y.o., female Today's Date: 10/14/2022  END OF SESSION:  PT End of Session - 10/13/22 1409     Visit Number 18    Number of Visits 22    Date for PT Re-Evaluation 10/21/22    Authorization Type [employer]    PT Start Time 1319    PT Stop Time 1404    PT Time Calculation (min) 45 min    Activity Tolerance Patient tolerated treatment well    Behavior During Therapy WFL for tasks assessed/performed                           Past Medical History:  Diagnosis Date   ADHD    Anxiety    Carpal tunnel syndrome on right    Depression    HTN (hypertension)    Hypothyroidism    Migraines    Spondylosis    Stroke [facility])    Past Surgical History:  Procedure Laterality Date   ANTERIOR CRUCIATE LIGAMENT REPAIR Left 2021   APPENDECTOMY     BUBBLE STUDY  08/17/2020   Procedure: BUBBLE STUDY;  Surgeon: Jodelle Red, MD;  Location: [facility] ENDOSCOPY;  Service: Cardiovascular;;   FALSE ANEURYSM REPAIR Right 08/15/2020   Procedure: REPAIR RIGHT FEMORAL PSEUDOANEURYSM;  Surgeon: Nada Libman, MD;  Location: [facility] OR;  Service: Vascular;  Laterality: Right;   IR ANGIO INTRA EXTRACRAN SEL COM CAROTID INNOMINATE UNI L MOD SED  08/14/2020   IR ANGIO INTRA EXTRACRAN SEL INTERNAL CAROTID UNI R MOD SED  08/14/2020   IR ANGIO VERTEBRAL SEL SUBCLAVIAN INNOMINATE BILAT MOD SED  08/14/2020   IR CT HEAD LTD  08/14/2020   IR CT HEAD LTD  08/14/2020   IR INTRA CRAN STENT  08/14/2020   IR INTRAVSC STENT CERV CAROTID W/O EMB-PROT MOD SED INC ANGIO  08/14/2020   IR RADIOLOGIST EVAL & MGMT  09/24/2020   PATENT FORAMEN OVALE(PFO) CLOSURE N/A 10/24/2020   Procedure: PATENT FORAMEN OVALE (PFO) CLOSURE;  Surgeon: Tonny Bollman, MD;  Location: [facility] INVASIVE CV LAB;  Service: Cardiovascular;  Laterality: N/A;   RADIOLOGY WITH ANESTHESIA N/A 08/14/2020   Procedure: IR WITH ANESTHESIA;  Surgeon:  Julieanne Cotton, MD;  Location: [facility] OR;  Service: Radiology;  Laterality: N/A;   TEE WITHOUT CARDIOVERSION N/A 08/17/2020   Procedure: TRANSESOPHAGEAL ECHOCARDIOGRAM (TEE);  Surgeon: Jodelle Red, MD;  Location: [facility] ENDOSCOPY;  Service: Cardiovascular;  Laterality: N/A;   TUBAL LIGATION     Patient Active Problem List   Diagnosis Date Noted   Shoulder injury 06/30/2022   Diabetes mellitus without complication (HCC) 02/09/2022   Obesity (BMI 30.0-34.9) 11/12/2021   Anxiety 04/10/2021   Decreased hearing of left ear 04/10/2021   Depression 01/02/2021   Essential hypertension 01/02/2021   Right middle cerebral artery stroke (HCC) 08/22/2020   Hypothyroidism    Hyperlipidemia    PFO (patent foramen ovale)    Internal carotid artery occlusion, right 08/14/2020   Acute right MCA stroke (HCC) 08/12/2020   Cerebrovascular accident (CVA) (HCC)     PCP: [name], [name], MD   REFERRING PROVIDER: [name], [name], MD   REFERRING DIAG: S49.92XA (ICD-10-CM) - Injury of left shoulder, initial encounter   THERAPY DIAG:  Stiffness of left shoulder, not elsewhere classified  Muscle weakness (generalized)  Left shoulder pain, unspecified chronicity  Rationale for Evaluation and Treatment: Rehabilitation  ONSET  DATE: 06/25/2022  SUBJECTIVE:                                                                                                                                                                                      SUBJECTIVE STATEMENT:  Pt denies any adverse effects after prior Rx.  Pt denies pain currently.  Pt states she is not improving with UE elevation.  Pt states she is going to going to make an appt with ortho MD.    Hand dominance: Right  PERTINENT HISTORY: CVA in 08/22.  Pt had surgery including a stent-Pt still has residual numbness in L foot Patent foramen ovale closure 10/24/2020 Anxiety and depression HTN, DM, and CTS on R L ACL repair 2021   PAIN:   Are you having pain? No NPRS:  0/10 current, 4.5/10 worst, 0/10 best Location:  L superior/lateral/post shoulder, medial biceps and distal L UE  PRECAUTIONS: Other: CVA in 08/22   WEIGHT BEARING RESTRICTIONS: No  FALLS:  Has patient fallen in last 6 months? No  LIVING ENVIRONMENT: Lives with: lives with their spouse Lives in: 2 story home Stairs: yes  OCCUPATION: Pt not working.  Pt volunteers at church.    PLOF: Independent.  Pt was able to perform all of her ADLs/IADLs and reaching activities independently without difficulty.    PATIENT GOALS:improved mobility and strength.    OBJECTIVE:   DIAGNOSTIC FINDINGS:  Shoulder x rays on 6/20: FINDINGS: No evidence for an acute fracture. No findings to suggest shoulder separation or dislocation. Degenerative changes are noted at the acromioclavicular joint. Cortical irregularity at the rotator cuff insertion of the humeral head is compatible with degenerative change.   IMPRESSION: Degenerative changes without acute bony findings.  Elbow x rays on 6/20: FINDINGS: No evidence for an acute fracture. No subluxation or dislocation. Degenerative spurring is seen on the coronoid process of the ulna. No fat pad elevation to suggest joint effusion.   IMPRESSION: Degenerative changes without acute bony findings.   No joint effusion.    TODAY'S TREATMENT:  L shoulder AROM: Pt has a compensatory shoulder hike with UE elevation.  Flexion in standing/supine:  82/130 deg Scaption: 88 deg Abd: 74 deg  ER lag test:  Pt does have slight movement though has much improved control.      Pt received PROM L shoulder in flex, scaption, ER, and IR   Therapeutic Exercise: Pulleys in flexion abduction 2 minea Supine wand flexion x10 with 5 sec hold 2# bar Sidelying abduction to 90deg  x10 Active supine short arc flexion 1# 2x10  Neuro Re-ed Activities: Supine shoulder ABC x 1 reps with 1# Supine rhythmic stabs 3x30 sec 4D with washcloth at wall cw, ccw, up/down, and s/s approx 10-12 with 1 rest break    PATIENT EDUCATION: Education details:  objective findings, dx, exercise form and rationale, HEP, POC, and relevant anatomy.  PT answered pt's questions.  Person educated: Patient Education method: Explanation, Demonstration, Tactile cues, Verbal cues Education comprehension: verbalized understanding, returned demonstration, verbal cues required, and tactile cues required  HOME EXERCISE PROGRAM: Access Code: [email or web address] URL: [email or web address] Date: 07/29/2022 Prepared by: Aaron Edelman  Exercises - Supine Shoulder Flexion AAROM   - 2 x daily - 7 x weekly - 1-2 sets - 10 reps - Seated Scapular Retraction  - 2 x daily - 7 x weekly - 2 sets - 10 reps - Seated Shoulder Flexion Towel Slide at Table Top  - 2 x daily - 7 x weekly - 1 sets - 5 reps  ASSESSMENT:  CLINICAL IMPRESSION: Pt continues to have significant limitations with UE elevation against gravity.  Pt is not making significant progress with standing UE elevation and actually had worse flexion AROM today.  She also demonstrates a compensatory shoulder hike with UE elevation.  Pt able to perform exercises with greater ease and ROM in gravity minimized position.  Pt was fatigued with washcloth on wall.  Pt asked about seeing ortho MD and PT agreed it would be good to see ortho MD due to continued limitations and deficits.  She responded well to Rx having no c/o's after Rx.   OBJECTIVE IMPAIRMENTS: decreased activity tolerance, decreased ROM, decreased strength, hypomobility, impaired flexibility, impaired UE functional use, and pain.   ACTIVITY LIMITATIONS: carrying, lifting, bed mobility, bathing, dressing, reach over head, and hygiene/grooming  PARTICIPATION LIMITATIONS: meal prep, cleaning,  laundry, shopping, and community activity  PERSONAL FACTORS: 1 comorbidity: CVA in 2022  are also affecting patient's functional outcome.   REHAB POTENTIAL: Good  CLINICAL DECISION MAKING: Stable/uncomplicated  EVALUATION COMPLEXITY: Low   GOALS:  SHORT TERM GOALS: Target date:  08/19/2022  Pt will report at least a 25% improvement in functional usage of L UE with reduced pain. Baseline: Goal status: MET 8/27  2.  Pt will demo at least a 20-25 deg improvement in L shoulder flexion and scaption AROM for improved UE elevation and reaching. Baseline:  Goal status: 50% MET  3.  Pt will be independent and compliant with HEP for improved pain, ROM, strength, and function.  Baseline:  Goal status: IN PROGRESS 8/27  4.  Pt will be able to use L UE with driving with increased ease. Baseline:  Goal status: MET 8/27 Target date:  08/26/2022    LONG TERM GOALS: Target date: 10/21/2022  Pt will demo improved flexion and scaption AROM to at least 120 deg with significantly improved compensatory shoulder hike for improved reaching and performance of overhead activities. Baseline:  Goal status: ONGOING  2.  Pt will be able to perform  hair care without significant pain and difficulty.  Baseline:  Goal status: ONGOING  3.  Pt will be able to reach overhead into a cabinet. Baseline:  Goal status: ONGOING  4.  Pt will report she is able to perform bathing and dressing without significant difficulty.  Baseline:  Goal status: MET 8/27  5.  Pt will report improved usage of L UE with household chores with reduced pain.  Baseline:  Goal status: MET 8/27   PLAN:  PT FREQUENCY: 2x/week  PT DURATION: 5-6 weeks  PLANNED INTERVENTIONS: Therapeutic exercises, Therapeutic activity, Neuromuscular re-education, Patient/Family education, Self Care, Joint mobilization, Aquatic Therapy, Dry Needling, Electrical stimulation, Spinal mobilization, Cryotherapy, Moist heat, Taping, Ultrasound,  Manual therapy, and Re-evaluation  PLAN FOR NEXT SESSION: Cont with ROM, ther ex, proprio, jt mobs, and STW.  Pt is planning to see Dr. Steward Drone to evaluate shoulder.    Audie Clear III PT, DPT 10/14/22 9:35 PM

## 2022-10-14 ENCOUNTER — Encounter (HOSPITAL_BASED_OUTPATIENT_CLINIC_OR_DEPARTMENT_OTHER): Payer: Self-pay | Admitting: Physical Therapy

## 2022-10-15 ENCOUNTER — Ambulatory Visit (HOSPITAL_BASED_OUTPATIENT_CLINIC_OR_DEPARTMENT_OTHER): Payer: Managed Care, Other (non HMO) | Admitting: Student

## 2022-10-15 ENCOUNTER — Encounter (HOSPITAL_BASED_OUTPATIENT_CLINIC_OR_DEPARTMENT_OTHER): Payer: Self-pay | Admitting: Student

## 2022-10-15 DIAGNOSIS — G8929 Other chronic pain: Secondary | ICD-10-CM | POA: Diagnosis not present

## 2022-10-15 DIAGNOSIS — M25512 Pain in left shoulder: Secondary | ICD-10-CM | POA: Diagnosis not present

## 2022-10-15 MED ORDER — LIDOCAINE HCL 1 % IJ SOLN
4.0000 mL | INTRAMUSCULAR | Status: AC | PRN
Start: 2022-10-15 — End: 2022-10-15
  Administered 2022-10-15: 4 mL

## 2022-10-15 MED ORDER — TRIAMCINOLONE ACETONIDE 40 MG/ML IJ SUSP
2.0000 mL | INTRAMUSCULAR | Status: AC | PRN
Start: 2022-10-15 — End: 2022-10-15
  Administered 2022-10-15: 2 mL via INTRA_ARTICULAR

## 2022-10-15 NOTE — Progress Notes (Signed)
Chief Complaint: Left shoulder pain     History of Present Illness:    Vicki Gonzales is a 57 y.o. female presenting today for evaluation of left shoulder pain.  This has been going on since June of this year, after she injured it swinging a bag of trash into the trash can.  She was seen afterward in the emergency department and has been following on this with Dr. [name].  She has completed 18 visits of physical therapy downstairs but feels like she is not progressing.  She reports continued pain with limited range of motion particularly overhead.  This does bother her more at night.  Does have a history of a prior stroke 2 years ago which affected her left side, but she mostly has residual numbness and tingling.  She has been taking Meloxicam and Tylenol for pain.   Surgical History:   None  PMH/PSH/Family History/Social History/Meds/Allergies:    Past Medical History:  Diagnosis Date   ADHD    Anxiety    Carpal tunnel syndrome on right    Depression    HTN (hypertension)    Hypothyroidism    Migraines    Spondylosis    Stroke [facility])    Past Surgical History:  Procedure Laterality Date   ANTERIOR CRUCIATE LIGAMENT REPAIR Left 2021   APPENDECTOMY     BUBBLE STUDY  08/17/2020   Procedure: BUBBLE STUDY;  Surgeon: Jodelle Red, MD;  Location: [facility] ENDOSCOPY;  Service: Cardiovascular;;   FALSE ANEURYSM REPAIR Right 08/15/2020   Procedure: REPAIR RIGHT FEMORAL PSEUDOANEURYSM;  Surgeon: Nada Libman, MD;  Location: [facility] OR;  Service: Vascular;  Laterality: Right;   IR ANGIO INTRA EXTRACRAN SEL COM CAROTID INNOMINATE UNI L MOD SED  08/14/2020   IR ANGIO INTRA EXTRACRAN SEL INTERNAL CAROTID UNI R MOD SED  08/14/2020   IR ANGIO VERTEBRAL SEL SUBCLAVIAN INNOMINATE BILAT MOD SED  08/14/2020   IR CT HEAD LTD  08/14/2020   IR CT HEAD LTD  08/14/2020   IR INTRA CRAN STENT  08/14/2020   IR INTRAVSC STENT CERV CAROTID W/O EMB-PROT MOD SED INC ANGIO   08/14/2020   IR RADIOLOGIST EVAL & MGMT  09/24/2020   PATENT FORAMEN OVALE(PFO) CLOSURE N/A 10/24/2020   Procedure: PATENT FORAMEN OVALE (PFO) CLOSURE;  Surgeon: Tonny Bollman, MD;  Location: [facility] INVASIVE CV LAB;  Service: Cardiovascular;  Laterality: N/A;   RADIOLOGY WITH ANESTHESIA N/A 08/14/2020   Procedure: IR WITH ANESTHESIA;  Surgeon: Julieanne Cotton, MD;  Location: [facility] OR;  Service: Radiology;  Laterality: N/A;   TEE WITHOUT CARDIOVERSION N/A 08/17/2020   Procedure: TRANSESOPHAGEAL ECHOCARDIOGRAM (TEE);  Surgeon: Jodelle Red, MD;  Location: [facility] ENDOSCOPY;  Service: Cardiovascular;  Laterality: N/A;   TUBAL LIGATION     Social History   Socioeconomic History   Marital status: Married    Spouse name: Not on file   Number of children: 5   Years of education: 12   Highest education level: 12th grade  Occupational History   Occupation: Retired  Tobacco Use   Smoking status: Former   Smokeless tobacco: Never   Tobacco comments:    Quit smoking in her 20's.  Substance and Sexual Activity   Alcohol use: Not Currently   Drug use: Never   Sexual activity: Not on file  Other Topics Concern   Not on file  Social History Narrative   Lives with family.   Right-handed.   Caffeine use: one cup coffee each morning.   Social Determinants of Health   Financial Resource Strain: Low Risk  (09/16/2022)   Overall Financial Resource Strain (CARDIA)    Difficulty of Paying Living Expenses: Not hard at all  Food Insecurity: No Food Insecurity (09/16/2022)   Hunger Vital Sign    Worried About Running Out of Food in the Last Year: Never true    Ran Out of Food in the Last Year: Never true  Transportation Needs: No Transportation Needs (09/16/2022)   PRAPARE - Administrator, Civil Service (Medical): No    Lack of Transportation (Non-Medical): No  Physical Activity: Insufficiently Active (04/30/2022)   Exercise Vital Sign    Days of Exercise per Week: 2 days    Minutes of  Exercise per Session: 20 min  Stress: Stress Concern Present (09/16/2022)   [employer] of Occupational Health - Occupational Stress Questionnaire    Feeling of Stress : To some extent  Social Connections: Moderately Integrated (09/16/2022)   Social Connection and Isolation Panel [NHANES]    Frequency of Communication with Friends and Family: More than three times a week    Frequency of Social Gatherings with Friends and Family: More than three times a week    Attends Religious Services: More than 4 times per year    Active Member of [employer] or Organizations: No    Attends Engineer, structural: Never    Marital Status: Married   Family History  Problem Relation Age of Onset   Dementia Mother    Congestive Heart Failure Father    Breast cancer Paternal Aunt    Allergies  Allergen Reactions   Keflex [Cephalexin] Itching   Current Outpatient Medications  Medication Sig Dispense Refill   acetaminophen (TYLENOL) 325 MG tablet Take 325 mg by mouth every 6 (six) hours as needed for moderate pain.     aspirin 81 MG chewable tablet Chew 1 tablet (81 mg total) by mouth daily.     escitalopram (LEXAPRO) 10 MG tablet Take 1 tablet (10 mg total) by mouth daily. 90 tablet 0   levothyroxine (SYNTHROID) 100 MCG tablet TAKE 1 TABLET BY MOUTH EVERY DAY 90 tablet 0   lisinopril (ZESTRIL) 5 MG tablet TAKE 1 TABLET (5 MG TOTAL) BY MOUTH DAILY. 90 tablet 1   meloxicam (MOBIC) 7.5 MG tablet TAKE 1 TABLET BY MOUTH EVERY DAY 30 tablet 0   metFORMIN (GLUCOPHAGE) 500 MG tablet Take 1 tablet (500 mg total) by mouth 2 (two) times daily with a meal. 180 tablet 1   oxyCODONE-acetaminophen (PERCOCET/ROXICET) 5-325 MG tablet Take 1 tablet by mouth every 6 (six) hours as needed for severe pain. 10 tablet 0   rosuvastatin (CRESTOR) 20 MG tablet Take 1 tablet (20 mg total) by mouth daily. 90 tablet 0   No current facility-administered medications for this visit.   No results found.  Review of Systems:    A ROS was performed including pertinent positives and negatives as documented in the HPI.  Physical Exam :   Constitutional: NAD and appears stated age Neurological: Alert and oriented Psych: Appropriate affect and cooperative Last menstrual period 04/07/2019.   Comprehensive Musculoskeletal Exam:    Musculoskeletal Exam    Inspection Right Left  Skin No atrophy or winging No atrophy or winging  Palpation    Tenderness None Anterior glenohumeral  joint and lateral deltoid  Range of Motion    Flexion (passive) 170 120  Flexion (active) 170 90  Abduction 170 90  ER at the side 30 10  Can reach behind back to L4 L4  Strength     5/5 Decreased  Special Tests    Pseudoparalytic No No  Neurologic    Fires PIN, radial, median, ulnar, musculocutaneous, axillary, suprascapular, long thoracic, and spinal accessory innervated muscles. No abnormal sensibility  Vascular/Lymphatic    Radial Pulse 2+ 2+  Cervical Exam    Patient has symmetric cervical range of motion with negative Spurling's test.  Special Test: Positive empty can test     Imaging:   Xray review from 06/26/2022 (left shoulder 3 views): Mild glenohumeral and acromioclavicular degenerative changes.  Small osteophyte and irregularities over the greater tuberosity.     I personally reviewed and interpreted the radiographs.   Assessment:   57 y.o. female with chronic left shoulder pain.  She has not gotten significant improvements with physical therapy so I believe at this time is indicated to obtain an MRI for further evaluation.  Given her difficulty with overhead activities and night pain I am suspicious for a rotator cuff injury.  Will still consider adhesive capsulitis was differential given her slightly limited passive range of motion.  I have offered a subacromial cortisone injection today for symptom relief which patient is agreeable to.  This was performed in clinic and patient tolerated procedure well.  Will have  her return to clinic after MRI for review and further discussion.  Plan :    -Left subacromial cortisone injection performed today after patient consent -Obtain MRI of the left shoulder and return to clinic for review     Procedure Note  Patient: Vicki Gonzales             Date of Birth: [birth year]           MRN: [id number]             Visit Date: 10/15/2022  Procedures: Visit Diagnoses:  1. Chronic left shoulder pain     Large Joint Inj: L subacromial bursa on 10/15/2022 1:06 PM Indications: pain Details: 22 G 1.5 in needle, posterior approach Medications: 4 mL lidocaine 1 %; 2 mL triamcinolone acetonide 40 MG/ML Outcome: tolerated well, no immediate complications Procedure, treatment alternatives, risks and benefits explained, specific risks discussed. Consent was given by the patient. Immediately prior to procedure a time out was called to verify the correct patient, procedure, equipment, support staff and site/side marked as required. Patient was prepped and draped in the usual sterile fashion.      I personally saw and evaluated the patient, and participated in the management and treatment plan.  Hazle Nordmann, PA-C Orthopedics

## 2022-10-16 ENCOUNTER — Other Ambulatory Visit (HOSPITAL_BASED_OUTPATIENT_CLINIC_OR_DEPARTMENT_OTHER): Payer: Self-pay | Admitting: Nurse Practitioner

## 2022-10-16 DIAGNOSIS — F419 Anxiety disorder, unspecified: Secondary | ICD-10-CM

## 2022-10-17 ENCOUNTER — Ambulatory Visit (HOSPITAL_BASED_OUTPATIENT_CLINIC_OR_DEPARTMENT_OTHER): Payer: Managed Care, Other (non HMO) | Admitting: Physical Therapy

## 2022-10-17 DIAGNOSIS — M25512 Pain in left shoulder: Secondary | ICD-10-CM

## 2022-10-17 DIAGNOSIS — M6281 Muscle weakness (generalized): Secondary | ICD-10-CM

## 2022-10-17 DIAGNOSIS — M25612 Stiffness of left shoulder, not elsewhere classified: Secondary | ICD-10-CM

## 2022-10-17 NOTE — Therapy (Signed)
OUTPATIENT PHYSICAL THERAPY TREATMENT   Patient Name: Vicki Gonzales MRN: [id number] DOB:Jun 10, 1965, 57 y.o., female Today's Date: 10/18/2022  END OF SESSION:  PT End of Session - 10/17/22 1106     Visit Number 19    Number of Visits 22    Date for PT Re-Evaluation 10/21/22    Authorization Type [employer]    PT Start Time 1103    PT Stop Time 1142    PT Time Calculation (min) 39 min    Activity Tolerance Patient tolerated treatment well    Behavior During Therapy WFL for tasks assessed/performed                           Past Medical History:  Diagnosis Date   ADHD    Anxiety    Carpal tunnel syndrome on right    Depression    HTN (hypertension)    Hypothyroidism    Migraines    Spondylosis    Stroke [facility])    Past Surgical History:  Procedure Laterality Date   ANTERIOR CRUCIATE LIGAMENT REPAIR Left 2021   APPENDECTOMY     BUBBLE STUDY  08/17/2020   Procedure: BUBBLE STUDY;  Surgeon: Jodelle Red, MD;  Location: [facility] ENDOSCOPY;  Service: Cardiovascular;;   FALSE ANEURYSM REPAIR Right 08/15/2020   Procedure: REPAIR RIGHT FEMORAL PSEUDOANEURYSM;  Surgeon: Nada Libman, MD;  Location: [facility] OR;  Service: Vascular;  Laterality: Right;   IR ANGIO INTRA EXTRACRAN SEL COM CAROTID INNOMINATE UNI L MOD SED  08/14/2020   IR ANGIO INTRA EXTRACRAN SEL INTERNAL CAROTID UNI R MOD SED  08/14/2020   IR ANGIO VERTEBRAL SEL SUBCLAVIAN INNOMINATE BILAT MOD SED  08/14/2020   IR CT HEAD LTD  08/14/2020   IR CT HEAD LTD  08/14/2020   IR INTRA CRAN STENT  08/14/2020   IR INTRAVSC STENT CERV CAROTID W/O EMB-PROT MOD SED INC ANGIO  08/14/2020   IR RADIOLOGIST EVAL & MGMT  09/24/2020   PATENT FORAMEN OVALE(PFO) CLOSURE N/A 10/24/2020   Procedure: PATENT FORAMEN OVALE (PFO) CLOSURE;  Surgeon: Tonny Bollman, MD;  Location: [facility] INVASIVE CV LAB;  Service: Cardiovascular;  Laterality: N/A;   RADIOLOGY WITH ANESTHESIA N/A 08/14/2020   Procedure: IR WITH ANESTHESIA;  Surgeon:  Julieanne Cotton, MD;  Location: [facility] OR;  Service: Radiology;  Laterality: N/A;   TEE WITHOUT CARDIOVERSION N/A 08/17/2020   Procedure: TRANSESOPHAGEAL ECHOCARDIOGRAM (TEE);  Surgeon: Jodelle Red, MD;  Location: [facility] ENDOSCOPY;  Service: Cardiovascular;  Laterality: N/A;   TUBAL LIGATION     Patient Active Problem List   Diagnosis Date Noted   Shoulder injury 06/30/2022   Diabetes mellitus without complication (HCC) 02/09/2022   Obesity (BMI 30.0-34.9) 11/12/2021   Anxiety 04/10/2021   Decreased hearing of left ear 04/10/2021   Depression 01/02/2021   Essential hypertension 01/02/2021   Right middle cerebral artery stroke (HCC) 08/22/2020   Hypothyroidism    Hyperlipidemia    PFO (patent foramen ovale)    Internal carotid artery occlusion, right 08/14/2020   Acute right MCA stroke (HCC) 08/12/2020   Cerebrovascular accident (CVA) (HCC)     PCP: [name], [name], MD   REFERRING PROVIDER: [name], [name], MD   REFERRING DIAG: S49.92XA (ICD-10-CM) - Injury of left shoulder, initial encounter   THERAPY DIAG:  Stiffness of left shoulder, not elsewhere classified  Muscle weakness (generalized)  Left shoulder pain, unspecified chronicity  Rationale for Evaluation and Treatment: Rehabilitation  ONSET  DATE: 06/25/2022  SUBJECTIVE:                                                                                                                                                                                      SUBJECTIVE STATEMENT:  Pt saw Hiram Comber, PA on Wednesday and was very pleased with her care.  Pt received a subacromial cortisone injection and reports improved sx's.  Her arm doesn't feel "as twisted".  PA ordered a MRI and MRI was scheduled for 10/30/2022.  Pt had  no adverse effects after prior Rx, just soreness.  Pt denies pain currently.    Hand dominance: Right  PERTINENT HISTORY: CVA in 08/22.  Pt had surgery including a stent-Pt still has  residual numbness in L foot Patent foramen ovale closure 10/24/2020 Anxiety and depression HTN, DM, and CTS on R L ACL repair 2021   PAIN:  Are you having pain? No NPRS:  0/10 current, 4.5/10 worst, 0/10 best Location:  L superior/lateral/post shoulder, medial biceps and distal L UE  PRECAUTIONS: Other: CVA in 08/22   WEIGHT BEARING RESTRICTIONS: No  FALLS:  Has patient fallen in last 6 months? No  LIVING ENVIRONMENT: Lives with: lives with their spouse Lives in: 2 story home Stairs: yes  OCCUPATION: Pt not working.  Pt volunteers at church.    PLOF: Independent.  Pt was able to perform all of her ADLs/IADLs and reaching activities independently without difficulty.    PATIENT GOALS:improved mobility and strength.    OBJECTIVE:   DIAGNOSTIC FINDINGS:  Shoulder x rays on 6/20: FINDINGS: No evidence for an acute fracture. No findings to suggest shoulder separation or dislocation. Degenerative changes are noted at the acromioclavicular joint. Cortical irregularity at the rotator cuff insertion of the humeral head is compatible with degenerative change.   IMPRESSION: Degenerative changes without acute bony findings.  Elbow x rays on 6/20: FINDINGS: No evidence for an acute fracture. No subluxation or dislocation. Degenerative spurring is seen on the coronoid process of the ulna. No fat pad elevation to suggest joint effusion.   IMPRESSION: Degenerative changes without acute bony findings.   No joint effusion.    TODAY'S TREATMENT:  Reviewed pain levels, response to prior Rx, and pt presentation.    Pt received PROM L shoulder in flex, scaption, ER, and IR   Therapeutic Exercise: Pulleys in flexion, scaption, and abduction 2 min ea Supine wand flexion approx 12-15  with cane Supine shoulder ABC x 1 reps Supine  shoulder flexion AROM x20 reps Supine rhythmic stabs 3x30 sec  See below for pt education    PATIENT EDUCATION: Education details:  dx, exercise form and rationale, HEP, POC, and relevant anatomy.  PT answered pt's questions.  Person educated: Patient Education method: Explanation, Demonstration, Tactile cues, Verbal cues Education comprehension: verbalized understanding, returned demonstration, verbal cues required, and tactile cues required  HOME EXERCISE PROGRAM: Access Code: [email or web address] URL: [email or web address] Date: 07/29/2022 Prepared by: Aaron Edelman  Exercises - Supine Shoulder Flexion AAROM   - 2 x daily - 7 x weekly - 1-2 sets - 10 reps - Seated Scapular Retraction  - 2 x daily - 7 x weekly - 2 sets - 10 reps - Seated Shoulder Flexion Towel Slide at Table Top  - 2 x daily - 7 x weekly - 1 sets - 5 reps  ASSESSMENT:  CLINICAL IMPRESSION:  Pt received a cortisone injection 2 days ago and reports improved sx's.  Pt is getting a MRI on 10/24.  PT decreased intensity and volume of exercises today due to recent cortisone injection.  Pt doesn't feel as stiff with pulleys today.  Pt continues to be very limited with UE elevation and has a compensatory shoulder hike.  Pt tolerated exercises well.  She responded well to Rx having no c/o's after Rx.  PT will perform a PN next visit.    OBJECTIVE IMPAIRMENTS: decreased activity tolerance, decreased ROM, decreased strength, hypomobility, impaired flexibility, impaired UE functional use, and pain.   ACTIVITY LIMITATIONS: carrying, lifting, bed mobility, bathing, dressing, reach over head, and hygiene/grooming  PARTICIPATION LIMITATIONS: meal prep, cleaning, laundry, shopping, and community activity  PERSONAL FACTORS: 1 comorbidity: CVA in 2022  are also affecting patient's functional outcome.   REHAB POTENTIAL: Good  CLINICAL DECISION MAKING: Stable/uncomplicated  EVALUATION COMPLEXITY: Low   GOALS:  SHORT  TERM GOALS: Target date:  08/19/2022  Pt will report at least a 25% improvement in functional usage of L UE with reduced pain. Baseline: Goal status: MET 8/27  2.  Pt will demo at least a 20-25 deg improvement in L shoulder flexion and scaption AROM for improved UE elevation and reaching. Baseline:  Goal status: 50% MET  3.  Pt will be independent and compliant with HEP for improved pain, ROM, strength, and function.  Baseline:  Goal status: IN PROGRESS 8/27  4.  Pt will be able to use L UE with driving with increased ease. Baseline:  Goal status: MET 8/27 Target date:  08/26/2022    LONG TERM GOALS: Target date: 10/21/2022  Pt will demo improved flexion and scaption AROM to at least 120 deg with significantly improved compensatory shoulder hike for improved reaching and performance of overhead activities. Baseline:  Goal status: ONGOING  2.  Pt will be able to perform hair care without significant pain and difficulty.  Baseline:  Goal status: ONGOING  3.  Pt will be able to reach overhead into a cabinet. Baseline:  Goal status: ONGOING  4.  Pt will report she is able to perform bathing and dressing without significant difficulty.  Baseline:  Goal status: MET 8/27  5.  Pt will report improved usage of L UE with household  chores with reduced pain.  Baseline:  Goal status: MET 8/27   PLAN:  PT FREQUENCY: 2x/week  PT DURATION: 5-6 weeks  PLANNED INTERVENTIONS: Therapeutic exercises, Therapeutic activity, Neuromuscular re-education, Patient/Family education, Self Care, Joint mobilization, Aquatic Therapy, Dry Needling, Electrical stimulation, Spinal mobilization, Cryotherapy, Moist heat, Taping, Ultrasound, Manual therapy, and Re-evaluation  PLAN FOR NEXT SESSION: Cont with ROM, ther ex, proprio, jt mobs, and STW.  Pt is getting a MRI on 10/30/22. Assess shoulder ROM and PN next visit.  PT spoke to pt about decreasing frequency of PT until she gets her MRI  results.   Audie Clear III PT, DPT 10/18/22 7:45 AM

## 2022-10-18 ENCOUNTER — Encounter (HOSPITAL_BASED_OUTPATIENT_CLINIC_OR_DEPARTMENT_OTHER): Payer: Self-pay | Admitting: Physical Therapy

## 2022-10-20 ENCOUNTER — Ambulatory Visit (HOSPITAL_BASED_OUTPATIENT_CLINIC_OR_DEPARTMENT_OTHER): Payer: Managed Care, Other (non HMO) | Admitting: Physical Therapy

## 2022-10-20 DIAGNOSIS — M25612 Stiffness of left shoulder, not elsewhere classified: Secondary | ICD-10-CM | POA: Diagnosis not present

## 2022-10-20 DIAGNOSIS — M25512 Pain in left shoulder: Secondary | ICD-10-CM

## 2022-10-20 DIAGNOSIS — M6281 Muscle weakness (generalized): Secondary | ICD-10-CM

## 2022-10-20 NOTE — Therapy (Signed)
OUTPATIENT PHYSICAL THERAPY TREATMENT   Patient Name: Vicki Gonzales MRN: [id number] DOB:1965-12-05, 57 y.o., female Today's Date: 10/21/2022  END OF SESSION:  PT End of Session - 10/20/22 1202     Visit Number 20    Number of Visits 25    Date for PT Re-Evaluation 11/24/22    Authorization Type [employer]    PT Start Time 1115    PT Stop Time 1155    PT Time Calculation (min) 40 min    Activity Tolerance Patient tolerated treatment well    Behavior During Therapy WFL for tasks assessed/performed                            Past Medical History:  Diagnosis Date   ADHD    Anxiety    Carpal tunnel syndrome on right    Depression    HTN (hypertension)    Hypothyroidism    Migraines    Spondylosis    Stroke [facility])    Past Surgical History:  Procedure Laterality Date   ANTERIOR CRUCIATE LIGAMENT REPAIR Left 2021   APPENDECTOMY     BUBBLE STUDY  08/17/2020   Procedure: BUBBLE STUDY;  Surgeon: Jodelle Red, MD;  Location: [facility] ENDOSCOPY;  Service: Cardiovascular;;   FALSE ANEURYSM REPAIR Right 08/15/2020   Procedure: REPAIR RIGHT FEMORAL PSEUDOANEURYSM;  Surgeon: Nada Libman, MD;  Location: [facility] OR;  Service: Vascular;  Laterality: Right;   IR ANGIO INTRA EXTRACRAN SEL COM CAROTID INNOMINATE UNI L MOD SED  08/14/2020   IR ANGIO INTRA EXTRACRAN SEL INTERNAL CAROTID UNI R MOD SED  08/14/2020   IR ANGIO VERTEBRAL SEL SUBCLAVIAN INNOMINATE BILAT MOD SED  08/14/2020   IR CT HEAD LTD  08/14/2020   IR CT HEAD LTD  08/14/2020   IR INTRA CRAN STENT  08/14/2020   IR INTRAVSC STENT CERV CAROTID W/O EMB-PROT MOD SED INC ANGIO  08/14/2020   IR RADIOLOGIST EVAL & MGMT  09/24/2020   PATENT FORAMEN OVALE(PFO) CLOSURE N/A 10/24/2020   Procedure: PATENT FORAMEN OVALE (PFO) CLOSURE;  Surgeon: Tonny Bollman, MD;  Location: [facility] INVASIVE CV LAB;  Service: Cardiovascular;  Laterality: N/A;   RADIOLOGY WITH ANESTHESIA N/A 08/14/2020   Procedure: IR WITH ANESTHESIA;  Surgeon:  Julieanne Cotton, MD;  Location: [facility] OR;  Service: Radiology;  Laterality: N/A;   TEE WITHOUT CARDIOVERSION N/A 08/17/2020   Procedure: TRANSESOPHAGEAL ECHOCARDIOGRAM (TEE);  Surgeon: Jodelle Red, MD;  Location: [facility] ENDOSCOPY;  Service: Cardiovascular;  Laterality: N/A;   TUBAL LIGATION     Patient Active Problem List   Diagnosis Date Noted   Shoulder injury 06/30/2022   Diabetes mellitus without complication (HCC) 02/09/2022   Obesity (BMI 30.0-34.9) 11/12/2021   Anxiety 04/10/2021   Decreased hearing of left ear 04/10/2021   Depression 01/02/2021   Essential hypertension 01/02/2021   Right middle cerebral artery stroke (HCC) 08/22/2020   Hypothyroidism    Hyperlipidemia    PFO (patent foramen ovale)    Internal carotid artery occlusion, right 08/14/2020   Acute right MCA stroke (HCC) 08/12/2020   Cerebrovascular accident (CVA) (HCC)     PCP: [name], [name], MD   REFERRING PROVIDER: [name], [name], MD   REFERRING DIAG: S49.92XA (ICD-10-CM) - Injury of left shoulder, initial encounter   THERAPY DIAG:  Stiffness of left shoulder, not elsewhere classified  Muscle weakness (generalized)  Left shoulder pain, unspecified chronicity  Rationale for Evaluation and Treatment: Rehabilitation  ONSET DATE: 06/25/2022  SUBJECTIVE:                                                                                                                                                                                      SUBJECTIVE STATEMENT:  Pt had no adverse effects after prior Rx.  Pt denies pain currently.  Pt states she is feeling better since having the injection.  Pt has been careful with using L UE.   Pt reports compliance with HEP.  FUNCTIONAL IMPROVEMENTS:  putting hand on steering wheel and driving, lifting a pot, pain, reaching out is a little better, applying deodorant.  FUNCTIONAL LIMITATIONS:  reaching overhead, unable to perform overhead activities, picking up  objects including grocery bag, donning/doffing jacket, hair care Hand dominance: Right  PERTINENT HISTORY: CVA in 08/22.  Pt had surgery including a stent-Pt still has residual numbness in L foot Patent foramen ovale closure 10/24/2020 Anxiety and depression HTN, DM, and CTS on R L ACL repair 2021   PAIN:  Are you having pain? No NPRS:  0/10 current, 3/10 worst, 0/10 best Location:  L superior/lateral/post shoulder, medial biceps and distal L UE  PRECAUTIONS: Other: CVA in 08/22   WEIGHT BEARING RESTRICTIONS: No  FALLS:  Has patient fallen in last 6 months? No  LIVING ENVIRONMENT: Lives with: lives with their spouse Lives in: 2 story home Stairs: yes  OCCUPATION: Pt not working.  Pt volunteers at church.    PLOF: Independent.  Pt was able to perform all of her ADLs/IADLs and reaching activities independently without difficulty.    PATIENT GOALS:improved mobility and strength.    OBJECTIVE:   DIAGNOSTIC FINDINGS:  Shoulder x rays on 6/20: FINDINGS: No evidence for an acute fracture. No findings to suggest shoulder separation or dislocation. Degenerative changes are noted at the acromioclavicular joint. Cortical irregularity at the rotator cuff insertion of the humeral head is compatible with degenerative change.   IMPRESSION: Degenerative changes without acute bony findings.  Elbow x rays on 6/20: FINDINGS: No evidence for an acute fracture. No subluxation or dislocation. Degenerative spurring is seen on the coronoid process of the ulna. No fat pad elevation to suggest joint effusion.   IMPRESSION: Degenerative changes without acute bony findings.   No joint effusion.    TODAY'S TREATMENT:  UPPER EXTREMITY ROM:    AROM/PROM Right eval Left eval Left 9/3 Left 10/7 Left 10/14  Shoulder flexion 176 65 with  pain/126 92 in standing / 140 AROM in supine / 120 PROM 82 in standing /130 in supine 90 in standing / 130 in supine  Shoulder scaption 172 72 with pain 79 with min pain though more fatigue 88 80  Shoulder abduction 145 64 with pain/86 72 with pain 74 74 with pain  Shoulder adduction          Shoulder internal rotation 65 70    55  Shoulder external rotation 74 55/62 72 AROM  65  Elbow flexion          Elbow extension          Wrist flexion          Wrist extension          Wrist ulnar deviation          Wrist radial deviation          Wrist pronation          Wrist supination          (Blank rows = not tested)   Pt has a compensatory shoulder hike on L with UE elevation     ER lag test:  Pt does have slight movement though has much improved control.    Reviewed pain levels, response to prior Rx, and pt presentation.    Pt received PROM L shoulder in flex and scaption.   Therapeutic Exercise: Pulleys in flexion and scaption 2 min ea Supine wand flexion x 12  with 2# dowel Supine shoulder ABC x 1 reps Supine rhythmic stabs 3x30 sec Submax ER isometrics with 3-5 sec hold 2 x 5 reps  See below for pt education Reviewed HEP.   FOTO:  Initial/Prior/Current:  28/38/47.  Goal of 57.     PATIENT EDUCATION: Education details:  dx, objective findings, exercise form and rationale, HEP, POC, and relevant anatomy.  PT answered pt's questions.  Person educated: Patient Education method: Explanation, Demonstration, Tactile cues, Verbal cues Education comprehension: verbalized understanding, returned demonstration, verbal cues required, and tactile cues required  HOME EXERCISE PROGRAM: Access Code: [email or web address] URL: [email or web address] Date: 07/29/2022 Prepared by: Aaron Edelman  Exercises - Supine Shoulder Flexion AAROM   - 2 x daily - 7 x weekly - 1-2 sets - 10 reps - Seated Scapular Retraction  - 2 x daily - 7 x weekly - 2 sets - 10 reps - Seated Shoulder  Flexion Towel Slide at Table Top  - 2 x daily - 7 x weekly - 1 sets - 5 reps  ASSESSMENT:  CLINICAL IMPRESSION:  Pt has improved pain and functional usage of L UE though is not making progress with UE elevation.  She continues to be very limited with UE elevation and has a compensatory shoulder hike when performing UE elevation.  Pt is limited with reaching activities and unable to perform overhead activities.  She reports improved ability to drive, lift a pot, and apply deodorant.  She is still limited with activities including picking up grocery bags, performing hair care, and donning/doffing jacket.  Pt has made progress overall toward her goals, though not much progress recently except meeting STG #3.  Pt demonstrates improved self perceived disability as evidenced by increased FOTO score.  Pt is scheduled to have a MRI on 10/24 and will return to PT after MRI.  Pt will perform her HEP in  the interim.     OBJECTIVE IMPAIRMENTS: decreased activity tolerance, decreased ROM, decreased strength, hypomobility, impaired flexibility, impaired UE functional use, and pain.   ACTIVITY LIMITATIONS: carrying, lifting, bed mobility, bathing, dressing, reach over head, and hygiene/grooming  PARTICIPATION LIMITATIONS: meal prep, cleaning, laundry, shopping, and community activity  PERSONAL FACTORS: 1 comorbidity: CVA in 2022  are also affecting patient's functional outcome.   REHAB POTENTIAL: Good  CLINICAL DECISION MAKING: Stable/uncomplicated  EVALUATION COMPLEXITY: Low   GOALS:  SHORT TERM GOALS: Target date:  08/19/2022  Pt will report at least a 25% improvement in functional usage of L UE with reduced pain. Baseline: Goal status: MET 8/27  2.  Pt will demo at least a 20-25 deg improvement in L shoulder flexion and scaption AROM for improved UE elevation and reaching. Baseline:  Goal status: 50% MET  3.  Pt will be independent and compliant with HEP for improved pain, ROM, strength, and  function.  Baseline:  Goal status: GOAL MET 10/20/22  4.  Pt will be able to use L UE with driving with increased ease. Baseline:  Goal status: MET 8/27 Target date:  08/26/2022    LONG TERM GOALS: Target date: 11/24/2022  Pt will demo improved flexion and scaption AROM to at least 120 deg with significantly improved compensatory shoulder hike for improved reaching and performance of overhead activities. Baseline:  Goal status: ONGOING  2.  Pt will be able to perform hair care without significant pain and difficulty.  Baseline:  Goal status: NOT MET  3.  Pt will be able to reach overhead into a cabinet. Baseline:  Goal status: NOT MET  4.  Pt will report she is able to perform bathing and dressing without significant difficulty.  Baseline:  Goal status: MET 8/27  5.  Pt will report improved usage of L UE with household chores with reduced pain.  Baseline:  Goal status: MET 8/27   PLAN:  PT FREQUENCY: 1-2x/wk after MRI  PT DURATION: 5 weeks  PLANNED INTERVENTIONS: Therapeutic exercises, Therapeutic activity, Neuromuscular re-education, Patient/Family education, Self Care, Joint mobilization, Aquatic Therapy, Dry Needling, Electrical stimulation, Spinal mobilization, Cryotherapy, Moist heat, Taping, Ultrasound, Manual therapy, and Re-evaluation  PLAN FOR NEXT SESSION: Pt to be on hold until she has a MRI.  MRI scheduled for 10/30/22.   Audie Clear III PT, DPT 10/21/22 3:38 PM

## 2022-10-21 ENCOUNTER — Encounter (HOSPITAL_BASED_OUTPATIENT_CLINIC_OR_DEPARTMENT_OTHER): Payer: Self-pay | Admitting: Physical Therapy

## 2022-10-22 IMAGING — MR MR HEAD W/O CM
7 of 10 series · 39 of 48 positions shown · non-contrast
Comparison: Brain MRI 08/13/2020

CLINICAL DATA: Dizziness, pressure in head

EXAM:
MRI HEAD WITHOUT CONTRAST
TECHNIQUE: Multiplanar, multiecho pulse sequences of the brain and surrounding
structures were obtained without intravenous contrast.

[Series 6: DWI · axial · 3.0mm · 1.09mm/px · z∈[-77,+70]mm · 11 of 100 slices shown (1 of 4)]
[im 1/100]
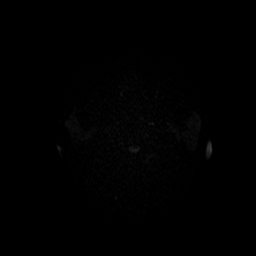
[im 10/100]
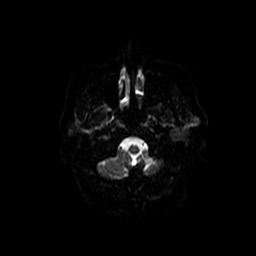
[im 20/100]
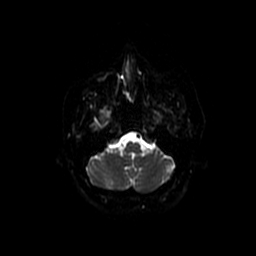
[im 30/100]
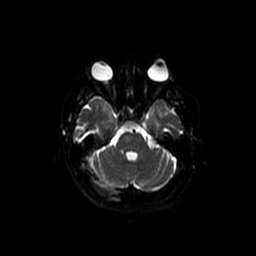
[im 40/100]
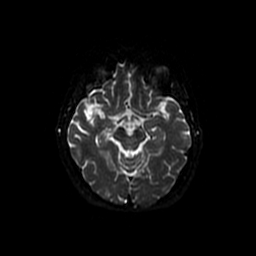
[im 50/100]
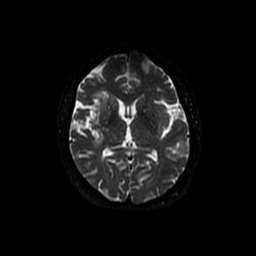
[im 60/100]
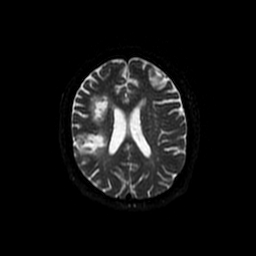
[im 70/100]
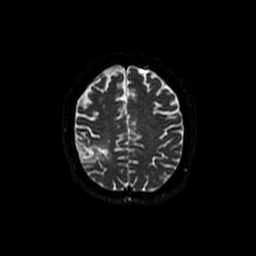
[im 80/100]
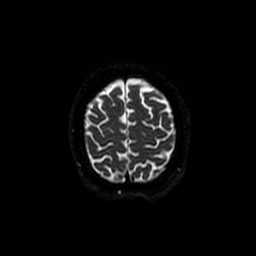
[im 90/100]
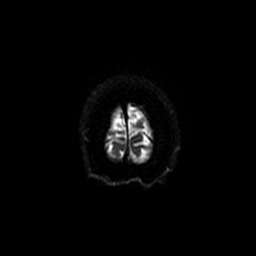
[im 100/100]
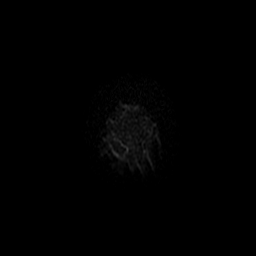

[Series 8: DWI · coronal · 5.0mm · 1.09mm/px · 8 of 70 slices shown (2 of 4)]
[im 1/70]
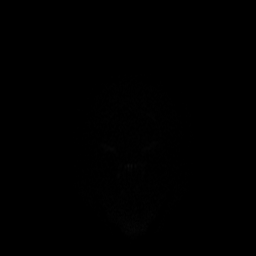
[im 10/70]
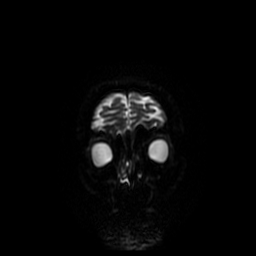
[im 20/70]
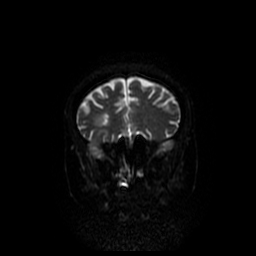
[im 30/70]
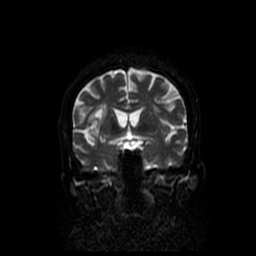
[im 40/70]
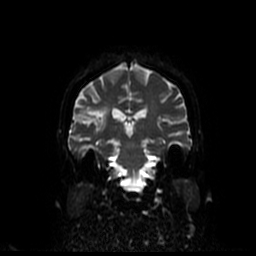
[im 50/70]
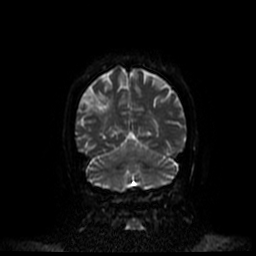
[im 60/70]
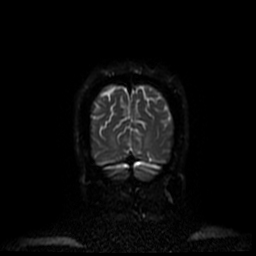
[im 70/70]
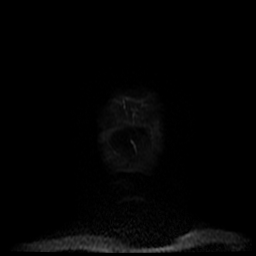

[Series 9: T2 · axial · 5.0mm · 0.43mm/px · z∈[-76,+68]mm · 3 of 25 slices shown (1 of 2)]
[im 1/25]
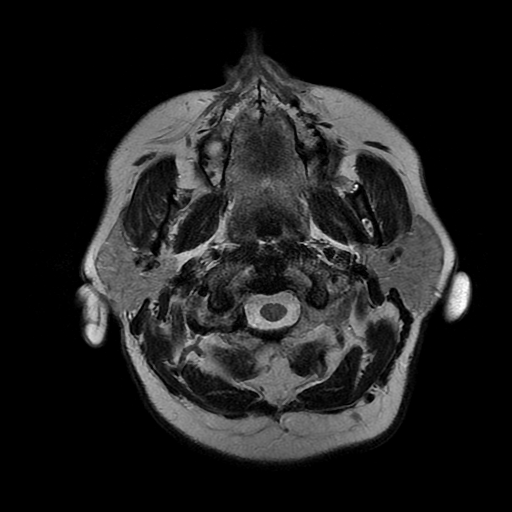
[im 13/25]
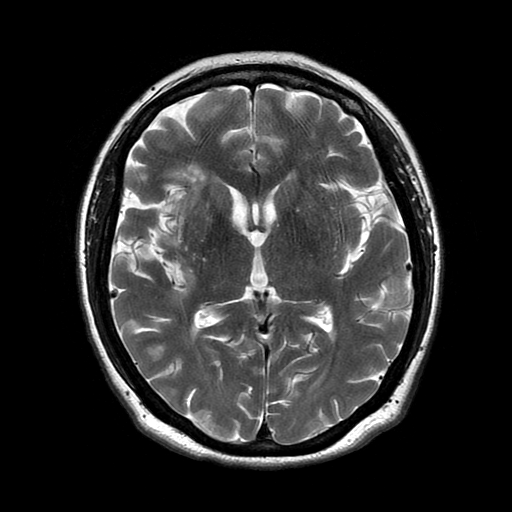
[im 25/25]
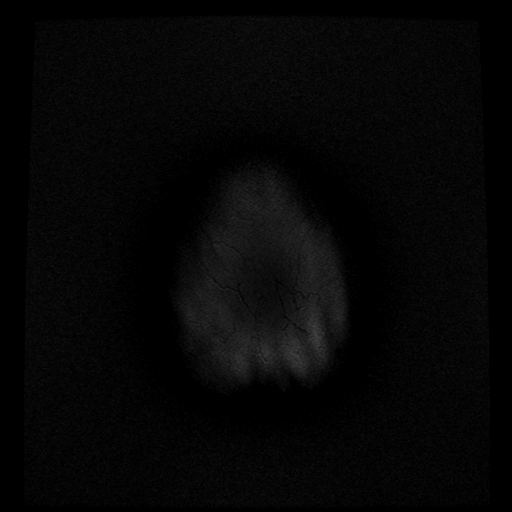

[Series 10: FLAIR · axial · 5.0mm · 0.43mm/px · z∈[-76,+68]mm · 3 of 25 slices shown]
[im 1/25]
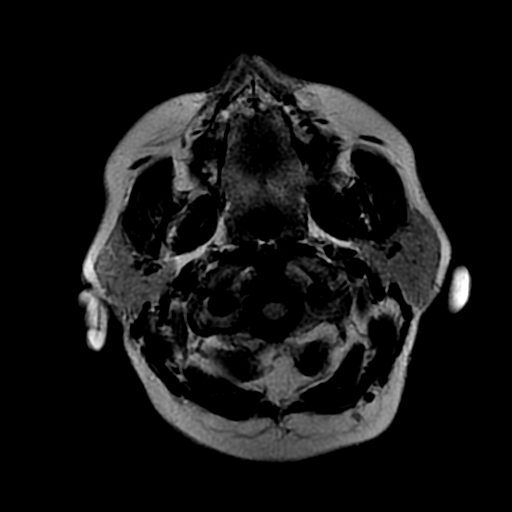
[im 13/25]
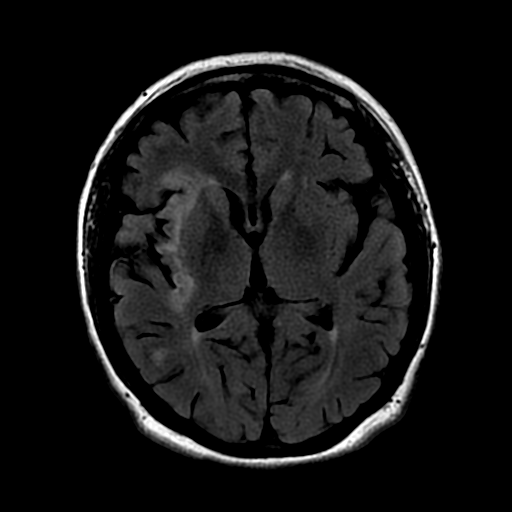
[im 25/25]
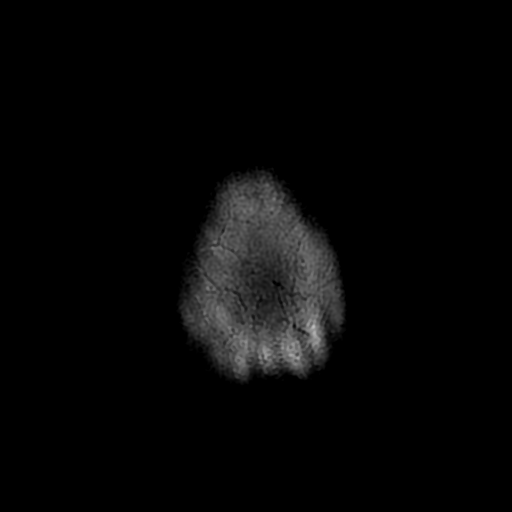

[Series 13: T2 · coronal · 5.0mm · 0.43mm/px · 4 of 30 slices shown (2 of 2)]
[im 1/30]
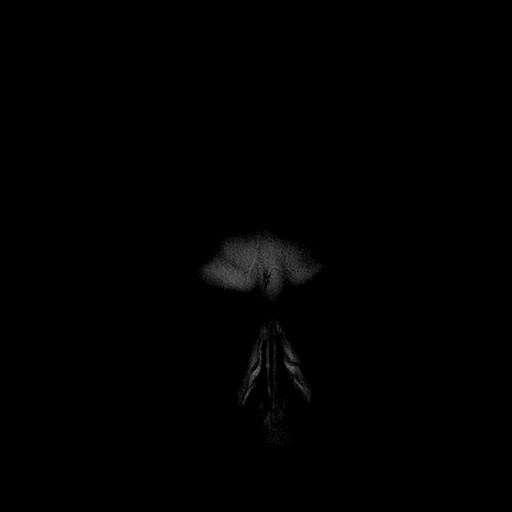
[im 10/30]
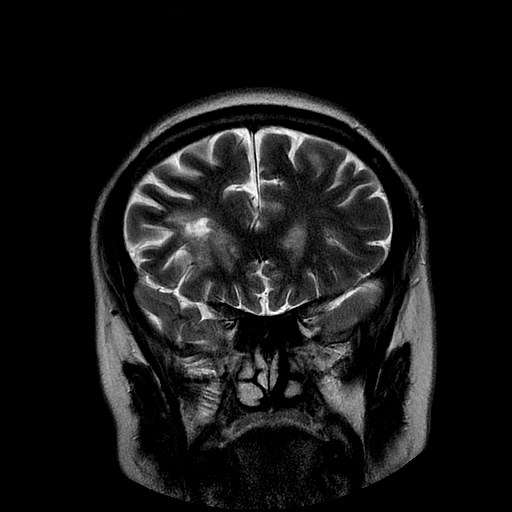
[im 20/30]
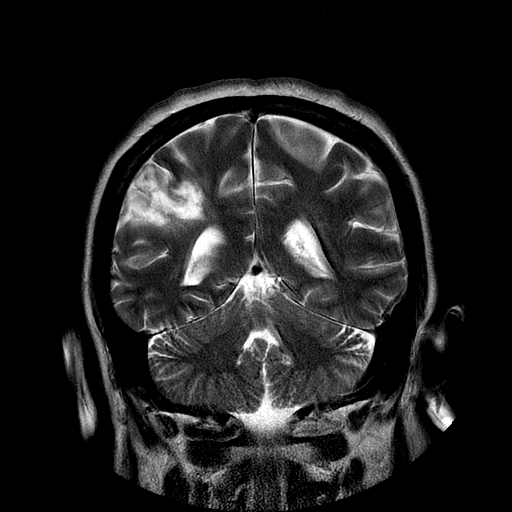
[im 30/30]
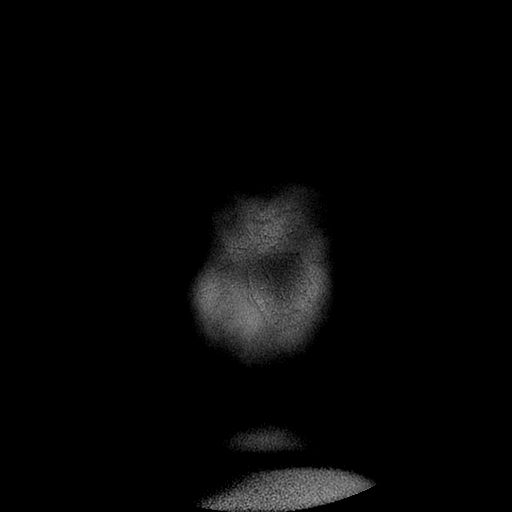

[Series 600: DWI · axial · 3.0mm · 1.09mm/px · z∈[-77,+67]mm · 6 of 49 slices shown (3 of 4)]
[im 1/49]
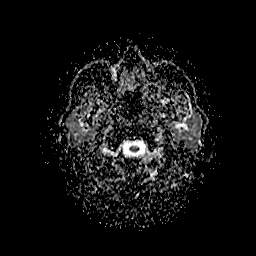
[im 10/49]
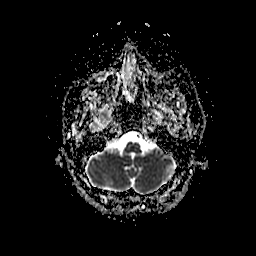
[im 20/49]
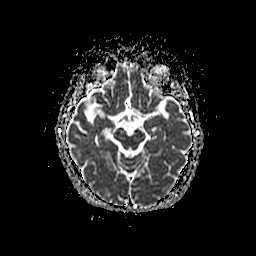
[im 29/49]
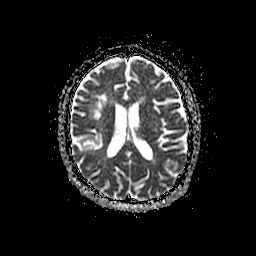
[im 39/49]
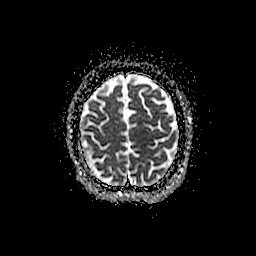
[im 49/49]
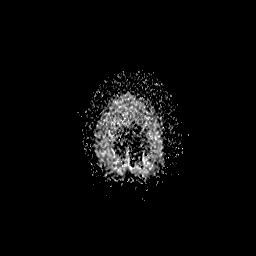

[Series 800: DWI · coronal · 5.0mm · 1.09mm/px · 4 of 35 slices shown (4 of 4)]
[im 1/35]
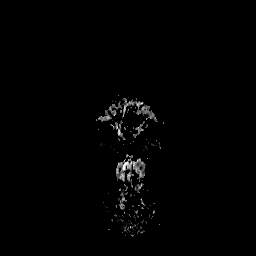
[im 12/35]
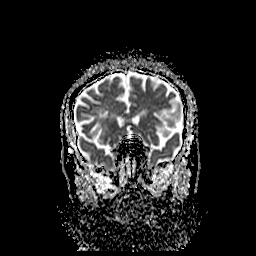
[im 23/35]
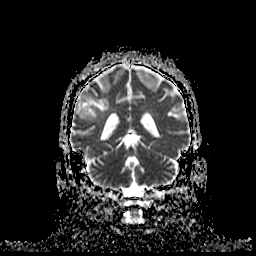
[im 35/35]
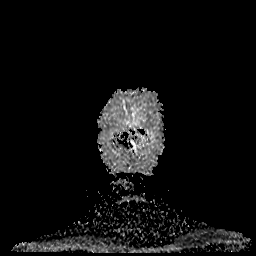

[39 of 48 positions shown; findings below may reference images not displayed]

FINDINGS: Brain: There is patchy diffusion restriction and evolving
encephalomalacia in the right MCA distribution consistent with
evolving late subacute infarcts, seen on the prior MRI from
08/13/2020. There are small foci of petechial hemorrhage in the
right parietal lobe, also present on the prior study.

There is no evidence of new acute infarct. There is no new acute
intracranial hemorrhage. Additional foci of FLAIR signal abnormality
in the bilateral cerebral hemisphere white matter are nonspecific
but likely reflects sequela of chronic white matter microangiopathy.
The ventricles are not enlarged. There is no midline shift. There is
no mass lesion.

Vascular: Normal flow voids.

Skull and upper cervical spine: Normal marrow signal.

Sinuses/Orbits: The paranasal sinuses are clear. The globes and
orbits are unremarkable.

Other: None.
IMPRESSION: 1. Expected evolution of now late subacute infarcts in the right MCA
distribution with increasing encephalomalacia and stable small foci
of petechial hemorrhage.
2. No new infarct or intracranial hemorrhage.

## 2022-10-24 ENCOUNTER — Ambulatory Visit (HOSPITAL_BASED_OUTPATIENT_CLINIC_OR_DEPARTMENT_OTHER): Payer: Managed Care, Other (non HMO)

## 2022-10-27 ENCOUNTER — Ambulatory Visit (HOSPITAL_BASED_OUTPATIENT_CLINIC_OR_DEPARTMENT_OTHER): Payer: Managed Care, Other (non HMO)

## 2022-10-30 ENCOUNTER — Other Ambulatory Visit: Payer: Managed Care, Other (non HMO)

## 2022-10-31 ENCOUNTER — Ambulatory Visit (HOSPITAL_BASED_OUTPATIENT_CLINIC_OR_DEPARTMENT_OTHER): Payer: Managed Care, Other (non HMO)

## 2022-11-02 ENCOUNTER — Other Ambulatory Visit (HOSPITAL_BASED_OUTPATIENT_CLINIC_OR_DEPARTMENT_OTHER): Payer: Self-pay | Admitting: Family Medicine

## 2022-11-03 ENCOUNTER — Ambulatory Visit (HOSPITAL_BASED_OUTPATIENT_CLINIC_OR_DEPARTMENT_OTHER): Payer: Managed Care, Other (non HMO)

## 2022-11-05 ENCOUNTER — Other Ambulatory Visit (HOSPITAL_BASED_OUTPATIENT_CLINIC_OR_DEPARTMENT_OTHER): Payer: Self-pay | Admitting: Family Medicine

## 2022-11-05 DIAGNOSIS — E785 Hyperlipidemia, unspecified: Secondary | ICD-10-CM

## 2022-11-07 ENCOUNTER — Ambulatory Visit (HOSPITAL_BASED_OUTPATIENT_CLINIC_OR_DEPARTMENT_OTHER): Payer: Managed Care, Other (non HMO) | Attending: Family Medicine | Admitting: Physical Therapy

## 2022-11-07 ENCOUNTER — Encounter (HOSPITAL_BASED_OUTPATIENT_CLINIC_OR_DEPARTMENT_OTHER): Payer: Self-pay | Admitting: Physical Therapy

## 2022-11-07 DIAGNOSIS — M25612 Stiffness of left shoulder, not elsewhere classified: Secondary | ICD-10-CM | POA: Insufficient documentation

## 2022-11-07 DIAGNOSIS — M25512 Pain in left shoulder: Secondary | ICD-10-CM | POA: Diagnosis present

## 2022-11-07 DIAGNOSIS — M6281 Muscle weakness (generalized): Secondary | ICD-10-CM | POA: Insufficient documentation

## 2022-11-07 NOTE — Therapy (Addendum)
 OUTPATIENT PHYSICAL THERAPY TREATMENT   Patient Name: Vicki Gonzales MRN: [id number] DOB:1965-09-13, 57 y.o., female Today's Date: 11/07/2022  END OF SESSION:  PT End of Session - 11/07/22 1114     Visit Number 21    Number of Visits 25    Date for PT Re-Evaluation 11/24/22    Authorization Type [employer]    PT Start Time 1110    PT Stop Time 1143    PT Time Calculation (min) 33 min    Activity Tolerance Patient tolerated treatment well    Behavior During Therapy WFL for tasks assessed/performed                            Past Medical History:  Diagnosis Date   ADHD    Anxiety    Carpal tunnel syndrome on right    Depression    HTN (hypertension)    Hypothyroidism    Migraines    Spondylosis    Stroke [facility])    Past Surgical History:  Procedure Laterality Date   ANTERIOR CRUCIATE LIGAMENT REPAIR Left 2021   APPENDECTOMY     BUBBLE STUDY  08/17/2020   Procedure: BUBBLE STUDY;  Surgeon: Sheryle Donning, MD;  Location: [facility] ENDOSCOPY;  Service: Cardiovascular;;   FALSE ANEURYSM REPAIR Right 08/15/2020   Procedure: REPAIR RIGHT FEMORAL PSEUDOANEURYSM;  Surgeon: Margherita Shell, MD;  Location: [facility] OR;  Service: Vascular;  Laterality: Right;   IR ANGIO INTRA EXTRACRAN SEL COM CAROTID INNOMINATE UNI L MOD SED  08/14/2020   IR ANGIO INTRA EXTRACRAN SEL INTERNAL CAROTID UNI R MOD SED  08/14/2020   IR ANGIO VERTEBRAL SEL SUBCLAVIAN INNOMINATE BILAT MOD SED  08/14/2020   IR CT HEAD LTD  08/14/2020   IR CT HEAD LTD  08/14/2020   IR INTRA CRAN STENT  08/14/2020   IR INTRAVSC STENT CERV CAROTID W/O EMB-PROT MOD SED INC ANGIO  08/14/2020   IR RADIOLOGIST EVAL & MGMT  09/24/2020   PATENT FORAMEN OVALE(PFO) CLOSURE N/A 10/24/2020   Procedure: PATENT FORAMEN OVALE (PFO) CLOSURE;  Surgeon: [name], MD;  Location: [facility] INVASIVE CV LAB;  Service: Cardiovascular;  Laterality: N/A;   RADIOLOGY WITH ANESTHESIA N/A 08/14/2020   Procedure: IR WITH ANESTHESIA;  Surgeon:  [name], MD;  Location: [facility] OR;  Service: Radiology;  Laterality: N/A;   TEE WITHOUT CARDIOVERSION N/A 08/17/2020   Procedure: TRANSESOPHAGEAL ECHOCARDIOGRAM (TEE);  Surgeon: Sheryle Donning, MD;  Location: [facility] ENDOSCOPY;  Service: Cardiovascular;  Laterality: N/A;   TUBAL LIGATION     Patient Active Problem List   Diagnosis Date Noted   Shoulder injury 06/30/2022   Diabetes mellitus without complication (HCC) 02/09/2022   Obesity (BMI 30.0-34.9) 11/12/2021   Anxiety 04/10/2021   Decreased hearing of left ear 04/10/2021   Depression 01/02/2021   Essential hypertension 01/02/2021   Right middle cerebral artery stroke (HCC) 08/22/2020   Hypothyroidism    Hyperlipidemia    PFO (patent foramen ovale)    Internal carotid artery occlusion, right 08/14/2020   Acute right MCA stroke (HCC) 08/12/2020   Cerebrovascular accident (CVA) (HCC)     PCP: [name], [name], MD   REFERRING PROVIDER: [name], [name], MD   REFERRING DIAG: S49.92XA (ICD-10-CM) - Injury of left shoulder, initial encounter   THERAPY DIAG:  Stiffness of left shoulder, not elsewhere classified  Muscle weakness (generalized)  Left shoulder pain, unspecified chronicity  Rationale for Evaluation and Treatment: Rehabilitation  ONSET DATE: 06/25/2022  SUBJECTIVE:                                                                                                                                                                                      SUBJECTIVE STATEMENT:  Pt states the MRI was cancelled the day before it was scheduled due to insurance.  Pt plans to go speak to orthopedics when leaving therapy.   Pt reports no difference with reaching into cabinets and the fridge.  She is still limited with reaching.  Pt has pain if trying to lift objects and limits her lifting.  Pt states she is able to reach behind her head better.    Pt denies pain currently.  Pt reports compliance with HEP.  Pt is  appreciative of skilled PT services.  She wants to wait on any further PT until she has a MRI.  Hand dominance: Right  PERTINENT HISTORY: CVA in 08/22.  Pt had surgery including a stent-Pt still has residual numbness in L foot Patent foramen ovale closure 10/24/2020 Anxiety and depression HTN, DM, and CTS on R L ACL repair 2021   PAIN:  Are you having pain? No NPRS:  0/10 current, 3/10 worst, 0/10 best Location:  L superior/lateral/post shoulder, medial biceps and distal L UE  PRECAUTIONS: Other: CVA in 08/22   WEIGHT BEARING RESTRICTIONS: No  FALLS:  Has patient fallen in last 6 months? No  LIVING ENVIRONMENT: Lives with: lives with their spouse Lives in: 2 story home Stairs: yes  OCCUPATION: Pt not working.  Pt volunteers at church.    PLOF: Independent.  Pt was able to perform all of her ADLs/IADLs and reaching activities independently without difficulty.    PATIENT GOALS:improved mobility and strength.    OBJECTIVE:   DIAGNOSTIC FINDINGS:  Shoulder x rays on 6/20: FINDINGS: No evidence for an acute fracture. No findings to suggest shoulder separation or dislocation. Degenerative changes are noted at the acromioclavicular joint. Cortical irregularity at the rotator cuff insertion of the humeral head is compatible with degenerative change.   IMPRESSION: Degenerative changes without acute bony findings.  Elbow x rays on 6/20: FINDINGS: No evidence for an acute fracture. No subluxation or dislocation. Degenerative spurring is seen on the coronoid process of the ulna. No fat pad elevation to suggest joint effusion.   IMPRESSION: Degenerative changes without acute bony findings.   No joint effusion.    TODAY'S TREATMENT:  UPPER EXTREMITY ROM:    AROM/PROM Right eval Left eval Left 9/3 Left 10/7 Left 10/14  Left 11/1  Shoulder flexion 176 65 with pain/126 92 in standing / 140 AROM in supine / 120 PROM 82 in standing /130 in supine 90 in standing / 130 in supine 102 in standing with shoulder hike ; 141 deg AAROM in standing with wall   Shoulder scaption 172 72 with pain 79 with min pain though more fatigue 88 80 99 with shoulder hike  Shoulder abduction 145 64 with pain/86 72 with pain 74 74 with pain   Shoulder adduction           Shoulder internal rotation 65 70    55   Shoulder external rotation 74 55/62 72 AROM  65   Elbow flexion           Elbow extension           Wrist flexion           Wrist extension           Wrist ulnar deviation           Wrist radial deviation           Wrist pronation           Wrist supination           (Blank rows = not tested)   Pt has a compensatory shoulder hike on L with UE elevation   Therapeutic Exercise: Pulleys in flexion and scaption 2 min ea Standing wall walks in flexion x 7 reps Standing press out x10, 6 reps Supine shoulder ABC x 1 rep Pt unable to perform S/L ER with correct form  See below for pt education Reviewed HEP.     PATIENT EDUCATION: Education details:  dx, objective findings, exercise form and rationale, HEP, POC, and relevant anatomy.  PT answered pt's questions.  Person educated: Patient Education method: Explanation, Demonstration, Tactile cues, Verbal cues Education comprehension: verbalized understanding, returned demonstration, verbal cues required, and tactile cues required  HOME EXERCISE PROGRAM: Access Code: [email or web address] URL: [email or web address] Date: 07/29/2022 Prepared by: Marnie Siren   ASSESSMENT:  CLINICAL IMPRESSION:  Pt was unable to have the MRI due to insurance authorization.  Pt is still limited with reaching and UE elevation.  She reports no change with reaching overhead into the fridge or cabinet though has improved with reaching behind head to fix her hair.  Pt continues to be  limited with L UE elevation though demonstrates improved flexion and scaption AROM in standing.  She continues to have significant compensatory shoulder hike with UE elevation.  Pt demonstrates improved standing flexion AAROM with wall/ladder walks.  Pt met STG #2.  PT reviewed her HEP and updated HEP.  Pt received a HEP handout and was educated in correct form and appropriate frequency.  Pt is independent with HEP.  Pt is independent with HEP.  Pt responded well to Rx having no c/o's after Rx.      OBJECTIVE IMPAIRMENTS: decreased activity tolerance, decreased ROM, decreased strength, hypomobility, impaired flexibility, impaired UE functional use, and pain.   ACTIVITY LIMITATIONS: carrying, lifting, bed mobility, bathing, dressing, reach over head, and hygiene/grooming  PARTICIPATION LIMITATIONS: meal prep, cleaning, laundry, shopping, and community activity  PERSONAL FACTORS: 1 comorbidity: CVA in 2022  are also affecting patient's functional outcome.   REHAB POTENTIAL: Good  CLINICAL DECISION MAKING: Stable/uncomplicated  EVALUATION COMPLEXITY: Low   GOALS:  SHORT TERM GOALS: Target date:  08/19/2022  Pt will report at least a 25% improvement in functional usage of L UE with reduced pain. Baseline: Goal status: MET 8/27  2.  Pt will demo at least a 20-25 deg improvement in L shoulder flexion and scaption AROM for improved UE elevation and reaching. Baseline:  Goal status:  GOAL MET  11/1  3.  Pt will be independent and compliant with HEP for improved pain, ROM, strength, and function.  Baseline:  Goal status: GOAL MET 10/20/22  4.  Pt will be able to use L UE with driving with increased ease. Baseline:  Goal status: MET 8/27 Target date:  08/26/2022    LONG TERM GOALS: Target date: 11/24/2022  Pt will demo improved flexion and scaption AROM to at least 120 deg with significantly improved compensatory shoulder hike for improved reaching and performance of overhead  activities. Baseline:  Goal status: ONGOING  2.  Pt will be able to perform hair care without significant pain and difficulty.  Baseline:  Goal status: NOT MET  3.  Pt will be able to reach overhead into a cabinet. Baseline:  Goal status: NOT MET  4.  Pt will report she is able to perform bathing and dressing without significant difficulty.  Baseline:  Goal status: MET 8/27  5.  Pt will report improved usage of L UE with household chores with reduced pain.  Baseline:  Goal status: MET 8/27   PLAN:  PT FREQUENCY: 1-2x/wk after MRI  PT DURATION: 5 weeks  PLANNED INTERVENTIONS: Therapeutic exercises, Therapeutic activity, Neuromuscular re-education, Patient/Family education, Self Care, Joint mobilization, Aquatic Therapy, Dry Needling, Electrical stimulation, Spinal mobilization, Cryotherapy, Moist heat, Taping, Ultrasound, Manual therapy, and Re-evaluation  PLAN FOR NEXT SESSION: Pt and PT agree for pt to be on hold until she has a MRI.  Pt will cont with HEP.  Trina Fujita III PT, DPT 11/07/22 1:52 PM  PHYSICAL THERAPY DISCHARGE SUMMARY  Visits from Start of Care: 21  Current functional level related to goals / functional outcomes: See above   Remaining deficits: See above   Education / Equipment: See above   Patient was seen in PT from 07/29/22  to 11/07/22.  Pt had made improvement in functional usage of L UE though was still limited with reaching activities and unable to perform overhead activities.  She continued to have significant compensatory shoulder hike with UE elevation.  The plan was for pt to be on hold until she has a MRI.  PA ordered a MRI  though Pt was unable to have the MRI due to insurance authorization.  Pt will be considered discharged at this time.    Trina Fujita III PT, DPT 04/25/23 2:34 PM

## 2022-11-27 ENCOUNTER — Encounter (HOSPITAL_BASED_OUTPATIENT_CLINIC_OR_DEPARTMENT_OTHER): Payer: Self-pay | Admitting: Family Medicine

## 2022-12-01 ENCOUNTER — Other Ambulatory Visit (HOSPITAL_BASED_OUTPATIENT_CLINIC_OR_DEPARTMENT_OTHER): Payer: Self-pay | Admitting: Family Medicine

## 2022-12-14 ENCOUNTER — Other Ambulatory Visit (HOSPITAL_BASED_OUTPATIENT_CLINIC_OR_DEPARTMENT_OTHER): Payer: Self-pay | Admitting: Family Medicine

## 2022-12-14 DIAGNOSIS — E039 Hypothyroidism, unspecified: Secondary | ICD-10-CM

## 2022-12-16 ENCOUNTER — Encounter (HOSPITAL_BASED_OUTPATIENT_CLINIC_OR_DEPARTMENT_OTHER): Payer: Self-pay | Admitting: Family Medicine

## 2022-12-16 ENCOUNTER — Ambulatory Visit (INDEPENDENT_AMBULATORY_CARE_PROVIDER_SITE_OTHER): Payer: Managed Care, Other (non HMO) | Admitting: Family Medicine

## 2022-12-16 VITALS — BP 122/82 | HR 74 | Ht 66.0 in | Wt 201.9 lb

## 2022-12-16 DIAGNOSIS — E039 Hypothyroidism, unspecified: Secondary | ICD-10-CM

## 2022-12-16 DIAGNOSIS — E785 Hyperlipidemia, unspecified: Secondary | ICD-10-CM

## 2022-12-16 DIAGNOSIS — M545 Low back pain, unspecified: Secondary | ICD-10-CM

## 2022-12-16 DIAGNOSIS — E119 Type 2 diabetes mellitus without complications: Secondary | ICD-10-CM | POA: Diagnosis not present

## 2022-12-16 DIAGNOSIS — Z0000 Encounter for general adult medical examination without abnormal findings: Secondary | ICD-10-CM

## 2022-12-16 LAB — POCT GLYCOSYLATED HEMOGLOBIN (HGB A1C)
HbA1c POC (<> result, manual entry): 6.1 % (ref 4.0–5.6)
HbA1c, POC (controlled diabetic range): 6.1 % (ref 0.0–7.0)
HbA1c, POC (prediabetic range): 6.1 % (ref 5.7–6.4)

## 2022-12-16 MED ORDER — LISINOPRIL 5 MG PO TABS: 5.0000 mg | ORAL_TABLET | Freq: Every day | ORAL | 1 refills | Status: DC

## 2022-12-16 MED ORDER — ROSUVASTATIN CALCIUM 20 MG PO TABS: 20.0000 mg | ORAL_TABLET | Freq: Every day | ORAL | 1 refills | Status: DC

## 2022-12-16 NOTE — Assessment & Plan Note (Signed)
Hemoglobin A1c was at goal about 5 months ago.  No reported issues with polyuria or polydipsia.  She continues with metformin, indicates that she has been tolerating medication well.  Denies any GI symptoms or side effects. Urine microalbumin/creatinine ratio completed earlier this year and was normal Foot exam up-to-date We will continue with metformin at current dose, no changes made today.  We will check hemoglobin A1c today for monitoring

## 2022-12-16 NOTE — Patient Instructions (Addendum)
  Medication Instructions:  Your physician recommends that you continue on your current medications as directed. Please refer to the Current Medication list given to you today. --If you need a refill on any your medications before your next appointment, please call your pharmacy first. If no refills are authorized on file call the office.-- Lab Work:  If you have labs (blood work) drawn today and your tests are completely normal, you will receive your results via MyChart message OR a phone call from our staff.  Please ensure you check your voicemail in the event that you authorized detailed messages to be left on a delegated number. If you have any lab test that is abnormal or we need to change your treatment, we will call you to review the results.  Follow-Up: Your next appointment:   Your physician recommends that you schedule a follow-up appointment in: 4 months with labs 1 week before with Dr. [name]  You will receive a text message or e-mail with a link to a survey about your care and experience with [place] today! We would greatly appreciate your feedback!   Thanks for letting us be apart of your health journey!!  Primary Care and Sports Medicine   Dr. Ceasar Mons [place]   We encourage you to activate your patient portal called "MyChart".  Sign up information is provided on this After Visit Summary.  MyChart is used to connect with patients for Virtual Visits (Telemedicine).  Patients are able to view lab/test results, encounter notes, upcoming appointments, etc.  Non-urgent messages can be sent to your provider as well. To learn more about what you can do with MyChart, please visit --  [email or web address].

## 2022-12-16 NOTE — Progress Notes (Signed)
Procedures performed today:    None.  Independent interpretation of notes and tests performed by another provider:   None.  Brief History, Exam, Impression, and Recommendations:    BP 122/82 (BP Location: Right Arm, Patient Position: Sitting, Cuff Size: Normal)   Pulse 74   Ht 5\' 6"  (1.676 m)   Wt 201 lb 14.4 oz (91.6 kg)   LMP 04/07/2019 (Approximate)   SpO2 98%   BMI 32.59 kg/m   Diabetes mellitus without complication (HCC) Assessment & Plan: Hemoglobin A1c was at goal about 5 months ago.  No reported issues with polyuria or polydipsia.  She continues with metformin, indicates that she has been tolerating medication well.  Denies any GI symptoms or side effects. Urine microalbumin/creatinine ratio completed earlier this year and was normal Foot exam up-to-date We will continue with metformin at current dose, no changes made today.  We will check hemoglobin A1c today for monitoring  Orders: -     POCT glycosylated hemoglobin (Hb A1C)  Hypothyroidism, unspecified type Assessment & Plan: Patient has been taking levothyroxine at 100 mcg.  Clinically, patient is euthyroid.  Recent TSH at goal.  Can continue with current dose of levothyroxine at this time.   Left low back pain, unspecified chronicity, unspecified whether sciatica present Assessment & Plan: Patient reports that recently she has been having left low back pain as well as some associated hip pain.  Hip pain will be over lateral hip and to some degree over anterior hip/left upper thigh.  Has not had any associated numbness or tingling.  She does report that in the past she has had some back issues in the past, has received prior injections into back with specialist.  Has not had any bowel or bladder issues associated with this.  Will occasionally use meloxicam to help with pain control. On exam, patient is in no acute distress, vital signs stable.  She does have tenderness to palpation through left paraspinal  muscles within the lumbar region.  Normal range of motion at hip, although difficult to fully assess passive range of motion due to difficulty with allowing muscles to relax.  No significant pain with resisted hip flexion or FABER or FADIR. Do feel that symptoms are more likely to be related to lumbar muscular issue and could be contributing to associated left hip pain as well.  Given history and exam, feel that she would do well with conservative measures and with physical therapy.  She is amenable to evaluation and treatment with PT, referral placed today.  Do not feel that x-rays are necessary at this time, can consider obtaining these in the future should symptoms persist despite conservative measures  Orders: -     Ambulatory referral to Physical Therapy  Hyperlipidemia, unspecified hyperlipidemia type -     Rosuvastatin Calcium; Take 1 tablet (20 mg total) by mouth daily.  Dispense: 90 tablet; Refill: 1  Wellness examination -     CBC with Differential/Platelet; Future -     Comprehensive metabolic panel; Future -     Hemoglobin A1c; Future -     Lipid panel; Future -     TSH; Future  Other orders -     Lisinopril; Take 1 tablet (5 mg total) by mouth daily.  Dispense: 90 tablet; Refill: 1  Return in about 4 months (around 04/16/2023) for CPE with fasting labs 1 week prior.   ___________________________________________ Reuben Knoblock [name], MD, ABFM, CAQSM Primary Care and Sports Medicine [facility]

## 2022-12-16 NOTE — Assessment & Plan Note (Signed)
Patient has been taking levothyroxine at 100 mcg.  Clinically, patient is euthyroid.  Recent TSH at goal.  Can continue with current dose of levothyroxine at this time.

## 2022-12-16 NOTE — Assessment & Plan Note (Signed)
Patient reports that recently she has been having left low back pain as well as some associated hip pain.  Hip pain will be over lateral hip and to some degree over anterior hip/left upper thigh.  Has not had any associated numbness or tingling.  She does report that in the past she has had some back issues in the past, has received prior injections into back with specialist.  Has not had any bowel or bladder issues associated with this.  Will occasionally use meloxicam to help with pain control. On exam, patient is in no acute distress, vital signs stable.  She does have tenderness to palpation through left paraspinal muscles within the lumbar region.  Normal range of motion at hip, although difficult to fully assess passive range of motion due to difficulty with allowing muscles to relax.  No significant pain with resisted hip flexion or FABER or FADIR. Do feel that symptoms are more likely to be related to lumbar muscular issue and could be contributing to associated left hip pain as well.  Given history and exam, feel that she would do well with conservative measures and with physical therapy.  She is amenable to evaluation and treatment with PT, referral placed today.  Do not feel that x-rays are necessary at this time, can consider obtaining these in the future should symptoms persist despite conservative measures

## 2022-12-25 ENCOUNTER — Other Ambulatory Visit (HOSPITAL_BASED_OUTPATIENT_CLINIC_OR_DEPARTMENT_OTHER): Payer: Self-pay | Admitting: Family Medicine

## 2022-12-29 ENCOUNTER — Other Ambulatory Visit (HOSPITAL_BASED_OUTPATIENT_CLINIC_OR_DEPARTMENT_OTHER): Payer: Self-pay | Admitting: Family Medicine

## 2023-01-14 ENCOUNTER — Other Ambulatory Visit: Payer: Self-pay

## 2023-01-14 ENCOUNTER — Encounter (HOSPITAL_BASED_OUTPATIENT_CLINIC_OR_DEPARTMENT_OTHER): Payer: Self-pay | Admitting: Physical Therapy

## 2023-01-14 ENCOUNTER — Ambulatory Visit (HOSPITAL_BASED_OUTPATIENT_CLINIC_OR_DEPARTMENT_OTHER): Payer: Managed Care, Other (non HMO) | Attending: Family Medicine | Admitting: Physical Therapy

## 2023-01-14 DIAGNOSIS — M6281 Muscle weakness (generalized): Secondary | ICD-10-CM | POA: Insufficient documentation

## 2023-01-14 DIAGNOSIS — R2689 Other abnormalities of gait and mobility: Secondary | ICD-10-CM | POA: Insufficient documentation

## 2023-01-14 DIAGNOSIS — M5459 Other low back pain: Secondary | ICD-10-CM | POA: Diagnosis present

## 2023-01-14 DIAGNOSIS — M545 Low back pain, unspecified: Secondary | ICD-10-CM | POA: Diagnosis present

## 2023-01-14 DIAGNOSIS — R29898 Other symptoms and signs involving the musculoskeletal system: Secondary | ICD-10-CM | POA: Diagnosis present

## 2023-01-14 NOTE — Therapy (Signed)
 OUTPATIENT PHYSICAL THERAPY THORACOLUMBAR EVALUATION   Patient Name: Vicki Gonzales MRN: [id number] DOB:05-24-1965, 58 y.o., female Today's Date: 01/14/2023  END OF SESSION:  PT End of Session - 01/14/23 1259     Visit Number 1    Authorization Type [employer]    PT Start Time 1300    PT Stop Time 1335    PT Time Calculation (min) 35 min    Activity Tolerance Patient tolerated treatment well    Behavior During Therapy WFL for tasks assessed/performed             Past Medical History:  Diagnosis Date   ADHD    Anxiety    Carpal tunnel syndrome on right    Depression    HTN (hypertension)    Hypothyroidism    Migraines    Spondylosis    Stroke [facility])    Past Surgical History:  Procedure Laterality Date   ANTERIOR CRUCIATE LIGAMENT REPAIR Left 2021   APPENDECTOMY     BUBBLE STUDY  08/17/2020   Procedure: BUBBLE STUDY;  Surgeon: Lonni Slain, MD;  Location: [facility] ENDOSCOPY;  Service: Cardiovascular;;   FALSE ANEURYSM REPAIR Right 08/15/2020   Procedure: REPAIR RIGHT FEMORAL PSEUDOANEURYSM;  Surgeon: Serene Gaile ORN, MD;  Location: [facility] OR;  Service: Vascular;  Laterality: Right;   IR ANGIO INTRA EXTRACRAN SEL COM CAROTID INNOMINATE UNI L MOD SED  08/14/2020   IR ANGIO INTRA EXTRACRAN SEL INTERNAL CAROTID UNI R MOD SED  08/14/2020   IR ANGIO VERTEBRAL SEL SUBCLAVIAN INNOMINATE BILAT MOD SED  08/14/2020   IR CT HEAD LTD  08/14/2020   IR CT HEAD LTD  08/14/2020   IR INTRA CRAN STENT  08/14/2020   IR INTRAVSC STENT CERV CAROTID W/O EMB-PROT MOD SED INC ANGIO  08/14/2020   IR RADIOLOGIST EVAL & MGMT  09/24/2020   PATENT FORAMEN OVALE(PFO) CLOSURE N/A 10/24/2020   Procedure: PATENT FORAMEN OVALE (PFO) CLOSURE;  Surgeon: Wonda Sharper, MD;  Location: [facility] INVASIVE CV LAB;  Service: Cardiovascular;  Laterality: N/A;   RADIOLOGY WITH ANESTHESIA N/A 08/14/2020   Procedure: IR WITH ANESTHESIA;  Surgeon: Dolphus Carrion, MD;  Location: [facility] OR;  Service: Radiology;  Laterality: N/A;    TEE WITHOUT CARDIOVERSION N/A 08/17/2020   Procedure: TRANSESOPHAGEAL ECHOCARDIOGRAM (TEE);  Surgeon: Lonni Slain, MD;  Location: [facility] ENDOSCOPY;  Service: Cardiovascular;  Laterality: N/A;   TUBAL LIGATION     Patient Active Problem List   Diagnosis Date Noted   Left low back pain 12/16/2022   Shoulder injury 06/30/2022   Diabetes mellitus without complication (HCC) 02/09/2022   Obesity (BMI 30.0-34.9) 11/12/2021   Anxiety 04/10/2021   Decreased hearing of left ear 04/10/2021   Depression 01/02/2021   Essential hypertension 01/02/2021   Right middle cerebral artery stroke (HCC) 08/22/2020   Hypothyroidism    Hyperlipidemia    PFO (patent foramen ovale)    Internal carotid artery occlusion, right 08/14/2020   Acute right MCA stroke (HCC) 08/12/2020   Cerebrovascular accident (CVA) (HCC)     PCP: de [place], [name], MD  REFERRING PROVIDER: de [place], [name], MD  REFERRING DIAG: M54.50 (ICD-10-CM) - Left low back pain, unspecified chronicity, unspecified whether sciatica present  Rationale for Evaluation and Treatment: Rehabilitation  THERAPY DIAG:  Other low back pain  Muscle weakness (generalized)  Other abnormalities of gait and mobility  Other symptoms and signs involving the musculoskeletal system  ONSET DATE: 11/2022  SUBJECTIVE:  SUBJECTIVE STATEMENT: Patient states low back and L hip pain. Has been walking more. Using [employer] instead of of hey dude shoes. Trying to stretch more. Starting to get more feeling back in left leg, was dragging her left leg more. Had LLE sensation off from stroke which has been getting better. Has to pay attention to how left leg is moving. Symptoms began in November with insidious onset. Was having left foot numbness more constant but is now not  on a daily basis. Pain around back/back of hip and wraps around front. Sitting in an uncomfortable position increases symptoms. Using rollator with walking out in the community, trying to walk at least 5x/week.   PERTINENT HISTORY:  CVA in 08/22.  Pt had surgery including a stent-Pt still has residual numbness in L foot Patent foramen ovale closure 10/24/2020 Anxiety and depression HTN, DM, and CTS on R L ACL repair 2021  PAIN:  Are you having pain? No at rest   PRECAUTIONS: None  WEIGHT BEARING RESTRICTIONS: No  FALLS:  Has patient fallen in last 6 months? No  OCCUPATION: Retired  PLOF: Independent  PATIENT GOALS: get hip/back feeling better   OBJECTIVE: (objective measures from initial evaluation unless otherwise dated)  PATIENT SURVEYS:  FOTO    SCREENING FOR RED FLAGS: Bowel or bladder incontinence: No Spinal tumors: No Cauda equina syndrome: No Compression fracture: No Abdominal aneurysm: No  COGNITION: Overall cognitive status: Within functional limits for tasks assessed     SENSATION: WFL  POSTURE: rounded shoulders, forward head, and increased thoracic kyphosis  PALPATION: TTP L glutes, L hip flexors   LUMBAR ROM:   AROM eval  Flexion 0% limited  Extension 25% limited  Right lateral flexion 0% limited  Left lateral flexion 0% limited  Right rotation 0% limited  Left rotation 0% limited   (Blank rows = not tested) * = pain/symptoms  LOWER EXTREMITY ROM:   decreased hip IR PROM, decreased bilateral hip extension AROM  Active  Right eval Left eval  Hip flexion    Hip extension    Hip abduction    Hip adduction    Hip internal rotation    Hip external rotation    Knee flexion    Knee extension    Ankle dorsiflexion    Ankle plantarflexion    Ankle inversion    Ankle eversion     (Blank rows = not tested) * = pain/symptoms  LOWER EXTREMITY MMT:    MMT Right eval Left eval  Hip flexion 4- 4*  Hip extension 3+ 3+  Hip abduction 4-  4-  Hip adduction    Hip internal rotation    Hip external rotation    Knee flexion 5 5  Knee extension 5 5  Ankle dorsiflexion 5 5  Ankle plantarflexion    Ankle inversion    Ankle eversion     (Blank rows = not tested) * = pain/symptoms   FUNCTIONAL TESTS:  5 times sit to stand: 16.49 seconds without UE use, slight shift off LLE   GAIT: Distance walked: 100 feet Assistive device utilized: None Level of assistance: Complete Independence Comments:   TODAY'S TREATMENT:  DATE:  01/14/23  EVAL and HEP   PATIENT EDUCATION:  Education details: Patient educated on exam findings, POC, scope of PT, HEP, and anatomy and mechanics. Person educated: Patient Education method: Explanation, Demonstration, and Handouts Education comprehension: verbalized understanding, returned demonstration, verbal cues required, and tactile cues required  HOME EXERCISE PROGRAM: Access Code: [email or web address] URL: [email or web address] Date: 01/14/2023 - Supine Bridge  - 2 x daily - 7 x weekly - 2 sets - 10 reps - Clamshell  - 2 x daily - 7 x weekly - 2 sets - 10 reps  ASSESSMENT:  CLINICAL IMPRESSION: Patient a 58 y.o. y.o. female who was seen today for physical therapy evaluation and treatment for LBP and L hip pain. Patient presents with pain limited deficits in LBP and L hip strength, ROM, endurance, activity tolerance, and functional mobility with ADL. Patient is having to modify and restrict ADL as indicated by outcome measure score as well as subjective information and objective measures which is affecting overall participation. Patient will benefit from skilled physical therapy in order to improve function and reduce impairment.  OBJECTIVE IMPAIRMENTS: Abnormal gait, decreased activity tolerance, decreased endurance, decreased mobility, difficulty walking, decreased  ROM, decreased strength, improper body mechanics, and pain.   ACTIVITY LIMITATIONS: carrying, lifting, bending, standing, squatting, stairs, transfers, locomotion level, and caring for others  PARTICIPATION LIMITATIONS: meal prep, cleaning, laundry, shopping, community activity, occupation, and yard work  PERSONAL FACTORS: Fitness, Time since onset of injury/illness/exacerbation, and 3+ comorbidities: hx CVA, DM, HTN  are also affecting patient's functional outcome.   REHAB POTENTIAL: Good  CLINICAL DECISION MAKING: Stable/uncomplicated  EVALUATION COMPLEXITY: Low   GOALS: Goals reviewed with patient? Yes  SHORT TERM GOALS: Target date: 02/25/2023    Patient will be independent with HEP in order to improve functional outcomes. Baseline:  Goal status: INITIAL  2.  Patient will report at least 25% improvement in symptoms for improved quality of life. Baseline:  Goal status: INITIAL    LONG TERM GOALS: Target date: 04/08/2023    Patient will report at least 75% improvement in symptoms for improved quality of life. Baseline:  Goal status: INITIAL  2.  Patient will improve FOTO score to predicted outcomes in order to indicate improved tolerance to activity. Baseline:  Goal status: INITIAL  3. Patient will demonstrate grade of 4+/5 MMT grade in all tested musculature as evidence of improved strength to assist with stair ambulation and gait.   Baseline:  Goal status: INITIAL  4.  Patient will be able to complete 5x STS in under 11.4 seconds in order to demo improved functional strength. Baseline:  Goal status: INITIAL     PLAN:  PT FREQUENCY: 1-2x/week  PT DURATION: 12 weeks  PLANNED INTERVENTIONS: 97164- PT Re-evaluation, 97110-Therapeutic exercises, 97530- Therapeutic activity, 97112- Neuromuscular re-education, 97535- Self Care, 02859- Manual therapy, [phone]- Gait training, [phone]- Orthotic Fit/training, [phone]- Canalith repositioning, [id number]- Aquatic Therapy, [phone]-  Splinting, Patient/Family education, Balance training, Stair training, Taping, Dry Needling, Joint mobilization, Joint manipulation, Spinal manipulation, Spinal mobilization, Scar mobilization, and DME instructions.  PLAN FOR NEXT SESSION: complete FOTO, f/u with HEP; glute strength, core strength, possibly manual for hip pain/mobility   Prentice RAMAN Seaton Hofmann, PT 01/14/2023, 1:01 PM

## 2023-01-15 ENCOUNTER — Other Ambulatory Visit: Payer: Self-pay | Admitting: Nurse Practitioner

## 2023-01-15 DIAGNOSIS — F419 Anxiety disorder, unspecified: Secondary | ICD-10-CM

## 2023-01-16 ENCOUNTER — Encounter (HOSPITAL_BASED_OUTPATIENT_CLINIC_OR_DEPARTMENT_OTHER): Payer: Self-pay | Admitting: Family Medicine

## 2023-01-17 ENCOUNTER — Other Ambulatory Visit (HOSPITAL_BASED_OUTPATIENT_CLINIC_OR_DEPARTMENT_OTHER): Payer: Self-pay | Admitting: Nurse Practitioner

## 2023-01-17 DIAGNOSIS — F419 Anxiety disorder, unspecified: Secondary | ICD-10-CM

## 2023-01-25 ENCOUNTER — Other Ambulatory Visit (HOSPITAL_BASED_OUTPATIENT_CLINIC_OR_DEPARTMENT_OTHER): Payer: Self-pay | Admitting: Family Medicine

## 2023-01-31 ENCOUNTER — Ambulatory Visit (HOSPITAL_BASED_OUTPATIENT_CLINIC_OR_DEPARTMENT_OTHER): Payer: Managed Care, Other (non HMO) | Admitting: Physical Therapy

## 2023-01-31 DIAGNOSIS — M5459 Other low back pain: Secondary | ICD-10-CM | POA: Diagnosis not present

## 2023-01-31 DIAGNOSIS — R2689 Other abnormalities of gait and mobility: Secondary | ICD-10-CM

## 2023-01-31 DIAGNOSIS — M6281 Muscle weakness (generalized): Secondary | ICD-10-CM

## 2023-01-31 NOTE — Therapy (Signed)
OUTPATIENT PHYSICAL THERAPY THORACOLUMBAR TREATMENT   Patient Name: Vicki Gonzales MRN: [id number] DOB:07-Oct-1965, 58 y.o., female Today's Date: 01/31/2023  END OF SESSION:  PT End of Session - 01/31/23 1140     Visit Number 2    Number of Visits 12   1-2x/week for 12 weeks   Date for PT Re-Evaluation 04/08/23    Authorization Type [employer]    Progress Note Due on Visit 10    PT Start Time 1049    PT Stop Time 1127    PT Time Calculation (min) 38 min    Activity Tolerance Patient tolerated treatment well    Behavior During Therapy WFL for tasks assessed/performed              Past Medical History:  Diagnosis Date   ADHD    Anxiety    Carpal tunnel syndrome on right    Depression    HTN (hypertension)    Hypothyroidism    Migraines    Spondylosis    Stroke [facility])    Past Surgical History:  Procedure Laterality Date   ANTERIOR CRUCIATE LIGAMENT REPAIR Left 2021   APPENDECTOMY     BUBBLE STUDY  08/17/2020   Procedure: BUBBLE STUDY;  Surgeon: Jodelle Red, MD;  Location: [facility] ENDOSCOPY;  Service: Cardiovascular;;   FALSE ANEURYSM REPAIR Right 08/15/2020   Procedure: REPAIR RIGHT FEMORAL PSEUDOANEURYSM;  Surgeon: Nada Libman, MD;  Location: [facility] OR;  Service: Vascular;  Laterality: Right;   IR ANGIO INTRA EXTRACRAN SEL COM CAROTID INNOMINATE UNI L MOD SED  08/14/2020   IR ANGIO INTRA EXTRACRAN SEL INTERNAL CAROTID UNI R MOD SED  08/14/2020   IR ANGIO VERTEBRAL SEL SUBCLAVIAN INNOMINATE BILAT MOD SED  08/14/2020   IR CT HEAD LTD  08/14/2020   IR CT HEAD LTD  08/14/2020   IR INTRA CRAN STENT  08/14/2020   IR INTRAVSC STENT CERV CAROTID W/O EMB-PROT MOD SED INC ANGIO  08/14/2020   IR RADIOLOGIST EVAL & MGMT  09/24/2020   PATENT FORAMEN OVALE(PFO) CLOSURE N/A 10/24/2020   Procedure: PATENT FORAMEN OVALE (PFO) CLOSURE;  Surgeon: Tonny Bollman, MD;  Location: [facility] INVASIVE CV LAB;  Service: Cardiovascular;  Laterality: N/A;   RADIOLOGY WITH ANESTHESIA N/A 08/14/2020    Procedure: IR WITH ANESTHESIA;  Surgeon: Julieanne Cotton, MD;  Location: [facility] OR;  Service: Radiology;  Laterality: N/A;   TEE WITHOUT CARDIOVERSION N/A 08/17/2020   Procedure: TRANSESOPHAGEAL ECHOCARDIOGRAM (TEE);  Surgeon: Jodelle Red, MD;  Location: [facility] ENDOSCOPY;  Service: Cardiovascular;  Laterality: N/A;   TUBAL LIGATION     Patient Active Problem List   Diagnosis Date Noted   Left low back pain 12/16/2022   Shoulder injury 06/30/2022   Diabetes mellitus without complication (HCC) 02/09/2022   Obesity (BMI 30.0-34.9) 11/12/2021   Anxiety 04/10/2021   Decreased hearing of left ear 04/10/2021   Depression 01/02/2021   Essential hypertension 01/02/2021   Right middle cerebral artery stroke (HCC) 08/22/2020   Hypothyroidism    Hyperlipidemia    PFO (patent foramen ovale)    Internal carotid artery occlusion, right 08/14/2020   Acute right MCA stroke (HCC) 08/12/2020   Cerebrovascular accident (CVA) (HCC)     PCP: [name], [name], MD  REFERRING PROVIDER: [name], [name], MD  REFERRING DIAG: M54.50 (ICD-10-CM) - Left low back pain, unspecified chronicity, unspecified whether sciatica present  Rationale for Evaluation and Treatment: Rehabilitation  THERAPY DIAG:  Other low back pain  Muscle weakness (generalized)  Other abnormalities of gait and mobility  ONSET DATE: 11/2022  SUBJECTIVE:                                                                                                                                                                                           SUBJECTIVE STATEMENT: Pt reports no changes since last visit.   From evaluation:  Patient states low back and L hip pain. Has been walking more. Using [employer] instead of of hey dude shoes. Trying to stretch more. Starting to get more feeling back in left leg, was dragging her left leg more. Had LLE sensation off from stroke which has been getting better. Has to pay attention to how left  leg is moving. Symptoms began in November with insidious onset. Was having left foot numbness more constant but is now not on a daily basis. Pain around back/back of hip and wraps around front. Sitting in an uncomfortable position increases symptoms. Using rollator with walking out in the community, trying to walk at least 5x/week.   PERTINENT HISTORY:  CVA in 08/22.  Pt had surgery including a stent-Pt still has residual numbness in L foot Patent foramen ovale closure 10/24/2020 Anxiety and depression HTN, DM, and CTS on R L ACL repair 2021  PAIN:  Are you having pain? None at rest, "just stiff"   PRECAUTIONS: None  WEIGHT BEARING RESTRICTIONS: No  FALLS:  Has patient fallen in last 6 months? No  OCCUPATION: Retired  PLOF: Independent  PATIENT GOALS: get hip/back feeling better   OBJECTIVE: (objective measures from initial evaluation unless otherwise dated)  PATIENT SURVEYS:  FOTO    SCREENING FOR RED FLAGS: Bowel or bladder incontinence: No Spinal tumors: No Cauda equina syndrome: No Compression fracture: No Abdominal aneurysm: No  COGNITION: Overall cognitive status: Within functional limits for tasks assessed     SENSATION: WFL  POSTURE: rounded shoulders, forward head, and increased thoracic kyphosis  PALPATION: TTP L glutes, L hip flexors   LUMBAR ROM:   AROM eval  Flexion 0% limited  Extension 25% limited  Right lateral flexion 0% limited  Left lateral flexion 0% limited  Right rotation 0% limited  Left rotation 0% limited   (Blank rows = not tested) * = pain/symptoms  LOWER EXTREMITY ROM:   decreased hip IR PROM, decreased bilateral hip extension AROM  Active  Right eval Left eval  Hip flexion    Hip extension    Hip abduction    Hip adduction    Hip internal rotation    Hip external rotation    Knee flexion    Knee extension    Ankle  dorsiflexion    Ankle plantarflexion    Ankle inversion    Ankle eversion     (Blank rows = not  tested) * = pain/symptoms  LOWER EXTREMITY MMT:    MMT Right eval Left eval  Hip flexion 4- 4*  Hip extension 3+ 3+  Hip abduction 4- 4-  Hip adduction    Hip internal rotation    Hip external rotation    Knee flexion 5 5  Knee extension 5 5  Ankle dorsiflexion 5 5  Ankle plantarflexion    Ankle inversion    Ankle eversion     (Blank rows = not tested) * = pain/symptoms   FUNCTIONAL TESTS:  5 times sit to stand: 16.49 seconds without UE use, slight shift off LLE   GAIT: Distance walked: 100 feet Assistive device utilized: None Level of assistance: Complete Independence Comments:   TODAY'S TREATMENT:                                                                                                                              DATE:  01/31/23 NuStep L5, LE only x 6.5 min for warm up and conditioning Supine bridge, 5 sec hold x 10, with core engaged - 2 sets Sidelying clams, 10 reps, 2 sets on LLE, 1 set on RLE  Sidelying hip abdct x 10 LLE  SLR with core engaged x 5 each LE STS in staggered stance (L foot back)  with eccentric lowering (5 sec) x 10  Seated piriformis stretch x 15s x 2 each LE    PATIENT EDUCATION:  Education details: exercise rationale,  HEP Person educated: Patient Education method: Programmer, multimedia, Demonstration, and Handouts Education comprehension: verbalized understanding, returned demonstration, verbal cues required, and tactile cues required  HOME EXERCISE PROGRAM: Access Code: [email or web address] URL: [email or web address] Date: 01/14/2023   ASSESSMENT:  CLINICAL IMPRESSION: Pt tolerated all exercises well, without production of pain in hip or low back. Some cues for slowing speed of exercise, for not holding breath, and technique of exercise. Pt is member of [employer]; may show her gym equipment in future sessions for HEP.  Patient will benefit from skilled physical therapy in order to improve function and reduce impairment. Goals are ongoing.      From initial eval: Patient a 58 y.o. y.o. female who was seen today for physical therapy evaluation and treatment for LBP and L hip pain. Patient presents with pain limited deficits in LBP and L hip strength, ROM, endurance, activity tolerance, and functional mobility with ADL. Patient is having to modify and restrict ADL as indicated by outcome measure score as well as subjective information and objective measures which is affecting overall participation. Patient will benefit from skilled physical therapy in order to improve function and reduce impairment.  OBJECTIVE IMPAIRMENTS: Abnormal gait, decreased activity tolerance, decreased endurance, decreased mobility, difficulty walking, decreased ROM, decreased strength, improper body mechanics, and pain.   ACTIVITY LIMITATIONS: carrying, lifting, bending, standing, squatting, stairs, transfers,  locomotion level, and caring for others  PARTICIPATION LIMITATIONS: meal prep, cleaning, laundry, shopping, community activity, occupation, and yard work  PERSONAL FACTORS: Fitness, Time since onset of injury/illness/exacerbation, and 3+ comorbidities: hx CVA, DM, HTN  are also affecting patient's functional outcome.   REHAB POTENTIAL: Good  CLINICAL DECISION MAKING: Stable/uncomplicated  EVALUATION COMPLEXITY: Low   GOALS: Goals reviewed with patient? Yes  SHORT TERM GOALS: Target date: 02/25/2023    Patient will be independent with HEP in order to improve functional outcomes. Baseline:  Goal status: INITIAL  2.  Patient will report at least 25% improvement in symptoms for improved quality of life. Baseline:  Goal status: INITIAL    LONG TERM GOALS: Target date: 04/08/2023    Patient will report at least 75% improvement in symptoms for improved quality of life. Baseline:  Goal status: INITIAL  2.  Patient will improve FOTO score to predicted outcomes in order to indicate improved tolerance to activity. Baseline:  Goal status:  INITIAL  3. Patient will demonstrate grade of 4+/5 MMT grade in all tested musculature as evidence of improved strength to assist with stair ambulation and gait.   Baseline:  Goal status: INITIAL  4.  Patient will be able to complete 5x STS in under 11.4 seconds in order to demo improved functional strength. Baseline:  Goal status: INITIAL     PLAN:  PT FREQUENCY: 1-2x/week  PT DURATION: 12 weeks  PLANNED INTERVENTIONS: 97164- PT Re-evaluation, 97110-Therapeutic exercises, 97530- Therapeutic activity, 97112- Neuromuscular re-education, 97535- Self Care, [id number]- Manual therapy, [phone]- Gait training, [phone]- Orthotic Fit/training, [phone]- Canalith repositioning, [id number]- Aquatic Therapy, [phone]- Splinting, Patient/Family education, Balance training, Stair training, Taping, Dry Needling, Joint mobilization, Joint manipulation, Spinal manipulation, Spinal mobilization, Scar mobilization, and DME instructions.  PLAN FOR NEXT SESSION: f/u with HEP; glute strength, core strength, possibly manual for hip pain/mobility  Mayer Camel, PTA 01/31/23 11:45 AM [facility] [address] [place], [place], [address] Phone: [phone]   Fax:  [phone]

## 2023-02-04 ENCOUNTER — Encounter (HOSPITAL_BASED_OUTPATIENT_CLINIC_OR_DEPARTMENT_OTHER): Payer: Self-pay | Admitting: Physical Therapy

## 2023-02-04 ENCOUNTER — Ambulatory Visit (HOSPITAL_BASED_OUTPATIENT_CLINIC_OR_DEPARTMENT_OTHER): Payer: Managed Care, Other (non HMO) | Admitting: Physical Therapy

## 2023-02-04 DIAGNOSIS — M5459 Other low back pain: Secondary | ICD-10-CM

## 2023-02-04 DIAGNOSIS — M6281 Muscle weakness (generalized): Secondary | ICD-10-CM

## 2023-02-04 DIAGNOSIS — R2689 Other abnormalities of gait and mobility: Secondary | ICD-10-CM

## 2023-02-04 NOTE — Therapy (Signed)
OUTPATIENT PHYSICAL THERAPY THORACOLUMBAR TREATMENT   Patient Name: Vicki Gonzales MRN: [id number] DOB:1965-05-25, 58 y.o., female Today's Date: 02/04/2023  END OF SESSION:  PT End of Session - 02/04/23 0953     Visit Number 3    Number of Visits 12    Date for PT Re-Evaluation 04/08/23    Authorization Type [employer]    Progress Note Due on Visit 10    PT Start Time [phone]    PT Stop Time 1025    PT Time Calculation (min) 41 min    Behavior During Therapy WFL for tasks assessed/performed              Past Medical History:  Diagnosis Date   ADHD    Anxiety    Carpal tunnel syndrome on right    Depression    HTN (hypertension)    Hypothyroidism    Migraines    Spondylosis    Stroke [facility])    Past Surgical History:  Procedure Laterality Date   ANTERIOR CRUCIATE LIGAMENT REPAIR Left 2021   APPENDECTOMY     BUBBLE STUDY  08/17/2020   Procedure: BUBBLE STUDY;  Surgeon: Jodelle Red, MD;  Location: [facility] ENDOSCOPY;  Service: Cardiovascular;;   FALSE ANEURYSM REPAIR Right 08/15/2020   Procedure: REPAIR RIGHT FEMORAL PSEUDOANEURYSM;  Surgeon: Nada Libman, MD;  Location: [facility] OR;  Service: Vascular;  Laterality: Right;   IR ANGIO INTRA EXTRACRAN SEL COM CAROTID INNOMINATE UNI L MOD SED  08/14/2020   IR ANGIO INTRA EXTRACRAN SEL INTERNAL CAROTID UNI R MOD SED  08/14/2020   IR ANGIO VERTEBRAL SEL SUBCLAVIAN INNOMINATE BILAT MOD SED  08/14/2020   IR CT HEAD LTD  08/14/2020   IR CT HEAD LTD  08/14/2020   IR INTRA CRAN STENT  08/14/2020   IR INTRAVSC STENT CERV CAROTID W/O EMB-PROT MOD SED INC ANGIO  08/14/2020   IR RADIOLOGIST EVAL & MGMT  09/24/2020   PATENT FORAMEN OVALE(PFO) CLOSURE N/A 10/24/2020   Procedure: PATENT FORAMEN OVALE (PFO) CLOSURE;  Surgeon: Tonny Bollman, MD;  Location: [facility] INVASIVE CV LAB;  Service: Cardiovascular;  Laterality: N/A;   RADIOLOGY WITH ANESTHESIA N/A 08/14/2020   Procedure: IR WITH ANESTHESIA;  Surgeon: Julieanne Cotton, MD;  Location: [facility] OR;   Service: Radiology;  Laterality: N/A;   TEE WITHOUT CARDIOVERSION N/A 08/17/2020   Procedure: TRANSESOPHAGEAL ECHOCARDIOGRAM (TEE);  Surgeon: Jodelle Red, MD;  Location: [facility] ENDOSCOPY;  Service: Cardiovascular;  Laterality: N/A;   TUBAL LIGATION     Patient Active Problem List   Diagnosis Date Noted   Left low back pain 12/16/2022   Shoulder injury 06/30/2022   Diabetes mellitus without complication (HCC) 02/09/2022   Obesity (BMI 30.0-34.9) 11/12/2021   Anxiety 04/10/2021   Decreased hearing of left ear 04/10/2021   Depression 01/02/2021   Essential hypertension 01/02/2021   Right middle cerebral artery stroke (HCC) 08/22/2020   Hypothyroidism    Hyperlipidemia    PFO (patent foramen ovale)    Internal carotid artery occlusion, right 08/14/2020   Acute right MCA stroke (HCC) 08/12/2020   Cerebrovascular accident (CVA) (HCC)     PCP: [name], [name], MD  REFERRING PROVIDER: [name], [name], MD  REFERRING DIAG: M54.50 (ICD-10-CM) - Left low back pain, unspecified chronicity, unspecified whether sciatica present  Rationale for Evaluation and Treatment: Rehabilitation  THERAPY DIAG:  Other low back pain  Muscle weakness (generalized)  Other abnormalities of gait and mobility  ONSET DATE: 11/2022  SUBJECTIVE:  SUBJECTIVE STATEMENT: Pt reports she was sore in her Lt hip yesterday, but it wasn't bad.  She complains of Lt ankle stiffness today.      From evaluation:  Patient states low back and L hip pain. Has been walking more. Using [employer] instead of of hey dude shoes. Trying to stretch more. Starting to get more feeling back in left leg, was dragging her left leg more. Had LLE sensation off from stroke which has been getting better. Has to pay attention to how left leg is  moving. Symptoms began in November with insidious onset. Was having left foot numbness more constant but is now not on a daily basis. Pain around back/back of hip and wraps around front. Sitting in an uncomfortable position increases symptoms. Using rollator with walking out in the community, trying to walk at least 5x/week.   PERTINENT HISTORY:  CVA in 08/22.  Pt had surgery including a stent-Pt still has residual numbness in L foot Patent foramen ovale closure 10/24/2020 Anxiety and depression HTN, DM, and CTS on R L ACL repair 2021  PAIN:  Are you having pain? None at rest, "just stiff"   PRECAUTIONS: None  WEIGHT BEARING RESTRICTIONS: No  FALLS:  Has patient fallen in last 6 months? No  OCCUPATION: Retired  PLOF: Independent  PATIENT GOALS: get hip/back feeling better   OBJECTIVE: (objective measures from initial evaluation unless otherwise dated)  PATIENT SURVEYS:  FOTO    SCREENING FOR RED FLAGS: Bowel or bladder incontinence: No Spinal tumors: No Cauda equina syndrome: No Compression fracture: No Abdominal aneurysm: No  COGNITION: Overall cognitive status: Within functional limits for tasks assessed     SENSATION: WFL  POSTURE: rounded shoulders, forward head, and increased thoracic kyphosis  PALPATION: TTP L glutes, L hip flexors   LUMBAR ROM:   AROM eval  Flexion 0% limited  Extension 25% limited  Right lateral flexion 0% limited  Left lateral flexion 0% limited  Right rotation 0% limited  Left rotation 0% limited   (Blank rows = not tested) * = pain/symptoms  LOWER EXTREMITY ROM:   decreased hip IR PROM, decreased bilateral hip extension AROM  Active  Right eval Left eval  Hip flexion    Hip extension    Hip abduction    Hip adduction    Hip internal rotation    Hip external rotation    Knee flexion    Knee extension    Ankle dorsiflexion    Ankle plantarflexion    Ankle inversion    Ankle eversion     (Blank rows = not tested) *  = pain/symptoms  LOWER EXTREMITY MMT:    MMT Right eval Left eval  Hip flexion 4- 4*  Hip extension 3+ 3+  Hip abduction 4- 4-  Hip adduction    Hip internal rotation    Hip external rotation    Knee flexion 5 5  Knee extension 5 5  Ankle dorsiflexion 5 5  Ankle plantarflexion    Ankle inversion    Ankle eversion     (Blank rows = not tested) * = pain/symptoms   FUNCTIONAL TESTS:  5 times sit to stand: 16.49 seconds without UE use, slight shift off LLE   GAIT: Distance walked: 100 feet Assistive device utilized: None Level of assistance: Complete Independence Comments:   TODAY'S TREATMENT:  DATE:  02/04/23 Pt seen for aquatic therapy today.  Treatment took place in water 3.5-4.75 ft in depth at the [facility] pool. Temp of water was 91.  Pt entered/exited the pool via stairs in step-to pattern independently with bilat rail.  - unsupported in 4 ft of water: * walking forward/ backward with cues for heel / toe pattern * marching forward/ backward -> with reciprocal row with rainbow hand floats  * TrA set with single hand float pull down to thighs x 10  -> same side/ opp side knee taps with floats * marching with knee taps without floats * side stepping with arm addct/ abdct -> with rainbow hand floats * farmer carry with single yellow hand float at side, walking forward/ backward * tandem gait forward/ backward (good challenge) 2 laps * UE On wall: single leg clam 2 x 5 each  * UE on rainbow hand floats:  3 way LE kick   Pt requires the buoyancy and hydrostatic pressure of water for support, and to offload joints by unweighting joint load by at least 50 % in navel deep water and by at least 75-80% in chest to neck deep water.  Viscosity of the water is needed for resistance of strengthening. Water current perturbations provides challenge to  standing balance requiring increased core activation.    01/31/23 NuStep L5, LE only x 6.5 min for warm up and conditioning Supine bridge, 5 sec hold x 10, with core engaged - 2 sets Sidelying clams, 10 reps, 2 sets on LLE, 1 set on RLE  Sidelying hip abdct x 10 LLE  SLR with core engaged x 5 each LE STS in staggered stance (L foot back)  with eccentric lowering (5 sec) x 10  Seated piriformis stretch x 15s x 2 each LE    PATIENT EDUCATION:  Education details: intro to aquatic therapy; exercise rationale,  HEP Person educated: Patient Education method: Explanation, Demonstration, and Handouts Education comprehension: verbalized understanding, returned demonstration, verbal cues required, and tactile cues required  HOME EXERCISE PROGRAM: Access Code: [email or web address] URL: [email or web address] Date: 01/14/2023  Aquatic Access Code: [email or web address] URL: [email or web address] Date: 02/04/2023 Prepared by: [facility] [address] This aquatic home exercise program from [employer] utilizes pictures from land based exercises, but has been adapted prior to lamination and issuance.     ASSESSMENT:  CLINICAL IMPRESSION: Pt demonstrates safety and independence in aquatic setting with therapsit instructing from deck. She demonstrates confidence in setting, moving throughout all depths easily.  Pt issued laminated aquatic HEP for use at [facility]. It included strengthening exercises, core engagement and balance exercises. Good tolerance for all exercises completed today; only reported fatigue at end of session.  Goals are ongoing.     Pt is member of [employer]; may show her gym equipment in future sessions for HEP.  Patient will benefit from skilled physical therapy in order to improve function and reduce impairment. Goals are ongoing.     From initial eval: Patient a 58 y.o. y.o. female who was seen today for physical therapy evaluation and treatment for LBP and L  hip pain. Patient presents with pain limited deficits in LBP and L hip strength, ROM, endurance, activity tolerance, and functional mobility with ADL. Patient is having to modify and restrict ADL as indicated by outcome measure score as well as subjective information and objective measures which is affecting overall participation. Patient will benefit from skilled physical therapy in order to improve function and reduce impairment.  OBJECTIVE IMPAIRMENTS:  Abnormal gait, decreased activity tolerance, decreased endurance, decreased mobility, difficulty walking, decreased ROM, decreased strength, improper body mechanics, and pain.   ACTIVITY LIMITATIONS: carrying, lifting, bending, standing, squatting, stairs, transfers, locomotion level, and caring for others  PARTICIPATION LIMITATIONS: meal prep, cleaning, laundry, shopping, community activity, occupation, and yard work  PERSONAL FACTORS: Fitness, Time since onset of injury/illness/exacerbation, and 3+ comorbidities: hx CVA, DM, HTN  are also affecting patient's functional outcome.   REHAB POTENTIAL: Good  CLINICAL DECISION MAKING: Stable/uncomplicated  EVALUATION COMPLEXITY: Low   GOALS: Goals reviewed with patient? Yes  SHORT TERM GOALS: Target date: 02/25/2023    Patient will be independent with HEP in order to improve functional outcomes. Baseline:  Goal status: INITIAL  2.  Patient will report at least 25% improvement in symptoms for improved quality of life. Baseline:  Goal status: INITIAL    LONG TERM GOALS: Target date: 04/08/2023    Patient will report at least 75% improvement in symptoms for improved quality of life. Baseline:  Goal status: INITIAL  2.  Patient will improve FOTO score to predicted outcomes in order to indicate improved tolerance to activity. Baseline:  Goal status: INITIAL  3. Patient will demonstrate grade of 4+/5 MMT grade in all tested musculature as evidence of improved strength to assist with  stair ambulation and gait.   Baseline:  Goal status: INITIAL  4.  Patient will be able to complete 5x STS in under 11.4 seconds in order to demo improved functional strength. Baseline:  Goal status: INITIAL     PLAN:  PT FREQUENCY: 1-2x/week  PT DURATION: 12 weeks  PLANNED INTERVENTIONS: 97164- PT Re-evaluation, 97110-Therapeutic exercises, 97530- Therapeutic activity, 97112- Neuromuscular re-education, 97535- Self Care, 28413- Manual therapy, [phone]- Gait training, [phone]- Orthotic Fit/training, [phone]- Canalith repositioning, [id number]- Aquatic Therapy, [phone]- Splinting, Patient/Family education, Balance training, Stair training, Taping, Dry Needling, Joint mobilization, Joint manipulation, Spinal manipulation, Spinal mobilization, Scar mobilization, and DME instructions.  PLAN FOR NEXT SESSION: f/u with HEP; glute strength, core strength, possibly manual for hip pain/mobility   Mayer Camel, PTA 02/04/23 1:21 PM [facility] [address] [place], [place], [address] Phone: [phone]   Fax:  [phone]

## 2023-02-11 ENCOUNTER — Ambulatory Visit (HOSPITAL_BASED_OUTPATIENT_CLINIC_OR_DEPARTMENT_OTHER): Payer: Managed Care, Other (non HMO) | Attending: Family Medicine | Admitting: Physical Therapy

## 2023-02-11 ENCOUNTER — Encounter (HOSPITAL_BASED_OUTPATIENT_CLINIC_OR_DEPARTMENT_OTHER): Payer: Self-pay | Admitting: Physical Therapy

## 2023-02-11 DIAGNOSIS — R29898 Other symptoms and signs involving the musculoskeletal system: Secondary | ICD-10-CM | POA: Diagnosis present

## 2023-02-11 DIAGNOSIS — M6281 Muscle weakness (generalized): Secondary | ICD-10-CM | POA: Diagnosis present

## 2023-02-11 DIAGNOSIS — M5459 Other low back pain: Secondary | ICD-10-CM | POA: Diagnosis present

## 2023-02-11 DIAGNOSIS — R2689 Other abnormalities of gait and mobility: Secondary | ICD-10-CM | POA: Insufficient documentation

## 2023-02-11 NOTE — Therapy (Signed)
 OUTPATIENT PHYSICAL THERAPY THORACOLUMBAR TREATMENT   Patient Name: Vicki Gonzales MRN: [id number] DOB:December 23, 1965, 58 y.o., female Today's Date: 02/11/2023  END OF SESSION:  PT End of Session - 02/11/23 1018     Visit Number 4    Number of Visits 12    Date for PT Re-Evaluation 04/08/23    Authorization Type [employer]    Progress Note Due on Visit 10    PT Start Time 1018    PT Stop Time 1100    PT Time Calculation (min) 42 min    Behavior During Therapy WFL for tasks assessed/performed              Past Medical History:  Diagnosis Date   ADHD    Anxiety    Carpal tunnel syndrome on right    Depression    HTN (hypertension)    Hypothyroidism    Migraines    Spondylosis    Stroke [facility])    Past Surgical History:  Procedure Laterality Date   ANTERIOR CRUCIATE LIGAMENT REPAIR Left 2021   APPENDECTOMY     BUBBLE STUDY  08/17/2020   Procedure: BUBBLE STUDY;  Surgeon: Lonni Slain, MD;  Location: [facility] ENDOSCOPY;  Service: Cardiovascular;;   FALSE ANEURYSM REPAIR Right 08/15/2020   Procedure: REPAIR RIGHT FEMORAL PSEUDOANEURYSM;  Surgeon: Serene Gaile ORN, MD;  [facility]: [facility] OR;  Service: Vascular;  Laterality: Right;   IR ANGIO INTRA EXTRACRAN SEL COM CAROTID INNOMINATE UNI L MOD SED  08/14/2020   IR ANGIO INTRA EXTRACRAN SEL INTERNAL CAROTID UNI R MOD SED  08/14/2020   IR ANGIO VERTEBRAL SEL SUBCLAVIAN INNOMINATE BILAT MOD SED  08/14/2020   IR CT HEAD LTD  08/14/2020   IR CT HEAD LTD  08/14/2020   IR INTRA CRAN STENT  08/14/2020   IR INTRAVSC STENT CERV CAROTID W/O EMB-PROT MOD SED INC ANGIO  08/14/2020   IR RADIOLOGIST EVAL & MGMT  09/24/2020   PATENT FORAMEN OVALE(PFO) CLOSURE N/A 10/24/2020   Procedure: PATENT FORAMEN OVALE (PFO) CLOSURE;  Surgeon: Wonda Sharper, MD;  [facility]: [facility] INVASIVE CV LAB;  Service: Cardiovascular;  Laterality: N/A;   RADIOLOGY WITH ANESTHESIA N/A 08/14/2020   Procedure: IR WITH ANESTHESIA;  Surgeon: Dolphus Carrion, MD;  Location: [facility] OR;   Service: Radiology;  Laterality: N/A;   TEE WITHOUT CARDIOVERSION N/A 08/17/2020   Procedure: TRANSESOPHAGEAL ECHOCARDIOGRAM (TEE);  Surgeon: Lonni Slain, MD;  Location: [facility] ENDOSCOPY;  Service: Cardiovascular;  Laterality: N/A;   TUBAL LIGATION     Patient Active Problem List   Diagnosis Date Noted   Left low back pain 12/16/2022   Shoulder injury 06/30/2022   Diabetes mellitus without complication (HCC) 02/09/2022   Obesity (BMI 30.0-34.9) 11/12/2021   Anxiety 04/10/2021   Decreased hearing of left ear 04/10/2021   Depression 01/02/2021   Essential hypertension 01/02/2021   Right middle cerebral artery stroke (HCC) 08/22/2020   Hypothyroidism    Hyperlipidemia    PFO (patent foramen ovale)    Internal carotid artery occlusion, right 08/14/2020   Acute right MCA stroke (HCC) 08/12/2020   Cerebrovascular accident (CVA) (HCC)     PCP: de Cuba, [name], MD  REFERRING PROVIDER: de Cuba, [name], MD  REFERRING DIAG: M54.50 (ICD-10-CM) - Left low back pain, unspecified chronicity, unspecified whether sciatica present  Rationale for Evaluation and Treatment: Rehabilitation  THERAPY DIAG:  Other low back pain  Muscle weakness (generalized)  Other abnormalities of gait and mobility  Other symptoms and signs involving the musculoskeletal  system  ONSET DATE: 11/2022  SUBJECTIVE:                                                                                                                                                                                           SUBJECTIVE STATEMENT: Pt reports went to [facility] and it all went well. Feels that she is getting stronger.    From evaluation:  Patient states low back and L hip pain. Has been walking more. Using [employer] instead of of hey dude shoes. Trying to stretch more. Starting to get more feeling back in left leg, was dragging her left leg more. Had LLE sensation off from stroke which has been getting better. Has to  pay attention to how left leg is moving. Symptoms began in November with insidious onset. Was having left foot numbness more constant but is now not on a daily basis. Pain around back/back of hip and wraps around front. Sitting in an uncomfortable position increases symptoms. Using rollator with walking out in the community, trying to walk at least 5x/week.   PERTINENT HISTORY:  CVA in 08/22.  Pt had surgery including a stent-Pt still has residual numbness in L foot Patent foramen ovale closure 10/24/2020 Anxiety and depression HTN, DM, and CTS on R L ACL repair 2021  PAIN:  Are you having pain? None at rest, just stiff   PRECAUTIONS: None  WEIGHT BEARING RESTRICTIONS: No  FALLS:  Has patient fallen in last 6 months? No  OCCUPATION: Retired  PLOF: Independent  PATIENT GOALS: get hip/back feeling better   OBJECTIVE: (objective measures from initial evaluation unless otherwise dated)  PATIENT SURVEYS:  FOTO    SCREENING FOR RED FLAGS: Bowel or bladder incontinence: No Spinal tumors: No Cauda equina syndrome: No Compression fracture: No Abdominal aneurysm: No  COGNITION: Overall cognitive status: Within functional limits for tasks assessed     SENSATION: WFL  POSTURE: rounded shoulders, forward head, and increased thoracic kyphosis  PALPATION: TTP L glutes, L hip flexors   LUMBAR ROM:   AROM eval  Flexion 0% limited  Extension 25% limited  Right lateral flexion 0% limited  Left lateral flexion 0% limited  Right rotation 0% limited  Left rotation 0% limited   (Blank rows = not tested) * = pain/symptoms  LOWER EXTREMITY ROM:   decreased hip IR PROM, decreased bilateral hip extension AROM  Active  Right eval Left eval  Hip flexion    Hip extension    Hip abduction    Hip adduction    Hip internal rotation    Hip external rotation    Knee flexion    Knee  extension    Ankle dorsiflexion    Ankle plantarflexion    Ankle inversion    Ankle eversion      (Blank rows = not tested) * = pain/symptoms  LOWER EXTREMITY MMT:    MMT Right eval Left eval  Hip flexion 4- 4*  Hip extension 3+ 3+  Hip abduction 4- 4-  Hip adduction    Hip internal rotation    Hip external rotation    Knee flexion 5 5  Knee extension 5 5  Ankle dorsiflexion 5 5  Ankle plantarflexion    Ankle inversion    Ankle eversion     (Blank rows = not tested) * = pain/symptoms   FUNCTIONAL TESTS:  5 times sit to stand: 16.49 seconds without UE use, slight shift off LLE   GAIT: Distance walked: 100 feet Assistive device utilized: None Level of assistance: Complete Independence Comments:   TODAY'S TREATMENT:                                                                                                                              DATE:  02/11/23 Supine bridge, 5 sec hold x 10, with core engaged - 2 sets Sidelying hip abduction 2 x 10 Prone hip extension 2 x 10 STS in staggered stance (L foot back)  with eccentric lowering (5 sec) 2 x 10  Lateral stepping in squat GTB 8 x 8 feet bilateral Standing hip abduction 2 x 10 bilateral  Step up 6 inch 2 x 10  Nustep level 7, LE only, 4 minutes for conditioning  02/04/23 Pt seen for aquatic therapy today.  Treatment took place in water 3.5-4.75 ft in depth at the [facility] pool. Temp of water was 91.  Pt entered/exited the pool via stairs in step-to pattern independently with bilat rail.  - unsupported in 4 ft of water: * walking forward/ backward with cues for heel / toe pattern * marching forward/ backward -> with reciprocal row with rainbow hand floats  * TrA set with single hand float pull down to thighs x 10  -> same side/ opp side knee taps with floats * marching with knee taps without floats * side stepping with arm addct/ abdct -> with rainbow hand floats * farmer carry with single yellow hand float at side, walking forward/ backward * tandem gait forward/ backward (good challenge) 2 laps *  UE On wall: single leg clam 2 x 5 each  * UE on rainbow hand floats:  3 way LE kick   Pt requires the buoyancy and hydrostatic pressure of water for support, and to offload joints by unweighting joint load by at least 50 % in navel deep water and by at least 75-80% in chest to neck deep water.  Viscosity of the water is needed for resistance of strengthening. Water current perturbations provides challenge to standing balance requiring increased core activation.    01/31/23 NuStep L5, LE only x 6.5 min  for warm up and conditioning Supine bridge, 5 sec hold x 10, with core engaged - 2 sets Sidelying clams, 10 reps, 2 sets on LLE, 1 set on RLE  Sidelying hip abdct x 10 LLE  SLR with core engaged x 5 each LE STS in staggered stance (L foot back)  with eccentric lowering (5 sec) x 10  Seated piriformis stretch x 15s x 2 each LE    PATIENT EDUCATION:  Education details: intro to aquatic therapy; exercise rationale,  HEP Person educated: Patient Education method: Explanation, Demonstration, and Handouts Education comprehension: verbalized understanding, returned demonstration, verbal cues required, and tactile cues required  HOME EXERCISE PROGRAM: Access Code: [email or web address] URL: [email or web address] Date: 01/14/2023  Aquatic Access Code: [email or web address] URL: [email or web address] Date: 02/04/2023 Prepared by: [facility] - Outpatient Rehab - [address] This aquatic home exercise program from MedBridge utilizes pictures from land based exercises, but has been adapted prior to lamination and issuance.     ASSESSMENT:  CLINICAL IMPRESSION: Patient  tolerating table core and hip strengthening exercises well. Manual assist for positioning for isolating hip abduction in sidelying. Patient will continue to benefit from physical therapy in order to improve function and reduce impairment.     Pt is member of [employer]; may show her gym equipment in future sessions for HEP.   Patient will benefit from skilled physical therapy in order to improve function and reduce impairment. Goals are ongoing.     From initial eval: Patient a 58 y.o. y.o. female who was seen today for physical therapy evaluation and treatment for LBP and L hip pain. Patient presents with pain limited deficits in LBP and L hip strength, ROM, endurance, activity tolerance, and functional mobility with ADL. Patient is having to modify and restrict ADL as indicated by outcome measure score as well as subjective information and objective measures which is affecting overall participation. Patient will benefit from skilled physical therapy in order to improve function and reduce impairment.  OBJECTIVE IMPAIRMENTS: Abnormal gait, decreased activity tolerance, decreased endurance, decreased mobility, difficulty walking, decreased ROM, decreased strength, improper body mechanics, and pain.   ACTIVITY LIMITATIONS: carrying, lifting, bending, standing, squatting, stairs, transfers, locomotion level, and caring for others  PARTICIPATION LIMITATIONS: meal prep, cleaning, laundry, shopping, community activity, occupation, and yard work  PERSONAL FACTORS: Fitness, Time since onset of injury/illness/exacerbation, and 3+ comorbidities: hx CVA, DM, HTN  are also affecting patient's functional outcome.   REHAB POTENTIAL: Good  CLINICAL DECISION MAKING: Stable/uncomplicated  EVALUATION COMPLEXITY: Low   GOALS: Goals reviewed with patient? Yes  SHORT TERM GOALS: Target date: 02/25/2023    Patient will be independent with HEP in order to improve functional outcomes. Baseline:  Goal status: INITIAL  2.  Patient will report at least 25% improvement in symptoms for improved quality of life. Baseline:  Goal status: INITIAL    LONG TERM GOALS: Target date: 04/08/2023    Patient will report at least 75% improvement in symptoms for improved quality of life. Baseline:  Goal status: INITIAL  2.  Patient will  improve FOTO score to predicted outcomes in order to indicate improved tolerance to activity. Baseline:  Goal status: INITIAL  3. Patient will demonstrate grade of 4+/5 MMT grade in all tested musculature as evidence of improved strength to assist with stair ambulation and gait.   Baseline:  Goal status: INITIAL  4.  Patient will be able to complete 5x STS in under 11.4 seconds in order to demo improved functional strength. Baseline:  Goal status: INITIAL  PLAN:  PT FREQUENCY: 1-2x/week  PT DURATION: 12 weeks  PLANNED INTERVENTIONS: 97164- PT Re-evaluation, 97110-Therapeutic exercises, 97530- Therapeutic activity, 97112- Neuromuscular re-education, 97535- Self Care, [id number]- Manual therapy, [phone]- Gait training, [phone]- Orthotic Fit/training, [phone]- Canalith repositioning, [id number]- Aquatic Therapy, [phone]- Splinting, Patient/Family education, Balance training, Stair training, Taping, Dry Needling, Joint mobilization, Joint manipulation, Spinal manipulation, Spinal mobilization, Scar mobilization, and DME instructions.  PLAN FOR NEXT SESSION: f/u with HEP; glute strength, core strength, possibly manual for hip pain/mobility   Prentice GORMAN Stains, PT 02/11/2023, 11:02 AM  [facility] [address] [place], [place], [address] Phone: [phone]   Fax:  [phone]

## 2023-02-18 ENCOUNTER — Encounter (HOSPITAL_BASED_OUTPATIENT_CLINIC_OR_DEPARTMENT_OTHER): Payer: Managed Care, Other (non HMO) | Admitting: Physical Therapy

## 2023-02-25 ENCOUNTER — Encounter (HOSPITAL_BASED_OUTPATIENT_CLINIC_OR_DEPARTMENT_OTHER): Payer: Managed Care, Other (non HMO)

## 2023-02-26 ENCOUNTER — Other Ambulatory Visit (HOSPITAL_COMMUNITY): Payer: Self-pay | Admitting: Interventional Radiology

## 2023-02-26 ENCOUNTER — Telehealth (HOSPITAL_COMMUNITY): Payer: Self-pay

## 2023-02-26 DIAGNOSIS — I771 Stricture of artery: Secondary | ICD-10-CM

## 2023-02-26 NOTE — Telephone Encounter (Signed)
Called to schedule US carotid, no answer, left vm. AB

## 2023-02-28 ENCOUNTER — Other Ambulatory Visit (HOSPITAL_BASED_OUTPATIENT_CLINIC_OR_DEPARTMENT_OTHER): Payer: Self-pay | Admitting: Family Medicine

## 2023-03-03 ENCOUNTER — Encounter (HOSPITAL_BASED_OUTPATIENT_CLINIC_OR_DEPARTMENT_OTHER): Payer: Self-pay | Admitting: Physical Therapy

## 2023-03-03 NOTE — Therapy (Signed)
 [facility] Outpatient Rehabilitation at [facility] [address] [place], [place], [address] Phone: [phone]   Fax:  [phone]  Patient Details  Name: Vicki Gonzales MRN: [id number] Date of Birth: 1965-04-01 Referring Provider:  No ref. provider found  Encounter Date: 03/03/2023  PHYSICAL THERAPY DISCHARGE SUMMARY  Visits from Start of Care: 4  Current functional level related to goals / functional outcomes: Unable to assess as patient has not returned. Per office staff, patient wishes to d/c since she is feeling better.    Remaining deficits: Unable to assess as patient has not returned   Education / Equipment: HEP   Patient agrees to discharge. Patient goals were not met. Patient is being discharged due to the patient's request.   9:07 AM, 03/03/23 Wyman Songster PT, DPT Physical Therapist at [facility] Outpatient Rehabilitation at [facility] [address] [place], [place], [address] Phone: [phone]   Fax:  [phone]

## 2023-03-04 ENCOUNTER — Encounter (HOSPITAL_BASED_OUTPATIENT_CLINIC_OR_DEPARTMENT_OTHER): Payer: Managed Care, Other (non HMO)

## 2023-03-10 ENCOUNTER — Telehealth (HOSPITAL_COMMUNITY): Payer: Self-pay

## 2023-03-10 ENCOUNTER — Ambulatory Visit (HOSPITAL_COMMUNITY)
Admission: RE | Admit: 2023-03-10 | Discharge: 2023-03-10 | Disposition: A | Discharge status: Home / Self Care | Payer: Managed Care, Other (non HMO) | Source: Ambulatory Visit | Attending: Interventional Radiology | Admitting: Interventional Radiology

## 2023-03-10 DIAGNOSIS — I771 Stricture of artery: Secondary | ICD-10-CM | POA: Diagnosis present

## 2023-03-10 NOTE — Telephone Encounter (Signed)
Pt agreed to f/u in 1 year with a US carotid. AB

## 2023-03-10 NOTE — Telephone Encounter (Signed)
Called regarding recent imaging, no answer, left vm. AB

## 2023-03-11 ENCOUNTER — Encounter (HOSPITAL_BASED_OUTPATIENT_CLINIC_OR_DEPARTMENT_OTHER): Payer: Managed Care, Other (non HMO)

## 2023-03-12 ENCOUNTER — Other Ambulatory Visit (HOSPITAL_BASED_OUTPATIENT_CLINIC_OR_DEPARTMENT_OTHER): Payer: Self-pay | Admitting: Family Medicine

## 2023-03-12 DIAGNOSIS — E039 Hypothyroidism, unspecified: Secondary | ICD-10-CM

## 2023-03-13 NOTE — Telephone Encounter (Signed)
 Verbal orders given for medication change advised pt has appt to keep that appt as we will need to recheck levels

## 2023-03-18 ENCOUNTER — Encounter (HOSPITAL_BASED_OUTPATIENT_CLINIC_OR_DEPARTMENT_OTHER): Payer: Managed Care, Other (non HMO) | Admitting: Physical Therapy

## 2023-03-25 ENCOUNTER — Encounter (HOSPITAL_BASED_OUTPATIENT_CLINIC_OR_DEPARTMENT_OTHER): Payer: Managed Care, Other (non HMO)

## 2023-04-01 ENCOUNTER — Encounter (HOSPITAL_BASED_OUTPATIENT_CLINIC_OR_DEPARTMENT_OTHER): Payer: Managed Care, Other (non HMO) | Admitting: Physical Therapy

## 2023-04-02 ENCOUNTER — Other Ambulatory Visit (HOSPITAL_BASED_OUTPATIENT_CLINIC_OR_DEPARTMENT_OTHER): Payer: Self-pay | Admitting: Family Medicine

## 2023-04-09 ENCOUNTER — Other Ambulatory Visit (HOSPITAL_BASED_OUTPATIENT_CLINIC_OR_DEPARTMENT_OTHER): Payer: Managed Care, Other (non HMO)

## 2023-04-09 ENCOUNTER — Other Ambulatory Visit (HOSPITAL_BASED_OUTPATIENT_CLINIC_OR_DEPARTMENT_OTHER): Payer: Self-pay | Admitting: Family Medicine

## 2023-04-10 LAB — LIPID PANEL
Chol/HDL Ratio: 3.5 ratio (ref 0.0–4.4)
Cholesterol, Total: 131 mg/dL (ref 100–199)
HDL: 37 mg/dL — ABNORMAL LOW (ref >39)
LDL Chol Calc (NIH): 60 mg/dL (ref 0–99)
Triglycerides: 206 mg/dL — ABNORMAL HIGH (ref 0–149)
VLDL Cholesterol Cal: 34 mg/dL (ref 5–40)

## 2023-04-10 LAB — COMPREHENSIVE METABOLIC PANEL WITH GFR
ALT: 19 IU/L (ref 0–32)
AST: 17 IU/L (ref 0–40)
Albumin: 4.6 g/dL (ref 3.8–4.9)
Alkaline Phosphatase: 90 IU/L (ref 44–121)
BUN/Creatinine Ratio: 18 (ref 9–23)
BUN: 12 mg/dL (ref 6–24)
Bilirubin Total: 0.9 mg/dL (ref 0.0–1.2)
CO2: 24 mmol/L (ref 20–29)
Calcium: 9.3 mg/dL (ref 8.7–10.2)
Chloride: 101 mmol/L (ref 96–106)
Creatinine, Ser: 0.65 mg/dL (ref 0.57–1.00)
Globulin, Total: 1.9 g/dL (ref 1.5–4.5)
Glucose: 104 mg/dL — ABNORMAL HIGH (ref 70–99)
Potassium: 4.3 mmol/L (ref 3.5–5.2)
Sodium: 139 mmol/L (ref 134–144)
Total Protein: 6.5 g/dL (ref 6.0–8.5)
eGFR: 101 mL/min/{1.73_m2} (ref >59)

## 2023-04-10 LAB — CBC WITH DIFFERENTIAL/PLATELET
Basophils Absolute: 0 10*3/uL (ref 0.0–0.2)
Basos: 1 %
EOS (ABSOLUTE): 0.2 10*3/uL (ref 0.0–0.4)
Eos: 3 %
Hematocrit: 43 % (ref 34.0–46.6)
Hemoglobin: 13.1 g/dL (ref 11.1–15.9)
Immature Grans (Abs): 0 10*3/uL (ref 0.0–0.1)
Immature Granulocytes: 0 %
Lymphocytes Absolute: 2.3 10*3/uL (ref 0.7–3.1)
Lymphs: 31 %
MCH: 27.3 pg (ref 26.6–33.0)
MCHC: 30.5 g/dL — ABNORMAL LOW (ref 31.5–35.7)
MCV: 90 fL (ref 79–97)
Monocytes Absolute: 0.6 10*3/uL (ref 0.1–0.9)
Monocytes: 8 %
Neutrophils Absolute: 4.3 10*3/uL (ref 1.4–7.0)
Neutrophils: 57 %
Platelets: 302 10*3/uL (ref 150–450)
RBC: 4.8 x10E6/uL (ref 3.77–5.28)
RDW: 13.3 % (ref 11.7–15.4)
WBC: 7.5 10*3/uL (ref 3.4–10.8)

## 2023-04-10 LAB — HEMOGLOBIN A1C
Est. average glucose Bld gHb Est-mCnc: 137 mg/dL
Hgb A1c MFr Bld: 6.4 % — ABNORMAL HIGH (ref 4.8–5.6)

## 2023-04-10 LAB — TSH: TSH: 4.05 u[IU]/mL (ref 0.450–4.500)

## 2023-04-13 ENCOUNTER — Encounter (HOSPITAL_BASED_OUTPATIENT_CLINIC_OR_DEPARTMENT_OTHER): Payer: Self-pay | Admitting: Family Medicine

## 2023-04-16 ENCOUNTER — Ambulatory Visit (INDEPENDENT_AMBULATORY_CARE_PROVIDER_SITE_OTHER): Payer: Managed Care, Other (non HMO) | Admitting: Family Medicine

## 2023-04-16 ENCOUNTER — Encounter (HOSPITAL_BASED_OUTPATIENT_CLINIC_OR_DEPARTMENT_OTHER): Payer: Self-pay | Admitting: Family Medicine

## 2023-04-16 ENCOUNTER — Other Ambulatory Visit (HOSPITAL_BASED_OUTPATIENT_CLINIC_OR_DEPARTMENT_OTHER): Payer: Self-pay | Admitting: Family Medicine

## 2023-04-16 VITALS — BP 142/102 | HR 56 | Ht 66.0 in | Wt 205.7 lb

## 2023-04-16 DIAGNOSIS — Z0000 Encounter for general adult medical examination without abnormal findings: Secondary | ICD-10-CM | POA: Diagnosis not present

## 2023-04-16 DIAGNOSIS — F419 Anxiety disorder, unspecified: Secondary | ICD-10-CM

## 2023-04-16 DIAGNOSIS — E039 Hypothyroidism, unspecified: Secondary | ICD-10-CM | POA: Diagnosis not present

## 2023-04-16 MED ORDER — METFORMIN HCL ER 500 MG PO TB24: 500.0000 mg | ORAL_TABLET | Freq: Every day | ORAL | 1 refills | Status: DC

## 2023-04-16 MED ORDER — LEVOTHYROXINE SODIUM 100 MCG PO TABS: 100.0000 ug | ORAL_TABLET | Freq: Every day | ORAL | 1 refills | Status: DC

## 2023-04-16 MED ORDER — ESCITALOPRAM OXALATE 10 MG PO TABS: 10.0000 mg | ORAL_TABLET | Freq: Every day | ORAL | 1 refills | Status: DC

## 2023-04-16 NOTE — Assessment & Plan Note (Addendum)
 Routine HCM labs reviewed. HCM reviewed/discussed. Anticipatory guidance regarding healthy weight, lifestyle and choices given. Recommend healthy diet.  Recommend approximately 150 minutes/week of moderate intensity exercise Recommend regular dental and vision exams Always use seatbelt/lap and shoulder restraints Recommend using smoke alarms and checking batteries at least twice a year Recommend using sunscreen when outside Discussed colon cancer screening recommendations, options.  Patient last had colonoscopy just under 10 years ago, she thinks with [facility] Discussed immunization recommendations

## 2023-04-16 NOTE — Patient Instructions (Signed)
  Medication Instructions:  Your physician recommends that you continue on your current medications as directed. Please refer to the Current Medication list given to you today. --If you need a refill on any your medications before your next appointment, please call your pharmacy first. If no refills are authorized on file call the office.--     Follow-Up: Your next appointment:   Your physician recommends that you schedule a follow-up appointment in: 4-6 months  with Dr. [name]  You will receive a text message or e-mail with a link to a survey about your care and experience with [place] today! We would greatly appreciate your feedback!   Thanks for letting us be apart of your health journey!!  Primary Care and Sports Medicine   Dr. Ceasar Mons Peru   We encourage you to activate your patient portal called "MyChart".  Sign up information is provided on this After Visit Summary.  MyChart is used to connect with patients for Virtual Visits (Telemedicine).  Patients are able to view lab/test results, encounter notes, upcoming appointments, etc.  Non-urgent messages can be sent to your provider as well. To learn more about what you can do with MyChart, please visit --  [email or web address].

## 2023-04-16 NOTE — Progress Notes (Signed)
 Subjective:    CC: Annual Physical Exam  HPI: Vicki Gonzales is a 58 y.o. presenting for annual physical  I reviewed the past medical history, family history, social history, surgical history, and allergies today and no changes were needed.  Please see the problem list section below in epic for further details.  Past Medical History: Past Medical History:  Diagnosis Date   ADHD    Anxiety    Carpal tunnel syndrome on right    Depression    HTN (hypertension)    Hypothyroidism    Migraines    Spondylosis    Stroke [facility])    Past Surgical History: Past Surgical History:  Procedure Laterality Date   ANTERIOR CRUCIATE LIGAMENT REPAIR Left 2021   APPENDECTOMY     BUBBLE STUDY  08/17/2020   Procedure: BUBBLE STUDY;  Surgeon: Jodelle Red, MD;  Location: [facility] ENDOSCOPY;  Service: Cardiovascular;;   FALSE ANEURYSM REPAIR Right 08/15/2020   Procedure: REPAIR RIGHT FEMORAL PSEUDOANEURYSM;  Surgeon: Nada Libman, MD;  Location: [facility] OR;  Service: Vascular;  Laterality: Right;   IR ANGIO INTRA EXTRACRAN SEL COM CAROTID INNOMINATE UNI L MOD SED  08/14/2020   IR ANGIO INTRA EXTRACRAN SEL INTERNAL CAROTID UNI R MOD SED  08/14/2020   IR ANGIO VERTEBRAL SEL SUBCLAVIAN INNOMINATE BILAT MOD SED  08/14/2020   IR CT HEAD LTD  08/14/2020   IR CT HEAD LTD  08/14/2020   IR INTRA CRAN STENT  08/14/2020   IR INTRAVSC STENT CERV CAROTID W/O EMB-PROT MOD SED INC ANGIO  08/14/2020   IR RADIOLOGIST EVAL & MGMT  09/24/2020   PATENT FORAMEN OVALE(PFO) CLOSURE N/A 10/24/2020   Procedure: PATENT FORAMEN OVALE (PFO) CLOSURE;  Surgeon: Tonny Bollman, MD;  [facility]: [facility] INVASIVE CV LAB;  Service: Cardiovascular;  Laterality: N/A;   RADIOLOGY WITH ANESTHESIA N/A 08/14/2020   Procedure: IR WITH ANESTHESIA;  Surgeon: Julieanne Cotton, MD;  Location: [facility] OR;  Service: Radiology;  Laterality: N/A;   TEE WITHOUT CARDIOVERSION N/A 08/17/2020   Procedure: TRANSESOPHAGEAL ECHOCARDIOGRAM (TEE);  Surgeon: Jodelle Red, MD;  Location: [facility] ENDOSCOPY;  Service: Cardiovascular;  Laterality: N/A;   TUBAL LIGATION     Social History: Social History   Socioeconomic History   Marital status: Married    Spouse name: Not on file   Number of children: 5   Years of education: 12   Highest education level: 12th grade  Occupational History   Occupation: Retired  Tobacco Use   Smoking status: Former   Smokeless tobacco: Never   Tobacco comments:    Quit smoking in her 20's.  Substance and Sexual Activity   Alcohol use: Not Currently   Drug use: Never   Sexual activity: Not on file  Other Topics Concern   Not on file  Social History Narrative   Lives with family.   Right-handed.   Caffeine use: one cup coffee each morning.   Social Drivers of Corporate investment banker Strain: Low Risk  (04/09/2023)   Overall Financial Resource Strain (CARDIA)    Difficulty of Paying Living Expenses: Not very hard  Food Insecurity: No Food Insecurity (04/09/2023)   Hunger Vital Sign    Worried About Running Out of Food in the Last Year: Never true    Ran Out of Food in the Last Year: Never true  Transportation Needs: No Transportation Needs (04/09/2023)   PRAPARE - Administrator, Civil Service (Medical): No    Lack of Transportation (  Non-Medical): No  Physical Activity: Insufficiently Active (04/09/2023)   Exercise Vital Sign    Days of Exercise per Week: 5 days    Minutes of Exercise per Session: 20 min  Stress: Stress Concern Present (04/09/2023)   [employer] of Occupational Health - Occupational Stress Questionnaire    Feeling of Stress : To some extent  Social Connections: Socially Integrated (04/09/2023)   Social Connection and Isolation Panel [NHANES]    Frequency of Communication with Friends and Family: More than three times a week    Frequency of Social Gatherings with Friends and Family: Twice a week    Attends Religious Services: More than 4 times per year    Active Member of  [employer] or Organizations: Yes    Attends Engineer, structural: More than 4 times per year    Marital Status: Married   Family History: Family History  Problem Relation Age of Onset   Dementia Mother    Congestive Heart Failure Father    Breast cancer Paternal Aunt    Allergies: Allergies  Allergen Reactions   Keflex [Cephalexin] Itching   Medications: See med rec.  Review of Systems: No headache, visual changes, nausea, vomiting, diarrhea, constipation, dizziness, abdominal pain, skin rash, fevers, chills, night sweats, swollen lymph nodes, weight loss, chest pain, body aches, joint swelling, muscle aches, shortness of breath, mood changes, visual or auditory hallucinations.  Objective:    BP (!) 142/102 (BP Location: Right Arm, Patient Position: Sitting, Cuff Size: Large)   Pulse (!) 56   Ht 5\' 6"  (1.676 m)   Wt 205 lb 11.2 oz (93.3 kg)   LMP 04/07/2019 (Approximate)   SpO2 100%   BMI 33.20 kg/m   General: Well Developed, well nourished, and in no acute distress. Neuro: Alert and oriented x3, extra-ocular muscles intact, sensation grossly intact. Cranial nerves II through XII are intact, motor, sensory, and coordinative functions are all intact. HEENT: Normocephalic, atraumatic, pupils equal round reactive to light, neck supple, no masses, no lymphadenopathy, thyroid nonpalpable. Oropharynx, nasopharynx, external ear canals are unremarkable. Skin: Warm and dry, no rashes noted. Cardiac: Regular rate and rhythm, no murmurs rubs or gallops. Respiratory: Clear to auscultation bilaterally. Not using accessory muscles, speaking in full sentences. Abdominal: Soft, nontender, nondistended, positive bowel sounds, no masses, no organomegaly. Musculoskeletal: Shoulder, elbow, wrist, hip, knee, ankle stable, and with full range of motion.  Impression and Recommendations:    Wellness examination Assessment & Plan: Routine HCM labs reviewed. HCM reviewed/discussed. Anticipatory  guidance regarding healthy weight, lifestyle and choices given. Recommend healthy diet.  Recommend approximately 150 minutes/week of moderate intensity exercise Recommend regular dental and vision exams Always use seatbelt/lap and shoulder restraints Recommend using smoke alarms and checking batteries at least twice a year Recommend using sunscreen when outside Discussed colon cancer screening recommendations, options.  Patient last had colonoscopy just under 10 years ago, she thinks with [facility] Discussed immunization recommendations   Hypothyroidism, unspecified type Assessment & Plan: Patient has been taking levothyroxine at 100 mcg.  Clinically, patient is euthyroid.  Recent TSH at goal.  Can continue with current dose of levothyroxine at this time.  Orders: -     Levothyroxine Sodium; Take 1 tablet (100 mcg total) by mouth daily.  Dispense: 90 tablet; Refill: 1  Anxiety -     Escitalopram Oxalate; Take 1 tablet (10 mg total) by mouth daily.  Dispense: 90 tablet; Refill: 1  Other orders -     metFORMIN HCl ER; Take 1 tablet (  500 mg total) by mouth daily with breakfast.  Dispense: 90 tablet; Refill: 1  Return in about 6 months (around 10/16/2023) for diabetes, hypertension, med check.   ___________________________________________ Mida Cory [name], MD, ABFM, CAQSM Primary Care and Sports Medicine [facility]

## 2023-04-16 NOTE — Assessment & Plan Note (Signed)
 Patient has been taking levothyroxine at 100 mcg.  Clinically, patient is euthyroid.  Recent TSH at goal.  Can continue with current dose of levothyroxine at this time.

## 2023-05-04 ENCOUNTER — Other Ambulatory Visit (HOSPITAL_BASED_OUTPATIENT_CLINIC_OR_DEPARTMENT_OTHER): Payer: Self-pay | Admitting: Family Medicine

## 2023-06-02 ENCOUNTER — Other Ambulatory Visit (HOSPITAL_BASED_OUTPATIENT_CLINIC_OR_DEPARTMENT_OTHER): Payer: Self-pay | Admitting: Family Medicine

## 2023-06-20 ENCOUNTER — Other Ambulatory Visit (HOSPITAL_BASED_OUTPATIENT_CLINIC_OR_DEPARTMENT_OTHER): Payer: Self-pay | Admitting: Family Medicine

## 2023-07-03 ENCOUNTER — Encounter (HOSPITAL_COMMUNITY): Payer: Self-pay | Admitting: Interventional Radiology

## 2023-07-09 ENCOUNTER — Other Ambulatory Visit (HOSPITAL_BASED_OUTPATIENT_CLINIC_OR_DEPARTMENT_OTHER): Payer: Self-pay | Admitting: Family Medicine

## 2023-07-26 ENCOUNTER — Other Ambulatory Visit (HOSPITAL_BASED_OUTPATIENT_CLINIC_OR_DEPARTMENT_OTHER): Payer: Self-pay | Admitting: Family Medicine

## 2023-07-26 DIAGNOSIS — E785 Hyperlipidemia, unspecified: Secondary | ICD-10-CM

## 2023-08-09 ENCOUNTER — Other Ambulatory Visit (HOSPITAL_BASED_OUTPATIENT_CLINIC_OR_DEPARTMENT_OTHER): Payer: Self-pay | Admitting: Family Medicine

## 2023-09-07 ENCOUNTER — Other Ambulatory Visit (HOSPITAL_BASED_OUTPATIENT_CLINIC_OR_DEPARTMENT_OTHER): Payer: Self-pay | Admitting: Family Medicine

## 2023-09-17 LAB — HM COLONOSCOPY

## 2023-09-23 ENCOUNTER — Encounter (HOSPITAL_BASED_OUTPATIENT_CLINIC_OR_DEPARTMENT_OTHER): Payer: Self-pay | Admitting: *Deleted

## 2023-10-06 ENCOUNTER — Other Ambulatory Visit (HOSPITAL_BASED_OUTPATIENT_CLINIC_OR_DEPARTMENT_OTHER): Payer: Self-pay | Admitting: Family Medicine

## 2023-10-07 ENCOUNTER — Other Ambulatory Visit (HOSPITAL_BASED_OUTPATIENT_CLINIC_OR_DEPARTMENT_OTHER): Payer: Self-pay | Admitting: Family Medicine

## 2023-10-16 ENCOUNTER — Ambulatory Visit (HOSPITAL_BASED_OUTPATIENT_CLINIC_OR_DEPARTMENT_OTHER): Admitting: Family Medicine

## 2023-10-16 ENCOUNTER — Encounter (HOSPITAL_BASED_OUTPATIENT_CLINIC_OR_DEPARTMENT_OTHER): Payer: Self-pay | Admitting: Family Medicine

## 2023-10-16 VITALS — BP 156/82 | HR 51 | Ht 66.0 in | Wt 203.0 lb

## 2023-10-16 DIAGNOSIS — E785 Hyperlipidemia, unspecified: Secondary | ICD-10-CM | POA: Diagnosis not present

## 2023-10-16 DIAGNOSIS — F32A Depression, unspecified: Secondary | ICD-10-CM

## 2023-10-16 DIAGNOSIS — Z23 Encounter for immunization: Secondary | ICD-10-CM | POA: Diagnosis not present

## 2023-10-16 DIAGNOSIS — E119 Type 2 diabetes mellitus without complications: Secondary | ICD-10-CM | POA: Diagnosis not present

## 2023-10-16 DIAGNOSIS — Z0000 Encounter for general adult medical examination without abnormal findings: Secondary | ICD-10-CM

## 2023-10-16 DIAGNOSIS — I10 Essential (primary) hypertension: Secondary | ICD-10-CM | POA: Diagnosis not present

## 2023-10-16 DIAGNOSIS — Z7984 Long term (current) use of oral hypoglycemic drugs: Secondary | ICD-10-CM

## 2023-10-16 LAB — POCT GLYCOSYLATED HEMOGLOBIN (HGB A1C)
HbA1c POC (<> result, manual entry): 6.2 % (ref 4.0–5.6)
HbA1c, POC (controlled diabetic range): 6.2 % (ref 0.0–7.0)
HbA1c, POC (prediabetic range): 6.2 % (ref 5.7–6.4)
Hemoglobin A1C: 6.2 % — AB (ref 4.0–5.6)

## 2023-10-16 NOTE — Assessment & Plan Note (Signed)
 Has been doing well with Lexapro  thus far, requesting refill today Given good control of symptoms, feel that continuing with current dose is reasonable, refill sent to pharmacy on file

## 2023-10-16 NOTE — Assessment & Plan Note (Signed)
 Hemoglobin A1c has been at goal.  No reported issues with polyuria or polydipsia.  She continues with metformin , indicates that she has been tolerating medication well.  Denies any GI symptoms or side effects. Urine microalbumin/creatinine ratio to be completed today Foot exam today - documented We will continue with metformin  at current dose, no changes made today.  We will check hemoglobin A1c today for monitoring

## 2023-10-16 NOTE — Assessment & Plan Note (Signed)
 Patient continues with rosuvastatin  20 mg.  Denies any issues with medication, no reported myalgias.  Prior cholesterol panel has been controlled. Plan continue current medication regimen, no changes today.  Will plan to recheck cholesterol panel with labs before physical

## 2023-10-16 NOTE — Progress Notes (Signed)
    Procedures performed today:    None.  Independent interpretation of notes and tests performed by another provider:   None.  Brief History, Exam, Impression, and Recommendations:    BP (!) 156/82 (BP Location: Left Arm, Patient Position: Sitting, Cuff Size: Large)   Pulse (!) 51   Ht 5' 6 (1.676 m)   Wt 203 lb (92.1 kg)   LMP 04/07/2019 (Approximate)   SpO2 98%   BMI 32.77 kg/m   Diabetes mellitus without complication (HCC) Assessment & Plan: Hemoglobin A1c has been at goal.  No reported issues with polyuria or polydipsia.  She continues with metformin , indicates that she has been tolerating medication well.  Denies any GI symptoms or side effects. Urine microalbumin/creatinine ratio to be completed today Foot exam today - documented We will continue with metformin  at current dose, no changes made today.  We will check hemoglobin A1c today for monitoring  Orders: -     POCT glycosylated hemoglobin (Hb A1C) -     Microalbumin / creatinine urine ratio  Encounter for immunization -     Flu vaccine trivalent PF, 6mos and older(Flulaval,Afluria,Fluarix,Fluzone)  Wellness examination -     CBC with Differential/Platelet; Future -     Comprehensive metabolic panel with GFR; Future -     Hemoglobin A1c; Future -     Lipid panel; Future -     TSH; Future  Hyperlipidemia, unspecified hyperlipidemia type Assessment & Plan: Patient continues with rosuvastatin  20 mg.  Denies any issues with medication, no reported myalgias.  Prior cholesterol panel has been controlled. Plan continue current medication regimen, no changes today.  Will plan to recheck cholesterol panel with labs before physical   Essential hypertension Assessment & Plan: Blood pressure borderline in office today, she continues with lisinopril  at low-dose.  Denies any issues with medication. Given prior to control of blood pressure, can continue with current medication regimen.  Advised to monitor blood  pressure intermittently at home, focus on DASH diet.  If blood pressure continues to be borderline or is elevated, could consider slight increase in dose of lisinopril .   Depression, unspecified depression type Assessment & Plan: Has been doing well with Lexapro  thus far, requesting refill today Given good control of symptoms, feel that continuing with current dose is reasonable, refill sent to pharmacy on file   Return in about 6 months (around 04/15/2024) for CPE with fasting labs 1 week prior.   ___________________________________________ Missi Mcmackin [name], MD, ABFM, CAQSM Primary Care and Sports Medicine [facility]

## 2023-10-16 NOTE — Patient Instructions (Signed)
  Medication Instructions:  Your physician recommends that you continue on your current medications as directed. Please refer to the Current Medication list given to you today. --If you need a refill on any your medications before your next appointment, please call your pharmacy first. If no refills are authorized on file call the office.-- Lab Work: Your physician has recommended that you have lab work today: 1 week before next visit  If you have labs (blood work) drawn today and your tests are completely normal, you will receive your results via MyChart message OR a phone call from our staff.  Please ensure you check your voicemail in the event that you authorized detailed messages to be left on a delegated number. If you have any lab test that is abnormal or we need to change your treatment, we will call you to review the results.   Follow-Up: Your next appointment:   Your physician recommends that you schedule a follow-up appointment in: 6 months physical with Dr. [name]  You will receive a text message or e-mail with a link to a survey about your care and experience with [place] today! We would greatly appreciate your feedback!   Thanks for letting us be apart of your health journey!!  Primary Care and Sports Medicine   Dr. Ceasar Mons [place]   We encourage you to activate your patient portal called "MyChart".  Sign up information is provided on this After Visit Summary.  MyChart is used to connect with patients for Virtual Visits (Telemedicine).  Patients are able to view lab/test results, encounter notes, upcoming appointments, etc.  Non-urgent messages can be sent to your provider as well. To learn more about what you can do with MyChart, please visit --  [email or web address].

## 2023-10-16 NOTE — Assessment & Plan Note (Signed)
 Blood pressure borderline in office today, she continues with lisinopril  at low-dose.  Denies any issues with medication. Given prior to control of blood pressure, can continue with current medication regimen.  Advised to monitor blood pressure intermittently at home, focus on DASH diet.  If blood pressure continues to be borderline or is elevated, could consider slight increase in dose of lisinopril .

## 2023-10-17 LAB — MICROALBUMIN / CREATININE URINE RATIO
Creatinine, Urine: 22.6 mg/dL
Microalb/Creat Ratio: 43 mg/g{creat} — ABNORMAL HIGH (ref 0–29)
Microalbumin, Urine: 9.7 ug/mL

## 2023-10-20 ENCOUNTER — Ambulatory Visit (HOSPITAL_BASED_OUTPATIENT_CLINIC_OR_DEPARTMENT_OTHER): Payer: Self-pay | Admitting: Family Medicine

## 2023-11-03 ENCOUNTER — Other Ambulatory Visit (HOSPITAL_BASED_OUTPATIENT_CLINIC_OR_DEPARTMENT_OTHER): Payer: Self-pay | Admitting: Family Medicine

## 2023-11-30 ENCOUNTER — Other Ambulatory Visit (HOSPITAL_BASED_OUTPATIENT_CLINIC_OR_DEPARTMENT_OTHER): Payer: Self-pay | Admitting: Family Medicine

## 2023-12-14 ENCOUNTER — Other Ambulatory Visit (HOSPITAL_BASED_OUTPATIENT_CLINIC_OR_DEPARTMENT_OTHER): Payer: Self-pay | Admitting: Family Medicine

## 2023-12-26 ENCOUNTER — Other Ambulatory Visit (HOSPITAL_BASED_OUTPATIENT_CLINIC_OR_DEPARTMENT_OTHER): Payer: Self-pay | Admitting: Family Medicine

## 2023-12-26 DIAGNOSIS — F419 Anxiety disorder, unspecified: Secondary | ICD-10-CM

## 2023-12-28 ENCOUNTER — Other Ambulatory Visit (HOSPITAL_BASED_OUTPATIENT_CLINIC_OR_DEPARTMENT_OTHER): Payer: Self-pay | Admitting: Family Medicine

## 2024-01-01 ENCOUNTER — Other Ambulatory Visit (HOSPITAL_BASED_OUTPATIENT_CLINIC_OR_DEPARTMENT_OTHER): Payer: Self-pay | Admitting: Family Medicine

## 2024-01-01 DIAGNOSIS — E039 Hypothyroidism, unspecified: Secondary | ICD-10-CM

## 2024-01-10 ENCOUNTER — Other Ambulatory Visit (HOSPITAL_BASED_OUTPATIENT_CLINIC_OR_DEPARTMENT_OTHER): Payer: Self-pay | Admitting: Family Medicine

## 2024-01-10 DIAGNOSIS — E785 Hyperlipidemia, unspecified: Secondary | ICD-10-CM

## 2024-01-24 ENCOUNTER — Other Ambulatory Visit (HOSPITAL_BASED_OUTPATIENT_CLINIC_OR_DEPARTMENT_OTHER): Payer: Self-pay | Admitting: Family Medicine

## 2024-04-11 ENCOUNTER — Ambulatory Visit (HOSPITAL_BASED_OUTPATIENT_CLINIC_OR_DEPARTMENT_OTHER)

## 2024-04-18 ENCOUNTER — Encounter (HOSPITAL_BASED_OUTPATIENT_CLINIC_OR_DEPARTMENT_OTHER): Admitting: Family Medicine
